# Patient Record
Sex: Male | Born: 1980 | Race: White | Hispanic: No | Marital: Single | Smoking: Never smoker
Health system: Southern US, Community
[De-identification: ages and names within clinical notes are randomized; demographics above are authoritative.]

## PROBLEM LIST (undated history)

## (undated) ENCOUNTER — Ambulatory Visit

## (undated) ENCOUNTER — Encounter: Payer: PRIVATE HEALTH INSURANCE | Attending: Psychosomatic Medicine | Primary: Psychosomatic Medicine

## (undated) ENCOUNTER — Encounter

## (undated) ENCOUNTER — Encounter: Attending: Psychosomatic Medicine | Primary: Psychosomatic Medicine

## (undated) ENCOUNTER — Non-Acute Institutional Stay: Payer: PRIVATE HEALTH INSURANCE

## (undated) ENCOUNTER — Telehealth

## (undated) ENCOUNTER — Encounter
Payer: PRIVATE HEALTH INSURANCE | Attending: Orthopaedic Surgery of the Spine | Primary: Orthopaedic Surgery of the Spine

## (undated) ENCOUNTER — Encounter: Attending: Internal Medicine | Primary: Internal Medicine

## (undated) ENCOUNTER — Ambulatory Visit: Attending: Pharmacist | Primary: Pharmacist

## (undated) ENCOUNTER — Encounter
Attending: Student in an Organized Health Care Education/Training Program | Primary: Student in an Organized Health Care Education/Training Program

## (undated) ENCOUNTER — Ambulatory Visit: Payer: PRIVATE HEALTH INSURANCE

## (undated) ENCOUNTER — Encounter: Attending: Psychiatric/Mental Health | Primary: Psychiatric/Mental Health

## (undated) ENCOUNTER — Encounter: Payer: PRIVATE HEALTH INSURANCE | Attending: Psychiatric/Mental Health | Primary: Psychiatric/Mental Health

## (undated) ENCOUNTER — Encounter: Payer: PRIVATE HEALTH INSURANCE | Attending: Pharmacist | Primary: Pharmacist

## (undated) ENCOUNTER — Telehealth
Attending: Student in an Organized Health Care Education/Training Program | Primary: Student in an Organized Health Care Education/Training Program

## (undated) DIAGNOSIS — F32A Depression, unspecified: Secondary | ICD-10-CM

## (undated) DIAGNOSIS — F419 Anxiety disorder, unspecified: Secondary | ICD-10-CM

## (undated) DIAGNOSIS — F329 Major depressive disorder, single episode, unspecified: Secondary | ICD-10-CM

## (undated) DIAGNOSIS — K859 Acute pancreatitis without necrosis or infection, unspecified: Secondary | ICD-10-CM

## (undated) DIAGNOSIS — F1010 Alcohol abuse, uncomplicated: Secondary | ICD-10-CM

## (undated) HISTORY — PX: CHOLECYSTECTOMY: SHX55

## (undated) HISTORY — PX: NASAL SINUS SURGERY: SHX719

---
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
## 2013-10-01 ENCOUNTER — Encounter (HOSPITAL_COMMUNITY): Payer: Self-pay | Admitting: Emergency Medicine

## 2013-10-01 ENCOUNTER — Inpatient Hospital Stay (HOSPITAL_COMMUNITY)
Admission: EM | Admit: 2013-10-01 | Discharge: 2013-10-05 | DRG: 439 | Disposition: A | Discharge status: Home / Self Care | Payer: Self-pay | Attending: Family Medicine | Admitting: Family Medicine

## 2013-10-01 ENCOUNTER — Emergency Department (HOSPITAL_COMMUNITY): Payer: BC Managed Care – PPO

## 2013-10-01 DIAGNOSIS — F319 Bipolar disorder, unspecified: Secondary | ICD-10-CM

## 2013-10-01 DIAGNOSIS — F1010 Alcohol abuse, uncomplicated: Secondary | ICD-10-CM

## 2013-10-01 DIAGNOSIS — K859 Acute pancreatitis without necrosis or infection, unspecified: Principal | ICD-10-CM

## 2013-10-01 DIAGNOSIS — Z5900 Homelessness unspecified: Secondary | ICD-10-CM

## 2013-10-01 DIAGNOSIS — K861 Other chronic pancreatitis: Secondary | ICD-10-CM

## 2013-10-01 DIAGNOSIS — R109 Unspecified abdominal pain: Secondary | ICD-10-CM

## 2013-10-01 DIAGNOSIS — F1021 Alcohol dependence, in remission: Secondary | ICD-10-CM

## 2013-10-01 DIAGNOSIS — Z23 Encounter for immunization: Secondary | ICD-10-CM

## 2013-10-01 DIAGNOSIS — K5289 Other specified noninfective gastroenteritis and colitis: Secondary | ICD-10-CM | POA: Diagnosis present

## 2013-10-01 DIAGNOSIS — F1110 Opioid abuse, uncomplicated: Secondary | ICD-10-CM

## 2013-10-01 DIAGNOSIS — F332 Major depressive disorder, recurrent severe without psychotic features: Secondary | ICD-10-CM | POA: Diagnosis present

## 2013-10-01 HISTORY — DX: Cystic fibrosis, unspecified: E84.9

## 2013-10-01 HISTORY — DX: Acute pancreatitis without necrosis or infection, unspecified: K85.90

## 2013-10-01 HISTORY — DX: Alcohol abuse, uncomplicated: F10.10

## 2013-10-01 LAB — CBC WITH DIFFERENTIAL/PLATELET
Basophils Absolute: 0 10*3/uL (ref 0.0–0.1)
Basophils Relative: 0 % (ref 0–1)
EOS ABS: 0.1 10*3/uL (ref 0.0–0.7)
EOS PCT: 0 % (ref 0–5)
HEMATOCRIT: 41.3 % (ref 39.0–52.0)
Hemoglobin: 14.8 g/dL (ref 13.0–17.0)
LYMPHS ABS: 1.6 10*3/uL (ref 0.7–4.0)
LYMPHS PCT: 14 % (ref 12–46)
MCH: 31.8 pg (ref 26.0–34.0)
MCHC: 35.8 g/dL (ref 30.0–36.0)
MCV: 88.6 fL (ref 78.0–100.0)
MONO ABS: 0.5 10*3/uL (ref 0.1–1.0)
Monocytes Relative: 4 % (ref 3–12)
Neutro Abs: 9.6 10*3/uL — ABNORMAL HIGH (ref 1.7–7.7)
Neutrophils Relative %: 82 % — ABNORMAL HIGH (ref 43–77)
Platelets: 222 10*3/uL (ref 150–400)
RBC: 4.66 MIL/uL (ref 4.22–5.81)
RDW: 12.4 % (ref 11.5–15.5)
WBC: 11.6 10*3/uL — ABNORMAL HIGH (ref 4.0–10.5)

## 2013-10-01 LAB — URINALYSIS, ROUTINE W REFLEX MICROSCOPIC
Bilirubin Urine: NEGATIVE
Glucose, UA: 500 mg/dL — AB
HGB URINE DIPSTICK: NEGATIVE
Ketones, ur: NEGATIVE mg/dL
NITRITE: NEGATIVE
PROTEIN: NEGATIVE mg/dL
SPECIFIC GRAVITY, URINE: 1.026 (ref 1.005–1.030)
UROBILINOGEN UA: 1 mg/dL (ref 0.0–1.0)
pH: 6.5 (ref 5.0–8.0)

## 2013-10-01 LAB — COMPREHENSIVE METABOLIC PANEL
ALT: 25 U/L (ref 0–53)
AST: 22 U/L (ref 0–37)
Albumin: 4.3 g/dL (ref 3.5–5.2)
Alkaline Phosphatase: 133 U/L — ABNORMAL HIGH (ref 39–117)
BUN: 7 mg/dL (ref 6–23)
CO2: 24 meq/L (ref 19–32)
CREATININE: 0.71 mg/dL (ref 0.50–1.35)
Calcium: 9.4 mg/dL (ref 8.4–10.5)
Chloride: 100 mEq/L (ref 96–112)
GLUCOSE: 206 mg/dL — AB (ref 70–99)
Potassium: 4 mEq/L (ref 3.7–5.3)
SODIUM: 137 meq/L (ref 137–147)
Total Bilirubin: 0.7 mg/dL (ref 0.3–1.2)
Total Protein: 7.4 g/dL (ref 6.0–8.3)

## 2013-10-01 LAB — LIPASE, BLOOD: LIPASE: 7 U/L — AB (ref 11–59)

## 2013-10-01 LAB — URINE MICROSCOPIC-ADD ON

## 2013-10-01 MED ORDER — ACETAMINOPHEN 325 MG PO TABS: 650.0000 mg | ORAL_TABLET | Freq: Four times a day (QID) | ORAL | Status: DC | PRN

## 2013-10-01 MED ORDER — HYDROMORPHONE HCL PF 1 MG/ML IJ SOLN
1.0000 mg | INTRAMUSCULAR | Status: DC | PRN
  Administered 2013-10-02 – 2013-10-05 (×28): 1 mg via INTRAVENOUS
  Filled 2013-10-01 (×28): qty 1

## 2013-10-01 MED ORDER — HYDROMORPHONE HCL PF 1 MG/ML IJ SOLN
1.0000 mg | Freq: Once | INTRAMUSCULAR | Status: AC
  Administered 2013-10-01: 1 mg via INTRAVENOUS
  Filled 2013-10-01: qty 1

## 2013-10-01 MED ORDER — SODIUM CHLORIDE 0.9 % IV BOLUS (SEPSIS)
1000.0000 mL | Freq: Once | INTRAVENOUS | Status: AC
  Administered 2013-10-01: 1000 mL via INTRAVENOUS

## 2013-10-01 MED ORDER — PROMETHAZINE HCL 25 MG/ML IJ SOLN
12.5000 mg | Freq: Once | INTRAMUSCULAR | Status: AC
  Administered 2013-10-01: 12.5 mg via INTRAVENOUS
  Filled 2013-10-01: qty 1

## 2013-10-01 MED ORDER — HYDROMORPHONE HCL PF 1 MG/ML IJ SOLN: 1.0000 mg | Freq: Once | INTRAMUSCULAR | Status: DC

## 2013-10-01 MED ORDER — ONDANSETRON HCL 4 MG PO TABS: 4.0000 mg | ORAL_TABLET | Freq: Four times a day (QID) | ORAL | Status: DC | PRN

## 2013-10-01 MED ORDER — ONDANSETRON HCL 4 MG/2ML IJ SOLN
4.0000 mg | Freq: Once | INTRAMUSCULAR | Status: AC
  Administered 2013-10-01: 4 mg via INTRAVENOUS
  Filled 2013-10-01: qty 2

## 2013-10-01 MED ORDER — ONDANSETRON HCL 4 MG/2ML IJ SOLN
4.0000 mg | Freq: Four times a day (QID) | INTRAMUSCULAR | Status: DC | PRN
  Filled 2013-10-01: qty 2

## 2013-10-01 MED ORDER — ONDANSETRON HCL 4 MG/2ML IJ SOLN: 4.0000 mg | Freq: Once | INTRAMUSCULAR | Status: DC

## 2013-10-01 MED ORDER — SODIUM CHLORIDE 0.9 % IV BOLUS (SEPSIS): 1000.0000 mL | Freq: Once | INTRAVENOUS | Status: DC

## 2013-10-01 MED ORDER — SODIUM CHLORIDE 0.9 % IV SOLN
INTRAVENOUS | Status: AC
  Administered 2013-10-02: via INTRAVENOUS

## 2013-10-01 MED ORDER — ACETAMINOPHEN 650 MG RE SUPP: 650.0000 mg | Freq: Four times a day (QID) | RECTAL | Status: DC | PRN

## 2013-10-01 NOTE — ED Provider Notes (Signed)
  This was a shared visit with a mid-level provided (NP or PA).  Throughout the patient's course I was available for consultation/collaboration.  I saw the ECG (if appropriate), relevant labs and studies - I agree with the interpretation.  On my exam the patient was very uncomfortable appearing.  His parents and I had a lengthy conversation about his Hx, and we obtained his recent CT results from another hospital. Given the persistent pain, inability to tolerate PO, he required admission with anticipated assistance with substance dependency when more medically stable.      Gerhard Munchobert Kaycie Pegues, MD 10/01/13 [phone]

## 2013-10-01 NOTE — ED Provider Notes (Signed)
CSN: [id number]     Arrival date & time 10/01/13  1633 History   First MD Initiated Contact with Patient 10/01/13 1821     Chief Complaint  Patient presents with  . Abdominal Pain     (Consider location/radiation/quality/duration/timing/severity/associated sxs/prior Treatment) The history is provided by the patient, a caregiver and medical records.   This is a 33 y.o. M with PMH significant for cystic fibrosis, chronic pancreatitis, presenting to the ED for abdominal pain.  Pt states he was just released from [facility] on Thursday after an admission for bout of pancreatitis.  States he has done much better at time of discharge, was able to eat without nausea or vomiting. States he started feeling bad again yesterday, was again seen a [facility] but was discharged after receiving pain meds. Patient states he ate red lobster earlier today, soon after he developed severe epigastric abdominal pain associated with nausea and nonbloody, nonbilious emesis.  Patient's mother is at bedside who acknowledges that patient has a history of alcohol abuse and prescription drug abuse over the past 17 months.  She states it started when he lost his job and his fiance left him and has since spiraled out of control. Per mother has had multiple ED visits for the same, has visited almost every hospital in [place].  She states she has had multiple shipments of Dilaudid and Phenergan shipped to him from other countries.  She also admits that he attended suicide by overdosing last month, he was involuntarily committed at that time. Since discharge from facility he is refused all psychiatric care offered to him including counseling.  Pt states he has had no EtOH intake since March 31st.  Prior cholecystectomy.    Past Medical History  Diagnosis Date  . Pancreatitis   . Cystic fibrosis    Past Surgical History  Procedure Laterality Date  . Cholecystectomy     No family history on  file. History  Substance Use Topics  . Smoking status: Never Smoker   . Smokeless tobacco: Not on file  . Alcohol Use: No    Review of Systems    Allergies  Review of patient's allergies indicates no known allergies.  Home Medications   Prior to Admission medications   Not on File   BP 149/108  Pulse 74  Temp(Src) 98.3 F (36.8 C) (Oral)  Resp 18  Ht 6' (1.829 m)  Wt 160 lb (72.576 kg)  BMI 21.70 kg/m2  SpO2 99%  Physical Exam  Nursing note and vitals reviewed. Constitutional: He is oriented to person, place, and time. He appears well-developed and well-nourished.  Appears uncomfortable  HENT:  Head: Normocephalic and atraumatic.  Mouth/Throat: Oropharynx is clear and moist.  Eyes: Conjunctivae and EOM are normal. Pupils are equal, round, and reactive to light.  Neck: Normal range of motion.  Cardiovascular: Normal rate, regular rhythm and normal heart sounds.   Pulmonary/Chest: Effort normal and breath sounds normal.  Abdominal: Soft. Bowel sounds are normal. There is tenderness in the epigastric area. There is no CVA tenderness, no tenderness at McBurney's point and negative Murphy's sign.  Musculoskeletal: Normal range of motion.  Neurological: He is alert and oriented to person, place, and time.  Skin: Skin is warm and dry.  Psychiatric: He has a normal mood and affect. He is not actively hallucinating. He expresses no homicidal and no suicidal ideation. He expresses no suicidal plans and no homicidal plans.  Denies SI/HI/AVH    ED  Course  Procedures (including critical care time) Labs Review Labs Reviewed  CBC WITH DIFFERENTIAL - Abnormal; Notable for the following:    WBC 11.6 (*)    Neutrophils Relative % 82 (*)    Neutro Abs 9.6 (*)    All other components within normal limits  COMPREHENSIVE METABOLIC PANEL - Abnormal; Notable for the following:    Glucose, Bld 206 (*)    Alkaline Phosphatase 133 (*)    All other components within normal limits   LIPASE, BLOOD - Abnormal; Notable for the following:    Lipase 7 (*)    All other components within normal limits  URINALYSIS, ROUTINE W REFLEX MICROSCOPIC    Imaging Review No results found.   EKG Interpretation None      MDM   Final diagnoses:  Chronic pancreatitis  Cystic fibrosis  Alcohol abuse  Narcotic abuse   [facility], on admission pt had leukocytosis of 20,000, lipase was 7.  CT performed last week on 09/25/13 was negative for acute findings aside from pancreatic inflammation.  He was admitted for 4 days-- pain was controlled and able to tolerate PO solids prior to discharge.  Labs today appear improved from previous. Long discussion with pt and family-- pt is willing to undergo psychiatric evaluation.  Will plan admission for pain control and possible rehab placement.  Pt denies SI/HI/AVH at this time.  Discussed case with Dr. Toniann FailKakrakandy who agrees to admit to med-surg.  Temp admission orders placed.  VS remain stable.  Garlon HatchetLisa M Keawe Marcello, PA-C 10/01/13 2324  Garlon HatchetLisa M Cashis Rill, PA-C 10/01/13 2325

## 2013-10-01 NOTE — H&P (Signed)
[facility] History and Physical  [name] ZOX:[id number] DOB: 12/28/1980 DOA: 10/01/2013  Referring physician: ER physician. PCP: PROVIDER NOT IN SYSTEM   Chief Complaint: Abdominal pain.  HPI: [name] is a [age] y.o. male history of cystic fibrosis and chronic pancreatitis presented to the ER because of abdominal pain. Patient was admitted last week at [facility] with complaints of abdominal pain. 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 Patient was discharged. Patient is presently homeless. Since patient was unable to keep anything due to the recurrence of pain patient [facility]. In the ER labs show elevated blood sugar and alkaline phosphatase. Patient states that his pain has worsened from yesterday. Has had nausea vomiting and has chronic diarrhea from pancreatitis. Pain is mostly located in the epigastric area constant. Denies any chest pain or shortness of breath. Patient is scheduled to [facility] month. Patient has been [facility] for depression and suicidal ideation. Presently denies any suicidal ideation or homicidal ideations. Patient has had previous history of drug abuse. Patient also has history of alcoholism and has not had any alcohol since March 31 3 weeks ago.  Review of Systems: As presented in the history of presenting illness, rest negative.  Past Medical History  Diagnosis Date  . Pancreatitis   . Cystic fibrosis   . Alcohol abuse    Past Surgical History  Procedure Laterality Date  . Cholecystectomy    . Nasal sinus surgery     Social History:  reports that he has never smoked. He does not have any smokeless tobacco history on file. He reports that he does not drink alcohol or use illicit drugs. Where does patient live homeless. Can patient participate in ADLs?  Yes.  No Known Allergies  Family History:  Family History  Problem Relation Age of Onset  . Diabetes Mellitus II Other   . Colon cancer Other       Prior to Admission medications   Not on File    Physical Exam: Filed Vitals:   10/01/13 1823 10/01/13 2104 10/01/13 2115 10/01/13 2301  BP: 149/108 128/85 132/96 128/83  Pulse: 74 70 61 66  Temp: 98.3 F (36.8 C) 98.5 F (36.9 C)    TempSrc: Oral Oral    Resp: 18 20  22   Height:      Weight:      SpO2: 99% 97% 95% 95%     General:  Well-developed and poorly nourished.  Eyes: Anicteric no pallor.  ENT: No discharge from the ears eyes nose mouth.  Neck: No mass felt.  Cardiovascular: S1-S2 heard.  Respiratory: No rhonchi or crepitations.  Abdomen: Soft mild tenderness in the epigastric area. No guarding rigidity.  Skin: No rash.  Musculoskeletal: No edema.  Psychiatric: Appears normal.  Neurologic: Alert awake oriented to time place and person. Moves all extremities.  Labs on Admission:  Basic Metabolic Panel:  Recent Labs Lab 10/01/13 1703  NA 137  K 4.0  CL 100  CO2 24  GLUCOSE 206*  BUN 7  CREATININE 0.71  CALCIUM 9.4   Liver Function Tests:  Recent Labs Lab 10/01/13 1703  AST 22  ALT 25  ALKPHOS 133*  BILITOT 0.7  PROT 7.4  ALBUMIN 4.3    Recent Labs Lab 10/01/13 1703  LIPASE 7*  No results found for this basename: AMMONIA,  in the last 168 hours CBC:  Recent Labs Lab 10/01/13 1703  WBC 11.6*  NEUTROABS 9.6*  HGB 14.8  HCT 41.3  MCV 88.6  PLT 222   Cardiac Enzymes: No results found for this basename: CKTOTAL, CKMB, CKMBINDEX, TROPONINI,  in the last 168 hours  BNP (last 3 results) No results found for this basename: PROBNP,  in the last 8760 hours CBG: No results found for this basename: GLUCAP,  in the last 168 hours  Radiological Exams on Admission: No results found.   Assessment/Plan Active Problems:   Pancreatitis   Abdominal pain   Cystic  fibrosis   1. Abdominal pain with history of chronic pancreatitis in a patient with known history of cystic fibrosis - at this time I have ordered KUB. Patient will be kept on clear with IV fluids and pain relief medications. Consult GI in a.m. Repeat labs in a.m. including lipase. 2. History of depression and polysubstance abuse - check urine drug screen. Patient used to have history of alcoholism and has not had any alcohol for last 3 weeks and will place patient on IV thiamine. Consult psychiatry in a.m. 3. History of cystic fibrosis.    Code Status: Full code.  Family Communication: Patient's parents.  Disposition Plan: Admit to inpatient.    Eduard ClosArshad N Amado Andal [facility] Pager [phone].  If 7PM-7AM, please contact night-coverage [email or web address] Password [email or web address] 10/01/2013, 11:03 PM

## 2013-10-01 NOTE — ED Notes (Signed)
Pt presents to department for evaluation of diffuse abdominal pain. History of pancreatitis. States he was recently discharged from [facility] on Thursday. 10/10 pain upon arrival to ED. Also states nausea/vomiting. Pt is alert and oriented x4.

## 2013-10-01 NOTE — ED Notes (Signed)
[facility] at bedside.

## 2013-10-01 NOTE — ED Notes (Addendum)
Mother states patient is a heavy drinker, also states prescription drug abuse. Pt denies at the time.

## 2013-10-02 DIAGNOSIS — F3160 Bipolar disorder, current episode mixed, unspecified: Secondary | ICD-10-CM

## 2013-10-02 DIAGNOSIS — F332 Major depressive disorder, recurrent severe without psychotic features: Secondary | ICD-10-CM

## 2013-10-02 DIAGNOSIS — K861 Other chronic pancreatitis: Secondary | ICD-10-CM

## 2013-10-02 LAB — GLUCOSE, CAPILLARY
GLUCOSE-CAPILLARY: 104 mg/dL — AB (ref 70–99)
Glucose-Capillary: 130 mg/dL — ABNORMAL HIGH (ref 70–99)
Glucose-Capillary: 107 mg/dL — ABNORMAL HIGH (ref 70–99)
Glucose-Capillary: 94 mg/dL (ref 70–99)
Glucose-Capillary: 89 mg/dL (ref 70–99)

## 2013-10-02 LAB — RAPID URINE DRUG SCREEN, HOSP PERFORMED
AMPHETAMINES: NOT DETECTED
BENZODIAZEPINES: NOT DETECTED
Barbiturates: NOT DETECTED
COCAINE: NOT DETECTED
Opiates: NOT DETECTED
Tetrahydrocannabinol: NOT DETECTED

## 2013-10-02 LAB — COMPREHENSIVE METABOLIC PANEL
ALT: 18 U/L (ref 0–53)
AST: 15 U/L (ref 0–37)
Albumin: 3.3 g/dL — ABNORMAL LOW (ref 3.5–5.2)
Alkaline Phosphatase: 114 U/L (ref 39–117)
BUN: 6 mg/dL (ref 6–23)
CO2: 24 meq/L (ref 19–32)
Calcium: 8.2 mg/dL — ABNORMAL LOW (ref 8.4–10.5)
Chloride: 106 mEq/L (ref 96–112)
Creatinine, Ser: 0.73 mg/dL (ref 0.50–1.35)
GFR calc Af Amer: >90 mL/min (ref >90)
Glucose, Bld: 137 mg/dL — ABNORMAL HIGH (ref 70–99)
Potassium: 3.7 mEq/L (ref 3.7–5.3)
SODIUM: 142 meq/L (ref 137–147)
Total Bilirubin: 0.5 mg/dL (ref 0.3–1.2)
Total Protein: 5.9 g/dL — ABNORMAL LOW (ref 6.0–8.3)

## 2013-10-02 LAB — CBC WITH DIFFERENTIAL/PLATELET
BASOS ABS: 0 10*3/uL (ref 0.0–0.1)
Basophils Relative: 0 % (ref 0–1)
Eosinophils Absolute: 0.1 10*3/uL (ref 0.0–0.7)
Eosinophils Relative: 2 % (ref 0–5)
HCT: 34.9 % — ABNORMAL LOW (ref 39.0–52.0)
HEMOGLOBIN: 12.6 g/dL — AB (ref 13.0–17.0)
LYMPHS ABS: 2 10*3/uL (ref 0.7–4.0)
LYMPHS PCT: 35 % (ref 12–46)
MCH: 32.1 pg (ref 26.0–34.0)
MCHC: 36.1 g/dL — AB (ref 30.0–36.0)
MCV: 88.8 fL (ref 78.0–100.0)
MONO ABS: 0.4 10*3/uL (ref 0.1–1.0)
Monocytes Relative: 6 % (ref 3–12)
NEUTROS ABS: 3.3 10*3/uL (ref 1.7–7.7)
Neutrophils Relative %: 56 % (ref 43–77)
Platelets: 148 10*3/uL — ABNORMAL LOW (ref 150–400)
RBC: 3.93 MIL/uL — ABNORMAL LOW (ref 4.22–5.81)
RDW: 12.5 % (ref 11.5–15.5)
WBC: 5.8 10*3/uL (ref 4.0–10.5)

## 2013-10-02 LAB — HEMOGLOBIN A1C
Hgb A1c MFr Bld: 6.4 % — ABNORMAL HIGH (ref <5.7)
Mean Plasma Glucose: 137 mg/dL — ABNORMAL HIGH (ref <117)

## 2013-10-02 LAB — LIPASE, BLOOD: Lipase: 6 U/L — ABNORMAL LOW (ref 11–59)

## 2013-10-02 MED ORDER — TETANUS-DIPHTH-ACELL PERTUSSIS 5-2.5-18.5 LF-MCG/0.5 IM SUSP
0.5000 mL | Freq: Once | INTRAMUSCULAR | Status: AC
  Administered 2013-10-03: 0.5 mL via INTRAMUSCULAR
  Filled 2013-10-02 (×2): qty 0.5

## 2013-10-02 MED ORDER — PNEUMOCOCCAL VAC POLYVALENT 25 MCG/0.5ML IJ INJ
0.5000 mL | INJECTION | INTRAMUSCULAR | Status: AC
  Administered 2013-10-03: 0.5 mL via INTRAMUSCULAR
  Filled 2013-10-02: qty 0.5

## 2013-10-02 MED ORDER — PROMETHAZINE HCL 25 MG/ML IJ SOLN
12.5000 mg | Freq: Four times a day (QID) | INTRAMUSCULAR | Status: DC | PRN
  Administered 2013-10-02 – 2013-10-05 (×14): 12.5 mg via INTRAVENOUS
  Filled 2013-10-02 (×14): qty 1

## 2013-10-02 MED ORDER — THIAMINE HCL 100 MG/ML IJ SOLN
100.0000 mg | Freq: Every day | INTRAMUSCULAR | Status: DC
  Administered 2013-10-02 – 2013-10-05 (×4): 100 mg via INTRAVENOUS
  Filled 2013-10-02 (×5): qty 1

## 2013-10-02 NOTE — Progress Notes (Addendum)
[facility]                                                                              Patient Demographics  [name], is a 33 y.o. male, DOB - Aug 27, 1980, [id number]  Admit date - 10/01/2013   Admitting Physician Rise Patience, MD  Outpatient Primary MD for the patient is PROVIDER NOT [place]  LOS - 1   Chief Complaint  Patient presents with  . Abdominal Pain      HPI: [name] is a 33 y.o. male history of cystic fibrosis and chronic pancreatitis presented to the ER because of abdominal pain. Patient was admitted last week at [facility] with complaints of abdominal pain. 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 Patient was discharged. Patient is presently homeless. Since patient was unable to keep anything due to the recurrence of pain patient came to the ER at [facility]. In the ER labs show elevated blood sugar and alkaline phosphatase. Patient states that his pain has worsened from yesterday. Has had nausea vomiting and has chronic diarrhea from pancreatitis. Pain is mostly located in the epigastric area constant. Denies any chest pain or shortness of breath. Patient is scheduled to followup with [facility] next month. Patient has been admitted last month at [facility] for depression and suicidal ideation. Presently denies any suicidal ideation or homicidal ideations. Patient has had previous history of drug abuse.   Assessment & Plan   Abdominal pain with history of chronic pancreatitis -Patient does have a history of cystic fibrosis -KUB: Nonobstructive bowel gas pattern -Continue IV fluids and pain control -Gastroenterology consulted -Alk phos was elevated at 133 (admission), trending downward to 114  History of depression and polysubstance abuse -Patient states he is not had any alcohol since this 09/12/2013 -Was  recently discharged from [place] unit in March 2015 for suicide attempt -At this time not suicidal -Will consult psychiatry -Toxicology screen negative -Continue thiamine  History of cystic fibrosis -Patient will need outpatient monitoring and follow.  Code Status: Full  Family Communication: None at bedside  Disposition Plan: Admitted  Time Spent in minutes   30 minutes  Procedures none  Consults   Gastroenterology Psychiatry  DVT Prophylaxis  SCDs  Lab Results  Component Value Date   PLT 148* 10/02/2013    Medications  Scheduled Meds: . [START ON 10/03/2013] pneumococcal 23 valent vaccine  0.5 mL Intramuscular Tomorrow-1000  . thiamine IV  100 mg Intravenous Daily   Continuous Infusions: . sodium chloride 75 mL/hr at 10/02/13 0017   PRN Meds:.acetaminophen, acetaminophen, HYDROmorphone (DILAUDID) injection, ondansetron (ZOFRAN) IV, ondansetron, promethazine  Antibiotics    Anti-infectives   None      Subjective:   Wilkin Myrick seen and examined today. Patient continues to have abdominal pain, however improved since staying admitted. Continues to have some nausea. Patient admits to being depressed.  Objective:   Filed Vitals:   10/01/13 2115 10/01/13 2301 10/02/13 0012 10/02/13 0613  BP: 132/96 128/83 137/84 125/89  Pulse: 61 66 56 54  Temp:   97.8 F (36.6 C) 97.4 F (36.3 C)  TempSrc:      Resp:  _0 Height:      Weight:      SpO2: 95% 95% 97% 96%    Wt Readings from Last 3 Encounters:  10/01/13 72.576 kg (160 lb)    No intake or output data in the 24 hours ending 10/02/13 0848  Exam  General: Well developed, thin, NAD, appears stated age  HEENT: NCAT, PERRLA, EOMI, Anicteic Sclera, mucous membranes moist.  Neck: Supple, no JVD, no masses  Cardiovascular: S1 S2 auscultated, no rubs, murmurs or gallops. Regular rate and rhythm.  Respiratory: Clear to auscultation bilaterally with equal chest rise  Abdomen: Soft,  epigastric tenderness, nondistended, + bowel sounds  Extremities: warm dry without cyanosis clubbing or edema  Neuro: AAOx3, cranial nerves grossly intact. Strength 5/5 in patient's upper and lower extremities bilaterally  Skin: Without rashes exudates or nodules  Psych: Normal affect and demeanor with intact judgement and insight   Data Review   Micro Results No results found for this or any previous visit (from the past 240 hour(s)).  Radiology Reports Dg Chest 2 View  10/01/2013   CLINICAL DATA:  Cystic fibrosis  EXAM: CHEST - 2 VIEW  COMPARISON:  None.  FINDINGS: Lungs are hyper aerated. Linear opacities are seen throughout both central lung zones. Patchy densities are seen in the upper lung zones and central lower lung zones. An inflammatory process is not excluded. Normal heart size. No pneumothorax.  IMPRESSION: Hyperaeration and linear opacities consistent with cystic fibrosis.  Patchy bilateral opacities are noted and an inflammatory process cannot be excluded without a comparison with prior studies.   Electronically Signed   By: Maryclare Bean M.D.   On: 10/01/2013 23:20   Dg Abd 1 View  10/01/2013   CLINICAL DATA:  Abdominal pain  EXAM: ABDOMEN - 1 VIEW  COMPARISON:  None.  FINDINGS: No disproportionate dilatation of bowel. No obvious free intraperitoneal gas. Moderate stool burden in the colon.  IMPRESSION: Nonobstructive bowel gas pattern.   Electronically Signed   By: Maryclare Bean M.D.   On: 10/01/2013 23:18    CBC  Recent Labs Lab 10/01/13 1703 10/02/13 0403  WBC 11.6* 5.8  HGB 14.8 12.6*  HCT 41.3 34.9*  PLT 222 148*  MCV 88.6 88.8  MCH 31.8 32.1  MCHC 35.8 36.1*  RDW 12.4 12.5  LYMPHSABS 1.6 2.0  MONOABS 0.5 0.4  EOSABS 0.1 0.1  BASOSABS 0.0 0.0    Chemistries   Recent Labs Lab 10/01/13 1703 10/02/13 0403  NA 137 142  K 4.0 3.7  CL 100 106  CO2 24 24  GLUCOSE 206* 137*  BUN 7 6  CREATININE 0.71 0.73  CALCIUM 9.4 8.2*  AST 22 15  ALT 25 18  ALKPHOS  133* 114  BILITOT 0.7 0.5   ------------------------------------------------------------------------------------------------------------------ estimated creatinine clearance is 138.6 ml/min (by C-G formula based on Cr of 0.73). ------------------------------------------------------------------------------------------------------------------ No results found for this basename: HGBA1C,  in the last 72 hours ------------------------------------------------------------------------------------------------------------------ No results found for this basename: CHOL, HDL, LDLCALC, TRIG, CHOLHDL, LDLDIRECT,  in the last 72 hours ------------------------------------------------------------------------------------------------------------------ No results found for this basename: TSH, T4TOTAL, FREET3, T3FREE, THYROIDAB,  in the last 72 hours ------------------------------------------------------------------------------------------------------------------ No results found for this basename: VITAMINB12, FOLATE, FERRITIN, TIBC, IRON, RETICCTPCT,  in the last 72 hours  Coagulation profile No results found for this basename: INR, PROTIME,  in the last 168 hours  No results found for this basename: DDIMER,  in the last 72 hours  Cardiac Enzymes No results found for this basename: CK, CKMB, TROPONINI, MYOGLOBIN,  in the last 168 hours ------------------------------------------------------------------------------------------------------------------ No components found with this basename: POCBNP,     Braylea Brancato D.O. on 10/02/2013 at 8:48 AM  Between 7am to 7pm - Pager - [phone]  After 7pm go to [email or web address] - password [email or web address]  And look for the night coverage person covering for me after hours  [facility] Office  [phone]

## 2013-10-02 NOTE — Consult Note (Signed)
[name] Face-to-Face Psychiatry Consult   Reason for Consult:  Depression Referring Physician:  Dr Starlyn Skeans [name] is an 33 y.o. male. Total Time spent with patient: 20 minutes  Assessment: AXIS I:  Bipolar, mixed and Major Depression, Recurrent severe AXIS II:  Deferred AXIS III:   Past Medical History  Diagnosis Date  . Pancreatitis   . Cystic fibrosis   . Alcohol abuse    AXIS IV:  economic problems, other psychosocial or environmental problems, problems related to social environment, problems with access to health care services and problems with primary support group AXIS V:  51-60 moderate symptoms  Plan:  No evidence of imminent risk to self or others at present.   Patient does not meet criteria for psychiatric inpatient admission. Supportive therapy provided about ongoing stressors. Discussed crisis plan, support from social network, calling 911, coming to the Emergency Department, and calling Suicide Hotline.  Subjective:   [name] is a 33 y.o. male patient admitted with abdominal pain patient seen chart reviewed.  Patient is 33 year-old Caucasian unemployed single man who was admitted on the medical floor because of abdominal pain.  Consult was called because patient has history of depression and suicidal attempt in recent months the patient told that he was admitted to [facility] for 22 days after taking overdose on Xanax.  Patient told he was given Zoloft but did not help and he was later transferred to [facility] for 5 days.  Patient told he took Xanax overdose because he was feeling really sad depressed and very upset on his girlfriend who has in November last year.  Patient told she ended the relationship because of his alcohol problem.  Patient admitted history of heavy drinking in the past which has caused problems at work, and relationship and recently financially.  The patient is also going through multiple health issues and he has difficulty coping with his  cystic fibrosis.  Patient endorses history of mood swing for long time and recently has been noticed irritable, lack of sleep, racing thoughts, getting easily angry, anhedonia and anger issues the patient told his mother has type I disorder.  He has never tried bipolar medication.  Denies any paranoia or any hallucinations but admitted highs and lows in his mood.  In the past he had tried Celexa and Zoloft with limited response.  He was given Seroquel when he was at [facility] but he developed nightmares and bad dreams.  Patient denies any suicidal thoughts or homicidal thoughts at this time however he wants to try a mood stabilizer to help his mood lability anger insomnia and racing thoughts.  Patient claims to be sober since March 31.  He is currently living with his parents.  Patient admitted not able to followup with his release from [facility].  Patient denies any drug use.  The patient used to see a psychiatrist in [place] who was given Xanax 0.5 mg 1 tablet up to 4 times a day however he has not seen his psychiatrist more than a year.  He was getting refills without followup appointments.    Past Psychiatric History: Past Medical History  Diagnosis Date  . Pancreatitis   . Cystic fibrosis   . Alcohol abuse     reports that he has never smoked. He does not have any smokeless tobacco history on file. He reports that he does not drink alcohol or use illicit drugs. Family History  Problem Relation Age of Onset  . Diabetes Mellitus  II Other   . Colon cancer Other      Living Arrangements: Parent   Abuse/Neglect [facility]) Physical Abuse: Denies Verbal Abuse: Denies Sexual Abuse: Denies Allergies:  No Known Allergies  ACT Assessment Complete:  No:   Past Psychiatric History: Diagnosis:  Depression, possible bipolar disorder   Hospitalizations:  [facility], [facility]  Outpatient Care:  None   Substance Abuse Care:  History of alcohol   Self-Mutilation:  Denies   Suicidal  Attempts:  Yes   Homicidal Behaviors:  Denies    Violent Behaviors:  History of mood swings and anger    Place of Residence:  Lives with his parents Marital Status:  Single Employed/Unemployed:  Currently unemployed Education:  Secretary/administrator education Family Supports:  Yes  Objective: Blood pressure 127/90, pulse 58, temperature 97.7 F (36.5 C), temperature source Oral, resp. rate 20, height 6' (1.829 m), weight 160 lb (72.576 kg), SpO2 97.00%.Body mass index is 21.7 kg/(m^2). Results for orders placed during the hospital encounter of 10/01/13 (from the past 72 hour(s))  CBC WITH DIFFERENTIAL     Status: Abnormal   Collection Time    10/01/13  5:03 PM      Result Value Ref Range   WBC 11.6 (*) 4.0 - 10.5 K/uL   RBC 4.66  4.22 - 5.81 MIL/uL   Hemoglobin 14.8  13.0 - 17.0 g/dL   HCT 41.3  39.0 - 52.0 %   MCV 88.6  78.0 - 100.0 fL   MCH 31.8  26.0 - 34.0 pg   MCHC 35.8  30.0 - 36.0 g/dL   RDW 12.4  11.5 - 15.5 %   Platelets 222  150 - 400 K/uL   Neutrophils Relative % 82 (*) 43 - 77 %   Neutro Abs 9.6 (*) 1.7 - 7.7 K/uL   Lymphocytes Relative 14  12 - 46 %   Lymphs Abs 1.6  0.7 - 4.0 K/uL   Monocytes Relative 4  3 - 12 %   Monocytes Absolute 0.5  0.1 - 1.0 K/uL   Eosinophils Relative 0  0 - 5 %   Eosinophils Absolute 0.1  0.0 - 0.7 K/uL   Basophils Relative 0  0 - 1 %   Basophils Absolute 0.0  0.0 - 0.1 K/uL  COMPREHENSIVE METABOLIC PANEL     Status: Abnormal   Collection Time    10/01/13  5:03 PM      Result Value Ref Range   Sodium 137  137 - 147 mEq/L   Potassium 4.0  3.7 - 5.3 mEq/L   Chloride 100  96 - 112 mEq/L   CO2 24  19 - 32 mEq/L   Glucose, Bld 206 (*) 70 - 99 mg/dL   BUN 7  6 - 23 mg/dL   Creatinine, Ser 0.71  0.50 - 1.35 mg/dL   Calcium 9.4  8.4 - 10.5 mg/dL   Total Protein 7.4  6.0 - 8.3 g/dL   Albumin 4.3  3.5 - 5.2 g/dL   AST 22  0 - 37 U/L   ALT 25  0 - 53 U/L   Alkaline Phosphatase 133 (*) 39 - 117 U/L   Total Bilirubin 0.7  0.3 - 1.2 mg/dL   GFR calc  non Af Amer >90  >90 mL/min   GFR calc Af Amer >90  >90 mL/min   Comment: (NOTE)     The eGFR has been calculated using the CKD EPI equation.     This calculation has not been  validated in all clinical situations.     eGFR's persistently <90 mL/min signify possible Chronic Kidney     Disease.  LIPASE, BLOOD     Status: Abnormal   Collection Time    10/01/13  5:03 PM      Result Value Ref Range   Lipase 7 (*) 11 - 59 U/L  URINALYSIS, ROUTINE W REFLEX MICROSCOPIC     Status: Abnormal   Collection Time    10/01/13  9:52 PM      Result Value Ref Range   Color, Urine AMBER (*) YELLOW   Comment: BIOCHEMICALS MAY BE AFFECTED BY COLOR   APPearance CLEAR  CLEAR   Specific Gravity, Urine 1.026  1.005 - 1.030   pH 6.5  5.0 - 8.0   Glucose, UA 500 (*) NEGATIVE mg/dL   Hgb urine dipstick NEGATIVE  NEGATIVE   Bilirubin Urine NEGATIVE  NEGATIVE   Ketones, ur NEGATIVE  NEGATIVE mg/dL   Protein, ur NEGATIVE  NEGATIVE mg/dL   Urobilinogen, UA 1.0  0.0 - 1.0 mg/dL   Nitrite NEGATIVE  NEGATIVE   Leukocytes, UA TRACE (*) NEGATIVE  URINE MICROSCOPIC-ADD ON     Status: None   Collection Time    10/01/13  9:52 PM      Result Value Ref Range   Squamous Epithelial / LPF RARE  RARE   WBC, UA 0-2  <3 WBC/hpf   RBC / HPF 0-2  <3 RBC/hpf  URINE RAPID DRUG SCREEN (HOSP PERFORMED)     Status: None   Collection Time    10/01/13  9:52 PM      Result Value Ref Range   Opiates NONE DETECTED  NONE DETECTED   Cocaine NONE DETECTED  NONE DETECTED   Benzodiazepines NONE DETECTED  NONE DETECTED   Amphetamines NONE DETECTED  NONE DETECTED   Tetrahydrocannabinol NONE DETECTED  NONE DETECTED   Barbiturates NONE DETECTED  NONE DETECTED   Comment:            DRUG SCREEN FOR MEDICAL PURPOSES     ONLY.  IF CONFIRMATION IS NEEDED     FOR ANY PURPOSE, NOTIFY LAB     WITHIN 5 DAYS.                LOWEST DETECTABLE LIMITS     FOR URINE DRUG SCREEN     Drug Class       Cutoff (ng/mL)     Amphetamine      1000      Barbiturate      200     Benzodiazepine   009     Tricyclics       233     Opiates          300     Cocaine          300     THC              50  COMPREHENSIVE METABOLIC PANEL     Status: Abnormal   Collection Time    10/02/13  4:03 AM      Result Value Ref Range   Sodium 142  137 - 147 mEq/L   Potassium 3.7  3.7 - 5.3 mEq/L   Chloride 106  96 - 112 mEq/L   CO2 24  19 - 32 mEq/L   Glucose, Bld 137 (*) 70 - 99 mg/dL   BUN 6  6 - 23 mg/dL   Creatinine, Ser 0.73  0.50 -  1.35 mg/dL   Calcium 8.2 (*) 8.4 - 10.5 mg/dL   Total Protein 5.9 (*) 6.0 - 8.3 g/dL   Albumin 3.3 (*) 3.5 - 5.2 g/dL   AST 15  0 - 37 U/L   ALT 18  0 - 53 U/L   Alkaline Phosphatase 114  39 - 117 U/L   Total Bilirubin 0.5  0.3 - 1.2 mg/dL   GFR calc non Af Amer >90  >90 mL/min   GFR calc Af Amer >90  >90 mL/min   Comment: (NOTE)     The eGFR has been calculated using the CKD EPI equation.     This calculation has not been validated in all clinical situations.     eGFR's persistently <90 mL/min signify possible Chronic Kidney     Disease.  CBC WITH DIFFERENTIAL     Status: Abnormal   Collection Time    10/02/13  4:03 AM      Result Value Ref Range   WBC 5.8  4.0 - 10.5 K/uL   RBC 3.93 (*) 4.22 - 5.81 MIL/uL   Hemoglobin 12.6 (*) 13.0 - 17.0 g/dL   HCT 34.9 (*) 39.0 - 52.0 %   MCV 88.8  78.0 - 100.0 fL   MCH 32.1  26.0 - 34.0 pg   MCHC 36.1 (*) 30.0 - 36.0 g/dL   RDW 12.5  11.5 - 15.5 %   Platelets 148 (*) 150 - 400 K/uL   Comment: REPEATED TO VERIFY     SPECIMEN CHECKED FOR CLOTS   Neutrophils Relative % 56  43 - 77 %   Neutro Abs 3.3  1.7 - 7.7 K/uL   Lymphocytes Relative 35  12 - 46 %   Lymphs Abs 2.0  0.7 - 4.0 K/uL   Monocytes Relative 6  3 - 12 %   Monocytes Absolute 0.4  0.1 - 1.0 K/uL   Eosinophils Relative 2  0 - 5 %   Eosinophils Absolute 0.1  0.0 - 0.7 K/uL   Basophils Relative 0  0 - 1 %   Basophils Absolute 0.0  0.0 - 0.1 K/uL  LIPASE, BLOOD     Status: Abnormal   Collection Time     10/02/13  4:03 AM      Result Value Ref Range   Lipase 6 (*) 11 - 59 U/L  HEMOGLOBIN A1C     Status: Abnormal   Collection Time    10/02/13  4:05 AM      Result Value Ref Range   Hemoglobin A1C 6.4 (*) <5.7 %   Comment: (NOTE)                                                                               According to the ADA Clinical Practice Recommendations for 2011, when     HbA1c is used as a screening test:      >=6.5%   Diagnostic of Diabetes Mellitus               (if abnormal result is confirmed)     5.7-6.4%   Increased risk of developing Diabetes Mellitus     References:Diagnosis and Classification of Diabetes Mellitus,Diabetes  OZDG,6440,34(VQQVZ 1):S62-S69 and Standards of Medical Care in             Diabetes - 2011,Diabetes DGLO,7564,33 (Suppl 1):S11-S61.   Mean Plasma Glucose 137 (*) <117 mg/dL   Comment: Performed at [place], CAPILLARY     Status: Abnormal   Collection Time    10/02/13  4:07 AM      Result Value Ref Range   Glucose-Capillary 130 (*) 70 - 99 mg/dL  GLUCOSE, CAPILLARY     Status: Abnormal   Collection Time    10/02/13  8:05 AM      Result Value Ref Range   Glucose-Capillary 107 (*) 70 - 99 mg/dL  GLUCOSE, CAPILLARY     Status: Abnormal   Collection Time    10/02/13 11:35 AM      Result Value Ref Range   Glucose-Capillary 104 (*) 70 - 99 mg/dL  GLUCOSE, CAPILLARY     Status: None   Collection Time    10/02/13  4:37 PM      Result Value Ref Range   Glucose-Capillary 94  70 - 99 mg/dL   Labs are reviewed.  Current Facility-Administered Medications  Medication Dose Route Frequency Provider Last Rate Last Dose  . 0.9 %  sodium chloride infusion   Intravenous Continuous Rise Patience, MD 75 mL/hr at 10/02/13 0017    . acetaminophen (TYLENOL) tablet 650 mg  650 mg Oral Q6H PRN Rise Patience, MD       Or  . acetaminophen (TYLENOL) suppository 650 mg  650 mg Rectal Q6H PRN Rise Patience, MD      .  HYDROmorphone (DILAUDID) injection 1 mg  1 mg Intravenous Q3H PRN Rise Patience, MD   1 mg at 10/02/13 1631  . ondansetron (ZOFRAN) tablet 4 mg  4 mg Oral Q6H PRN Rise Patience, MD       Or  . ondansetron [facility]) injection 4 mg  4 mg Intravenous Q6H PRN Rise Patience, MD      . Derrill Memo ON 10/03/2013] pneumococcal 23 valent vaccine (PNU-IMMUNE) injection 0.5 mL  0.5 mL Intramuscular Tomorrow-1000 Rise Patience, MD      . promethazine (PHENERGAN) injection 12.5 mg  12.5 mg Intravenous Q6H PRN Dianne Dun, NP   12.5 mg at 10/02/13 1747  . Tdap (BOOSTRIX) injection 0.5 mL  0.5 mL Intramuscular Once [employer], DO      . thiamine (B-1) injection 100 mg  100 mg Intravenous Daily Rise Patience, MD   100 mg at 10/02/13 0345    Psychiatric Specialty Exam:     Blood pressure 127/90, pulse 58, temperature 97.7 F (36.5 C), temperature source Oral, resp. rate 20, height 6' (1.829 m), weight 160 lb (72.576 kg), SpO2 97.00%.Body mass index is 21.7 kg/(m^2).  General Appearance: Casual  Eye Contact::  Fair  Speech:  Slow  Volume:  Normal  Mood:  Anxious  Affect:  Congruent  Thought Process:  Logical  Orientation:  Full (Time, Place, and Person)  Thought Content:  Rumination  Suicidal Thoughts:  No  Homicidal Thoughts:  No  Memory:  Immediate;   Good Recent;   Good Remote;   Fair  Judgement:  Intact  Insight:  Lacking  Psychomotor Activity:  Decreased  Concentration:  Fair  Recall:  [place]  Language: Fair  Akathisia:  No  Handed:  Right  AIMS (if indicated):     Assets:  Communication  Skills Desire for Improvement Housing  Sleep:      Musculoskeletal: Strength & Muscle Tone: within normal limits Gait & Station: normal Patient leans: N/A  Treatment Plan Summary: Medication management, patient is willing to try a mood stabilizer to help his mood swings anger and insomnia.  Given the fact that mother has bipolar  disorder, patient has history of anger, alcohol and not responding to antidepressant, we will try Depakote 250 mg at bedtime to help for mood lability if not medically contraindicated.  This can be further increased to 500 mg if patient can tolerate very well.  Patient does not need inpatient psychiatric services at this time.  Patient can be of followup outpatient upon discharge.  Please call [phone] if you have any further questions.  Arlyce Harman  10/02/2013 6:11 PM

## 2013-10-02 NOTE — Consult Note (Signed)
Unassigned patient Reason for Consult: Acute pancreatitis. Referring Physician: THP.  [name] is an 33 y.o. male.  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 He claims he has been sober since 09/12/13. He has had several "dry spells" but admits to relapsing again and again. He has had severe nausea without vomiting and rates his pain at 8-9/10 in intensity at this time. He was followed by a gastroenterologist at [facility] till he lost his insurance. He has been on pancreatic supplements Zen-pep till he could not afford to buy them over the last 6 months as he did not have any insurance. He claims his abdominal pain and his GI symptoms are well controlled when he is on the Zen-pep. He was recently admitted to [place] last month for depression and suicidal ideation. He tells me that he will be moving in with his parents in [place], [place] and will have insurance starting 10/13/13.        0 Past Medical History  Diagnosis Date  . Pancreatitis   . Cystic fibrosis   . Alcohol abuse    Past Surgical History  Procedure Laterality Date  . Cholecystectomy    . Nasal sinus surgery     Family History  Problem Relation Age of Onset  . Diabetes Mellitus II Other   . Colon cancer Other    Social History:  reports that he has never smoked. He does not have any smokeless tobacco history on file. He reports that he does not drink alcohol or use illicit drugs. He used to work for [employer] but does not have a job at the present time. As per the chart, he is homeless as well.      Allergies: No Known Allergies  Medications: I have reviewed the patient's current medications.  Results for orders placed during the hospital encounter of 10/01/13 (from the past 48  hour(s))  CBC WITH DIFFERENTIAL     Status: Abnormal   Collection Time    10/01/13  5:03 PM      Result Value Ref Range   WBC 11.6 (*) 4.0 - 10.5 K/uL   RBC 4.66  4.22 - 5.81 MIL/uL   Hemoglobin 14.8  13.0 - 17.0 g/dL   HCT 41.3  39.0 - 52.0 %   MCV 88.6  78.0 - 100.0 fL   MCH 31.8  26.0 - 34.0 pg   MCHC 35.8  30.0 - 36.0 g/dL   RDW 12.4  11.5 - 15.5 %   Platelets 222  150 - 400 K/uL   Neutrophils Relative % 82 (*) 43 - 77 %   Neutro Abs 9.6 (*) 1.7 - 7.7 K/uL   Lymphocytes Relative 14  12 - 46 %   Lymphs Abs 1.6  0.7 - 4.0 K/uL   Monocytes Relative 4  3 - 12 %   Monocytes Absolute 0.5  0.1 - 1.0 K/uL   Eosinophils Relative 0  0 - 5 %   Eosinophils Absolute 0.1  0.0 - 0.7 K/uL   Basophils Relative 0  0 - 1 %   Basophils Absolute 0.0  0.0 - 0.1 K/uL  COMPREHENSIVE METABOLIC PANEL     Status: Abnormal   Collection Time    10/01/13  5:03 PM  Result Value Ref Range   Sodium 137  137 - 147 mEq/L   Potassium 4.0  3.7 - 5.3 mEq/L   Chloride 100  96 - 112 mEq/L   CO2 24  19 - 32 mEq/L   Glucose, Bld 206 (*) 70 - 99 mg/dL   BUN 7  6 - 23 mg/dL   Creatinine, Ser 0.71  0.50 - 1.35 mg/dL   Calcium 9.4  8.4 - 10.5 mg/dL   Total Protein 7.4  6.0 - 8.3 g/dL   Albumin 4.3  3.5 - 5.2 g/dL   AST 22  0 - 37 U/L   ALT 25  0 - 53 U/L   Alkaline Phosphatase 133 (*) 39 - 117 U/L   Total Bilirubin 0.7  0.3 - 1.2 mg/dL   GFR calc non Af Amer >90  >90 mL/min   GFR calc Af Amer >90  >90 mL/min   Comment: (NOTE)     The eGFR has been calculated using the CKD EPI equation.     This calculation has not been validated in all clinical situations.     eGFR's persistently <90 mL/min signify possible Chronic Kidney     Disease.  LIPASE, BLOOD     Status: Abnormal   Collection Time    10/01/13  5:03 PM      Result Value Ref Range   Lipase 7 (*) 11 - 59 U/L  URINALYSIS, ROUTINE W REFLEX MICROSCOPIC     Status: Abnormal   Collection Time    10/01/13  9:52 PM      Result Value Ref Range    Color, Urine AMBER (*) YELLOW   Comment: BIOCHEMICALS MAY BE AFFECTED BY COLOR   APPearance CLEAR  CLEAR   Specific Gravity, Urine 1.026  1.005 - 1.030   pH 6.5  5.0 - 8.0   Glucose, UA 500 (*) NEGATIVE mg/dL   Hgb urine dipstick NEGATIVE  NEGATIVE   Bilirubin Urine NEGATIVE  NEGATIVE   Ketones, ur NEGATIVE  NEGATIVE mg/dL   Protein, ur NEGATIVE  NEGATIVE mg/dL   Urobilinogen, UA 1.0  0.0 - 1.0 mg/dL   Nitrite NEGATIVE  NEGATIVE   Leukocytes, UA TRACE (*) NEGATIVE  URINE MICROSCOPIC-ADD ON     Status: None   Collection Time    10/01/13  9:52 PM      Result Value Ref Range   Squamous Epithelial / LPF RARE  RARE   WBC, UA 0-2  <3 WBC/hpf   RBC / HPF 0-2  <3 RBC/hpf  URINE RAPID DRUG SCREEN (HOSP PERFORMED)     Status: None   Collection Time    10/01/13  9:52 PM      Result Value Ref Range   Opiates NONE DETECTED  NONE DETECTED   Cocaine NONE DETECTED  NONE DETECTED   Benzodiazepines NONE DETECTED  NONE DETECTED   Amphetamines NONE DETECTED  NONE DETECTED   Tetrahydrocannabinol NONE DETECTED  NONE DETECTED   Barbiturates NONE DETECTED  NONE DETECTED   Comment:            DRUG SCREEN FOR MEDICAL PURPOSES     ONLY.  IF CONFIRMATION IS NEEDED     FOR ANY PURPOSE, NOTIFY LAB     WITHIN 5 DAYS.                LOWEST DETECTABLE LIMITS     FOR URINE DRUG SCREEN     Drug Class       Cutoff (ng/mL)  Amphetamine      1000     Barbiturate      200     Benzodiazepine   665     Tricyclics       993     Opiates          300     Cocaine          300     THC              50  COMPREHENSIVE METABOLIC PANEL     Status: Abnormal   Collection Time    10/02/13  4:03 AM      Result Value Ref Range   Sodium 142  137 - 147 mEq/L   Potassium 3.7  3.7 - 5.3 mEq/L   Chloride 106  96 - 112 mEq/L   CO2 24  19 - 32 mEq/L   Glucose, Bld 137 (*) 70 - 99 mg/dL   BUN 6  6 - 23 mg/dL   Creatinine, Ser 0.73  0.50 - 1.35 mg/dL   Calcium 8.2 (*) 8.4 - 10.5 mg/dL   Total Protein 5.9 (*) 6.0 - 8.3  g/dL   Albumin 3.3 (*) 3.5 - 5.2 g/dL   AST 15  0 - 37 U/L   ALT 18  0 - 53 U/L   Alkaline Phosphatase 114  39 - 117 U/L   Total Bilirubin 0.5  0.3 - 1.2 mg/dL   GFR calc non Af Amer >90  >90 mL/min   GFR calc Af Amer >90  >90 mL/min   Comment: (NOTE)     The eGFR has been calculated using the CKD EPI equation.     This calculation has not been validated in all clinical situations.     eGFR's persistently <90 mL/min signify possible Chronic Kidney     Disease.  CBC WITH DIFFERENTIAL     Status: Abnormal   Collection Time    10/02/13  4:03 AM      Result Value Ref Range   WBC 5.8  4.0 - 10.5 K/uL   RBC 3.93 (*) 4.22 - 5.81 MIL/uL   Hemoglobin 12.6 (*) 13.0 - 17.0 g/dL   HCT 34.9 (*) 39.0 - 52.0 %   MCV 88.8  78.0 - 100.0 fL   MCH 32.1  26.0 - 34.0 pg   MCHC 36.1 (*) 30.0 - 36.0 g/dL   RDW 12.5  11.5 - 15.5 %   Platelets 148 (*) 150 - 400 K/uL   Comment: REPEATED TO VERIFY     SPECIMEN CHECKED FOR CLOTS   Neutrophils Relative % 56  43 - 77 %   Neutro Abs 3.3  1.7 - 7.7 K/uL   Lymphocytes Relative 35  12 - 46 %   Lymphs Abs 2.0  0.7 - 4.0 K/uL   Monocytes Relative 6  3 - 12 %   Monocytes Absolute 0.4  0.1 - 1.0 K/uL   Eosinophils Relative 2  0 - 5 %   Eosinophils Absolute 0.1  0.0 - 0.7 K/uL   Basophils Relative 0  0 - 1 %   Basophils Absolute 0.0  0.0 - 0.1 K/uL  LIPASE, BLOOD     Status: Abnormal   Collection Time    10/02/13  4:03 AM      Result Value Ref Range   Lipase 6 (*) 11 - 59 U/L  HEMOGLOBIN A1C     Status: Abnormal   Collection Time    10/02/13  4:05 AM      Result Value Ref Range   Hemoglobin A1C 6.4 (*) <5.7 %   Comment: (NOTE)                                                                               According to the ADA Clinical Practice Recommendations for 2011, when     HbA1c is used as a screening test:      >=6.5%   Diagnostic of Diabetes Mellitus               (if abnormal result is confirmed)     5.7-6.4%   Increased risk of developing  Diabetes Mellitus     References:Diagnosis and Classification of Diabetes Mellitus,Diabetes     WVPX,1062,69(SWNIO 1):S62-S69 and Standards of Medical Care in             Diabetes - 2011,Diabetes Care,2011,34 (Suppl 1):S11-S61.   Mean Plasma Glucose 137 (*) <117 mg/dL   Comment: Performed at [place], [facility]     Status: Abnormal   Collection Time    10/02/13  4:07 AM      Result Value Ref Range   Glucose-Capillary 130 (*) 70 - 99 mg/dL  GLUCOSE, CAPILLARY     Status: Abnormal   Collection Time    10/02/13  8:05 AM      Result Value Ref Range   Glucose-Capillary 107 (*) 70 - 99 mg/dL  GLUCOSE, CAPILLARY     Status: Abnormal   Collection Time    10/02/13 11:35 AM      Result Value Ref Range   Glucose-Capillary 104 (*) 70 - 99 mg/dL  GLUCOSE, CAPILLARY     Status: None   Collection Time    10/02/13  4:37 PM      Result Value Ref Range   Glucose-Capillary 94  70 - 99 mg/dL   Dg Chest 2 View  10/01/2013   CLINICAL DATA:  Cystic fibrosis  EXAM: CHEST - 2 VIEW  COMPARISON:  None.  FINDINGS: Lungs are hyper aerated. Linear opacities are seen throughout both central lung zones. Patchy densities are seen in the upper lung zones and central lower lung zones. An inflammatory process is not excluded. Normal heart size. No pneumothorax.  IMPRESSION: Hyperaeration and linear opacities consistent with cystic fibrosis.  Patchy bilateral opacities are noted and an inflammatory process cannot be excluded without a comparison with prior studies.   Electronically Signed   By: Maryclare Bean M.D.   On: 10/01/2013 23:20   Dg Abd 1 View  10/01/2013   CLINICAL DATA:  Abdominal pain  EXAM: ABDOMEN - 1 VIEW  COMPARISON:  None.  FINDINGS: No disproportionate dilatation of bowel. No obvious free intraperitoneal gas. Moderate stool burden in the colon.  IMPRESSION: Nonobstructive bowel gas pattern.   Electronically Signed   By: Maryclare Bean M.D.   On: 10/01/2013 23:18   Review of Systems   Constitutional: Negative.   HENT: Negative.   Eyes: Negative.   Respiratory: Negative.   Cardiovascular: Negative.   Gastrointestinal: Positive for nausea and abdominal pain. Negative for heartburn, vomiting, diarrhea and constipation.  Genitourinary: Negative.   Musculoskeletal: Negative.  Skin: Negative.   Neurological: Negative.   Endo/Heme/Allergies: Negative.   Psychiatric/Behavioral: Positive for depression and substance abuse. Negative for suicidal ideas, hallucinations and memory loss. The patient is nervous/anxious. The patient does not have insomnia.    Blood pressure 127/90, pulse 58, temperature 97.7 F (36.5 C), temperature source Oral, resp. rate 20, height 6' (1.829 m), weight 72.576 kg (160 lb), SpO2 97.00%. Physical Exam  Constitutional: He is oriented to person, place, and time. He appears well-developed and well-nourished.  HENT:  Head: Normocephalic and atraumatic.  Eyes: Conjunctivae and EOM are normal. Pupils are equal, round, and reactive to light.  Neck: Normal range of motion. Neck supple.  Cardiovascular: Normal rate and regular rhythm.   Respiratory: Effort normal and breath sounds normal.  GI: Soft. He exhibits no distension and no mass. There is tenderness. There is guarding. There is no rebound.  Musculoskeletal: Normal range of motion.  Neurological: He is alert and oriented to person, place, and time.  Skin: Skin is warm and dry.  Psychiatric: He has a normal mood and affect. His behavior is normal. Judgment and thought content normal.   Assessment/Plan: 1) ?Acute pancreatitis ?CF with ETOH abuse: we need to get his records from [facility] and make sure there is no secondary gain in his case as his labs are essentially normal. I suspect he may have drug seeking behavior as well. His Lipase levels have been normal and therefore if his need for narcotics persists, a CT scan of the abdomen and pelvis will be helpful. Once he is able to tolerate PO's,  pancreatic enzyme supplements should be started to help with his symptoms.  2) Depression/Bipolar disorder.    Juanita Craver 10/02/2013, 6:43 PM

## 2013-10-03 ENCOUNTER — Encounter (HOSPITAL_COMMUNITY): Payer: Self-pay | Admitting: Radiology

## 2013-10-03 ENCOUNTER — Inpatient Hospital Stay (HOSPITAL_COMMUNITY): Payer: BC Managed Care – PPO

## 2013-10-03 LAB — GLUCOSE, CAPILLARY
GLUCOSE-CAPILLARY: 58 mg/dL — AB (ref 70–99)
GLUCOSE-CAPILLARY: 83 mg/dL (ref 70–99)
GLUCOSE-CAPILLARY: 71 mg/dL (ref 70–99)
GLUCOSE-CAPILLARY: 101 mg/dL — AB (ref 70–99)
Glucose-Capillary: 104 mg/dL — ABNORMAL HIGH (ref 70–99)
Glucose-Capillary: 122 mg/dL — ABNORMAL HIGH (ref 70–99)
Glucose-Capillary: 88 mg/dL (ref 70–99)

## 2013-10-03 LAB — BASIC METABOLIC PANEL
BUN: 5 mg/dL — AB (ref 6–23)
CALCIUM: 8.5 mg/dL (ref 8.4–10.5)
CO2: 24 meq/L (ref 19–32)
Chloride: 103 mEq/L (ref 96–112)
Creatinine, Ser: 0.77 mg/dL (ref 0.50–1.35)
GFR calc Af Amer: >90 mL/min (ref >90)
GLUCOSE: 95 mg/dL (ref 70–99)
Potassium: 3.8 mEq/L (ref 3.7–5.3)
Sodium: 140 mEq/L (ref 137–147)

## 2013-10-03 LAB — CBC
HCT: 39.1 % (ref 39.0–52.0)
HEMOGLOBIN: 13.7 g/dL (ref 13.0–17.0)
MCH: 31.6 pg (ref 26.0–34.0)
MCHC: 35 g/dL (ref 30.0–36.0)
MCV: 90.3 fL (ref 78.0–100.0)
PLATELETS: 155 10*3/uL (ref 150–400)
RBC: 4.33 MIL/uL (ref 4.22–5.81)
RDW: 12.5 % (ref 11.5–15.5)
WBC: 6.1 10*3/uL (ref 4.0–10.5)

## 2013-10-03 MED ORDER — DEXTROSE 50 % IV SOLN
25.0000 mL | Freq: Once | INTRAVENOUS | Status: AC | PRN
  Administered 2013-10-03: 25 mL via INTRAVENOUS

## 2013-10-03 MED ORDER — DEXTROSE 50 % IV SOLN
INTRAVENOUS | Status: AC
  Filled 2013-10-03: qty 50

## 2013-10-03 MED ORDER — DIVALPROEX SODIUM ER 250 MG PO TB24
250.0000 mg | ORAL_TABLET | Freq: Every day | ORAL | Status: DC
  Administered 2013-10-03 – 2013-10-04 (×2): 250 mg via ORAL
  Filled 2013-10-03 (×3): qty 1

## 2013-10-03 MED ORDER — KCL IN DEXTROSE-NACL 20-5-0.45 MEQ/L-%-% IV SOLN
INTRAVENOUS | Status: DC
  Administered 2013-10-03: 125 mL/h via INTRAVENOUS
  Administered 2013-10-03 – 2013-10-04 (×3): via INTRAVENOUS
  Filled 2013-10-03 (×10): qty 1000

## 2013-10-03 MED ORDER — IOHEXOL 300 MG/ML  SOLN
100.0000 mL | Freq: Once | INTRAMUSCULAR | Status: AC | PRN
  Administered 2013-10-03: 100 mL via INTRAVENOUS

## 2013-10-03 NOTE — Progress Notes (Signed)
Hypoglycemic Event  CBG: 58  Treatment: D50 IV 25 mL  Symptoms: Sweaty  Follow-up CBG: Time 0450 CBG Result:122  Possible Reasons for Event: Inadequate meal intake  Comments/MD notified:no    Jaiveon Suppes Essie HartPing Peng  Remember to initiate Hypoglycemia Order Set & complete

## 2013-10-03 NOTE — Progress Notes (Signed)
[facility] PROGRESS NOTE  [name] MVH:[id number] DOB: [birth year] DOA: 10/01/2013 PCP: PROVIDER NOT IN SYSTEM Brief Narrative: Jeffrey LickSpencer Santaella is a 33 y.o. male history of cystic fibrosis and chronic pancreatitis presented to the ER because of abdominal pain. Patient was admitted last week at [facility] with complaints of abdominal pain. 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 Patient was discharged. Patient is presently homeless. Since patient was unable to keep anything due to the recurrence of pain patient [facility]. In the ER labs show elevated blood sugar and alkaline phosphatase. Patient states that his pain has worsened from yesterday. Has had nausea vomiting and has chronic diarrhea from pancreatitis. Pain is mostly located in the epigastric area constant. Denies any chest pain or shortness of breath. [facility]. Patient has been admitted last month at [facility] for depression and suicidal ideation. Presently denies any suicidal ideation or homicidal ideations. Patient has had previous history of drug abuse.   Assessment/Plan: Active Problems:   Pancreatitis/Abdominal pain - Continue with bowel rest. Place on MIVF's - Pain control - antiemetics - advance diet with improvement in condition. - As per GI recommendations will obtain CT scan of abdomen and pelvis - May be related to 2ary gain given his history and nearly normal lipase levels    Cystic fibrosis - stable  Code Status: full  Family Communication: none at bedside.  Disposition Plan: Pending resolution or improvement in abdominal discomfort   Consultants:  None  Procedures:  none  Antibiotics:  none  HPI/Subjective: No new complaints. Still complaining of abdominal discomfort  Objective: Filed Vitals:   10/03/13 1300  BP: 127/80  Pulse: 67  Temp: 98 F (36.7 C)  Resp: 20    Intake/Output Summary (Last 24 hours) at 10/03/13 1752 Last data filed at 10/02/13 2300  Gross per 24 hour  Intake 1703.75 ml  Output      0 ml  Net 1703.75 ml   Filed Weights   10/01/13 1654  Weight: 72.576 kg (160 lb)    Exam:   General:  Pt in NAD, alert and awake  Cardiovascular: RRR, no MRG  Respiratory: CTA BL, no wheezes  Abdomen: soft, + epigastric discomfort with palpation, ND  Musculoskeletal: no cyanosis or clubbing   Data Reviewed: Basic Metabolic Panel:  Recent Labs Lab 10/01/13 1703 10/02/13 0403 10/03/13 0547  NA 137 142 140  K 4.0 3.7 3.8  CL 100 106 103  CO2 24 24 24   GLUCOSE 206* 137* 95  BUN 7 6 5*  CREATININE 0.71 0.73 0.77  CALCIUM 9.4 8.2* 8.5   Liver Function Tests:  Recent Labs Lab 10/01/13 1703 10/02/13 0403  AST 22 15  ALT 25 18  ALKPHOS 133* 114  BILITOT 0.7 0.5  PROT 7.4 5.9*  ALBUMIN 4.3 3.3*    Recent Labs Lab 10/01/13 1703 10/02/13 0403  LIPASE 7* 6*   No results found for this basename: AMMONIA,  in the last 168 hours CBC:  Recent Labs Lab 10/01/13 1703 10/02/13 0403 10/03/13 0547  WBC 11.6* 5.8 6.1  NEUTROABS 9.6* 3.3  --   HGB 14.8 12.6* 13.7  HCT 41.3 34.9* 39.1  MCV 88.6 88.8 90.3  PLT 222 148* 155   Cardiac Enzymes: No results found for this basename: CKTOTAL, CKMB,  CKMBINDEX, TROPONINI,  in the last 168 hours BNP (last 3 results) No results found for this basename: PROBNP,  in the last 8760 hours CBG:  Recent Labs Lab 10/03/13 0408 10/03/13 0450 10/03/13 0815 10/03/13 1118 10/03/13 1535  GLUCAP 58* 122* 83 71 104*    No results found for this or any previous visit (from the past 240 hour(s)).   Studies: Dg Chest 2 View  10/01/2013   CLINICAL DATA:  Cystic fibrosis  EXAM: CHEST - 2 VIEW  COMPARISON:  None.  FINDINGS: Lungs are hyper aerated. Linear opacities are seen throughout both central lung zones.  Patchy densities are seen in the upper lung zones and central lower lung zones. An inflammatory process is not excluded. Normal heart size. No pneumothorax.  IMPRESSION: Hyperaeration and linear opacities consistent with cystic fibrosis.  Patchy bilateral opacities are noted and an inflammatory process cannot be excluded without a comparison with prior studies.   Electronically Signed   By: Maryclare BeanArt  Hoss M.D.   On: 10/01/2013 23:20   Dg Abd 1 View  10/01/2013   CLINICAL DATA:  Abdominal pain  EXAM: ABDOMEN - 1 VIEW  COMPARISON:  None.  FINDINGS: No disproportionate dilatation of bowel. No obvious free intraperitoneal gas. Moderate stool burden in the colon.  IMPRESSION: Nonobstructive bowel gas pattern.   Electronically Signed   By: Maryclare BeanArt  Hoss M.D.   On: 10/01/2013 23:18    Scheduled Meds: . thiamine IV  100 mg Intravenous Daily   Continuous Infusions: . dextrose 5 % and 0.45 % NaCl with KCl 20 mEq/L 125 mL/hr (10/03/13 1152)     Time spent: > 35 minutes    [name]  [facility] Pager [phone]. If 7PM-7AM, please contact night-coverage at [email or web address], password [email or web address] 10/03/2013, 5:52 PM  LOS: 2 days

## 2013-10-04 LAB — GLUCOSE, CAPILLARY
Glucose-Capillary: 116 mg/dL — ABNORMAL HIGH (ref 70–99)
Glucose-Capillary: 122 mg/dL — ABNORMAL HIGH (ref 70–99)
Glucose-Capillary: 128 mg/dL — ABNORMAL HIGH (ref 70–99)
Glucose-Capillary: 135 mg/dL — ABNORMAL HIGH (ref 70–99)
Glucose-Capillary: 246 mg/dL — ABNORMAL HIGH (ref 70–99)

## 2013-10-04 MED ORDER — CIPROFLOXACIN IN D5W 400 MG/200ML IV SOLN
400.0000 mg | Freq: Two times a day (BID) | INTRAVENOUS | Status: DC
  Administered 2013-10-05 (×2): 400 mg via INTRAVENOUS
  Filled 2013-10-04 (×3): qty 200

## 2013-10-04 MED ORDER — METRONIDAZOLE IN NACL 5-0.79 MG/ML-% IV SOLN
500.0000 mg | Freq: Three times a day (TID) | INTRAVENOUS | Status: DC
  Administered 2013-10-04 – 2013-10-05 (×3): 500 mg via INTRAVENOUS
  Filled 2013-10-04 (×6): qty 100

## 2013-10-04 MED ORDER — CIPROFLOXACIN IN D5W 400 MG/200ML IV SOLN
400.0000 mg | INTRAVENOUS | Status: AC
  Administered 2013-10-04: 400 mg via INTRAVENOUS
  Filled 2013-10-04: qty 200

## 2013-10-04 MED ORDER — PANCRELIPASE (LIP-PROT-AMYL) 12000-38000 UNITS PO CPEP
1.0000 | ORAL_CAPSULE | Freq: Three times a day (TID) | ORAL | Status: DC
  Administered 2013-10-04 – 2013-10-05 (×2): 1 via ORAL
  Filled 2013-10-04 (×5): qty 1

## 2013-10-04 NOTE — Progress Notes (Signed)
ANTIBIOTIC CONSULT NOTE - INITIAL  Pharmacy Consult for Ciprofloxacin Indication: intra-abd infection  No Known Allergies  Patient Measurements: Height: 6' (182.9 cm) Weight: 160 lb (72.576 kg) IBW/kg (Calculated) : 77.6  Vital Signs: Temp: 97.6 F (36.4 C) (04/22 1329) Temp src: Oral (04/22 1329) BP: 130/83 mmHg (04/22 1329) Pulse Rate: 61 (04/22 1329) Intake/Output from previous day: 04/21 0701 - 04/22 0700 In: 1141.7 [I.V.:1141.7] Out: -  Intake/Output from this shift:    Labs:  Recent Labs  10/01/13 1703 10/02/13 0403 10/03/13 0547  WBC 11.6* 5.8 6.1  HGB 14.8 12.6* 13.7  PLT 222 148* 155  CREATININE 0.71 0.73 0.77   Estimated Creatinine Clearance: 138.6 ml/min (by C-G formula based on Cr of 0.77). No results found for this basename: VANCOTROUGH, VANCOPEAK, VANCORANDOM, GENTTROUGH, GENTPEAK, GENTRANDOM, TOBRATROUGH, TOBRAPEAK, TOBRARND, AMIKACINPEAK, AMIKACINTROU, AMIKACIN,  in the last 72 hours   Microbiology: No results found for this or any previous visit (from the past 720 hour(s)).  Medical History: Past Medical History  Diagnosis Date  . Pancreatitis   . Cystic fibrosis   . Alcohol abuse     Medications:  No prescriptions prior to admission   Assessment: 33 y.o. male presents with abd pain. Noted pt with h/o cystic fibrosis and chronic pancreatitis. Plan for cipro and flagyl empiric for colitis.  Goal of Therapy:  Resolution of infection  Plan:  1. Cipro 400mg  IV now then 400mg  IV q12h 2. Will f/u micro data, clinical condition, renal function  Christoper Fabianaron Christabel Camire, PharmD, BCPS Clinical pharmacist, pager [phone] 10/04/2013,2:55 PM

## 2013-10-04 NOTE — Progress Notes (Signed)
[facility] PROGRESS NOTE  Jeffrey LickSpencer Thoman ZOX:[id number] DOB: [birth year] DOA: 10/01/2013 PCP: PROVIDER NOT IN SYSTEM Brief Narrative: [name] is a 33 y.o. male history of cystic fibrosis and chronic pancreatitis presented to the ER because of abdominal pain. Patient was admitted last week at [facility] with complaints of abdominal pain. 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 Patient was discharged. Patient is presently homeless. Since patient was unable to keep anything due to the recurrence of pain patient [facility]. In the ER labs show elevated blood sugar and alkaline phosphatase. Patient states that his pain has worsened from yesterday. Has had nausea vomiting and has chronic diarrhea from pancreatitis. Pain is mostly located in the epigastric area constant. Denies any chest pain or shortness of breath. Patient is scheduled to [facility]. Patient has been admitted last month at [facility] for depression and suicidal ideation. Presently denies any suicidal ideation or homicidal ideations. Patient has had previous history of drug abuse.   Assessment/Plan:    Pancreatitis/Abdominal pain - Lipase is normal, CT abdomen/pelvis does not show pancreatic inflammation - Pain control - antiemetics - advance diet with improvement in condition. - Will start pancreatic enzymes with meals - May be related to 2ary gain given his history and nearly normal lipase levels  ? Colitis - CT abdomen shows thickening of the ascending colon,  - Will start Cipro and Flagyl    Cystic fibrosis - stable  Code Status: full  Family Communication: none at bedside.  Disposition Plan: Pending resolution or improvement in abdominal  discomfort   Consultants:  None  Procedures:  none  Antibiotics:  none  HPI/Subjective: Patient seen and examined,  Still complaining of abdominal discomfort  Objective: Filed Vitals:   10/04/13 1329  BP: 130/83  Pulse: 61  Temp: 97.6 F (36.4 C)  Resp: 18    Intake/Output Summary (Last 24 hours) at 10/04/13 1437 Last data filed at 10/04/13 0901  Gross per 24 hour  Intake 1141.67 ml  Output      0 ml  Net 1141.67 ml   Filed Weights   10/01/13 1654  Weight: 72.576 kg (160 lb)    Exam:  Physical Exam: Head: Normocephalic, atraumatic.  Eyes: No signs of jaundice, EOMI Nose: Mucous membranes dry.  Throat: Oropharynx nonerythematous, no exudate appreciated.  Neck: supple,No deformities, masses, or tenderness noted. Lungs: Normal respiratory effort. B/L Clear to auscultation, no crackles or wheezes.  Heart: Regular RR. S1 and S2 normal  Abdomen: BS normoactive. Soft, mild epigastric tenderness Extremities: No pretibial edema, no erythema   Data Reviewed: Basic Metabolic Panel:  Recent Labs Lab 10/01/13 1703 10/02/13 0403 10/03/13 0547  NA 137 142 140  K 4.0 3.7 3.8  CL 100 106 103  CO2 24 24 24   GLUCOSE 206* 137* 95  BUN 7 6 5*  CREATININE 0.71 0.73 0.77  CALCIUM 9.4 8.2* 8.5   Liver Function Tests:  Recent Labs Lab 10/01/13 1703 10/02/13 0403  AST 22 15  ALT 25 18  ALKPHOS 133* 114  BILITOT 0.7 0.5  PROT 7.4 5.9*  ALBUMIN 4.3 3.3*    Recent Labs Lab 10/01/13 1703 10/02/13 0403  LIPASE 7* 6*   No results found for this basename: AMMONIA,  in the last 168 hours CBC:  Recent Labs Lab 10/01/13 1703  10/02/13 0403 10/03/13 0547  WBC 11.6* 5.8 6.1  NEUTROABS 9.6* 3.3  --   HGB 14.8 12.6* 13.7  HCT 41.3 34.9* 39.1  MCV 88.6 88.8 90.3  PLT 222 148* 155   Cardiac Enzymes: No results found for this basename: CKTOTAL, CKMB, CKMBINDEX, TROPONINI,  in the last 168 hours BNP (last 3 results) No results found for this basename:  PROBNP,  in the last 8760 hours CBG:  Recent Labs Lab 10/03/13 2053 10/04/13 0009 10/04/13 0405 10/04/13 0916 10/04/13 1423  GLUCAP 101* 116* 128* 135* 122*    No results found for this or any previous visit (from the past 240 hour(s)).   Studies: Ct Abdomen Pelvis W Contrast  10/03/2013   CLINICAL DATA:  Re-evaluation of the pancreas. Extreme upper abdominal pain. History of pancreatitis  EXAM: CT ABDOMEN AND PELVIS WITH CONTRAST  TECHNIQUE: Multidetector CT imaging of the abdomen and pelvis was performed using the standard protocol following bolus administration of intravenous contrast.  CONTRAST:  100mL OMNIPAQUE IOHEXOL 300 MG/ML  SOLN  COMPARISON:  DG ABD 1 VIEW dated 10/01/2013; CT ABD-PELV W/ CM dated 09/25/2013  FINDINGS: Vague nodular infiltration in the lung bases is similar to previous study. Surgical absence of the gallbladder. Fatty atrophy of the pancreas. No developing pancreatic ductal dilatation, bile duct dilatation, peripancreatic infiltration, or peripancreatic fluid collection. Mild fatty infiltration of the liver. Prominent spleen size.  The adrenal glands, kidneys, abdominal aorta, inferior vena cava, and retroperitoneal lymph nodes are unremarkable. The stomach, small bowel, and colon are not abnormally distended. There is suggestion of wall thickening in the ascending colon. This could be due to 100 distention but focal colitis is not excluded. No free air or free fluid in the abdomen.  Pelvis: Appendix is normal. No diverticulitis. Bladder wall is not thickened. No mass or lymphadenopathy in the pelvis. No destructive bone lesions.  IMPRESSION: No developing inflammatory changes around the pancreas. Diffuse fatty replacement of the pancreas. Possible wall thickening in the ascending colon may be due to under distention but focal colitis is not excluded. Examination is otherwise unchanged since previous study.   Electronically Signed   By: Burman NievesWilliam  Stevens M.D.   On:  10/03/2013 23:46    Scheduled Meds: . divalproex  250 mg Oral Daily  . thiamine IV  100 mg Intravenous Daily   Continuous Infusions: . dextrose 5 % and 0.45 % NaCl with KCl 20 mEq/L 125 mL/hr at 10/04/13 0556     Time spent: > 35 minutes    Meredeth IdeGagan S Phineas Mcenroe  [facility] Pager [phone]. If 7PM-7AM, please contact night-coverage at [email or web address], password [email or web address] 10/04/2013, 2:37 PM  LOS: 3 days

## 2013-10-05 DIAGNOSIS — F3130 Bipolar disorder, current episode depressed, mild or moderate severity, unspecified: Secondary | ICD-10-CM

## 2013-10-05 LAB — GLUCOSE, CAPILLARY
GLUCOSE-CAPILLARY: 158 mg/dL — AB (ref 70–99)
Glucose-Capillary: 116 mg/dL — ABNORMAL HIGH (ref 70–99)
Glucose-Capillary: 137 mg/dL — ABNORMAL HIGH (ref 70–99)
Glucose-Capillary: 217 mg/dL — ABNORMAL HIGH (ref 70–99)

## 2013-10-05 MED ORDER — CIPROFLOXACIN HCL 500 MG PO TABS: 500.0000 mg | ORAL_TABLET | Freq: Two times a day (BID) | ORAL | Status: DC

## 2013-10-05 MED ORDER — METRONIDAZOLE 500 MG PO TABS: 500.0000 mg | ORAL_TABLET | Freq: Three times a day (TID) | ORAL | Status: DC

## 2013-10-05 MED ORDER — OXYCODONE HCL 5 MG PO TABS: 5.0000 mg | ORAL_TABLET | ORAL | Status: DC | PRN

## 2013-10-05 MED ORDER — DIVALPROEX SODIUM ER 250 MG PO TB24: 250.0000 mg | ORAL_TABLET | Freq: Every day | ORAL | Status: DC

## 2013-10-05 MED ORDER — PANCRELIPASE (LIP-PROT-AMYL) 12000-38000 UNITS PO CPEP: 1.0000 | ORAL_CAPSULE | Freq: Three times a day (TID) | ORAL | Status: DC

## 2013-10-05 NOTE — Clinical Social Work Psych Note (Signed)
Psych CSW was consulted for depression and hx dx of Bipolar.  Psych CSW evaluated pt and provided psychosocial education as well as resource navigation education re: resources.  Full assessment to follow.  Vickii PennaGina Kassem Kibbe, LCSWA [phone]  Clinical Social Work

## 2013-10-05 NOTE — Progress Notes (Addendum)
Discharge instructions, medications, community resources and upcoming appointments reviewed with Mr. [name].  Pt verbalizes understanding.  Discharged to home.  Dan MakerJames Maxamus Colao, RN, BSN, CCRN

## 2013-10-05 NOTE — Discharge Summary (Addendum)
Physician Discharge Summary  [name] ZOX:[id number] DOB: 15-Sep-1980 DOA: 10/01/2013  PCP: PROVIDER NOT IN SYSTEM  Admit date: 10/01/2013 Discharge date: 10/05/2013  Time spent: *50 minutes  Recommendations for Outpatient Follow-up:  1. Follow up PCP in 2 weeks   Discharge Diagnoses:  Active Problems:   Pancreatitis   Abdominal pain   Cystic fibrosis   Discharge Condition: Stable  Diet recommendation: Low salt diet  Filed Weights   10/01/13 1654 10/05/13 0500  Weight: 72.576 kg (160 lb) 73.8 kg (162 lb 11.2 oz)    History of present illness:  33 y.o. male history of cystic fibrosis and chronic pancreatitis presented to the ER because of abdominal pain. Patient was admitted [facility] at [facility] with complaints of abdominal pain. 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 Patient was discharged. Patient is presently homeless. Since patient was unable to keep anything due to the recurrence of pain patient [facility]. In the ER labs show elevated blood sugar and alkaline phosphatase. Patient states that his pain has worsened from yesterday. Has had nausea vomiting and has chronic diarrhea from pancreatitis. Pain is mostly located in the epigastric area constant. Denies any chest pain or shortness of breath. Patient is [facility] to [facility]. Patient has been [facility] last month at [facility] for depression and suicidal ideation. Presently denies any suicidal ideation or homicidal ideations. Patient has had previous history of drug abuse. Patient also has history of alcoholism and has not had any alcohol since March 31 3 weeks ago   Hospital Course:   Pancreatitis/Abdominal pain  Improved - Lipase is normal, CT abdomen/pelvis does not show pancreatic inflammation  - Pain control with opiates - advance diet with  improvement in condition.  - Will start pancreatic enzymes with meals  - May be related to 2ary gain given his history and nearly normal lipase levels   ? Colitis  - CT abdomen shows thickening of the ascending colon,  - Will start Cipro and Flagyl for seven days.  Cystic fibrosis  - stable  Bipolar disorder Patient was seen by Psych and started on Depakote 250 mg po daily  Procedures:  None  Consultations:  Gastroenterology  Discharge Exam: Filed Vitals:   10/04/13 2009  BP: 137/93  Pulse: 71  Temp: 97.9 F (36.6 C)  Resp: 18    General: Appear in no acute distress Cardiovascular: S1S2 RRR Respiratory: Clear bilaterally  Discharge Instructions You were cared for by a hospitalist during your hospital stay. If you have any questions about your discharge medications or the care you received while you were in the hospital after you are discharged, you can call the unit and asked to speak with the hospitalist on call if the hospitalist that took care of you is not available. Once you are discharged, your primary care physician will handle any further medical issues. Please note that NO REFILLS for any discharge medications will be authorized once you are discharged, as it is imperative that you return to your primary care physician (or establish a relationship with a primary care physician if you do not have one) for your aftercare needs so that they can reassess your need for medications and monitor your lab values.  Discharge Orders   Future Appointments Provider Department Dept Phone   10/18/2013 10:30 AM  [facility] Covering Provider [facility] [phone]   Future Orders Complete By Expires   Diet - low sodium heart healthy  As directed    Increase activity slowly  As directed        Medication List         ciprofloxacin 500 MG tablet  Commonly known as:  CIPRO  Take 1 tablet (500 mg total) by mouth 2 (two) times daily.     divalproex  250 MG 24 hr tablet  Commonly known as:  DEPAKOTE ER  Take 1 tablet (250 mg total) by mouth daily.     lipase/protease/amylase 09811 UNITS Cpep capsule  Commonly known as:  CREON-12/PANCREASE  Take 1 capsule by mouth 3 (three) times daily with meals.     metroNIDAZOLE 500 MG tablet  Commonly known as:  FLAGYL  Take 1 tablet (500 mg total) by mouth 3 (three) times daily.       No Known Allergies     Follow-up Information   Follow up with [facility]    . (Appointment scheduled for Wednesday May 6,2015 at 10:30am with Dr. Izola Price)    Contact information:   [address] [place] [address] [phone]      Follow up with Charna Elizabeth, MD In 2 days.   Specialty:  Gastroenterology   Contact information:   [address], [place] [address] [phone]        The results of significant diagnostics from this hospitalization (including imaging, microbiology, ancillary and laboratory) are listed below for reference.    Significant Diagnostic Studies: Dg Chest 2 View  10/01/2013   CLINICAL DATA:  Cystic fibrosis  EXAM: CHEST - 2 VIEW  COMPARISON:  None.  FINDINGS: Lungs are hyper aerated. Linear opacities are seen throughout both central lung zones. Patchy densities are seen in the upper lung zones and central lower lung zones. An inflammatory process is not excluded. Normal heart size. No pneumothorax.  IMPRESSION: Hyperaeration and linear opacities consistent with cystic fibrosis.  Patchy bilateral opacities are noted and an inflammatory process cannot be excluded without a comparison with prior studies.   Electronically Signed   By: Maryclare Bean M.D.   On: 10/01/2013 23:20   Dg Abd 1 View  10/01/2013   CLINICAL DATA:  Abdominal pain  EXAM: ABDOMEN - 1 VIEW  COMPARISON:  None.  FINDINGS: No disproportionate dilatation of bowel. No obvious free intraperitoneal gas. Moderate stool burden in the colon.  IMPRESSION: Nonobstructive  bowel gas pattern.   Electronically Signed   By: Maryclare Bean M.D.   On: 10/01/2013 23:18   Ct Abdomen Pelvis W Contrast  10/03/2013   CLINICAL DATA:  Re-evaluation of the pancreas. Extreme upper abdominal pain. History of pancreatitis  EXAM: CT ABDOMEN AND PELVIS WITH CONTRAST  TECHNIQUE: Multidetector CT imaging of the abdomen and pelvis was performed using the standard protocol following bolus administration of intravenous contrast.  CONTRAST:  OMNIPAQUE IOHEXOL 300 MG/ML  SOLN  COMPARISON:  DG ABD 1 VIEW dated 10/01/2013; CT ABD-PELV W/ CM dated 09/25/2013  FINDINGS: Vague nodular infiltration in the lung bases is similar to previous study. Surgical absence of the gallbladder. Fatty atrophy of the pancreas. No developing pancreatic ductal dilatation, bile duct dilatation, peripancreatic infiltration, or peripancreatic fluid collection. Mild fatty infiltration of the liver. Prominent spleen size.  The adrenal glands, kidneys, abdominal aorta, inferior vena cava, and retroperitoneal lymph nodes are unremarkable. The stomach,  small bowel, and colon are not abnormally distended. There is suggestion of wall thickening in the ascending colon. This could be due to 100 distention but focal colitis is not excluded. No free air or free fluid in the abdomen.  Pelvis: Appendix is normal. No diverticulitis. Bladder wall is not thickened. No mass or lymphadenopathy in the pelvis. No destructive bone lesions.  IMPRESSION: No developing inflammatory changes around the pancreas. Diffuse fatty replacement of the pancreas. Possible wall thickening in the ascending colon may be due to under distention but focal colitis is not excluded. Examination is otherwise unchanged since previous study.   Electronically Signed   By: Burman NievesWilliam  Stevens M.D.   On: 10/03/2013 23:46    Microbiology: No results found for this or any previous visit (from the past 240 hour(s)).   Labs: Basic Metabolic Panel:  Recent Labs Lab  10/01/13 1703 10/02/13 0403 10/03/13 0547  NA 137 142 140  K 4.0 3.7 3.8  CL 100 106 103  CO2 24 24 24   GLUCOSE 206* 137* 95  BUN 7 6 5*  CREATININE 0.71 0.73 0.77  CALCIUM 9.4 8.2* 8.5   Liver Function Tests:  Recent Labs Lab 10/01/13 1703 10/02/13 0403  AST 22 15  ALT 25 18  ALKPHOS 133* 114  BILITOT 0.7 0.5  PROT 7.4 5.9*  ALBUMIN 4.3 3.3*    Recent Labs Lab 10/01/13 1703 10/02/13 0403  LIPASE 7* 6*   No results found for this basename: AMMONIA,  in the last 168 hours CBC:  Recent Labs Lab 10/01/13 1703 10/02/13 0403 10/03/13 0547  WBC 11.6* 5.8 6.1  NEUTROABS 9.6* 3.3  --   HGB 14.8 12.6* 13.7  HCT 41.3 34.9* 39.1  MCV 88.6 88.8 90.3  PLT 222 148* 155   Cardiac Enzymes: No results found for this basename: CKTOTAL, CKMB, CKMBINDEX, TROPONINI,  in the last 168 hours BNP: BNP (last 3 results) No results found for this basename: PROBNP,  in the last 8760 hours CBG:  Recent Labs Lab 10/04/13 2004 10/05/13 0025 10/05/13 0401 10/05/13 0919 10/05/13 1152  GLUCAP 246* 137* 116* 217* 158*     Signed:  Meredeth IdeGagan S Naim Murtha  [facility] 10/05/2013, 12:55 PM

## 2013-10-05 NOTE — Care Management Note (Signed)
CARE MANAGEMENT NOTE 10/05/2013  Patient:  [name],[name]   Account Number:  [id number]  Date Initiated:  10/05/2013  Documentation initiated by:  Vance PeperBRADY,Tiwatope Emmitt  Subjective/Objective Assessment:   33 yr old male admitted with Colitis. HX of cystic fibrosis.     Action/Plan:   Case manager spoke with patient concerning need for PCP. Appointment scheduled with [facility] . Wed.May 6,2015@15 :15am with Dr. Izola PriceMyers.   Anticipated DC Date:  10/05/2013   Anticipated DC Plan:  HOME/SELF CARE      DC Planning Services  CM consult  [facility]  Follow-up appt scheduled      [facility]  NA   Choice offered to / List presented to:             Status of service:  Completed, signed off Medicare Important Message given?   (If response is "NO", the following Medicare IM given date fields will be blank) Date Medicare IM given:   Date Additional Medicare IM given:    Discharge Disposition:  HOME/SELF CARE

## 2013-10-05 NOTE — Discharge Instructions (Signed)
Abdominal Pain, Adult °Many things can cause abdominal pain. Usually, abdominal pain is not caused by a disease and will improve without treatment. It can often be observed and treated at home. Your health care provider will do a physical exam and possibly order blood tests and X-rays to help determine the seriousness of your pain. However, in many cases, more time must pass before a clear cause of the pain can be found. Before that point, your health care provider may not know if you need more testing or further treatment. °HOME CARE INSTRUCTIONS  °Monitor your abdominal pain for any changes. The following actions may help to alleviate any discomfort you are experiencing: °· Only take over-the-counter or prescription medicines as directed by your health care provider. °· Do not take laxatives unless directed to do so by your health care provider. °· Try a clear liquid diet (broth, tea, or water) as directed by your health care provider. Slowly move to a bland diet as tolerated. °SEEK MEDICAL CARE IF: °· You have unexplained abdominal pain. °· You have abdominal pain associated with nausea or diarrhea. °· You have pain when you urinate or have a bowel movement. °· You experience abdominal pain that wakes you in the night. °· You have abdominal pain that is worsened or improved by eating food. °· You have abdominal pain that is worsened with eating fatty foods. °SEEK IMMEDIATE MEDICAL CARE IF:  °· Your pain does not go away within 2 hours. °· You have a fever. °· You keep throwing up (vomiting). °· Your pain is felt only in portions of the abdomen, such as the right side or the left lower portion of the abdomen. °· You pass bloody or black tarry stools. °MAKE SURE YOU: °· Understand these instructions.   °· Will watch your condition.   °· Will get help right away if you are not doing well or get worse.   °Document Released: 03/11/2005 Document Revised: 03/22/2013 Document Reviewed: 02/08/2013 °[employer]® Patient  Information ©2014 [employer]. ° °

## 2013-10-10 ENCOUNTER — Emergency Department (HOSPITAL_COMMUNITY): Payer: BC Managed Care – PPO

## 2013-10-10 ENCOUNTER — Encounter (HOSPITAL_COMMUNITY): Payer: Self-pay | Admitting: Emergency Medicine

## 2013-10-10 ENCOUNTER — Inpatient Hospital Stay (HOSPITAL_COMMUNITY)
Admission: EM | Admit: 2013-10-10 | Discharge: 2013-10-13 | DRG: 439 | Disposition: A | Discharge status: Other Acute Inpatient Hospital | Payer: Federal, State, Local not specified - Other | Attending: Internal Medicine | Admitting: Internal Medicine

## 2013-10-10 DIAGNOSIS — F329 Major depressive disorder, single episode, unspecified: Secondary | ICD-10-CM

## 2013-10-10 DIAGNOSIS — K861 Other chronic pancreatitis: Secondary | ICD-10-CM | POA: Diagnosis present

## 2013-10-10 DIAGNOSIS — K5289 Other specified noninfective gastroenteritis and colitis: Secondary | ICD-10-CM | POA: Diagnosis present

## 2013-10-10 DIAGNOSIS — F32A Depression, unspecified: Secondary | ICD-10-CM

## 2013-10-10 DIAGNOSIS — R109 Unspecified abdominal pain: Secondary | ICD-10-CM | POA: Diagnosis present

## 2013-10-10 DIAGNOSIS — F319 Bipolar disorder, unspecified: Secondary | ICD-10-CM | POA: Diagnosis present

## 2013-10-10 DIAGNOSIS — Z833 Family history of diabetes mellitus: Secondary | ICD-10-CM

## 2013-10-10 DIAGNOSIS — R1110 Vomiting, unspecified: Secondary | ICD-10-CM | POA: Diagnosis present

## 2013-10-10 DIAGNOSIS — K859 Acute pancreatitis without necrosis or infection, unspecified: Principal | ICD-10-CM | POA: Diagnosis present

## 2013-10-10 DIAGNOSIS — R45851 Suicidal ideations: Secondary | ICD-10-CM

## 2013-10-10 DIAGNOSIS — R1115 Cyclical vomiting syndrome unrelated to migraine: Secondary | ICD-10-CM | POA: Diagnosis present

## 2013-10-10 DIAGNOSIS — T1491XA Suicide attempt, initial encounter: Secondary | ICD-10-CM

## 2013-10-10 DIAGNOSIS — Z800 Family history of malignant neoplasm of digestive organs: Secondary | ICD-10-CM

## 2013-10-10 DIAGNOSIS — Z79899 Other long term (current) drug therapy: Secondary | ICD-10-CM

## 2013-10-10 LAB — URINALYSIS, ROUTINE W REFLEX MICROSCOPIC
Glucose, UA: NEGATIVE mg/dL
HGB URINE DIPSTICK: NEGATIVE
Ketones, ur: NEGATIVE mg/dL
Nitrite: NEGATIVE
PROTEIN: NEGATIVE mg/dL
Specific Gravity, Urine: 1.031 — ABNORMAL HIGH (ref 1.005–1.030)
UROBILINOGEN UA: 0.2 mg/dL (ref 0.0–1.0)
pH: 6 (ref 5.0–8.0)

## 2013-10-10 LAB — URINE MICROSCOPIC-ADD ON

## 2013-10-10 LAB — ACETAMINOPHEN LEVEL

## 2013-10-10 LAB — CBC WITH DIFFERENTIAL/PLATELET
BASOS ABS: 0.1 10*3/uL (ref 0.0–0.1)
Basophils Relative: 1 % (ref 0–1)
EOS ABS: 0.1 10*3/uL (ref 0.0–0.7)
Eosinophils Relative: 2 % (ref 0–5)
HCT: 42.7 % (ref 39.0–52.0)
Hemoglobin: 15.1 g/dL (ref 13.0–17.0)
Lymphocytes Relative: 22 % (ref 12–46)
Lymphs Abs: 1.6 10*3/uL (ref 0.7–4.0)
MCH: 31.3 pg (ref 26.0–34.0)
MCHC: 35.4 g/dL (ref 30.0–36.0)
MCV: 88.6 fL (ref 78.0–100.0)
MONO ABS: 0.4 10*3/uL (ref 0.1–1.0)
Monocytes Relative: 6 % (ref 3–12)
Neutro Abs: 5.2 10*3/uL (ref 1.7–7.7)
Neutrophils Relative %: 70 % (ref 43–77)
PLATELETS: 171 10*3/uL (ref 150–400)
RBC: 4.82 MIL/uL (ref 4.22–5.81)
RDW: 12.4 % (ref 11.5–15.5)
WBC: 7.5 10*3/uL (ref 4.0–10.5)

## 2013-10-10 LAB — COMPREHENSIVE METABOLIC PANEL
ALBUMIN: 4.1 g/dL (ref 3.5–5.2)
ALT: 21 U/L (ref 0–53)
AST: 28 U/L (ref 0–37)
Alkaline Phosphatase: 108 U/L (ref 39–117)
BILIRUBIN TOTAL: 0.8 mg/dL (ref 0.3–1.2)
BUN: 9 mg/dL (ref 6–23)
CALCIUM: 9.2 mg/dL (ref 8.4–10.5)
CO2: 25 mEq/L (ref 19–32)
CREATININE: 0.97 mg/dL (ref 0.50–1.35)
Chloride: 103 mEq/L (ref 96–112)
GFR calc Af Amer: >90 mL/min (ref >90)
GFR calc non Af Amer: >90 mL/min (ref >90)
Glucose, Bld: 147 mg/dL — ABNORMAL HIGH (ref 70–99)
Potassium: 4.1 mEq/L (ref 3.7–5.3)
Sodium: 140 mEq/L (ref 137–147)
TOTAL PROTEIN: 7 g/dL (ref 6.0–8.3)

## 2013-10-10 LAB — AMYLASE: AMYLASE: 38 U/L (ref 0–105)

## 2013-10-10 LAB — RAPID URINE DRUG SCREEN, HOSP PERFORMED
Amphetamines: NOT DETECTED
Barbiturates: NOT DETECTED
Benzodiazepines: NOT DETECTED
COCAINE: NOT DETECTED
Opiates: NOT DETECTED
Tetrahydrocannabinol: NOT DETECTED

## 2013-10-10 LAB — ETHANOL

## 2013-10-10 LAB — TROPONIN I

## 2013-10-10 LAB — SALICYLATE LEVEL: Salicylate Lvl: <2 mg/dL — ABNORMAL LOW (ref 2.8–20.0)

## 2013-10-10 LAB — LIPASE, BLOOD: LIPASE: 7 U/L — AB (ref 11–59)

## 2013-10-10 LAB — VALPROIC ACID LEVEL: Valproic Acid Lvl: <10 ug/mL — ABNORMAL LOW (ref 50.0–100.0)

## 2013-10-10 MED ORDER — ONDANSETRON HCL 4 MG PO TABS: 4.0000 mg | ORAL_TABLET | Freq: Four times a day (QID) | ORAL | Status: DC | PRN

## 2013-10-10 MED ORDER — HYDROMORPHONE HCL PF 1 MG/ML IJ SOLN
1.0000 mg | INTRAMUSCULAR | Status: DC | PRN
  Administered 2013-10-10 – 2013-10-12 (×16): 1 mg via INTRAVENOUS
  Filled 2013-10-10 (×16): qty 1

## 2013-10-10 MED ORDER — MORPHINE SULFATE 4 MG/ML IJ SOLN
4.0000 mg | Freq: Once | INTRAMUSCULAR | Status: AC
  Administered 2013-10-10: 4 mg via INTRAVENOUS
  Filled 2013-10-10: qty 1

## 2013-10-10 MED ORDER — ENOXAPARIN SODIUM 40 MG/0.4ML ~~LOC~~ SOLN
40.0000 mg | SUBCUTANEOUS | Status: DC
  Administered 2013-10-10 – 2013-10-12 (×3): 40 mg via SUBCUTANEOUS
  Filled 2013-10-10 (×4): qty 0.4

## 2013-10-10 MED ORDER — ONDANSETRON HCL 4 MG/2ML IJ SOLN
4.0000 mg | Freq: Four times a day (QID) | INTRAMUSCULAR | Status: DC | PRN
  Administered 2013-10-10 – 2013-10-12 (×2): 4 mg via INTRAVENOUS
  Filled 2013-10-10 (×2): qty 2

## 2013-10-10 MED ORDER — CIPROFLOXACIN IN D5W 400 MG/200ML IV SOLN
400.0000 mg | Freq: Two times a day (BID) | INTRAVENOUS | Status: DC
  Administered 2013-10-10 – 2013-10-13 (×6): 400 mg via INTRAVENOUS
  Filled 2013-10-10 (×8): qty 200

## 2013-10-10 MED ORDER — DIVALPROEX SODIUM ER 250 MG PO TB24
250.0000 mg | ORAL_TABLET | Freq: Every day | ORAL | Status: DC
  Administered 2013-10-10 – 2013-10-13 (×4): 250 mg via ORAL
  Filled 2013-10-10 (×4): qty 1

## 2013-10-10 MED ORDER — SODIUM CHLORIDE 0.9 % IV BOLUS (SEPSIS)
1000.0000 mL | Freq: Once | INTRAVENOUS | Status: AC
  Administered 2013-10-10: 1000 mL via INTRAVENOUS

## 2013-10-10 MED ORDER — METRONIDAZOLE IN NACL 5-0.79 MG/ML-% IV SOLN
500.0000 mg | Freq: Three times a day (TID) | INTRAVENOUS | Status: DC
  Administered 2013-10-10 – 2013-10-13 (×9): 500 mg via INTRAVENOUS
  Filled 2013-10-10 (×11): qty 100

## 2013-10-10 MED ORDER — ONDANSETRON 4 MG PO TBDP
4.0000 mg | ORAL_TABLET | Freq: Once | ORAL | Status: AC
  Administered 2013-10-10: 4 mg via ORAL
  Filled 2013-10-10: qty 1

## 2013-10-10 MED ORDER — HYDROMORPHONE HCL PF 1 MG/ML IJ SOLN
1.0000 mg | INTRAMUSCULAR | Status: DC | PRN
  Administered 2013-10-10: 1 mg via INTRAVENOUS
  Filled 2013-10-10: qty 1

## 2013-10-10 MED ORDER — PROMETHAZINE HCL 25 MG/ML IJ SOLN
12.5000 mg | Freq: Four times a day (QID) | INTRAMUSCULAR | Status: DC | PRN
  Administered 2013-10-10 – 2013-10-13 (×12): 12.5 mg via INTRAVENOUS
  Filled 2013-10-10 (×12): qty 1

## 2013-10-10 MED ORDER — PANCRELIPASE (LIP-PROT-AMYL) 12000-38000 UNITS PO CPEP
6.0000 | ORAL_CAPSULE | Freq: Two times a day (BID) | ORAL | Status: DC
  Administered 2013-10-13: 6 via ORAL
  Filled 2013-10-10 (×7): qty 6

## 2013-10-10 MED ORDER — SODIUM CHLORIDE 0.9 % IV SOLN
INTRAVENOUS | Status: DC
  Administered 2013-10-10 – 2013-10-12 (×4): via INTRAVENOUS

## 2013-10-10 MED ORDER — ONDANSETRON HCL 4 MG/2ML IJ SOLN
4.0000 mg | Freq: Once | INTRAMUSCULAR | Status: AC
  Administered 2013-10-10: 4 mg via INTRAVENOUS
  Filled 2013-10-10: qty 2

## 2013-10-10 NOTE — ED Notes (Signed)
Patient vomited after given po Zofran.

## 2013-10-10 NOTE — Progress Notes (Signed)
[facility] CL provided pt with a [employer] Orange Card application, highlighting [facility].  °

## 2013-10-10 NOTE — ED Notes (Signed)
Report called to floor.  Nurse unavailable, will call back.

## 2013-10-10 NOTE — H&P (Signed)
History and Physical     Jeffrey LickSpencer Hanif ZOX:[id number] DOB: 09-30-1980 DOA: 10/10/2013    Referring physician: Dr. Rubin PayorPickering PCP: Debbora PrestoMAGICK-MYERS, ISKRA, MD  Specialists: psychiatry   Chief Complaint: Abdominal pain, nausea and vomiting    HPI: [name] is a 33 y.o. male has a past medical history significant for cystic fibrosis, chronic pancreatitis in the setting of alcohol abuse presents to the emergency room with a chief complaint of severe epigastric abdominal pain the radiating into his back, associated with nausea and vomiting. Patient was recently admitted on 4/19 and discharged on 4/23 with similar symptoms. He states that he is feeling a bit better at the time of discharge, however over the last few days he experienced increased abdominal pain. He states that his home oxycodone is not helping his pain, and on Saturday tried "to end it all" and took 15 tablets with the intent of suicide. He still feels suicidal right now. Less than he was in the hospital, psychiatry was consulted. Patient currently denies any chest pain or breathing difficulties. He has no lightheadedness or dizziness. He endorses few diarrheal episodes per day, which have been chronic for him with his history of CF. Last time he was hospitalized, he was diagnosed with colitis based on the CT scan, and was started on Ciprofloxacin and metronidazole which he is still taking. In the emergency room, his blood work is unremarkable, however patient has intractable nausea vomiting and abdominal pain and hospitalist was asked for admission.    Review of Systems: As per history of present illness, otherwise negative    Past Medical History  Diagnosis Date  . Pancreatitis   . Cystic fibrosis   . Alcohol abuse    Past Surgical History  Procedure Laterality Date  . Cholecystectomy    . Nasal sinus surgery     Social History:  reports that he has never smoked. He does not have any smokeless tobacco history on  file. He reports that he does not drink alcohol or use illicit drugs.  No Known Allergies  Family History  Problem Relation Age of Onset  . Diabetes Mellitus II Other   . Colon cancer Other     Prior to Admission medications   Medication Sig Start Date End Date Taking? Authorizing Provider  ciprofloxacin (CIPRO) 500 MG tablet Take 1 tablet (500 mg total) by mouth 2 (two) times daily. 10/05/13  Yes Meredeth IdeGagan S Lama, MD  divalproex (DEPAKOTE ER) 250 MG 24 hr tablet Take 1 tablet (250 mg total) by mouth daily. 10/05/13  Yes Meredeth IdeGagan S Lama, MD  lipase/protease/amylase (CREON-12/PANCREASE) 12000 UNITS CPEP capsule Take 6 capsules by mouth 2 (two) times daily.   Yes Historical Provider, MD  metroNIDAZOLE (FLAGYL) 500 MG tablet Take 1 tablet (500 mg total) by mouth 3 (three) times daily. 10/05/13  Yes Meredeth IdeGagan S Lama, MD   Physical Exam: Filed Vitals:   10/10/13 1146  BP: 131/86  Pulse: 86  Temp: 98.3 F (36.8 C)  TempSrc: Oral  Resp: 18  SpO2: 99%    General:  In mild distress, sitting upright in bed   Eyes: no scleral icterus   ENT: moist oropharynx   Neck: supple, no JVD   Cardiovascular: regular rate without MRG; 2+ peripheral pulses   Respiratory: CTA biL, good air movement without wheezing, rhonchi or crackled   Abdomen: soft, diffusely tender to palpation mainly in the epigastric area  Skin: no rashes   Musculoskeletal: no peripheral edema   Psychiatric: normal  mood and affect   Neurologic: non focal  Labs on Admission:  Basic Metabolic Panel:  Recent Labs Lab 10/10/13 1200  NA 140  K 4.1  CL 103  CO2 25  GLUCOSE 147*  BUN 9  CREATININE 0.97  CALCIUM 9.2   Liver Function Tests:  Recent Labs Lab 10/10/13 1200  AST 28  ALT 21  ALKPHOS 108  BILITOT 0.8  PROT 7.0  ALBUMIN 4.1    Recent Labs Lab 10/10/13 1200 10/10/13 1401  LIPASE 7*  --   AMYLASE  --  38   No results found for this basename: AMMONIA,  in the last 168 hours CBC:  Recent  Labs Lab 10/10/13 1200  WBC 7.5  NEUTROABS 5.2  HGB 15.1  HCT 42.7  MCV 88.6  PLT 171   Cardiac Enzymes:  Recent Labs Lab 10/10/13 1200  TROPONINI <0.30    BNP (last 3 results) No results found for this basename: PROBNP,  in the last 8760 hours CBG:  Recent Labs Lab 10/04/13 2004 10/05/13 0025 10/05/13 0401 10/05/13 0919 10/05/13 1152  GLUCAP 246* 137* 116* 217* 158*    Radiological Exams on Admission: [place] Abdomen Complete  10/10/2013   CLINICAL DATA:  Abdominal pain. History of pancreatitis. History of cholecystectomy.  EXAM: ULTRASOUND ABDOMEN COMPLETE  COMPARISON:  CT 10/03/2013  FINDINGS: Gallbladder:  Surgically absent  Common bile duct:  Diameter: Normal at 4 mm  Liver:  Liver is mildly increased in parenchymal echogenicity. Mild lobular contour. No duct dilatation  IVC:  No abnormality visualized.  Pancreas:  Visualized portion unremarkable.  Spleen:  Size and appearance within normal limits.  Right Kidney:  Length: 10.1. Echogenicity within normal limits. No mass or hydronephrosis visualized.  Left Kidney:  Length: 11.9. Echogenicity within normal limits. No mass or hydronephrosis visualized.  Abdominal aorta:  No aneurysm visualized.  Other findings:  No free fluid  IMPRESSION: 1. No acute findings in the abdomen by ultrasound. 2. Mildly echogenic liver consistent with mild hepatic steatosis versus hepatocellular disease.   Electronically Signed   By: Genevive BiStewart  Edmunds M.D.   On: 10/10/2013 14:11   Dg Abd Acute W/chest  10/10/2013   CLINICAL DATA:  Abdominal pain, nausea/vomiting/diarrhea, history of pancreatitis an cystic fibrosis.  EXAM: ACUTE ABDOMEN SERIES (ABDOMEN 2 VIEW & CHEST 1 VIEW)  COMPARISON:  CT abdomen pelvis dated 10/03/2013. Chest radiographs dated 10/01/2013.  FINDINGS: Chronic interstitial markings with right upper lobe predominant fibrosis, compatible with known cystic fibrosis. No pleural effusion or pneumothorax.  The heart is normal in size.   Nonobstructive bowel gas pattern.  No evidence of free air under the diaphragm on the upright view.  Cholecystectomy clips.  Visualized osseous structures are within normal limits.  IMPRESSION: Stable chronic interstitial markings/fibrosis related to known cystic fibrosis.  No evidence of small bowel obstruction or free air.   Electronically Signed   By: Charline BillsSriyesh  Krishnan M.D.   On: 10/10/2013 13:40    Assessment/Plan Principal Problem:   Chronic pancreatitis Active Problems:   Pancreatitis   Abdominal pain   Cystic fibrosis   Intractable vomiting   Suicidal ideation    Acute on chronic pancreatitis - patient had a CT scan last week, and other than mild colitis he was unremarkable. Symptoms are consistent with chronic pancreatitis. We'll provide pain control, IV fluids, n.p.o.   Suicidal ideation - psychiatry has been re-consulted, he is here voluntary for now. Suicide precautions.    Colitis - diagnosed on the CT last week.  He was discharged on metronidazole and ciprofloxacin. Was supposed to get antibiotics for 7 additional days from 4/23, with stop date 4/13. Continue these antibiotics here for 2 additional days. - check C diff   Cystic fibrosis - this appears to be stable   Bipolar disorder - patient was seen by psychiatry last time he was started on Depakote 250 mg daily     Diet: NPO Fluids: NS DVT Prophylaxis: Lovenox   Code Status: Full  Family Communication: none  Disposition Plan: inpatient  Time spent: 42  This note has been created with Education officer, environmental. Any transcriptional errors are unintentional.   Dametrius Sanjuan M. Elvera Lennox, MD [facility] Pager [phone]  If 7PM-7AM, please contact night-coverage [email or web address] Password [facility] 10/10/2013, 3:29 PM

## 2013-10-10 NOTE — ED Notes (Signed)
Pt father requesting to see patient in triage; pt states "absolutely not" when asked if his dad could come see him; states to tell his dad to go home; father informed of pt wishes

## 2013-10-10 NOTE — ED Notes (Signed)
Report called to Kristin RN

## 2013-10-10 NOTE — ED Notes (Signed)
Bed: WA01 Expected date:  Expected time:  Means of arrival:  Comments: Hip dislocation/fx

## 2013-10-10 NOTE — ED Provider Notes (Signed)
CSN: [id number]     Arrival date & time 10/10/13  1123 History   First MD Initiated Contact with Patient 10/10/13 1154     Chief Complaint  Patient presents with  . Abdominal Pain  . Suicidal     (Consider location/radiation/quality/duration/timing/severity/associated sxs/prior Treatment) The history is provided by the patient. No language interpreter was used.  [name] is a 33 year old male with past medical history of cystic fibrosis, alcohol abuse, percutaneous presenting to the ED with abdominal pain and suicidal ideation. As per patient, reported that the abdominal pain has been ongoing from a couple of years, reported that over the past couple weeks the abdominal pain is gone progressively worse. Reported that the discomfort is localized to the upper abdomen, just above the belly button described as an intense aching sensation with radiation towards his back that is constant. Patient reported that he's been having diarrhea. Stated that he's been having increased nausea with at least 1-2 episodes of emesis yesterday-NB/NB. Reported that due to the pain being constant and not getting any better patient reported that he tried to attempt suicide on Saturday where he took approximately 15 oxycodone- 5-325 mg tablets Percocets. Reported that this is not his first attempt, reported that his first attempt was back in February 2015 where he took a bunch of Xanax, was admitted to the psychiatric department and discharged to move back to [place] from [place] to live with his parents. Stated that he never got over the breaking of him and his fiance in November of 2014. Stated that he has been taking his medications-Creon as prescribed for the pancreatitis. Reported that he's still stressed out at the loss of his fiance, increased abdominal pain, stated that he does has not taken anymore. Denied fever, chills, sweating, chest pain, shortness of breath, difficulty breathing, numbness, tingling,  blurred vision, sudden loss of vision, melena, hematochezia, neck pain, neck stiffness, hematuria, auditory visual hallucinations, homicidal ideation, lesions. PCP none GI none  Past Medical History  Diagnosis Date  . Pancreatitis   . Cystic fibrosis   . Alcohol abuse    Past Surgical History  Procedure Laterality Date  . Cholecystectomy    . Nasal sinus surgery     Family History  Problem Relation Age of Onset  . Diabetes Mellitus II Other   . Colon cancer Other    History  Substance Use Topics  . Smoking status: Never Smoker   . Smokeless tobacco: Not on file  . Alcohol Use: No     Comment: Has not had alcohol since March 31st 2015. Used to be heavy alcohol drinker.    Review of Systems  Constitutional: Negative for fever, chills and diaphoresis.  Respiratory: Negative for chest tightness and shortness of breath.   Cardiovascular: Negative for chest pain.  Gastrointestinal: Positive for nausea, vomiting, abdominal pain and diarrhea. Negative for constipation, blood in stool and anal bleeding.  Genitourinary: Negative for dysuria, hematuria and decreased urine volume.  Musculoskeletal: Positive for back pain. Negative for neck pain and neck stiffness.  Neurological: Negative for dizziness, weakness, light-headedness, numbness and headaches.  Psychiatric/Behavioral: Positive for suicidal ideas, sleep disturbance, self-injury and dysphoric mood. Negative for hallucinations and behavioral problems. The patient is not nervous/anxious.   All other systems reviewed and are negative.     Allergies  Review of patient's allergies indicates no known allergies.  Home Medications   Prior to Admission medications   Medication Sig Start Date End Date Taking? Authorizing Provider  ciprofloxacin (CIPRO) 500  MG tablet Take 1 tablet (500 mg total) by mouth 2 (two) times daily. 10/05/13  Yes Meredeth Ide, MD  divalproex (DEPAKOTE ER) 250 MG 24 hr tablet Take 1 tablet (250 mg total) by  mouth daily. 10/05/13  Yes Meredeth Ide, MD  lipase/protease/amylase (CREON-12/PANCREASE) 12000 UNITS CPEP capsule Take 6 capsules by mouth 2 (two) times daily.   Yes Historical Provider, MD  metroNIDAZOLE (FLAGYL) 500 MG tablet Take 1 tablet (500 mg total) by mouth 3 (three) times daily. 10/05/13  Yes Meredeth Ide, MD   BP 131/86  Pulse 86  Temp(Src) 98.3 F (36.8 C) (Oral)  Resp 18  SpO2 99% Physical Exam  Nursing note and vitals reviewed. Constitutional: He is oriented to person, place, and time. He appears well-developed.  Patient appears very uncomfortable, in a fetal position sitting upright in a chair  HENT:  Head: Normocephalic.  Mouth/Throat: Oropharynx is clear and moist. No oropharyngeal exudate.  Eyes: Conjunctivae and EOM are normal. Pupils are equal, round, and reactive to light. Right eye exhibits no discharge. Left eye exhibits no discharge.  Neck: Normal range of motion. Neck supple. No tracheal deviation present.  Cardiovascular: Normal rate, regular rhythm and normal heart sounds.  Exam reveals no friction rub.   No murmur heard. Pulses:      Radial pulses are 2+ on the right side, and 2+ on the left side.       Dorsalis pedis pulses are 2+ on the right side, and 2+ on the left side.  Pulmonary/Chest: Effort normal and breath sounds normal. No respiratory distress. He has no wheezes. He has no rales.  Abdominal: Soft. Bowel sounds are normal. He exhibits no distension. There is tenderness in the right upper quadrant and epigastric area. There is guarding. There is no rebound.  Negative abdominal distention Negative peritoneal signs Diffuse discomfort upon palpation to the abdomen in all quadrants - most discomfort upon palpation to the epigastric region  Musculoskeletal: Normal range of motion.  Full ROM to upper and lower extremities without difficulty noted, negative ataxia noted.  Lymphadenopathy:    He has no cervical adenopathy.  Neurological: He is alert and  oriented to person, place, and time. No cranial nerve deficit. He exhibits normal muscle tone. Coordination normal.  Cranial nerves III-XII grossly intact Strength 5+/5+ to upper and lower extremities bilaterally with resistance applied, equal distribution noted Equal grip strength Gait proper, proper balance - negative sway, negative drift, negative step-offs  Skin: Skin is warm and dry. No rash noted. He is not diaphoretic. No erythema.  Psychiatric:  Flat affect Poor eye contact Saddened tone    ED Course  Procedures (including critical care time)  1:13 PM This provider spoke [place], [employer] - discussed case, labs, vitals in great detail with Poison control. Oxycodone would be long gone by now. Since this occurred 2 days, everything is okay and patient does not need to be monitored.   3:14 PM This provider spoke with Dr. Lafe Garin - discussed case, history, presentation, labs, imaging in great detail. Patient be admitted to MedSurg for intractable vomiting and chronic increase status for fluids as well as pain control.  CLINICAL DATA: Re-evaluation of the pancreas. Extreme upper  abdominal pain. History of pancreatitis  EXAM:  CT ABDOMEN AND PELVIS WITH CONTRAST  TECHNIQUE:  Multidetector CT imaging of the abdomen and pelvis was performed  using the standard protocol following bolus administration of  intravenous contrast.  CONTRAST: OMNIPAQUE IOHEXOL 300 MG/ML SOLN  COMPARISON: DG ABD 1 VIEW dated 10/01/2013; CT ABD-PELV W/ CM dated  09/25/2013  FINDINGS:  Vague nodular infiltration in the lung bases is similar to previous  study. Surgical absence of the gallbladder. Fatty atrophy of the  pancreas. No developing pancreatic ductal dilatation, bile duct  dilatation, peripancreatic infiltration, or peripancreatic fluid  collection. Mild fatty infiltration of the liver. Prominent spleen  size.  The adrenal glands, kidneys, abdominal aorta, inferior vena cava,  and  retroperitoneal lymph nodes are unremarkable. The stomach, small  bowel, and colon are not abnormally distended. There is suggestion  of wall thickening in the ascending colon. This could be due to 100  distention but focal colitis is not excluded. No free air or free  fluid in the abdomen.  Pelvis: Appendix is normal. No diverticulitis. Bladder wall is not  thickened. No mass or lymphadenopathy in the pelvis. No destructive  bone lesions.  IMPRESSION:  No developing inflammatory changes around the pancreas. Diffuse  fatty replacement of the pancreas. Possible wall thickening in the  ascending colon may be due to under distention but focal colitis is  not excluded. Examination is otherwise unchanged since previous  study.  Electronically Signed  By: Burman Nieves M.D.  On: 10/03/2013 23:46  Labs Review Labs Reviewed  COMPREHENSIVE METABOLIC PANEL - Abnormal; Notable for the following:    Glucose, Bld 147 (*)    All other components within normal limits  SALICYLATE LEVEL - Abnormal; Notable for the following:    Salicylate Lvl <2.0 (*)    All other components within normal limits  LIPASE, BLOOD - Abnormal; Notable for the following:    Lipase 7 (*)    All other components within normal limits  VALPROIC ACID LEVEL - Abnormal; Notable for the following:    Valproic Acid Lvl <10.0 (*)    All other components within normal limits  URINALYSIS, ROUTINE W REFLEX MICROSCOPIC - Abnormal; Notable for the following:    Color, Urine AMBER (*)    APPearance CLOUDY (*)    Specific Gravity, Urine 1.031 (*)    Bilirubin Urine SMALL (*)    Leukocytes, UA SMALL (*)    All other components within normal limits  URINE MICROSCOPIC-ADD ON - Abnormal; Notable for the following:    Bacteria, UA FEW (*)    Crystals CA OXALATE CRYSTALS (*)    All other components within normal limits  ACETAMINOPHEN LEVEL  ETHANOL  URINE RAPID DRUG SCREEN (HOSP PERFORMED)  CBC WITH DIFFERENTIAL  TROPONIN I   AMYLASE    Imaging Review US Abdomen Complete  10/10/2013   CLINICAL DATA:  Abdominal pain. History of pancreatitis. History of cholecystectomy.  EXAM: ULTRASOUND ABDOMEN COMPLETE  COMPARISON:  CT 10/03/2013  FINDINGS: Gallbladder:  Surgically absent  Common bile duct:  Diameter: Normal at 4 mm  Liver:  Liver is mildly increased in parenchymal echogenicity. Mild lobular contour. No duct dilatation  IVC:  No abnormality visualized.  Pancreas:  Visualized portion unremarkable.  Spleen:  Size and appearance within normal limits.  Right Kidney:  Length: 10.1. Echogenicity within normal limits. No mass or hydronephrosis visualized.  Left Kidney:  Length: 11.9. Echogenicity within normal limits. No mass or hydronephrosis visualized.  Abdominal aorta:  No aneurysm visualized.  Other findings:  No free fluid  IMPRESSION: 1. No acute findings in the abdomen by ultrasound. 2. Mildly echogenic liver consistent with mild hepatic steatosis versus hepatocellular disease.   Electronically Signed   By: Genevive Bi M.D.   On:  10/10/2013 14:11   Dg Abd Acute W/chest  10/10/2013   CLINICAL DATA:  Abdominal pain, nausea/vomiting/diarrhea, history of pancreatitis an cystic fibrosis.  EXAM: ACUTE ABDOMEN SERIES (ABDOMEN 2 VIEW & CHEST 1 VIEW)  COMPARISON:  CT abdomen pelvis dated 10/03/2013. Chest radiographs dated 10/01/2013.  FINDINGS: Chronic interstitial markings with right upper lobe predominant fibrosis, compatible with known cystic fibrosis. No pleural effusion or pneumothorax.  The heart is normal in size.  Nonobstructive bowel gas pattern.  No evidence of free air under the diaphragm on the upright view.  Cholecystectomy clips.  Visualized osseous structures are within normal limits.  IMPRESSION: Stable chronic interstitial markings/fibrosis related to known cystic fibrosis.  No evidence of small bowel obstruction or free air.   Electronically Signed   By: Charline BillsSriyesh  Krishnan M.D.   On: 10/10/2013 13:40     EKG  Interpretation   Date/Time:  Tuesday October 10 2013 12:21:07 EDT Ventricular Rate:  84 PR Interval:  158 QRS Duration: 89 QT Interval:  361 QTC Calculation: 427 R Axis:   92 Text Interpretation:  Sinus rhythm Borderline right axis deviation ST  elevation suggests acute pericarditis Confirmed by Rubin PayorPICKERING  MD, NATHAN  [phone]([id number]) on 10/10/2013 12:27:21 PM      MDM   Final diagnoses:  Intractable vomiting  Chronic pancreatitis  Abdominal pain  Depression  Suicidal ideation  Attempted suicide  Cystic fibrosis   Medications  sodium chloride 0.9 % bolus 1,000 mL (not administered)  sodium chloride 0.9 % bolus 1,000 mL (1,000 mLs Intravenous New Bag/Given 10/10/13 1338)  ondansetron (ZOFRAN-ODT) disintegrating tablet 4 mg (4 mg Oral Given 10/10/13 1338)  ondansetron (ZOFRAN) injection 4 mg (4 mg Intravenous Given 10/10/13 1411)  morphine 4 MG/ML injection 4 mg (4 mg Intravenous Given 10/10/13 1426)    Filed Vitals:   10/10/13 1146  BP: 131/86  Pulse: 86  Temp: 98.3 F (36.8 C)  TempSrc: Oral  Resp: 18  SpO2: 99%   This provider reviewed patient's chart. Patient has a long history of abdominal pain. Patient was recently seen in the ED setting regarding abdominal pain we are in he was admitted to the hospital, 10/01/2013. CT abdomen and pelvis was performed on 10/03/2013 were no developing inflammatory changes around the pancreas are identified-diffuse fatty replacement of the pancreas was noted with possible wall thickening in the ascending colon is suggestive for focal colitis. Unremarkable CT abdomen pelvis. Patient was monitored with lauded and Phenergan. Patient was discharged home with Percocets 5-325 mg tablets. Patient was discharged on 10/05/2013. While being admitted to the hospital patient was seen and assessed by psychiatry were resources were administered to patient has not used sources. Negative abdominal distention noted. Diffuse tenderness upon palpation to the abdomen with  most discomfort to the epigastric region. EKG noted normal sinus rhythm with a heart rate of 84 beats per minute with a right axis deviation-elevation suggests acute pericarditis. Troponin negative elevation. CBC negative elevated white blood cell count identified - negative left shift or leukocytosis noted. CMP noted normal functioning kidneys and liver-negative elevated alkaline phosphatase. Lipase negative elevation. Amylase negative elevation. Ethanol negative elevation. Salicylate is negative elevation. Acetaminophen negative elevation. Valproic acid less than 10-patient has not been taking his medications. Urinalysis noted elevated specific gravity of 1.031 with small trace of leukocytes with positive oxalate crystals noted. Urine drug screen negative findings. Acute abdomen with chest stable chronic interstitial markings/fibrosis associated with cystic fibrosis. No evidence of small bowel structure and are free air. Ultrasound of the  abdomen negative for acute abnormalities noted-mildly echogenic liver consistent with mild hepatic stenosis versus hepatocellular disease. Cholecystectomy noted. Fully alert and oriented. This provider spoke with poison control-reported that since the event occurred approximately 2 days ago with negative involvement of the liver functioning and acetaminophen level negative elevation with proper pulse ox and vitals patient is cleared and okay.  Labs and imaging unremarkable. Patient unable to keep food or fluids down while in ED setting-emesis while in ED setting. Patient is medically cleared. Patient to be admitted to hospital for intractable vomiting and chronic pancreatitis-for pain control as well. Discussed plan for admission. Patient agreed. Patient to be admitted to MedSurg, discussed case with Dr. Lafe GarinGherge.  Raymon MuttonMarissa Kassady Laboy, PA-C 10/10/13 2127

## 2013-10-10 NOTE — ED Notes (Signed)
Pt c/o ongoing abd pain that radiates towards his back and suicidal ideations to lead him to taking bunch of oxycodone the other night and "nothing came of it".  Pt staes that him and his long time fiance broke it off in December, so that and the constant abd pain makes him not want to live anymore.

## 2013-10-11 DIAGNOSIS — R109 Unspecified abdominal pain: Secondary | ICD-10-CM

## 2013-10-11 DIAGNOSIS — T50901A Poisoning by unspecified drugs, medicaments and biological substances, accidental (unintentional), initial encounter: Secondary | ICD-10-CM

## 2013-10-11 DIAGNOSIS — X838XXA Intentional self-harm by other specified means, initial encounter: Secondary | ICD-10-CM

## 2013-10-11 DIAGNOSIS — F319 Bipolar disorder, unspecified: Secondary | ICD-10-CM

## 2013-10-11 DIAGNOSIS — K861 Other chronic pancreatitis: Secondary | ICD-10-CM

## 2013-10-11 DIAGNOSIS — T50902A Poisoning by unspecified drugs, medicaments and biological substances, intentional self-harm, initial encounter: Secondary | ICD-10-CM

## 2013-10-11 LAB — COMPREHENSIVE METABOLIC PANEL
ALT: 17 U/L (ref 0–53)
AST: 24 U/L (ref 0–37)
Albumin: 3 g/dL — ABNORMAL LOW (ref 3.5–5.2)
Alkaline Phosphatase: 78 U/L (ref 39–117)
BUN: 6 mg/dL (ref 6–23)
CALCIUM: 8 mg/dL — AB (ref 8.4–10.5)
CO2: 23 meq/L (ref 19–32)
CREATININE: 0.83 mg/dL (ref 0.50–1.35)
Chloride: 109 mEq/L (ref 96–112)
GLUCOSE: 102 mg/dL — AB (ref 70–99)
Potassium: 3.7 mEq/L (ref 3.7–5.3)
Sodium: 141 mEq/L (ref 137–147)
Total Bilirubin: 0.5 mg/dL (ref 0.3–1.2)
Total Protein: 5.2 g/dL — ABNORMAL LOW (ref 6.0–8.3)

## 2013-10-11 LAB — CBC
HCT: 34.7 % — ABNORMAL LOW (ref 39.0–52.0)
HEMOGLOBIN: 12.2 g/dL — AB (ref 13.0–17.0)
MCH: 31.3 pg (ref 26.0–34.0)
MCHC: 34.9 g/dL (ref 30.0–36.0)
MCV: 89.9 fL (ref 78.0–100.0)
Platelets: 120 10*3/uL — ABNORMAL LOW (ref 150–400)
RBC: 3.86 MIL/uL — AB (ref 4.22–5.81)
RDW: 12.3 % (ref 11.5–15.5)
WBC: 5 10*3/uL (ref 4.0–10.5)

## 2013-10-11 LAB — CLOSTRIDIUM DIFFICILE BY PCR: Toxigenic C. Difficile by PCR: NEGATIVE
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
## 2013-10-11 NOTE — Progress Notes (Signed)
PROGRESS NOTE  [name] NWG:[id number] DOB: 23-Nov-1980 DOA: 10/10/2013 PCP: Debbora Presto, MD  Assessment/Plan: Pancreatitis/Abdominal pain  -Unclear if patient truly has acute exacerbation of his chronic pancreatitis -Lipase = 7 -  CT abdomen/pelvis does not show pancreatic inflammation  - Pain control with opiates  - advance diet with improvement in condition.  - continue pancreatic enzymes with meals  - Pain level out of proportion with physical exam findings  -4/28/15Acute abdominal series negative for any acute intra-abdominal process ? Colitis/abnormal colon thickening  - CT abdomen shows thickening of the ascending colon--?underdistension  - Continue Cipro and Flagyl  -doubt pt picked up Rx from pharmacy--pt very nebulous regarding which pharmacy he uses -c.diff negative -Abdominal ultrasound shows fatty liver without any acute findings Suicidal ideation -Appreciate psychiatry followup -Again doubt that the patient picked up medications from his pharmacy -Continue Depakote 250 mg daily -pt lives with parents -[facility] at [facility] before transfer to [facility] in March 2015 Cystic fibrosis  - stable  -No respiratory distress Bipolar disorder  -Patient was seen by Psych and started on Depakote 250 mg po daily   Family Communication:   Pt at beside Disposition Plan:   TRANSFER TO [facility] WHEN BED AVAILABLE       Procedures/Studies: Dg Chest 2 View  10/01/2013   CLINICAL DATA:  Cystic fibrosis  EXAM: CHEST - 2 VIEW  COMPARISON:  None.  FINDINGS: Lungs are hyper aerated. Linear opacities are seen throughout both central lung zones. Patchy densities are seen in the upper lung zones and central lower lung zones. An inflammatory process is not excluded. Normal heart size. No pneumothorax.  IMPRESSION: Hyperaeration and linear opacities consistent with cystic fibrosis.  Patchy bilateral opacities are noted and an inflammatory process cannot  be excluded without a comparison with prior studies.   Electronically Signed   By: Maryclare Bean M.D.   On: 10/01/2013 23:20   Dg Abd 1 View  10/01/2013   CLINICAL DATA:  Abdominal pain  EXAM: ABDOMEN - 1 VIEW  COMPARISON:  None.  FINDINGS: No disproportionate dilatation of bowel. No obvious free intraperitoneal gas. Moderate stool burden in the colon.  IMPRESSION: Nonobstructive bowel gas pattern.   Electronically Signed   By: Maryclare Bean M.D.   On: 10/01/2013 23:18   US Abdomen Complete  10/10/2013   CLINICAL DATA:  Abdominal pain. History of pancreatitis. History of cholecystectomy.  EXAM: ULTRASOUND ABDOMEN COMPLETE  COMPARISON:  CT 10/03/2013  FINDINGS: Gallbladder:  Surgically absent  Common bile duct:  Diameter: Normal at 4 mm  Liver:  Liver is mildly increased in parenchymal echogenicity. Mild lobular contour. No duct dilatation  IVC:  No abnormality visualized.  Pancreas:  Visualized portion unremarkable.  Spleen:  Size and appearance within normal limits.  Right Kidney:  Length: 10.1. Echogenicity within normal limits. No mass or hydronephrosis visualized.  Left Kidney:  Length: 11.9. Echogenicity within normal limits. No mass or hydronephrosis visualized.  Abdominal aorta:  No aneurysm visualized.  Other findings:  No free fluid  IMPRESSION: 1. No acute findings in the abdomen by ultrasound. 2. Mildly echogenic liver consistent with mild hepatic steatosis versus hepatocellular disease.   Electronically Signed   By: Genevive Bi M.D.   On: 10/10/2013 14:11   Ct Abdomen Pelvis W Contrast  10/03/2013   CLINICAL DATA:  Re-evaluation of the pancreas. Extreme upper abdominal pain. History of pancreatitis  EXAM: CT ABDOMEN AND PELVIS WITH  CONTRAST  TECHNIQUE: Multidetector CT imaging of the abdomen and pelvis was performed using the standard protocol following bolus administration of intravenous contrast.  CONTRAST:  100mL OMNIPAQUE IOHEXOL 300 MG/ML  SOLN  COMPARISON:  DG ABD 1 VIEW dated 10/01/2013; CT  ABD-PELV W/ CM dated 09/25/2013  FINDINGS: Vague nodular infiltration in the lung bases is similar to previous study. Surgical absence of the gallbladder. Fatty atrophy of the pancreas. No developing pancreatic ductal dilatation, bile duct dilatation, peripancreatic infiltration, or peripancreatic fluid collection. Mild fatty infiltration of the liver. Prominent spleen size.  The adrenal glands, kidneys, abdominal aorta, inferior vena cava, and retroperitoneal lymph nodes are unremarkable. The stomach, small bowel, and colon are not abnormally distended. There is suggestion of wall thickening in the ascending colon. This could be due to 100 distention but focal colitis is not excluded. No free air or free fluid in the abdomen.  Pelvis: Appendix is normal. No diverticulitis. Bladder wall is not thickened. No mass or lymphadenopathy in the pelvis. No destructive bone lesions.  IMPRESSION: No developing inflammatory changes around the pancreas. Diffuse fatty replacement of the pancreas. Possible wall thickening in the ascending colon may be due to under distention but focal colitis is not excluded. Examination is otherwise unchanged since previous study.   Electronically Signed   By: Burman NievesWilliam  Stevens M.D.   On: 10/03/2013 23:46   Dg Abd Acute W/chest  10/10/2013   CLINICAL DATA:  Abdominal pain, nausea/vomiting/diarrhea, history of pancreatitis an cystic fibrosis.  EXAM: ACUTE ABDOMEN SERIES (ABDOMEN 2 VIEW & CHEST 1 VIEW)  COMPARISON:  CT abdomen pelvis dated 10/03/2013. Chest radiographs dated 10/01/2013.  FINDINGS: Chronic interstitial markings with right upper lobe predominant fibrosis, compatible with known cystic fibrosis. No pleural effusion or pneumothorax.  The heart is normal in size.  Nonobstructive bowel gas pattern.  No evidence of free air under the diaphragm on the upright view.  Cholecystectomy clips.  Visualized osseous structures are within normal limits.  IMPRESSION: Stable chronic interstitial  markings/fibrosis related to known cystic fibrosis.  No evidence of small bowel obstruction or free air.   Electronically Signed   By: Charline BillsSriyesh  Krishnan M.D.   On: 10/10/2013 13:40         Subjective: Patient continued to complain of abdominal pain but states that it is somewhat better than yesterday. No further vomiting but complained of one loose stool yesterday. Denies any hematochezia, melena, hematemesis. Denies fevers, chills, chest pain or shortness breath, rashes, headache.  Objective: Filed Vitals:   10/10/13 1712 10/10/13 2218 10/11/13 0623 10/11/13 1409  BP: 126/85 131/74 117/71 114/65  Pulse: 74 65 69 58  Temp: 97.9 F (36.6 C) 98.1 F (36.7 C) 97.9 F (36.6 C) 98.2 F (36.8 C)  TempSrc: Oral Oral Oral Oral  Resp: 18 18 18 18   SpO2: 99% 94% 96% 98%    Intake/Output Summary (Last 24 hours) at 10/11/13 1809 Last data filed at 10/11/13 1800  Gross per 24 hour  Intake 3201.67 ml  Output      0 ml  Net 3201.67 ml   Weight change:  Exam:   General:  Pt is alert, follows commands appropriately, not in acute distress  HEENT: No icterus, No thrush,  [place]/AT  Cardiovascular: RRR, S1/S2, no rubs, no gallops  Respiratory: CTA bilaterally, no wheezing, no crackles, no rhonchi  Abdomen: Soft/+BS, diffuse tenderness to palpation with feather touch, non distended, no peritoneal sign  Extremities: No edema, No lymphangitis, No petechiae, No rashes, no synovitis  Data Reviewed: Basic Metabolic Panel:  Recent Labs Lab 10/10/13 1200 10/11/13 0420  NA 140 141  K 4.1 3.7  CL 103 109  CO2 25 23  GLUCOSE 147* 102*  BUN 9 6  CREATININE 0.97 0.83  CALCIUM 9.2 8.0*   Liver Function Tests:  Recent Labs Lab 10/10/13 1200 10/11/13 0420  AST 28 24  ALT 21 17  ALKPHOS 108 78  BILITOT 0.8 0.5  PROT 7.0 5.2*  ALBUMIN 4.1 3.0*    Recent Labs Lab 10/10/13 1200 10/10/13 1401  LIPASE 7*  --   AMYLASE  --  38   No results found for this basename: AMMONIA,  in  the last 168 hours CBC:  Recent Labs Lab 10/10/13 1200 10/11/13 0420  WBC 7.5 5.0  NEUTROABS 5.2  --   HGB 15.1 12.2*  HCT 42.7 34.7*  MCV 88.6 89.9  PLT 171 120*   Cardiac Enzymes:  Recent Labs Lab 10/10/13 1200  TROPONINI <0.30   BNP: No components found with this basename: POCBNP,  CBG:  Recent Labs Lab 10/04/13 2004 10/05/13 0025 10/05/13 0401 10/05/13 0919 10/05/13 1152  GLUCAP 246* 137* 116* 217* 158*    Recent Results (from the past 240 hour(s))  CLOSTRIDIUM DIFFICILE BY PCR     Status: None   Collection Time    10/10/13  8:31 PM      Result Value Ref Range Status   C difficile by pcr NEGATIVE  NEGATIVE Final   Comment: Performed at [facility]     Scheduled Meds: . ciprofloxacin  400 mg Intravenous Q12H  . divalproex  250 mg Oral Daily  . enoxaparin (LOVENOX) injection  40 mg Subcutaneous Q24H  . lipase/protease/amylase  6 capsule Oral BID  . metronidazole  500 mg Intravenous Q8H   Continuous Infusions: . sodium chloride 100 mL/hr at 10/11/13 1559     Catarina Hartshornavid Jermisha Hoffart, DO  [facility] Pager [phone]  If 7PM-7AM, please contact night-coverage [email or web address] Password [email or web address] 10/11/2013, 6:09 PM   LOS: 1 day

## 2013-10-12 ENCOUNTER — Inpatient Hospital Stay (HOSPITAL_COMMUNITY): Payer: BC Managed Care – PPO

## 2013-10-12 LAB — COMPREHENSIVE METABOLIC PANEL
ALK PHOS: 84 U/L (ref 39–117)
ALT: 22 U/L (ref 0–53)
AST: 31 U/L (ref 0–37)
Albumin: 3.2 g/dL — ABNORMAL LOW (ref 3.5–5.2)
BILIRUBIN TOTAL: 0.7 mg/dL (ref 0.3–1.2)
BUN: 4 mg/dL — ABNORMAL LOW (ref 6–23)
CALCIUM: 8.3 mg/dL — AB (ref 8.4–10.5)
CO2: 22 meq/L (ref 19–32)
Chloride: 105 mEq/L (ref 96–112)
Creatinine, Ser: 0.86 mg/dL (ref 0.50–1.35)
GLUCOSE: 91 mg/dL (ref 70–99)
Potassium: 3.4 mEq/L — ABNORMAL LOW (ref 3.7–5.3)
Sodium: 141 mEq/L (ref 137–147)
Total Protein: 5.6 g/dL — ABNORMAL LOW (ref 6.0–8.3)

## 2013-10-12 LAB — CBC WITH DIFFERENTIAL/PLATELET
Basophils Absolute: 0 10*3/uL (ref 0.0–0.1)
Basophils Relative: 1 % (ref 0–1)
EOS PCT: 2 % (ref 0–5)
Eosinophils Absolute: 0.1 10*3/uL (ref 0.0–0.7)
HEMATOCRIT: 35.6 % — AB (ref 39.0–52.0)
Hemoglobin: 12.7 g/dL — ABNORMAL LOW (ref 13.0–17.0)
LYMPHS PCT: 24 % (ref 12–46)
Lymphs Abs: 1.8 10*3/uL (ref 0.7–4.0)
MCH: 31.4 pg (ref 26.0–34.0)
MCHC: 35.7 g/dL (ref 30.0–36.0)
MCV: 88.1 fL (ref 78.0–100.0)
MONO ABS: 0.4 10*3/uL (ref 0.1–1.0)
Monocytes Relative: 5 % (ref 3–12)
Neutro Abs: 5.3 10*3/uL (ref 1.7–7.7)
Neutrophils Relative %: 69 % (ref 43–77)
PLATELETS: 127 10*3/uL — AB (ref 150–400)
RBC: 4.04 MIL/uL — ABNORMAL LOW (ref 4.22–5.81)
RDW: 11.9 % (ref 11.5–15.5)
WBC: 7.6 10*3/uL (ref 4.0–10.5)

## 2013-10-12 MED ORDER — IOHEXOL 300 MG/ML  SOLN
100.0000 mL | Freq: Once | INTRAMUSCULAR | Status: AC | PRN
  Administered 2013-10-12: 100 mL via INTRAVENOUS

## 2013-10-12 MED ORDER — IOHEXOL 300 MG/ML  SOLN
25.0000 mL | INTRAMUSCULAR | Status: AC
  Administered 2013-10-12 (×2): 25 mL via ORAL

## 2013-10-12 MED ORDER — SODIUM CHLORIDE 0.9 % IV SOLN
INTRAVENOUS | Status: DC
  Administered 2013-10-12 – 2013-10-13 (×2): via INTRAVENOUS
  Filled 2013-10-12 (×3): qty 1000

## 2013-10-12 MED ORDER — HYDROMORPHONE HCL PF 1 MG/ML IJ SOLN
1.0000 mg | INTRAMUSCULAR | Status: DC | PRN
  Administered 2013-10-12 – 2013-10-13 (×7): 1 mg via INTRAVENOUS
  Filled 2013-10-12 (×7): qty 1

## 2013-10-12 NOTE — Discharge Summary (Signed)
Physician Discharge Summary  [name] WUJ:[id number] DOB: 03-Apr-1981 DOA: 10/10/2013  PCP: Debbora Presto, MD  Admit date: 10/10/2013 Discharge date: 10/12/2013  Recommendations for Outpatient Follow-up:  1. Pt will need to follow up with PCP in 2 weeks post discharge    Discharge Diagnoses:  Pancreatitis/Abdominal pain  -Unclear if patient truly has acute exacerbation of his chronic pancreatitis  -Lipase = 7, LFTs normal  - CT abdomen/pelvis does not show pancreatic inflammation  - Pain control with opiates  - advanced diet with improvement in condition.  - continue pancreatic enzymes with meals  - Pain level out of proportion with physical exam findings  -4/28/15Acute abdominal series negative for any acute intra-abdominal process  ? Colitis/abnormal colon thickening  - CT abdomen shows thickening of the ascending colon--?underdistension  - Continue Cipro 500 mg twice a day and Flagyl 500 mg 3 times a day x5 additional days, which will complete 7 days of therapy -doubt pt picked up Rx from pharmacy--pt very nebulous regarding which pharmacy he uses  -c.diff negative  -Abdominal ultrasound shows fatty liver without any acute findings, otherwise negative Suicidal ideation  -Appreciate psychiatry followup  -Again doubt that the patient picked up medications from his pharmacy  -Continue Depakote 250 mg daily  -pt lives with parents  -Previously at [facility] inpatient psychiatry before transfer to [facility] in March 2015 -Psychiatry recommended transfer to inpatient psychiatric facility -Social work assisted with transfer to [facility]  Cystic fibrosis  - stable  -No respiratory distress  Bipolar disorder  -Patient was seen by Psych and started on Depakote 250 mg po daily   Discharge Condition: stable  Disposition:  Follow-up Information   Follow up with [facility] .  On 10/18/2013. (Wed.May 6,2015@15 :15am with Dr. Izola Price.  )    Contact information:   [address] [place] [address]      Diet: Low residue  Wt Readings from Last 3 Encounters:  10/05/13 73.8 kg (162 lb 11.2 oz)    History of present illness:  33 y.o. male has a past medical history significant for cystic fibrosis, chronic pancreatitis in the setting of alcohol abuse presents to the emergency room with a chief complaint of severe epigastric abdominal pain the radiating into his back, associated with nausea and vomiting. Patient was recently admitted on 4/19 and discharged on 4/23 with similar symptoms. He states that he is feeling a bit better at the time of discharge, however over the last few days he experienced increased abdominal pain. He states that his home oxycodone was not helping his pain, and on Saturday 10/07/13, he tried "to end it all" and took 15 tablets with the intent of suicide. He still feels suicidal at time of admission.. Last time he was hospitalized, he was diagnosed with colitis based on the CT scan, and was started on Ciprofloxacin and metronidazole which he is still taking. In the emergency room, his blood work is unremarkable, however patient had intractable nausea vomiting and abdominal pain and hospitalist was asked for admission. After the patient was admitted, the patient was started on intravenous opioids and IV fluids. He was initially n.p.o. The patient's pain gradually improved. His vomiting resolved. Although the patient had claimed numerous episodes of vomiting during the admission, none of this was witnessed by his one-on-one sitter or nursing staff. In addition, the patient's pain remained out of proportion with physical exam findings. The patient's diet was advanced without further vomiting. Psychiatry was consulted. They recommended transfer  to behavioral health once the patient was clinically stable. Depakote was continued. The patient was able to tolerate his pills without difficulty. Social work assisted with  transfer to behavioral health.     Discharge Exam: Filed Vitals:   10/12/13 1411  BP: 126/82  Pulse: 72  Temp: 98.2 F (36.8 C)  Resp: 18   Filed Vitals:   10/11/13 1409 10/11/13 2139 10/12/13 0411 10/12/13 1411  BP: 114/65 124/86 115/64 126/82  Pulse: 58 80 71 72  Temp: 98.2 F (36.8 C) 98.3 F (36.8 C) 98.3 F (36.8 C) 98.2 F (36.8 C)  TempSrc: Oral Oral Oral Oral  Resp: 18 18 18 18   SpO2: 98% 97% 93% 93%   General: A&O x 3, NAD, pleasant, cooperative Cardiovascular: RRR, no rub, no gallop, no S3 Respiratory: CTAB, no wheeze, no rhonchi Abdomen:soft, diffusely tender with feather touch, nondistended, positive bowel sounds; no peritoneal sign Extremities: No edema, No lymphangitis, no petechiae  Discharge Instructions      Discharge Orders   Future Orders Complete By Expires   Diet - low sodium heart healthy  As directed    Increase activity slowly  As directed        Medication List         ciprofloxacin 500 MG tablet  Commonly known as:  CIPRO  Take 1 tablet (500 mg total) by mouth 2 (two) times daily.     divalproex 250 MG 24 hr tablet  Commonly known as:  DEPAKOTE ER  Take 1 tablet (250 mg total) by mouth daily.     lipase/protease/amylase [id number] UNITS Cpep capsule  Commonly known as:  CREON-12/PANCREASE  Take 6 capsules by mouth 2 (two) times daily.     metroNIDAZOLE 500 MG tablet  Commonly known as:  FLAGYL  Take 1 tablet (500 mg total) by mouth 3 (three) times daily.         The results of significant diagnostics from this hospitalization (including imaging, microbiology, ancillary and laboratory) are listed below for reference.    Significant Diagnostic Studies: Dg Chest 2 View  10/01/2013   CLINICAL DATA:  Cystic fibrosis  EXAM: CHEST - 2 VIEW  COMPARISON:  None.  FINDINGS: Lungs are hyper aerated. Linear opacities are seen throughout both central lung zones. Patchy densities are seen in the upper lung zones and central lower lung  zones. An inflammatory process is not excluded. Normal heart size. No pneumothorax.  IMPRESSION: Hyperaeration and linear opacities consistent with cystic fibrosis.  Patchy bilateral opacities are noted and an inflammatory process cannot be excluded without a comparison with prior studies.   Electronically Signed   By: Maryclare BeanArt  Hoss M.D.   On: 10/01/2013 23:20   Dg Abd 1 View  10/01/2013   CLINICAL DATA:  Abdominal pain  EXAM: ABDOMEN - 1 VIEW  COMPARISON:  None.  FINDINGS: No disproportionate dilatation of bowel. No obvious free intraperitoneal gas. Moderate stool burden in the colon.  IMPRESSION: Nonobstructive bowel gas pattern.   Electronically Signed   By: Maryclare BeanArt  Hoss M.D.   On: 10/01/2013 23:18   [place] Abdomen Complete  10/10/2013   CLINICAL DATA:  Abdominal pain. History of pancreatitis. History of cholecystectomy.  EXAM: ULTRASOUND ABDOMEN COMPLETE  COMPARISON:  CT 10/03/2013  FINDINGS: Gallbladder:  Surgically absent  Common bile duct:  Diameter: Normal at 4 mm  Liver:  Liver is mildly increased in parenchymal echogenicity. Mild lobular contour. No duct dilatation  IVC:  No abnormality visualized.  Pancreas:  Visualized portion unremarkable.  Spleen:  Size and appearance within normal limits.  Right Kidney:  Length: 10.1. Echogenicity within normal limits. No mass or hydronephrosis visualized.  Left Kidney:  Length: 11.9. Echogenicity within normal limits. No mass or hydronephrosis visualized.  Abdominal aorta:  No aneurysm visualized.  Other findings:  No free fluid  IMPRESSION: 1. No acute findings in the abdomen by ultrasound. 2. Mildly echogenic liver consistent with mild hepatic steatosis versus hepatocellular disease.   Electronically Signed   By: Genevive BiStewart  Edmunds M.D.   On: 10/10/2013 14:11   Ct Abdomen Pelvis W Contrast  10/03/2013   CLINICAL DATA:  Re-evaluation of the pancreas. Extreme upper abdominal pain. History of pancreatitis  EXAM: CT ABDOMEN AND PELVIS WITH CONTRAST  TECHNIQUE:  Multidetector CT imaging of the abdomen and pelvis was performed using the standard protocol following bolus administration of intravenous contrast.  CONTRAST:  100mL OMNIPAQUE IOHEXOL 300 MG/ML  SOLN  COMPARISON:  DG ABD 1 VIEW dated 10/01/2013; CT ABD-PELV W/ CM dated 09/25/2013  FINDINGS: Vague nodular infiltration in the lung bases is similar to previous study. Surgical absence of the gallbladder. Fatty atrophy of the pancreas. No developing pancreatic ductal dilatation, bile duct dilatation, peripancreatic infiltration, or peripancreatic fluid collection. Mild fatty infiltration of the liver. Prominent spleen size.  The adrenal glands, kidneys, abdominal aorta, inferior vena cava, and retroperitoneal lymph nodes are unremarkable. The stomach, small bowel, and colon are not abnormally distended. There is suggestion of wall thickening in the ascending colon. This could be due to 100 distention but focal colitis is not excluded. No free air or free fluid in the abdomen.  Pelvis: Appendix is normal. No diverticulitis. Bladder wall is not thickened. No mass or lymphadenopathy in the pelvis. No destructive bone lesions.  IMPRESSION: No developing inflammatory changes around the pancreas. Diffuse fatty replacement of the pancreas. Possible wall thickening in the ascending colon may be due to under distention but focal colitis is not excluded. Examination is otherwise unchanged since previous study.   Electronically Signed   By: Burman NievesWilliam  Stevens M.D.   On: 10/03/2013 23:46   Dg Abd Acute W/chest  10/10/2013   CLINICAL DATA:  Abdominal pain, nausea/vomiting/diarrhea, history of pancreatitis an cystic fibrosis.  EXAM: ACUTE ABDOMEN SERIES (ABDOMEN 2 VIEW & CHEST 1 VIEW)  COMPARISON:  CT abdomen pelvis dated 10/03/2013. Chest radiographs dated 10/01/2013.  FINDINGS: Chronic interstitial markings with right upper lobe predominant fibrosis, compatible with known cystic fibrosis. No pleural effusion or pneumothorax.  The  heart is normal in size.  Nonobstructive bowel gas pattern.  No evidence of free air under the diaphragm on the upright view.  Cholecystectomy clips.  Visualized osseous structures are within normal limits.  IMPRESSION: Stable chronic interstitial markings/fibrosis related to known cystic fibrosis.  No evidence of small bowel obstruction or free air.   Electronically Signed   By: Charline BillsSriyesh  Krishnan M.D.   On: 10/10/2013 13:40     Microbiology: Recent Results (from the past 240 hour(s))  CLOSTRIDIUM DIFFICILE BY PCR     Status: None   Collection Time    10/10/13  8:31 PM      Result Value Ref Range Status   C difficile by pcr NEGATIVE  NEGATIVE Final   Comment: Performed at [facility]     Labs: Basic Metabolic Panel:  Recent Labs Lab 10/10/13 1200 10/11/13 0420 10/12/13 0336  NA 140 141 141  K 4.1 3.7 3.4*  CL 103 109 105  CO2 25 23 22   GLUCOSE  147* 102* 91  BUN 9 6 4*  CREATININE 0.97 0.83 0.86  CALCIUM 9.2 8.0* 8.3*   Liver Function Tests:  Recent Labs Lab 10/10/13 1200 10/11/13 0420 10/12/13 0336  AST 28 24 31   ALT 21 17 22   ALKPHOS 108 78 84  BILITOT 0.8 0.5 0.7  PROT 7.0 5.2* 5.6*  ALBUMIN 4.1 3.0* 3.2*    Recent Labs Lab 10/10/13 1200 10/10/13 1401  LIPASE 7*  --   AMYLASE  --  38   No results found for this basename: AMMONIA,  in the last 168 hours CBC:  Recent Labs Lab 10/10/13 1200 10/11/13 0420 10/12/13 0336  WBC 7.5 5.0 7.6  NEUTROABS 5.2  --  5.3  HGB 15.1 12.2* 12.7*  HCT 42.7 34.7* 35.6*  MCV 88.6 89.9 88.1  PLT 171 120* 127*   Cardiac Enzymes:  Recent Labs Lab 10/10/13 1200  TROPONINI <0.30   BNP: No components found with this basename: POCBNP,  CBG: No results found for this basename: GLUCAP,  in the last 168 hours  Time coordinating discharge:  Greater than 30 minutes  Signed:  Catarina Hartshorn, DO [facility] Pager: [phone] 10/12/2013, 4:16 PM

## 2013-10-12 NOTE — Progress Notes (Signed)
I was notified by nursing staff that after the patient's diet was advanced to liquids, the patient had dry heaving. Transfer to behavioral health was canceled for today. Repeat CT abdomen and pelvis

## 2013-10-12 NOTE — Progress Notes (Signed)
Clinical Social Work  [facility] RN to call report to [phone]. Patient signed voluntary form which was [facility] and original copy placed on chart. Patient does not want family updated on DC plans but is [facility]. CSW coordinated transportation via [facility] [phone]) for 6 pm pick up.   CSW is signing off but available if further needs arise.  MedinaHolly Anushri Casalino, [place] [phone]

## 2013-10-12 NOTE — Progress Notes (Signed)
Clinical Social Work  [facility] and [facility]. [facility] and informed assessment team that patient would not admit today. CSW cancelled [facility] as well. CSW will continue to follow and will assist as needed.  M[place]olly Mertis Bush, [place] [phone]

## 2013-10-12 NOTE — Progress Notes (Signed)
Clinical Social Work  CSW met with patient at bedside. Patient laying in bed and reports today has not been a good day. Patient states that father came to the hospital uninvited and tried to get patient to sign HIPPA form that he would keep parents updated on plans. Patient refused to sign and father got upset. Patient is now XXX and prefers that family not be informed of plans. Patient reports he has not been sleeping well and still is in pain. Patient aware of inpatient recommendations and continues to agree with plans.  CSW informed patient that CSW would assist with placement and keep patient updated.  [place], [place] [phone]

## 2013-10-12 NOTE — Progress Notes (Signed)
Clinical Social Work  CSW staffed case with Interior and spatial designerdirector [facility](Hope Rife) who inquired if [facility] to [facility]. CSW spoke with [facility] Fermo(Tina) at [facility] who reports she will review referral to determine if they can accept. CSW will continue to follow.  [place]olly Tahir Blank, [place] [phone]

## 2013-10-12 NOTE — Progress Notes (Signed)
Patient observed by sitter dry heaving, with also some emesis.  Emesis seen by this nurse.  Behavioral health made aware, as well as [facility] that patient would not be discharged.  Philomena Dohenyavid Elizabet Schweppe RN

## 2013-10-13 ENCOUNTER — Inpatient Hospital Stay (HOSPITAL_COMMUNITY)
Admission: AD | Admit: 2013-10-13 | Discharge: 2013-10-20 | DRG: 885 | Disposition: A | Discharge status: Home / Self Care | Payer: Federal, State, Local not specified - Other | Source: Intra-hospital | Attending: Psychiatry | Admitting: Psychiatry

## 2013-10-13 ENCOUNTER — Encounter (HOSPITAL_COMMUNITY): Payer: Self-pay | Admitting: *Deleted

## 2013-10-13 DIAGNOSIS — F1110 Opioid abuse, uncomplicated: Secondary | ICD-10-CM | POA: Diagnosis present

## 2013-10-13 DIAGNOSIS — R45851 Suicidal ideations: Secondary | ICD-10-CM

## 2013-10-13 DIAGNOSIS — R109 Unspecified abdominal pain: Secondary | ICD-10-CM

## 2013-10-13 DIAGNOSIS — K219 Gastro-esophageal reflux disease without esophagitis: Secondary | ICD-10-CM | POA: Diagnosis present

## 2013-10-13 DIAGNOSIS — Z833 Family history of diabetes mellitus: Secondary | ICD-10-CM

## 2013-10-13 DIAGNOSIS — T50902A Poisoning by unspecified drugs, medicaments and biological substances, intentional self-harm, initial encounter: Secondary | ICD-10-CM

## 2013-10-13 DIAGNOSIS — F329 Major depressive disorder, single episode, unspecified: Secondary | ICD-10-CM | POA: Diagnosis present

## 2013-10-13 DIAGNOSIS — Z598 Other problems related to housing and economic circumstances: Secondary | ICD-10-CM

## 2013-10-13 DIAGNOSIS — Z5987 Material hardship due to limited financial resources, not elsewhere classified: Secondary | ICD-10-CM

## 2013-10-13 DIAGNOSIS — F1020 Alcohol dependence, uncomplicated: Secondary | ICD-10-CM | POA: Diagnosis present

## 2013-10-13 DIAGNOSIS — R1110 Vomiting, unspecified: Secondary | ICD-10-CM

## 2013-10-13 DIAGNOSIS — F332 Major depressive disorder, recurrent severe without psychotic features: Principal | ICD-10-CM | POA: Diagnosis present

## 2013-10-13 DIAGNOSIS — Z800 Family history of malignant neoplasm of digestive organs: Secondary | ICD-10-CM

## 2013-10-13 DIAGNOSIS — F411 Generalized anxiety disorder: Secondary | ICD-10-CM | POA: Diagnosis present

## 2013-10-13 DIAGNOSIS — K859 Acute pancreatitis without necrosis or infection, unspecified: Secondary | ICD-10-CM

## 2013-10-13 DIAGNOSIS — K861 Other chronic pancreatitis: Secondary | ICD-10-CM

## 2013-10-13 HISTORY — DX: Major depressive disorder, single episode, unspecified: F32.9

## 2013-10-13 HISTORY — DX: Depression, unspecified: F32.A

## 2013-10-13 HISTORY — DX: Anxiety disorder, unspecified: F41.9

## 2013-10-13 LAB — CBC
HCT: 33 % — ABNORMAL LOW (ref 39.0–52.0)
Hemoglobin: 12.1 g/dL — ABNORMAL LOW (ref 13.0–17.0)
MCH: 31.3 pg (ref 26.0–34.0)
MCHC: 36.7 g/dL — ABNORMAL HIGH (ref 30.0–36.0)
MCV: 85.3 fL (ref 78.0–100.0)
Platelets: 132 10*3/uL — ABNORMAL LOW (ref 150–400)
RBC: 3.87 MIL/uL — ABNORMAL LOW (ref 4.22–5.81)
RDW: 11.8 % (ref 11.5–15.5)
WBC: 4.6 10*3/uL (ref 4.0–10.5)

## 2013-10-13 LAB — BASIC METABOLIC PANEL
BUN: 3 mg/dL — ABNORMAL LOW (ref 6–23)
CO2: 23 mEq/L (ref 19–32)
Calcium: 8.1 mg/dL — ABNORMAL LOW (ref 8.4–10.5)
Chloride: 104 mEq/L (ref 96–112)
Creatinine, Ser: 0.82 mg/dL (ref 0.50–1.35)
GFR calc Af Amer: >90 mL/min (ref >90)
GFR calc non Af Amer: >90 mL/min (ref >90)
Glucose, Bld: 88 mg/dL (ref 70–99)
Potassium: 3.5 mEq/L — ABNORMAL LOW (ref 3.7–5.3)
Sodium: 139 mEq/L (ref 137–147)

## 2013-10-13 LAB — LIPASE, BLOOD: Lipase: 6 U/L — ABNORMAL LOW (ref 11–59)

## 2013-10-13 MED ORDER — PANCRELIPASE (LIP-PROT-AMYL) 12000-38000 UNITS PO CPEP
6.0000 | ORAL_CAPSULE | Freq: Two times a day (BID) | ORAL | Status: DC
  Administered 2013-10-14: 6 via ORAL
  Filled 2013-10-13 (×5): qty 6

## 2013-10-13 MED ORDER — DIVALPROEX SODIUM ER 250 MG PO TB24
250.0000 mg | ORAL_TABLET | Freq: Every day | ORAL | Status: DC
  Administered 2013-10-13: 250 mg via ORAL
  Filled 2013-10-13 (×4): qty 1

## 2013-10-13 MED ORDER — METRONIDAZOLE 500 MG PO TABS
500.0000 mg | ORAL_TABLET | Freq: Three times a day (TID) | ORAL | Status: DC
  Administered 2013-10-13 – 2013-10-14 (×3): 500 mg via ORAL
  Filled 2013-10-13 (×2): qty 1
  Filled 2013-10-13: qty 2
  Filled 2013-10-13 (×7): qty 1
  Filled 2013-10-13: qty 2
  Filled 2013-10-13 (×10): qty 1
  Filled 2013-10-13: qty 2
  Filled 2013-10-13 (×2): qty 1
  Filled 2013-10-13: qty 2
  Filled 2013-10-13 (×3): qty 1

## 2013-10-13 MED ORDER — ONDANSETRON HCL 4 MG PO TABS
4.0000 mg | ORAL_TABLET | Freq: Three times a day (TID) | ORAL | Status: DC | PRN
  Administered 2013-10-13 – 2013-10-14 (×3): 4 mg via ORAL
  Filled 2013-10-13 (×3): qty 1

## 2013-10-13 MED ORDER — CIPROFLOXACIN HCL 500 MG PO TABS
500.0000 mg | ORAL_TABLET | Freq: Two times a day (BID) | ORAL | Status: DC
  Administered 2013-10-13: 500 mg via ORAL
  Filled 2013-10-13 (×2): qty 1
  Filled 2013-10-13: qty 2
  Filled 2013-10-13 (×4): qty 1

## 2013-10-13 MED ORDER — ALUM & MAG HYDROXIDE-SIMETH 200-200-20 MG/5ML PO SUSP
30.0000 mL | ORAL | Status: DC | PRN
  Administered 2013-10-14 (×2): 30 mL via ORAL

## 2013-10-13 MED ORDER — MAGNESIUM HYDROXIDE 400 MG/5ML PO SUSP: 30.0000 mL | Freq: Every day | ORAL | Status: DC | PRN

## 2013-10-13 NOTE — Progress Notes (Signed)
Clinical Social Work  Patient was discussed during progression meeting and MD reports if patient is stable this afternoon then [facility]. [facility] who reports they held patient's bed and agreeable to accept this afternoon if stable. CSW will continue to follow.  [place] Clair[place]olly Sruti [place], [place] [phone]

## 2013-10-13 NOTE — Progress Notes (Signed)
D.  Pt. Reports nausea, no vomiting. A.  Given Zofran 4mg  PO tablet.  Pt. Went to dinner and reported that he ate a little rice.  R.  No vomiting verbalized or noted.

## 2013-10-13 NOTE — ED Provider Notes (Signed)
Medical screening examination/treatment/procedure(s) were performed by non-physician practitioner and as supervising physician I was immediately available for consultation/collaboration.   EKG Interpretation   Date/Time:  Tuesday October 10 2013 12:21:07 EDT Ventricular Rate:  84 PR Interval:  158 QRS Duration: 89 QT Interval:  361 QTC Calculation: 427 R Axis:   92 Text Interpretation:  Sinus rhythm Borderline right axis deviation ST  elevation suggests acute pericarditis Confirmed by Rubin PayorPICKERING  MD, Ramez Arrona  [phone]) on 10/10/2013 12:27:21 PM       Juliet RudeNathan R. Rubin PayorPickering, MD 10/13/13 [phone]

## 2013-10-13 NOTE — Progress Notes (Signed)
Clinical Social Work  Per MD, patient medically stable [facility]. [facility]Tina[facility] who confirms patient can be accepted today to 10503-2. Patient signed voluntary form which [facility] and original copy placed on chart. RN to call report to [phone]. CSW coordinated transportation via [employer]. CSW is signing off but available if further needs arise.  [place]olly Adger [place], [place] [phone]

## 2013-10-13 NOTE — Progress Notes (Signed)
[facility] Group Notes:  (Nursing/MHT/Case Management/Adjunct)  Date:  10/13/2013  Time:  10:56 PM  Type of Therapy:  Group Therapy  Participation Level:  Did Not Attend  Participation Quality:  Did Not Attend   Affect:  Did Not Attend  Cognitive:  Did Not Attend  Insight:  None  Engagement in Group:  Did Not Attend  Modes of Intervention:  Socialization and Support  Summary of Progress/Problems: Pt. Was sleeping in bed.  Sondra ComeRyan J Gerod Caligiuri 10/13/2013, 10:56 PM

## 2013-10-13 NOTE — Discharge Summary (Signed)
Physician Discharge Summary  [name] ZOX:[id number] DOB: 1980-07-04 DOA: 10/10/2013  PCP: Debbora Presto, MD  Admit date: 10/10/2013 Discharge date: 10/13/2013  Recommendations for Outpatient Follow-up:  1. Pt will need to follow up with PCP in 1 weeks post discharge 2. Please obtain BMP to evaluate electrolytes and kidney function 3. Please also check CBC to evaluate Hg and Hct levels  Patient is getting transferred to [facility] Discharge Diagnoses:  Principal Problem:   Chronic pancreatitis Active Problems:   Pancreatitis   Abdominal pain   Cystic fibrosis   Intractable vomiting   Suicidal ideation Chronic Pancreatitis/Abdominal pain  -Unclear if patient truly has acute exacerbation of his chronic pancreatitis  -Lipase = 6, LFTs normal  - 10/13/13 CT abdomen/pelvis does not show pancreatic inflammation  - Pain control with opiates  - advanced diet with improvement in condition. Pt able to pills - continue pancreatic enzymes with meals  - Pain level out of proportion with physical exam findings  -4/28/15Acute abdominal series negative for any acute intra-abdominal process  -On the day of discharge, WBC was 4.6, hemoglobin 12.1. Lipase was 6. LFTs were normal. -Once I told the patient that his intravenous Dilaudid and Phenergan were being discontinued, he stated that he was ready to be discharged ? Colitis/abnormal colon thickening  - CT abdomen shows thickening of the ascending colon--?underdistension  - Continue Cipro 500 mg twice a day and Flagyl 500 mg 3 times a day x4 additional days, which will complete 7 days of therapy  -doubt pt picked up Rx from pharmacy--pt very nebulous regarding which pharmacy he uses  -c.diff negative  -Abdominal ultrasound shows fatty liver without any acute findings, otherwise negative  Suicidal ideation  -Appreciate psychiatry followup  -Again doubt that the patient picked up medications from his pharmacy  -Continue  Depakote 250 mg daily  -pt lives with parents  -[facility] before [facility] to [facility] in March 2015  -Psychiatry recommended transfer to inpatient psychiatric facility  -Social work assisted with transfer to [employer] [facility]  Cystic fibrosis  - stable  -No respiratory distress  Bipolar disorder  -Patient was seen by Psych and started on Depakote 250 mg po daily   Discharge Condition: Stable  Disposition:  Follow-up Information   [facility] .  On 10/18/2013. (Wed.May 6,2015@15 :15am with Dr. Izola Price.  )    Contact information:   [address] [place] [address]      Diet:regular Wt Readings from Last 3 Encounters:  10/05/13 73.8 kg (162 lb 11.2 oz)      Consultants: psychiatry  Discharge Exam: Filed Vitals:   10/13/13 0427  BP: 132/90  Pulse: 79  Temp: 98.1 F (36.7 C)  Resp: 16   Filed Vitals:   10/12/13 0411 10/12/13 1411 10/12/13 2104 10/13/13 0427  BP: 115/64 126/82 147/99 132/90  Pulse: 71 72 83 79  Temp: 98.3 F (36.8 C) 98.2 F (36.8 C) 98.2 F (36.8 C) 98.1 F (36.7 C)  TempSrc: Oral Oral Oral Oral  Resp: 18 18 18 16   SpO2: 93% 93% 94% 94%   General: A&O x 3, NAD, pleasant, cooperative Cardiovascular: RRR, no rub, no gallop, no S3 Respiratory: CTAB, no wheeze, no rhonchi Abdomen:soft, epigastric tenderness and periumbilical tenderness to feather light touch, nondistended, positive bowel sounds; no peritoneal signs Extremities: No edema, No lymphangitis, no petechiae  Discharge Instructions      Discharge Orders   Future Orders Complete By Expires  Diet - low sodium heart healthy  As directed    Increase activity slowly  As directed    Increase activity slowly  As directed        Medication List         ciprofloxacin 500 MG tablet  Commonly known as:  CIPRO  Take 1 tablet (500 mg total) by mouth 2 (two) times daily.     divalproex 250 MG 24 hr tablet    Commonly known as:  DEPAKOTE ER  Take 1 tablet (250 mg total) by mouth daily.     lipase/protease/amylase [id number] UNITS Cpep capsule  Commonly known as:  CREON-12/PANCREASE  Take 6 capsules by mouth 2 (two) times daily.     metroNIDAZOLE 500 MG tablet  Commonly known as:  FLAGYL  Take 1 tablet (500 mg total) by mouth 3 (three) times daily.         The results of significant diagnostics from this hospitalization (including imaging, microbiology, ancillary and laboratory) are listed below for reference.    Significant Diagnostic Studies: Dg Chest 2 View  10/01/2013   CLINICAL DATA:  Cystic fibrosis  EXAM: CHEST - 2 VIEW  COMPARISON:  None.  FINDINGS: Lungs are hyper aerated. Linear opacities are seen throughout both central lung zones. Patchy densities are seen in the upper lung zones and central lower lung zones. An inflammatory process is not excluded. Normal heart size. No pneumothorax.  IMPRESSION: Hyperaeration and linear opacities consistent with cystic fibrosis.  Patchy bilateral opacities are noted and an inflammatory process cannot be excluded without a comparison with prior studies.   Electronically Signed   By: Maryclare BeanArt  Hoss M.D.   On: 10/01/2013 23:20   Dg Abd 1 View  10/01/2013   CLINICAL DATA:  Abdominal pain  EXAM: ABDOMEN - 1 VIEW  COMPARISON:  None.  FINDINGS: No disproportionate dilatation of bowel. No obvious free intraperitoneal gas. Moderate stool burden in the colon.  IMPRESSION: Nonobstructive bowel gas pattern.   Electronically Signed   By: Maryclare BeanArt  Hoss M.D.   On: 10/01/2013 23:18   [place] Abdomen Complete  10/10/2013   CLINICAL DATA:  Abdominal pain. History of pancreatitis. History of cholecystectomy.  EXAM: ULTRASOUND ABDOMEN COMPLETE  COMPARISON:  CT 10/03/2013  FINDINGS: Gallbladder:  Surgically absent  Common bile duct:  Diameter: Normal at 4 mm  Liver:  Liver is mildly increased in parenchymal echogenicity. Mild lobular contour. No duct dilatation  IVC:  No abnormality  visualized.  Pancreas:  Visualized portion unremarkable.  Spleen:  Size and appearance within normal limits.  Right Kidney:  Length: 10.1. Echogenicity within normal limits. No mass or hydronephrosis visualized.  Left Kidney:  Length: 11.9. Echogenicity within normal limits. No mass or hydronephrosis visualized.  Abdominal aorta:  No aneurysm visualized.  Other findings:  No free fluid  IMPRESSION: 1. No acute findings in the abdomen by ultrasound. 2. Mildly echogenic liver consistent with mild hepatic steatosis versus hepatocellular disease.   Electronically Signed   By: Genevive BiStewart  Edmunds M.D.   On: 10/10/2013 14:11   Ct Abdomen Pelvis W Contrast  10/12/2013   CLINICAL DATA:  Chronic pancreatitis with persistent abdominal pain and vomiting.  EXAM: CT ABDOMEN AND PELVIS WITH CONTRAST  TECHNIQUE: Multidetector CT imaging of the abdomen and pelvis was performed using the standard protocol following bolus administration of intravenous contrast.  CONTRAST:  100mL OMNIPAQUE IOHEXOL 300 MG/ML  SOLN  COMPARISON:  10/03/2013  FINDINGS: There is hepatosplenomegaly with prominent varices in the splenic hilum with  portal vein distention consistent with portal hypertension. There is hepatic steatosis. Enlargement of the caudate lobe is consistent with cirrhosis.  Gallbladder has been removed.  No dilated bile ducts.  There is diffuse pancreatic atrophy with no evidence of acute pancreatitis.  The adrenal glands and kidneys are normal. The bowel appears normal. No free air or free fluid in the abdomen. Bladder is normal. No osseous abnormality.  No change since the prior study.  IMPRESSION: 1. No acute abnormality. 2. Hepatosplenomegaly with cirrhosis and hepatic steatosis. 3. Portal hypertension. 4. No acute pancreatic abnormality.  Stomach appears normal.   Electronically Signed   By: Geanie CooleyJim  Maxwell M.D.   On: 10/12/2013 20:29   Ct Abdomen Pelvis W Contrast  10/03/2013   CLINICAL DATA:  Re-evaluation of the pancreas.  Extreme upper abdominal pain. History of pancreatitis  EXAM: CT ABDOMEN AND PELVIS WITH CONTRAST  TECHNIQUE: Multidetector CT imaging of the abdomen and pelvis was performed using the standard protocol following bolus administration of intravenous contrast.  CONTRAST:  100mL OMNIPAQUE IOHEXOL 300 MG/ML  SOLN  COMPARISON:  DG ABD 1 VIEW dated 10/01/2013; CT ABD-PELV W/ CM dated 09/25/2013  FINDINGS: Vague nodular infiltration in the lung bases is similar to previous study. Surgical absence of the gallbladder. Fatty atrophy of the pancreas. No developing pancreatic ductal dilatation, bile duct dilatation, peripancreatic infiltration, or peripancreatic fluid collection. Mild fatty infiltration of the liver. Prominent spleen size.  The adrenal glands, kidneys, abdominal aorta, inferior vena cava, and retroperitoneal lymph nodes are unremarkable. The stomach, small bowel, and colon are not abnormally distended. There is suggestion of wall thickening in the ascending colon. This could be due to 100 distention but focal colitis is not excluded. No free air or free fluid in the abdomen.  Pelvis: Appendix is normal. No diverticulitis. Bladder wall is not thickened. No mass or lymphadenopathy in the pelvis. No destructive bone lesions.  IMPRESSION: No developing inflammatory changes around the pancreas. Diffuse fatty replacement of the pancreas. Possible wall thickening in the ascending colon may be due to under distention but focal colitis is not excluded. Examination is otherwise unchanged since previous study.   Electronically Signed   By: Burman NievesWilliam  Stevens M.D.   On: 10/03/2013 23:46   Dg Abd Acute W/chest  10/10/2013   CLINICAL DATA:  Abdominal pain, nausea/vomiting/diarrhea, history of pancreatitis an cystic fibrosis.  EXAM: ACUTE ABDOMEN SERIES (ABDOMEN 2 VIEW & CHEST 1 VIEW)  COMPARISON:  CT abdomen pelvis dated 10/03/2013. Chest radiographs dated 10/01/2013.  FINDINGS: Chronic interstitial markings with right upper  lobe predominant fibrosis, compatible with known cystic fibrosis. No pleural effusion or pneumothorax.  The heart is normal in size.  Nonobstructive bowel gas pattern.  No evidence of free air under the diaphragm on the upright view.  Cholecystectomy clips.  Visualized osseous structures are within normal limits.  IMPRESSION: Stable chronic interstitial markings/fibrosis related to known cystic fibrosis.  No evidence of small bowel obstruction or free air.   Electronically Signed   By: Charline BillsSriyesh  Krishnan M.D.   On: 10/10/2013 13:40     Microbiology: Recent Results (from the past 240 hour(s))  CLOSTRIDIUM DIFFICILE BY PCR     Status: None   Collection Time    10/10/13  8:31 PM      Result Value Ref Range Status   C difficile by pcr NEGATIVE  NEGATIVE Final   Comment: [facility]     Labs: Basic Metabolic Panel:  Recent Labs Lab 10/10/13 1200  10/11/13 0420 10/12/13 0336 10/13/13 0418  NA 140 141 141 139  K 4.1 3.7 3.4* 3.5*  CL 103 109 105 104  CO2 25 23 22 23   GLUCOSE 147* 102* 91 88  BUN 9 6 4* 3*  CREATININE 0.97 0.83 0.86 0.82  CALCIUM 9.2 8.0* 8.3* 8.1*   Liver Function Tests:  Recent Labs Lab 10/10/13 1200 10/11/13 0420 10/12/13 0336  AST 28 24 31   ALT 21 17 22   ALKPHOS 108 78 84  BILITOT 0.8 0.5 0.7  PROT 7.0 5.2* 5.6*  ALBUMIN 4.1 3.0* 3.2*    Recent Labs Lab 10/10/13 1200 10/10/13 1401 10/13/13 0418  LIPASE 7*  --  6*  AMYLASE  --  38  --    No results found for this basename: AMMONIA,  in the last 168 hours CBC:  Recent Labs Lab 10/10/13 1200 10/11/13 0420 10/12/13 0336 10/13/13 0418  WBC 7.5 5.0 7.6 4.6  NEUTROABS 5.2  --  5.3  --   HGB 15.1 12.2* 12.7* 12.1*  HCT 42.7 34.7* 35.6* 33.0*  MCV 88.6 89.9 88.1 85.3  PLT 171 120* 127* 132*   Cardiac Enzymes:  Recent Labs Lab 10/10/13 1200  TROPONINI <0.30   BNP: No components found with this basename: POCBNP,  CBG: No results found for this basename: GLUCAP,  in  the last 168 hours  Time coordinating discharge:  Greater than 30 minutes  Signed:  Catarina Hartshorn, DO [facility] Pager: [phone] 10/13/2013, 1:35 PM

## 2013-10-13 NOTE — Progress Notes (Signed)
33 year old male pt admitted on voluntary basis and transferred from medical floor. Pt came to [facility] after ingesting "15 roxicodone" in a suicide attempt. Pt endorsed that it was a suicide attempt, does have passive SI on admission, able to contract for safety on the unit. Pt reports that he currently lives with parents but does not want to go there after discharge and is unsure of where he will go. Pt reports abdominal pain on admission and reports that it became worse after lunch, however pt also stated that he only had clear liquids for lunch. Pt is requesting pain medication and medication for nausea. Pt was oriented to unit and safety maintained.

## 2013-10-13 NOTE — Progress Notes (Signed)
Pt. Given a snack of vanilla  pudding and peanut butter and crackers.

## 2013-10-13 NOTE — Progress Notes (Signed)
[facility] [place] [facility].Discharge instructions accompanied pt, [facility].

## 2013-10-14 ENCOUNTER — Encounter (HOSPITAL_COMMUNITY): Payer: Self-pay | Admitting: Psychiatry

## 2013-10-14 DIAGNOSIS — T50901A Poisoning by unspecified drugs, medicaments and biological substances, accidental (unintentional), initial encounter: Secondary | ICD-10-CM

## 2013-10-14 DIAGNOSIS — F332 Major depressive disorder, recurrent severe without psychotic features: Principal | ICD-10-CM | POA: Diagnosis present

## 2013-10-14 DIAGNOSIS — T50902A Poisoning by unspecified drugs, medicaments and biological substances, intentional self-harm, initial encounter: Secondary | ICD-10-CM

## 2013-10-14 LAB — CBC WITH DIFFERENTIAL/PLATELET
BASOS PCT: 0 % (ref 0–1)
Basophils Absolute: 0 10*3/uL (ref 0.0–0.1)
EOS ABS: 0 10*3/uL (ref 0.0–0.7)
Eosinophils Relative: 1 % (ref 0–5)
HCT: 37 % — ABNORMAL LOW (ref 39.0–52.0)
Hemoglobin: 13.5 g/dL (ref 13.0–17.0)
LYMPHS ABS: 1.1 10*3/uL (ref 0.7–4.0)
Lymphocytes Relative: 16 % (ref 12–46)
MCH: 31.4 pg (ref 26.0–34.0)
MCHC: 36.5 g/dL — AB (ref 30.0–36.0)
MCV: 86 fL (ref 78.0–100.0)
Monocytes Absolute: 0.4 10*3/uL (ref 0.1–1.0)
Monocytes Relative: 5 % (ref 3–12)
NEUTROS PCT: 78 % — AB (ref 43–77)
Neutro Abs: 5.1 10*3/uL (ref 1.7–7.7)
Platelets: 165 10*3/uL (ref 150–400)
RBC: 4.3 MIL/uL (ref 4.22–5.81)
RDW: 11.8 % (ref 11.5–15.5)
WBC: 6.6 10*3/uL (ref 4.0–10.5)

## 2013-10-14 LAB — COMPREHENSIVE METABOLIC PANEL
ALT: 20 U/L (ref 0–53)
AST: 23 U/L (ref 0–37)
Albumin: 3.6 g/dL (ref 3.5–5.2)
Alkaline Phosphatase: 80 U/L (ref 39–117)
BILIRUBIN TOTAL: 0.3 mg/dL (ref 0.3–1.2)
BUN: 4 mg/dL — AB (ref 6–23)
CHLORIDE: 103 meq/L (ref 96–112)
CO2: 28 meq/L (ref 19–32)
CREATININE: 0.94 mg/dL (ref 0.50–1.35)
Calcium: 8.7 mg/dL (ref 8.4–10.5)
GFR calc Af Amer: >90 mL/min (ref >90)
GFR calc non Af Amer: >90 mL/min (ref >90)
Glucose, Bld: 196 mg/dL — ABNORMAL HIGH (ref 70–99)
Potassium: 3.7 mEq/L (ref 3.7–5.3)
Sodium: 142 mEq/L (ref 137–147)
Total Protein: 6.1 g/dL (ref 6.0–8.3)

## 2013-10-14 MED ORDER — NAPROXEN 500 MG PO TABS
500.0000 mg | ORAL_TABLET | Freq: Once | ORAL | Status: AC
  Administered 2013-10-14: 500 mg via ORAL
  Filled 2013-10-14: qty 1

## 2013-10-14 MED ORDER — NAPROXEN 500 MG PO TABS
500.0000 mg | ORAL_TABLET | Freq: Three times a day (TID) | ORAL | Status: DC
  Administered 2013-10-14 – 2013-10-18 (×14): 500 mg via ORAL
  Filled 2013-10-14 (×20): qty 1

## 2013-10-14 MED ORDER — GABAPENTIN 300 MG PO CAPS
300.0000 mg | ORAL_CAPSULE | Freq: Once | ORAL | Status: AC
  Administered 2013-10-14: 300 mg via ORAL
  Filled 2013-10-14: qty 1

## 2013-10-14 MED ORDER — DIVALPROEX SODIUM ER 500 MG PO TB24: 500.0000 mg | ORAL_TABLET | Freq: Every day | ORAL | Status: DC

## 2013-10-14 MED ORDER — GABAPENTIN 300 MG PO CAPS
300.0000 mg | ORAL_CAPSULE | Freq: Three times a day (TID) | ORAL | Status: DC
  Administered 2013-10-14 – 2013-10-16 (×7): 300 mg via ORAL
  Filled 2013-10-14 (×10): qty 1

## 2013-10-14 MED ORDER — MIRTAZAPINE 15 MG PO TABS
15.0000 mg | ORAL_TABLET | Freq: Every day | ORAL | Status: DC
  Administered 2013-10-14 – 2013-10-19 (×6): 15 mg via ORAL
  Filled 2013-10-14 (×2): qty 1
  Filled 2013-10-14 (×2): qty 14
  Filled 2013-10-14 (×5): qty 1

## 2013-10-14 MED ORDER — PANCRELIPASE (LIP-PROT-AMYL) 12000-38000 UNITS PO CPEP
6.0000 | ORAL_CAPSULE | Freq: Three times a day (TID) | ORAL | Status: DC
  Administered 2013-10-14 – 2013-10-20 (×17): 6 via ORAL
  Filled 2013-10-14 (×23): qty 6

## 2013-10-14 NOTE — Progress Notes (Signed)
[name] is seen out in the hall on [address]....by himself, he has slept off and on in his room today. He takes medicine, then returns to his bed, sleeps and then gets up to take more medicine.   A HE is easily irritable, agitated and he frequently argues with this nurse about why he does not get to dictate what medicines he receives.He says " it's awful..idiopathic thrombocytopenic purpura's just awful...". All workup  Done previously in [facility] was nonconclusive  And all labwork available is mostly WNL. Pt adamant that his abdominal pain is " killing him" .   R  Pt's orders reviewed with pt . Safety in place. He refuses to answer assessment questions..[name]
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
## 2013-10-14 NOTE — [facility] Suicide Risk Assessment (Signed)
   Nursing information obtained from:    Demographic factors:    Current Mental Status:    Loss Factors:    Historical Factors:    Risk Reduction Factors:    Total Time spent with patient: 30 minutes  CLINICAL FACTORS:   Severe Anxiety and/or Agitation Depression:   Aggression Anhedonia Comorbid alcohol abuse/dependence Hopelessness Impulsivity Insomnia Severe Alcohol/Substance Abuse/Dependencies Chronic Pain Unstable or Poor Therapeutic Relationship  Psychiatric Specialty Exam: Physical Exam  Psychiatric: His speech is normal. His mood appears anxious. He is agitated and aggressive. Cognition and memory are normal. He expresses impulsivity. He exhibits a depressed mood. He expresses suicidal ideation. He expresses suicidal plans.    Review of Systems  Constitutional: Negative.   HENT: Negative.   Eyes: Negative.   Respiratory: Negative.   Cardiovascular: Negative.   Gastrointestinal: Negative.   Genitourinary: Negative.   Musculoskeletal: Positive for myalgias.  Skin: Negative.   Neurological: Negative.   Endo/Heme/Allergies: Negative.   Psychiatric/Behavioral: Positive for depression, suicidal ideas and substance abuse. The patient is nervous/anxious and has insomnia.     Blood pressure 123/88, pulse 79, temperature 97.8 F (36.6 C), temperature source Oral, resp. rate 16, height 6' (1.829 m), weight 71.668 kg (158 lb).Body mass index is 21.42 kg/(m^2).  General Appearance: Disheveled  Eye Contact::  Minimal  Speech:  Normal Rate  Volume:  Increased  Mood:  Anxious, Depressed and Irritable  Affect:  Labile and Full Range  Thought Process:  Goal Directed  Orientation:  Full (Time, Place, and Person)  Thought Content:  Negative  Suicidal Thoughts:  Yes.  without intent/plan  Homicidal Thoughts:  No  Memory:  Immediate;   Fair Recent;   Fair Remote;   Fair  Judgement:  Poor  Insight:  Lacking  Psychomotor Activity:  Increased  Concentration:  Poor  Recall:   [employer]ervFair  Fund of Knowledge:Fair  Language: Fair  Akathisia:  No  Handed:    AIMS (if indicated):     Assets:  Communication Skills  Sleep:  Number of Hours: 4   Musculoskeletal: Strength & Muscle Tone: within normal limits Gait & Station: normal Patient leans: N/A  COGNITIVE FEATURES THAT CONTRIBUTE TO RISK:  Closed-mindedness Polarized thinking    SUICIDE RISK:   Mild:  Suicidal ideation of limited frequency, intensity, duration, and specificity.  There are no identifiable plans, no associated intent, mild dysphoria and related symptoms, good self-control (both objective and subjective assessment), few other risk factors, and identifiable protective factors, including available and accessible social support.  PLAN OF CARE:1. Admit for crisis management and stabilization. 2. Medication management to reduce current symptoms to base line and improve the  patient's overall level of functioning 3. Treat health problems as indicated. 4. Develop treatment plan to decrease risk of relapse upon discharge and the need for readmission. 5. Psycho-social education regarding relapse prevention and self care. 6. Health care follow up as needed for medical problems. 7. Restart home medications where appropriate.   I certify that inpatient services furnished can reasonably be expected to improve the patient's condition.  Thedore MinsMojeed Trachelle Low, MD 10/14/2013, 11:41 AM

## 2013-10-14 NOTE — Progress Notes (Signed)
Pt did not attend group this afternoon.   

## 2013-10-14 NOTE — Progress Notes (Signed)
Adult Psychoeducational Group Note  Date:  10/14/2013 Time:  11:11 PM  Group Topic/Focus:  Wrap-Up Group:   The focus of this group is to help patients review their daily goal of treatment and discuss progress on daily workbooks.  Participation Level:  Active  Participation Quality:  Inattentive and Resistant  Affect:  Flat  Cognitive:  Oriented  Insight: Lacking  Engagement in Group:  None  Modes of Intervention:  Discussion  Additional Comments:  Pt was present for part of wrap up group. He was in the hall speaking with a nurse for the beginning of group. He was participating more with the other patients today than this writer witnessed yesterday. His affect is still flat and sad. He reported that he has neither learned anything nor has anything good happened during his stay. He is not willing to set goals. After group, he was able to open up a little more while the patients and I were talking about movies in the day room.   Jla Reynolds A Merlen Gurry 10/14/2013, 11:11 PM

## 2013-10-14 NOTE — [facility] Group Notes (Signed)
[facility] LCSW Group Therapy  10/14/2013 4:38 PM  Type of Therapy:  Group Therapy  Participation Level:  Minimal  Participation Quality:  Supportive  Affect:  Depressed and Flat  Cognitive:  Alert and Oriented  Insight:  Limited  Engagement in Therapy:  Supportive  Modes of Intervention:  Discussion, Education, Exploration, Rapport Building and Support  Summary of Progress/Problems: Pt came to group and was supportive of others sharing.  Pt did not express any insight into his own supports or coping skills during group.  Jeffrey SpatesRachel Anne Shaunna Bush 10/14/2013, 4:38 PM
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
## 2013-10-14 NOTE — Progress Notes (Signed)
Patient ID: [name], male   DOB: 05/18/1981, 33 y.o.   MRN: [id number] Pt resting in bed with eyes closed.  RR equal and unlabored.  Fifteen minute checks in progress for patient safety.  Pt safe on unit.

## 2013-10-14 NOTE — [facility] Group Notes (Signed)
[facility] Group Notes:  Goals Group  Date:  10/14/2013  Time:  10:07 AM  Type of Therapy:  goals group  Participation Level:  Did Not Attend  Participation Quality:  Inattentive  Affect:  Flat  Cognitive:  Lacking  Insight:  None  Engagement in Group:  None  Modes of Intervention:  Discussion  Summary of Progress/Problems:pt did not attend group,He stated he wanted to see the doctor about his stomach pain  Nicole CellaJanet Guyes Frenchie Dangerfield 10/14/2013, 10:07 AM

## 2013-10-15 DIAGNOSIS — T50902A Poisoning by unspecified drugs, medicaments and biological substances, intentional self-harm, initial encounter: Secondary | ICD-10-CM | POA: Diagnosis present

## 2013-10-15 MED ORDER — BUSPIRONE HCL 15 MG PO TABS
7.5000 mg | ORAL_TABLET | Freq: Three times a day (TID) | ORAL | Status: DC
  Administered 2013-10-15 – 2013-10-16 (×3): 7.5 mg via ORAL
  Filled 2013-10-15 (×7): qty 1

## 2013-10-15 MED ORDER — ZIPRASIDONE MESYLATE 20 MG IM SOLR
20.0000 mg | Freq: Once | INTRAMUSCULAR | Status: AC
  Administered 2013-10-15: 20 mg via INTRAMUSCULAR

## 2013-10-15 MED ORDER — DIPHENHYDRAMINE HCL 50 MG/ML IJ SOLN
INTRAMUSCULAR | Status: AC
  Administered 2013-10-15: 50 mg via INTRAMUSCULAR
  Filled 2013-10-15: qty 1

## 2013-10-15 MED ORDER — DICYCLOMINE HCL 20 MG PO TABS
20.0000 mg | ORAL_TABLET | Freq: Four times a day (QID) | ORAL | Status: DC | PRN
  Administered 2013-10-16 – 2013-10-17 (×2): 20 mg via ORAL
  Filled 2013-10-15 (×2): qty 1

## 2013-10-15 MED ORDER — PANTOPRAZOLE SODIUM 40 MG PO TBEC
40.0000 mg | DELAYED_RELEASE_TABLET | Freq: Every day | ORAL | Status: DC
  Administered 2013-10-15 – 2013-10-20 (×6): 40 mg via ORAL
  Filled 2013-10-15 (×10): qty 1

## 2013-10-15 MED ORDER — SUCRALFATE 1 G PO TABS
1.0000 g | ORAL_TABLET | Freq: Three times a day (TID) | ORAL | Status: DC
  Administered 2013-10-15 – 2013-10-20 (×20): 1 g via ORAL
  Filled 2013-10-15 (×30): qty 1

## 2013-10-15 MED ORDER — CLONIDINE HCL 0.1 MG PO TABS
0.1000 mg | ORAL_TABLET | Freq: Four times a day (QID) | ORAL | Status: AC
  Administered 2013-10-15 – 2013-10-17 (×9): 0.1 mg via ORAL
  Filled 2013-10-15 (×13): qty 1

## 2013-10-15 MED ORDER — ZIPRASIDONE MESYLATE 20 MG IM SOLR
20.0000 mg | Freq: Once | INTRAMUSCULAR | Status: AC
  Administered 2013-10-15: 20 mg via INTRAMUSCULAR
  Filled 2013-10-15: qty 20

## 2013-10-15 MED ORDER — DIPHENHYDRAMINE HCL 50 MG/ML IJ SOLN
50.0000 mg | Freq: Once | INTRAMUSCULAR | Status: AC
  Administered 2013-10-15: 50 mg via INTRAMUSCULAR

## 2013-10-15 MED ORDER — ZIPRASIDONE MESYLATE 20 MG IM SOLR
INTRAMUSCULAR | Status: AC
  Administered 2013-10-15: 20 mg via INTRAMUSCULAR
  Filled 2013-10-15: qty 20

## 2013-10-15 MED ORDER — ZIPRASIDONE MESYLATE 20 MG IM SOLR
INTRAMUSCULAR | Status: AC
  Filled 2013-10-15: qty 20

## 2013-10-15 MED ORDER — CLONIDINE HCL 0.1 MG PO TABS
0.1000 mg | ORAL_TABLET | Freq: Every day | ORAL | Status: DC
  Administered 2013-10-20: 0.1 mg via ORAL
  Filled 2013-10-15 (×2): qty 1

## 2013-10-15 MED ORDER — PROMETHAZINE HCL 25 MG PO TABS: 25.0000 mg | ORAL_TABLET | Freq: Once | ORAL | Status: DC

## 2013-10-15 MED ORDER — PROMETHAZINE HCL 25 MG PO TABS
ORAL_TABLET | ORAL | Status: AC
  Filled 2013-10-15: qty 1

## 2013-10-15 MED ORDER — CLONIDINE HCL 0.1 MG PO TABS
0.1000 mg | ORAL_TABLET | ORAL | Status: AC
  Administered 2013-10-18 – 2013-10-19 (×4): 0.1 mg via ORAL
  Filled 2013-10-15 (×4): qty 1

## 2013-10-15 MED ORDER — HYDROXYZINE HCL 25 MG PO TABS
25.0000 mg | ORAL_TABLET | Freq: Four times a day (QID) | ORAL | Status: DC | PRN
  Administered 2013-10-16 – 2013-10-19 (×5): 25 mg via ORAL
  Filled 2013-10-15 (×4): qty 1
  Filled 2013-10-15: qty 56
  Filled 2013-10-15: qty 1

## 2013-10-15 MED ORDER — CLONIDINE HCL 0.1 MG PO TABS
ORAL_TABLET | ORAL | Status: AC
  Administered 2013-10-15: 20:00:00
  Filled 2013-10-15: qty 1

## 2013-10-15 MED ORDER — ARIPIPRAZOLE 5 MG PO TABS
5.0000 mg | ORAL_TABLET | Freq: Once | ORAL | Status: AC
  Administered 2013-10-15: 5 mg via ORAL
  Filled 2013-10-15 (×2): qty 1

## 2013-10-15 MED ORDER — PROMETHAZINE HCL 25 MG PO TABS
25.0000 mg | ORAL_TABLET | Freq: Once | ORAL | Status: AC
  Administered 2013-10-15: 25 mg via ORAL
  Filled 2013-10-15: qty 1

## 2013-10-15 MED ORDER — DIPHENHYDRAMINE HCL 50 MG/ML IJ SOLN
50.0000 mg | Freq: Once | INTRAMUSCULAR | Status: DC
  Filled 2013-10-15: qty 1

## 2013-10-15 MED ORDER — CIPROFLOXACIN HCL 500 MG PO TABS
500.0000 mg | ORAL_TABLET | ORAL | Status: DC
  Filled 2013-10-15 (×12): qty 1

## 2013-10-15 MED ORDER — LOPERAMIDE HCL 2 MG PO CAPS: 2.0000 mg | ORAL_CAPSULE | ORAL | Status: DC | PRN

## 2013-10-15 MED ORDER — QUETIAPINE FUMARATE 100 MG PO TABS
100.0000 mg | ORAL_TABLET | Freq: Every day | ORAL | Status: DC
  Administered 2013-10-15: 100 mg via ORAL
  Filled 2013-10-15 (×3): qty 1

## 2013-10-15 MED ORDER — QUETIAPINE FUMARATE 50 MG PO TABS
50.0000 mg | ORAL_TABLET | Freq: Two times a day (BID) | ORAL | Status: DC
  Administered 2013-10-15 – 2013-10-16 (×2): 50 mg via ORAL
  Filled 2013-10-15 (×5): qty 1

## 2013-10-15 MED ORDER — METHOCARBAMOL 500 MG PO TABS
500.0000 mg | ORAL_TABLET | Freq: Three times a day (TID) | ORAL | Status: DC | PRN
  Administered 2013-10-16 – 2013-10-20 (×9): 500 mg via ORAL
  Filled 2013-10-15 (×9): qty 1

## 2013-10-15 MED ORDER — NAPROXEN 500 MG PO TABS
500.0000 mg | ORAL_TABLET | Freq: Two times a day (BID) | ORAL | Status: DC | PRN
  Administered 2013-10-15 – 2013-10-19 (×2): 500 mg via ORAL
  Filled 2013-10-15: qty 1

## 2013-10-15 MED ORDER — ONDANSETRON 4 MG PO TBDP
4.0000 mg | ORAL_TABLET | Freq: Four times a day (QID) | ORAL | Status: DC | PRN
  Administered 2013-10-16: 4 mg via ORAL
  Filled 2013-10-15: qty 1

## 2013-10-15 NOTE — Progress Notes (Signed)
Writer spoke with patient 1:1 before group and he reported not being too happy because he said he spoke with the NP today and was under the impression that his medication was going to be changed and prozac started. He refused his cipro and flaygl reporting that if his scans came back normal he didn't know why he was taking these.Write encouraged him to find out from his doctor why he is taking these.  He reports that one of them upsets his stomach. He reports passive si, denies hi/a/v hallucinations. Safety maintained on unit with 15 min checks.

## 2013-10-15 NOTE — [facility] Group Notes (Signed)
[facility] Group Notes: (Clinical Social Work)   10/15/2013      Type of Therapy:  Group Therapy   Participation Level:  Did Not Attend    Ambrose MantleMareida Grossman-Orr, LCSW 10/15/2013, 2:57 PM
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
## 2013-10-15 NOTE — Progress Notes (Signed)
NUTRITION ASSESSMENT  Pt identified as at risk on the Malnutrition Screen Tool  INTERVENTION: 1. Educated patient on the importance of nutrition and encouraged intake of food and beverages. 2. Discussed weight goals. 3. Supplements: None at this time  NUTRITION DIAGNOSIS: Unintentional weight loss related to sub-optimal intake as evidenced by pt report.   Goal: Pt to meet >/= 90% of their estimated nutrition needs.  Monitor:  PO intake  Assessment:  Patient with cystic fibrosis, pancreatitis, cirrhosis, and hepatic steatosis, admitted with major depressive disorder and suicide attempt. He complains of abdominal pain. Current intake is fair. However, patient not receptive to nutrition interventions. Per chart review, he has lost 4 pounds in the last week (2% of his UBW).   33 y.o. male  Height: Ht Readings from Last 1 Encounters:  10/13/13 6' (1.829 m)    Weight: Wt Readings from Last 1 Encounters:  10/13/13 158 lb (71.668 kg)    Weight Hx: Wt Readings from Last 10 Encounters:  10/13/13 158 lb (71.668 kg)  10/05/13 162 lb 11.2 oz (73.8 kg)    BMI:  Body mass index is 21.42 kg/(m^2). Pt meets criteria for normal weight based on current BMI.  Estimated Nutritional Needs: Kcal: 25-30 kcal/kg Protein: > 1 gram protein/kg Fluid: 1 ml/kcal  Diet Order: General Pt is also offered choice of unit snacks mid-morning and mid-afternoon.  Pt is eating as desired.   Lab results and medications reviewed.   Linnell FullingElyse Ajahnae Rathgeber, RD, LDN Pager #: [phone] After-Hours Pager #: [phone]

## 2013-10-15 NOTE — Progress Notes (Signed)
D: pt stated he was irritable and agitated earlier in the evening due to his pain and withdrawals. Pt stated he has passive si but contracts for safety. Rates pain 6/10 in abdomen. Pt is irritable, but cooperative. Seen engaging appropriately with others. denies hi/avh A: 1:1 time given . Naproxen given for pain. scheduled medications given. q 15 min safety checks R: pt remains safe on unit. No signs of distress noted

## 2013-10-15 NOTE — Progress Notes (Signed)
[name] has spent the majority of his day, laying in his bed, resting off and on, lying in a fetal position. He maintains a rigid, preoccupied affect. HE avoids eye contact.    A He reports phenergan helped with his nausea and  He  Takes the medicines PA prescribes for him today ( carafate, abilify, protonix, and buspar.).    R Safety is in place and poc cont.No further agitation and ./ or physical outbursts observed.
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
## 2013-10-15 NOTE — Progress Notes (Addendum)
Psychoeducational Group Note  Date: 10/15/2013 Time:  0930 Group Topic/Focus:  Gratefulness:  The focus of this group is to help patients identify what two things they are most grateful for in their lives. What helps ground them and to center them on their work to their recovery.  Participation Level:  Did not attend Payten Hobin A  

## 2013-10-15 NOTE — Progress Notes (Signed)
Psychoeducational Group Note  Date:  10/15/2013 Time:  1015  Group Topic/Focus:  Making Healthy Choices:   The focus of this group is to help patients identify negative/unhealthy choices they were using prior to admission and identify positive/healthier coping strategies to replace them upon discharge.  Participation Level:  Did not attend [name] 10/15/2013  

## 2013-10-15 NOTE — Progress Notes (Signed)
[facility] Group Notes:  (Nursing/MHT/Case Management/Adjunct)  Date:  10/15/2013  Time:  5:13 PM  Type of Therapy:  Therapeutic Activity  Participation Level:  Active  Participation Quality:  Appropriate, Attentive and Supportive  Affect:  Appropriate  Cognitive:  Appropriate  Insight:  Appropriate  Engagement in Group:  Engaged and Supportive  Modes of Intervention:  Activity  Summary of Progress/Problems:  [name] C Latana Colin 10/15/2013, 5:13 PM 

## 2013-10-15 NOTE — Progress Notes (Signed)
[facility] MD Progress Note  10/15/2013 2:17 PM [name]  MRN:  [id number] Subjective: Met with [name] to discuss his progress and response to treatment.  He became agitated and angry this morning punching the plastic case on the thermometer. He accepted a Geodon injection to help him calm down. He slept most of the morning and missed the morning groups. I spent a great deal of time 45 minutes in discussion with him regarding his depression and the events leading to his two suicide attempts.     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 He stopped following up with his own health care and "lung cleansing" even though he knew he had to do so.      His next job was good and he stayed with it for several years until he met his fiance'. She got a job offer in [place] and wanted to move there. He was unable to get a transfer from his current job to [place], and was asked to resign. He burnt his bridges there by telling his supervisor to "F&%K off!"        After he and his fiance' moved to [place] he would spend his days drinking beer, often sneaking out before she would wake to go buy beer to hide in the house. He did this for a year until she told him to move out.        He returned home and got a job quickly, but quit after two months.  He reports he gave up his first job because he "hated it, dreaded going to work each day." "I'm not going to live my life that way, I don't have to...."        He rationalized that he needed more time to look for another job, but states that he spent more time drinking.  He had not heard from his fiance' even after he became suicidal in March. 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 Bipolar  disorder Total Time spent with patient: 45 minutes  Axis I: Bipolar, Depressed  ADL's:  Intact  Sleep: Poor  Appetite:  Poor  Suicidal Ideation:  + SI but can and does contract for safety Homicidal Ideation:  declined AEB (as evidenced by):  Psychiatric Specialty Exam: Physical Exam  ROS  Blood pressure 138/88, pulse 84, temperature 97.7 F (36.5 C), temperature source Oral, resp. rate 16, height 6' (1.829 m), weight 71.668 kg (158 lb).Body mass index is 21.42 kg/(m^2).  General Appearance: Disheveled  Eye Sport and exercise psychologist::  Fair  Speech:  Clear and Coherent  Volume:  Normal  Mood:  Dysphoric and Irritable  Affect:  Labile  Thought Process:  Goal Directed  Orientation:  Full (Time, Place, and Person)  Thought Content:  entitled, grandiose,  Suicidal Thoughts:  Yes.  without intent/plan  Homicidal Thoughts:  No  Memory:  NA  Judgement:  Impaired  Insight:  Present  Psychomotor Activity:  Restlessness  Concentration:  Fair  Recall:  [place] of Knowledge:Good  Language: Good  Akathisia:  No  Handed:  Right  AIMS (if indicated):     Assets:  Communication Skills Desire for Improvement Housing  Sleep:  Number of Hours: 4   Musculoskeletal: Strength & Muscle Tone: within normal limits  Gait & Station: normal Patient leans: N/A  Current Medications: Current Facility-Administered Medications  Medication Dose Route Frequency Provider Last Rate Last Dose  . alum & mag hydroxide-simeth (MAALOX/MYLANTA) 200-200-20 MG/5ML suspension 30 mL  30 mL Oral Q4H PRN Waylan Boga, NP   30 mL at 10/14/13 1653  . ciprofloxacin (CIPRO) tablet 500 mg  500 mg Oral BID Waylan Boga, NP   500 mg at 10/13/13 2007  . gabapentin (NEURONTIN) capsule 300 mg  300 mg Oral TID Sharniece Gibbon   300 mg at 10/15/13 1322  . lipase/protease/amylase (CREON-12/PANCREASE) capsule 6 capsule  6 capsule Oral TID WC Nena Polio, PA-C   6 capsule at 10/15/13 1322  . magnesium hydroxide (MILK OF MAGNESIA)  suspension 30 mL  30 mL Oral Daily PRN Waylan Boga, NP      . metroNIDAZOLE (FLAGYL) tablet 500 mg  500 mg Oral 3 times per day Waylan Boga, NP   500 mg at 10/14/13 4158  . mirtazapine (REMERON) tablet 15 mg  15 mg Oral QHS Kiyan Burmester   15 mg at 10/14/13 2156  . naproxen (NAPROSYN) tablet 500 mg  500 mg Oral TID WC Param Capri   500 mg at 10/15/13 3094  . ondansetron (ZOFRAN) tablet 4 mg  4 mg Oral Q8H PRN Nena Polio, PA-C   4 mg at 10/14/13 1818  . promethazine (PHENERGAN) 25 MG tablet             Lab Results:  Results for orders placed during the hospital encounter of 10/13/13 (from the past 48 hour(s))  COMPREHENSIVE METABOLIC PANEL     Status: Abnormal   Collection Time    10/14/13  7:35 PM      Result Value Ref Range   Sodium 142  137 - 147 mEq/L   Potassium 3.7  3.7 - 5.3 mEq/L   Chloride 103  96 - 112 mEq/L   CO2 28  19 - 32 mEq/L   Glucose, Bld 196 (*) 70 - 99 mg/dL   BUN 4 (*) 6 - 23 mg/dL   Creatinine, Ser 0.94  0.50 - 1.35 mg/dL   Calcium 8.7  8.4 - 10.5 mg/dL   Total Protein 6.1  6.0 - 8.3 g/dL   Albumin 3.6  3.5 - 5.2 g/dL   AST 23  0 - 37 U/L   ALT 20  0 - 53 U/L   Alkaline Phosphatase 80  39 - 117 U/L   Total Bilirubin 0.3  0.3 - 1.2 mg/dL   GFR calc non Af Amer >90  >90 mL/min   GFR calc Af Amer >90  >90 mL/min   Comment: (NOTE)     The eGFR has been calculated using the CKD EPI equation.     This calculation has not been validated in all clinical situations.     eGFR's persistently <90 mL/min signify possible Chronic Kidney     Disease.     Performed at [facility]  CBC WITH DIFFERENTIAL     Status: Abnormal   Collection Time    10/14/13  7:35 PM      Result Value Ref Range   WBC 6.6  4.0 - 10.5 K/uL   RBC 4.30  4.22 - 5.81 MIL/uL   Hemoglobin 13.5  13.0 - 17.0 g/dL   HCT 37.0 (*) 39.0 - 52.0 %   MCV 86.0  78.0 - 100.0 fL   MCH 31.4  26.0 - 34.0 pg   MCHC 36.5 (*) 30.0 -  36.0 g/dL   RDW 11.8  11.5 - 15.5 %   Platelets  165  150 - 400 K/uL   Neutrophils Relative % 78 (*) 43 - 77 %   Neutro Abs 5.1  1.7 - 7.7 K/uL   Lymphocytes Relative 16  12 - 46 %   Lymphs Abs 1.1  0.7 - 4.0 K/uL   Monocytes Relative 5  3 - 12 %   Monocytes Absolute 0.4  0.1 - 1.0 K/uL   Eosinophils Relative 1  0 - 5 %   Eosinophils Absolute 0.0  0.0 - 0.7 K/uL   Basophils Relative 0  0 - 1 %   Basophils Absolute 0.0  0.0 - 0.1 K/uL   Comment: Performed at [facility]    Physical Findings: AIMS: Facial and Oral Movements Muscles of Facial Expression: None, normal Lips and Perioral Area: None, normal Jaw: None, normal Tongue: None, normal,Extremity Movements Upper (arms, wrists, hands, fingers): None, normal Lower (legs, knees, ankles, toes): None, normal, Trunk Movements Neck, shoulders, hips: None, normal, Overall Severity Severity of abnormal movements (highest score from questions above): None, normal Incapacitation due to abnormal movements: None, normal Patient's awareness of abnormal movements (rate only patient's report): No Awareness, Dental Status Current problems with teeth and/or dentures?: No Does patient usually wear dentures?: No  CIWA:    COWS:     Treatment Plan Summary: Daily contact with patient to assess and evaluate symptoms and progress in treatment Medication management  Plan: 1. Continue crisis management and stabilization. 2. Medication management to reduce current symptoms to base line and improve the patient's overall level of functioning. 3. Treat health problems as indicated. 4. Develop treatment plan to decrease risk of relapse upon discharge and to reduce the need for readmission. 5. Psycho-social education regarding relapse prevention and self care. 6. Health care follow up as needed for medical problems. 7. Will initiate Seroquel 33m po BID for anxiety and agitation, with 101mat hs for sleep, depression, mood stabilization. 8. Buspar 7.5 po BID for agitation and  anxiety. 9. This provider would like to thank Dr. AkDarleene Cleaveror his assistance and support with this most difficult patient. 10. Disposition in progress. 11. Consulting with Pharm D. For discussion of medication and side effects that may affect CF negatively. 12. Added Carafate for nausea. Medical Decision Making Problem Points:  Established problem, stable/improving (1) and New problem, with no additional work-up planned (3) Data Points:  Review or order clinical lab tests (1) Review of medication regiment & side effects (2)  I certify that inpatient services furnished can reasonably be expected to improve the patient's condition.  NeMarlane HatcherMashburn RPAC 8:44 PM 10/15/2013  Patient seen, evaluated and I agree with notes by Nurse Practitioner. MoCorena PilgrimMD

## 2013-10-15 NOTE — Progress Notes (Signed)
Report received from K. Marina GoodellPerry RN. Patient currently lying in bed asleep with eyes closed and respirations even, no distress noted. Patient remains safe with 15 min checks.

## 2013-10-16 MED ORDER — BUSPIRONE HCL 10 MG PO TABS
10.0000 mg | ORAL_TABLET | Freq: Three times a day (TID) | ORAL | Status: DC
  Administered 2013-10-16 – 2013-10-20 (×12): 10 mg via ORAL
  Filled 2013-10-16: qty 42
  Filled 2013-10-16 (×4): qty 1
  Filled 2013-10-16: qty 42
  Filled 2013-10-16: qty 1
  Filled 2013-10-16: qty 42
  Filled 2013-10-16: qty 1
  Filled 2013-10-16: qty 42
  Filled 2013-10-16: qty 1
  Filled 2013-10-16: qty 42
  Filled 2013-10-16 (×5): qty 1
  Filled 2013-10-16: qty 42
  Filled 2013-10-16 (×2): qty 1

## 2013-10-16 MED ORDER — GABAPENTIN 400 MG PO CAPS
400.0000 mg | ORAL_CAPSULE | Freq: Three times a day (TID) | ORAL | Status: DC
  Administered 2013-10-16 – 2013-10-20 (×12): 400 mg via ORAL
  Filled 2013-10-16: qty 42
  Filled 2013-10-16: qty 1
  Filled 2013-10-16: qty 42
  Filled 2013-10-16 (×2): qty 1
  Filled 2013-10-16 (×3): qty 42
  Filled 2013-10-16 (×6): qty 1
  Filled 2013-10-16: qty 42
  Filled 2013-10-16 (×5): qty 1

## 2013-10-16 MED ORDER — QUETIAPINE FUMARATE 100 MG PO TABS
100.0000 mg | ORAL_TABLET | Freq: Two times a day (BID) | ORAL | Status: DC
  Administered 2013-10-16 – 2013-10-20 (×8): 100 mg via ORAL
  Filled 2013-10-16 (×7): qty 1
  Filled 2013-10-16: qty 28
  Filled 2013-10-16: qty 1
  Filled 2013-10-16 (×2): qty 28
  Filled 2013-10-16: qty 1
  Filled 2013-10-16: qty 28
  Filled 2013-10-16: qty 1

## 2013-10-16 NOTE — [facility] Group Notes (Signed)
[facility]   10/16/2013 9:43 AM  Participation Quality:  Did not attend group.   Apurva Reily Hairston Amandine Covino

## 2013-10-16 NOTE — [facility] Group Notes (Signed)
[facility] LCSW Group Therapy          Overcoming Obstacles       1:15 -2:30        10/16/2013   2:55 PM     Type of Therapy:  Group Therapy  Participation Level:  Did not attend group.   Wynn BankerHodnett, [name] Hairston 10/16/2013   2:55 PM

## 2013-10-16 NOTE — Progress Notes (Signed)
D:  Patient has been up and present in the milieu.  Has not attended groups this evening.  Has been pacing some in the hallway.  Has multiple somatic complaints.  Declined his antibiotics stating he was only on them for colitis and the last test he had indicated that he was no longer in crisis.  He also states that the antibiotics cause him increase gastric distress.  He admits to craving and is grateful for the PRN medications he has available.  He states he often feels agitated, but is doing his best to control his anger and anxiety.   A:  Medications given as prescribed except for antibiotics.  Offered dicyclomine and hydroxyzine which patient accepted.  He was also given Robaxin at HS.  Offered support and encouragement.  Also encouraged him to attend more groups.   R:  Cooperative with staff.  Medications have been effective.  Patient is less irritable and is interacting some with peers.  Safety is maintained.

## 2013-10-16 NOTE — Tx Team (Signed)
Interdisciplinary Treatment Plan Update   Date Reviewed:  10/16/2013  Time Reviewed:  8:26 AM  Progress in Treatment:   Attending groups: Yes Participating in groups: Yes Taking medication as prescribed: Yes  Tolerating medication: Yes Family/Significant other contact made:  No, but will ask patient for consent for collateral contact Patient understands diagnosis: Yes  Discussing patient identified problems/goals with staff: Yes Medical problems stabilized or resolved: Yes Denies suicidal/homicidal ideation: Yes Patient has not harmed self or others: Yes  For review of initial/current patient goals, please see plan of care.  Estimated Length of Stay:  3-4 days  Reasons for Continued Hospitalization:  Anxiety Depression Medication stabilization Suicidal ideation  New Problems/Goals identified:    Discharge Plan or Barriers:     Additional Comments:  Patient is a 33 year old WM with a history of Cystic Fibrosis who has overdosed for the second time in 3 months. He has chronic pancreatis and constant abdominal pain.    Attendees:  Patient:  10/16/2013 8:26 AM   Signature: Mervyn GayJ. Jonnalagadda, MD 10/16/2013 8:26 AM  Signature:  Verne SpurrNeil Mashburn, PA 10/16/2013 8:26 AM  Signature:   Liborio NixonPatrice White, RN 10/16/2013 8:26 AM  Signature:Beverly Terrilee CroakKnight, RN 10/16/2013 8:26 AM  Signature:  Neill Loftarol Davis RN 10/16/2013 8:26 AM  Signature:  Juline PatchQuylle Tameshia Bonneville, LCSW 10/16/2013 8:26 AM  Signature:  Reyes Ivanhelsea Horton, LCSW 10/16/2013 8:26 AM  Signature:  Leisa LenzValerie Enoch, Care Coordinator [facility] 10/16/2013 8:26 AM  Signature:   10/16/2013 8:26 AM  Signature:  10/16/2013  8:26 AM  Signature:   Onnie BoerJennifer Clark, RN [facility] 10/16/2013  8:26 AM  Signature: 10/16/2013  8:26 AM    Scribe for Treatment Team:   Juline PatchQuylle Cherisse Carrell,  10/16/2013 8:26 AM

## 2013-10-16 NOTE — Progress Notes (Signed)
Adult Psychoeducational Group Note  Date:  10/16/2013 Time:  8:54 PM  Group Topic/Focus:  Wrap-Up Group:   The focus of this group is to help patients review their daily goal of treatment and discuss progress on daily workbooks.  Participation Level:  Did Not Attend   Additional Comments:Pt did not attend group. When prompted to attend pt responded "im good"   Tenia Goh 10/16/2013, 8:54 PM

## 2013-10-16 NOTE — Progress Notes (Signed)
Patient ID: [name], male   DOB: 1981-01-23, 33 y.o.   MRN: [id number] D-Patient reports his sleep was poor and his appetite is improving.  His energy level is low and his ability to pay attention is poor. He is rating his depression at 7/10 and his hopelessness at 6/10.  He is having thoughts of self harm off and on.  He does contract for safety.  Patient complaining "I don't feel good" this am.  York SpanielSaid his legs were aching and he felt agitated and "ancy,like my skin is crawling and I can't sit still.  Said "I feel odd, hot and nauseated too".  Patient does not have a temperature.  A- Supported patient.  Talked with him about his available prns and what to expect about the schedule today.  Gave patient robaxin and he said this helped.  R- Patient seemed calmer and went to lunch with group.

## 2013-10-16 NOTE — Progress Notes (Signed)
Patient ID: [name], male   DOB: 1980/10/08, 33 y.o.   MRN: [id number] [facility] MD Progress Note  10/16/2013 1:18 PM [name]  MRN:  [id number] Subjective: Patient complained feeling on edge, skin crawling, stomach pain and withdrawal symptoms of pain medication and alcohol. He has stated depression and anxiety which is rated high and has suicidal ideation but contract for safety. He was agitated and angry yesterday morning punching the plastic case on the thermometer and received Geodon injection to help him calm down. He has increased symptoms of depression and anxiety since stopped drug of abuse.   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 He stopped following up with his own health care and "lung cleansing" even though he knew he had to do so. He has seven years history of work prior to quit working. He has a fiance'. She got a job offer in [place] and wanted to move there. He was unable to get a transfer from his current job to [place]. He burnt his bridges there by telling his supervisor. He and his fiance' moved to [place] he would spend his days drinking beer, often sneaking out before she would wake to go buy beer to hide in the house. He did this for a year until she told him to move out. He returned home and got a job quickly, but quit after two months.  He reports he gave up his first job because he "hated it, dreaded going to work each day." "I'm not going to live my life that way and he spent more time drinking.    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 Bipolar disorder Total Time spent with patient: 45 minutes  Axis I: Bipolar, Depressed  ADL's:  Intact  Sleep:  Poor  Appetite:  Poor  Suicidal Ideation:  + SI but can and does contract for safety while in hospital Homicidal Ideation:  declined AEB (as evidenced by):  Psychiatric Specialty Exam: Physical Exam  ROS  Blood pressure 102/67, pulse 90, temperature 97.4 F (36.3 C), temperature source Oral, resp. rate 16, height 6' (1.829 m), weight 71.668 kg (158 lb).Body mass index is 21.42 kg/(m^2).  General Appearance: Disheveled  Eye Sport and exercise psychologist::  Fair  Speech:  Clear and Coherent  Volume:  Normal  Mood:  Dysphoric and Irritable  Affect:  Labile  Thought Process:  Goal Directed  Orientation:  Full (Time, Place, and Person)  Thought Content:  entitled, grandiose,  Suicidal Thoughts:  Yes.  without intent/plan  Homicidal Thoughts:  No  Memory:  NA  Judgement:  Impaired  Insight:  Present  Psychomotor Activity:  Restlessness  Concentration:  Fair  Recall:  [place] of Knowledge:Good  Language: Good  Akathisia:  No  Handed:  Right  AIMS (if indicated):     Assets:  Communication Skills Desire for Improvement Housing  Sleep:  Number of Hours: 6.75   Musculoskeletal: Strength & Muscle Tone: within normal limits Gait & Station: normal Patient leans: N/A  Current Medications: Current Facility-Administered Medications  Medication Dose Route Frequency Provider Last Rate Last Dose  . alum & mag hydroxide-simeth (MAALOX/MYLANTA) 200-200-20 MG/5ML suspension 30 mL  30 mL Oral Q4H PRN Waylan Boga, NP   30 mL at  10/14/13 1653  . busPIRone (BUSPAR) tablet 7.5 mg  7.5 mg Oral TID Nena Polio, PA-C   7.5 mg at 10/16/13 1148  . ciprofloxacin (CIPRO) tablet 500 mg  500 mg Oral 2 times per day Durward Parcel, MD      . cloNIDine (CATAPRES) tablet 0.1 mg  0.1 mg Oral QID Mojeed Akintayo   0.1 mg at 10/16/13 1148   Followed by  . [START ON 10/18/2013] cloNIDine (CATAPRES) tablet 0.1 mg  0.1 mg Oral BH-qamhs Mojeed Akintayo       Followed by  . [START ON 10/20/2013] cloNIDine  (CATAPRES) tablet 0.1 mg  0.1 mg Oral QAC breakfast Mojeed Akintayo      . dicyclomine (BENTYL) tablet 20 mg  20 mg Oral Q6H PRN Mojeed Akintayo      . diphenhydrAMINE (BENADRYL) injection 50 mg  50 mg Intramuscular Once Mojeed Akintayo      . gabapentin (NEURONTIN) capsule 300 mg  300 mg Oral TID Mojeed Akintayo   300 mg at 10/16/13 1148  . hydrOXYzine (ATARAX/VISTARIL) tablet 25 mg  25 mg Oral Q6H PRN Mojeed Akintayo   25 mg at 10/16/13 0709  . lipase/protease/amylase (CREON-12/PANCREASE) capsule 6 capsule  6 capsule Oral TID WC Nena Polio, PA-C   6 capsule at 10/16/13 1147  . loperamide (IMODIUM) capsule 2-4 mg  2-4 mg Oral PRN Mojeed Akintayo      . magnesium hydroxide (MILK OF MAGNESIA) suspension 30 mL  30 mL Oral Daily PRN Waylan Boga, NP      . methocarbamol (ROBAXIN) tablet 500 mg  500 mg Oral Q8H PRN Mojeed Akintayo   500 mg at 10/16/13 0935  . metroNIDAZOLE (FLAGYL) tablet 500 mg  500 mg Oral 3 times per day Waylan Boga, NP   500 mg at 10/14/13 4128  . mirtazapine (REMERON) tablet 15 mg  15 mg Oral QHS Mojeed Akintayo   15 mg at 10/15/13 2156  . naproxen (NAPROSYN) tablet 500 mg  500 mg Oral TID WC Mojeed Akintayo   500 mg at 10/16/13 1148  . naproxen (NAPROSYN) tablet 500 mg  500 mg Oral BID PRN Mojeed Akintayo   500 mg at 10/15/13 2156  . ondansetron (ZOFRAN) tablet 4 mg  4 mg Oral Q8H PRN Nena Polio, PA-C   4 mg at 10/14/13 1818  . ondansetron (ZOFRAN-ODT) disintegrating tablet 4 mg  4 mg Oral Q6H PRN Mojeed Akintayo   4 mg at 10/16/13 0709  . pantoprazole (PROTONIX) EC tablet 40 mg  40 mg Oral Daily Nena Polio, PA-C   40 mg at 10/16/13 7867  . QUEtiapine (SEROQUEL) tablet 100 mg  100 mg Oral QHS Nena Polio, PA-C   100 mg at 10/15/13 2156  . QUEtiapine (SEROQUEL) tablet 50 mg  50 mg Oral BID Nena Polio, PA-C   50 mg at 10/16/13 0753  . sucralfate (CARAFATE) tablet 1 g  1 g Oral TID WC & HS Nena Polio, PA-C   1 g at 10/16/13 1149    Lab Results:  Results for  orders placed during the hospital encounter of 10/13/13 (from the past 48 hour(s))  COMPREHENSIVE METABOLIC PANEL     Status: Abnormal   Collection Time    10/14/13  7:35 PM      Result Value Ref Range   Sodium 142  137 - 147 mEq/L   Potassium 3.7  3.7 - 5.3 mEq/L   Chloride 103  96 - 112 mEq/L   CO2 28  19 -  32 mEq/L   Glucose, Bld 196 (*) 70 - 99 mg/dL   BUN 4 (*) 6 - 23 mg/dL   Creatinine, Ser 2.52  0.50 - 1.35 mg/dL   Calcium 8.7  8.4 - 41.5 mg/dL   Total Protein 6.1  6.0 - 8.3 g/dL   Albumin 3.6  3.5 - 5.2 g/dL   AST 23  0 - 37 U/L   ALT 20  0 - 53 U/L   Alkaline Phosphatase 80  39 - 117 U/L   Total Bilirubin 0.3  0.3 - 1.2 mg/dL   GFR calc non Af Amer >90  >90 mL/min   GFR calc Af Amer >90  >90 mL/min   Comment: (NOTE)     The eGFR has been calculated using the CKD EPI equation.     This calculation has not been validated in all clinical situations.     eGFR's persistently <90 mL/min signify possible Chronic Kidney     Disease.     Performed at [facility]  CBC WITH DIFFERENTIAL     Status: Abnormal   Collection Time    10/14/13  7:35 PM      Result Value Ref Range   WBC 6.6  4.0 - 10.5 K/uL   RBC 4.30  4.22 - 5.81 MIL/uL   Hemoglobin 13.5  13.0 - 17.0 g/dL   HCT 90.1 (*) 72.4 - 19.5 %   MCV 86.0  78.0 - 100.0 fL   MCH 31.4  26.0 - 34.0 pg   MCHC 36.5 (*) 30.0 - 36.0 g/dL   RDW 42.4  81.4 - 43.9 %   Platelets 165  150 - 400 K/uL   Neutrophils Relative % 78 (*) 43 - 77 %   Neutro Abs 5.1  1.7 - 7.7 K/uL   Lymphocytes Relative 16  12 - 46 %   Lymphs Abs 1.1  0.7 - 4.0 K/uL   Monocytes Relative 5  3 - 12 %   Monocytes Absolute 0.4  0.1 - 1.0 K/uL   Eosinophils Relative 1  0 - 5 %   Eosinophils Absolute 0.0  0.0 - 0.7 K/uL   Basophils Relative 0  0 - 1 %   Basophils Absolute 0.0  0.0 - 0.1 K/uL   Comment: Performed at [facility]    Physical Findings: AIMS: Facial and Oral Movements Muscles of Facial Expression: None,  normal Lips and Perioral Area: None, normal Jaw: None, normal Tongue: None, normal,Extremity Movements Upper (arms, wrists, hands, fingers): None, normal Lower (legs, knees, ankles, toes): None, normal, Trunk Movements Neck, shoulders, hips: None, normal, Overall Severity Severity of abnormal movements (highest score from questions above): None, normal Incapacitation due to abnormal movements: None, normal Patient's awareness of abnormal movements (rate only patient's report): No Awareness, Dental Status Current problems with teeth and/or dentures?: No Does patient usually wear dentures?: No  CIWA:  CIWA-Ar Total: 8 COWS:     Treatment Plan Summary: Daily contact with patient to assess and evaluate symptoms and progress in treatment Medication management  Plan: 1. Continue crisis management and stabilization. 2. Medication management to reduce current symptoms to base line and improve the patient's overall level of functioning. 3. Treat health problems as indicated. 4. Develop treatment plan to decrease risk of relapse upon discharge and to reduce the need for readmission. 5. Psycho-social education regarding relapse prevention and self care. 6. Health care follow up as needed for medical problems. 7. Change Seroquel 100 mg po BID  for anxiety and agitation, sleep, depression, mood stabilization. 8. Increased Buspar 10 PO BID for mood and anxiety. 9. Increased Neurontin 400 mg PO TID. 10. Disposition in progress and case discussed with treatment team.  Medical Decision Making Problem Points:  Established problem, stable/improving (1) and New problem, with no additional work-up planned (3) Data Points:  Review or order clinical lab tests (1) Review of medication regiment & side effects (2)  I certify that inpatient services furnished can reasonably be expected to improve the patient's condition.    Parke Simmers Caili Escalera 10/16/2013 1:19 PM

## 2013-10-17 MED ORDER — DIVALPROEX SODIUM ER 250 MG PO TB24
250.0000 mg | ORAL_TABLET | Freq: Every day | ORAL | Status: DC
  Administered 2013-10-17 – 2013-10-19 (×3): 250 mg via ORAL
  Filled 2013-10-17: qty 1
  Filled 2013-10-17: qty 14
  Filled 2013-10-17 (×2): qty 1
  Filled 2013-10-17: qty 14
  Filled 2013-10-17 (×3): qty 1

## 2013-10-17 MED ORDER — TRAZODONE HCL 50 MG PO TABS
50.0000 mg | ORAL_TABLET | Freq: Every evening | ORAL | Status: DC | PRN
  Administered 2013-10-17 – 2013-10-19 (×7): 50 mg via ORAL
  Filled 2013-10-17: qty 14
  Filled 2013-10-17 (×5): qty 1
  Filled 2013-10-17 (×2): qty 14
  Filled 2013-10-17 (×2): qty 1
  Filled 2013-10-17: qty 14
  Filled 2013-10-17 (×3): qty 1

## 2013-10-17 NOTE — Progress Notes (Signed)
D Pt. Denies SI and HI at this time, does complain of general overall pain rating it a 6.  A Writer offered support and encouragement,  Discussed coping skills with pt.   [facility] to [facility] admits and will not state any coping skills that help with his depression or anxiety, rates his depression at a 5 down from a 10 on admission.

## 2013-10-17 NOTE — Progress Notes (Signed)
The focus of this group is to educate the patient on the purpose and policies of crisis stabilization and provide a format to answer questions about their admission.  The group details unit policies and expectations of patients while admitted. Patient did not attend. 

## 2013-10-17 NOTE — Progress Notes (Signed)
Patient ID: [name], male   DOB: 02/07/1981, 33 y.o.   MRN: [id number] D- Patient reported he requested med for sleep but sleep was poor.  Patient had a repeat sleep med available but did not request it.  He reports his appetite is improving and his energy level is low.  His ability to pay attention is poor.  He is rating his depression at 6/10 and his hopelessness at 7/10.  He reports thoughts of self harm off and he does contract for safety. Patient has been going to some groups and resting in bed at times.  He says that his stomach is always hurting and that he is having loose stools but it is no longer as watery as previously.  He did take bentyl for cramping.  He asys he has lost 60 pounds in 2 years and the high fat diet that is good for CF is not good for his pancreatitis so he has lost a lot of weight.  A- supported patient.  R- patient says that seroquel is helping his agittion and he feels his thoughts are clearer.

## 2013-10-17 NOTE — [facility] Suicide Risk Assessment (Signed)
[facility] INPATIENT:  Family/Significant Other Suicide Prevention Education  Suicide Prevention Education:  Patient Refusal for Family/Significant Other Suicide Prevention Education: The patient [name] has refused to provide written consent for family/significant other to be provided Family/Significant Other Suicide Prevention Education during admission and/or prior to discharge.  Physician notified.  He advised of not having any family support.  Jeffie Widdowson Hairston Chaley Castellanos 10/17/2013, 3:19 PM

## 2013-10-17 NOTE — Progress Notes (Signed)
Adult Psychoeducational Group Note  Date:  10/17/2013 Time:  9:34 PM  Group Topic/Focus:  Wrap-Up Group:   The focus of this group is to help patients review their daily goal of treatment and discuss progress on daily workbooks.  Participation Level:  Active  Participation Quality:  Appropriate  Affect:  Appropriate  Cognitive:  Alert and Oriented  Insight: Appropriate  Engagement in Group:  Developing/Improving  Modes of Intervention:  Exploration  Additional Comments:  Patient rated his day as a 1. Patient stated that he learned from his recovery group to stop whining and just roll with it. Patient stated that one positive is that he was able to eat. Patient reported that one thing that makes him happy is the [employer].  [name] 10/17/2013, 9:34 PM

## 2013-10-17 NOTE — Progress Notes (Signed)
D: Pt in bed resting with eyes closed. Respirations even and unlabored. Pt appears to be in no signs of distress at this time. A: Q15min checks remains for this pt. R: Pt remains safe at this time.   

## 2013-10-17 NOTE — Progress Notes (Signed)
Patient ID: [name], male   DOB: [birth year], 33 y.o.   MRN: [id number] [facility] MD Progress Note  10/17/2013 12:59 PM [name]  MRN:  [id number] Subjective: I met with [name] who was asleep in bed during the first after lunch group. He wakes easily and tells me he does feel better. He says that he does have some hope for the future which is a first for him. He also states that he has some renewed "mental energy." He has been put on a clonidine protocol yesterday for "drug withdrawal symptoms from opiates." He has not been on opiates since he was on med floor.  He was also given Depakote 250 mg at hs on med unit which has not been restarted here. He has been up and attending some groups over the past 24 hours, and states that his appetite is OK, rates his depression as a 4/10, and his anxiety is a 4-5 /10. There appears to be much improvement since Sunday. 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 Bipolar disorder Total Time spent with patient: 45 minutes  Axis I: Bipolar, Depressed  ADL's:  Intact  Sleep: Poor  Appetite:  Poor  Suicidal Ideation:  + SI but can and does contract for safety while in hospital Homicidal Ideation:  declined AEB (as evidenced by):  Psychiatric Specialty Exam: Physical Exam  ROS  Blood pressure 110/78, pulse 84, temperature 97.8 F (36.6 C), temperature source Oral, resp. rate 16, height 6' (1.829 m), weight 71.668 kg (158 lb).Body mass index is 21.42 kg/(m^2).  General Appearance: Disheveled  Eye Sport and exercise psychologist::  Fair  Speech:  Clear and Coherent  Volume:  Normal  Mood:  Less irritable,   Affect:  Labile  Thought Process:  Goal Directed  Orientation:  Full (Time, Place, and Person)  Thought Content:  entitled, grandiose,   Suicidal Thoughts:  Yes.  without intent/plan  Homicidal Thoughts:  No  Memory:  NA  Judgement:  Impaired  Insight:  Present  Psychomotor Activity:  Restlessness  Concentration:  Fair  Recall:  [place] of Knowledge:Good  Language: Good  Akathisia:  No  Handed:  Right  AIMS (if indicated):     Assets:  Communication Skills Desire for Improvement Housing  Sleep:  Number of Hours: 4.5   Musculoskeletal: Strength & Muscle Tone: within normal limits Gait & Station: normal Patient leans: N/A  Current Medications: Current Facility-Administered Medications  Medication Dose Route Frequency Provider Last Rate Last Dose  . alum & mag hydroxide-simeth (MAALOX/MYLANTA) 200-200-20 MG/5ML suspension 30 mL  30 mL Oral Q4H PRN Waylan Boga, NP   30 mL at 10/14/13 1653  . busPIRone (BUSPAR) tablet 10 mg  10 mg Oral TID Durward Parcel, MD   10 mg at 10/17/13 1129  . ciprofloxacin (CIPRO) tablet 500 mg  500 mg Oral 2 times per day Durward Parcel, MD      . cloNIDine (CATAPRES) tablet 0.1 mg  0.1 mg Oral QID Mojeed Akintayo   0.1 mg at 10/17/13 1129   Followed by  . [START ON 10/18/2013] cloNIDine (CATAPRES) tablet 0.1 mg  0.1 mg Oral BH-qamhs Mojeed Akintayo       Followed by  . [START ON 10/20/2013] cloNIDine (CATAPRES) tablet 0.1 mg  0.1 mg Oral QAC breakfast Mojeed Akintayo      .  dicyclomine (BENTYL) tablet 20 mg  20 mg Oral Q6H PRN Mojeed Akintayo   20 mg at 10/17/13 1142  . diphenhydrAMINE (BENADRYL) injection 50 mg  50 mg Intramuscular Once Mojeed Akintayo      . gabapentin (NEURONTIN) capsule 400 mg  400 mg Oral TID Durward Parcel, MD   400 mg at 10/17/13 1130  . hydrOXYzine (ATARAX/VISTARIL) tablet 25 mg  25 mg Oral Q6H PRN Mojeed Akintayo   25 mg at 10/16/13 1825  . lipase/protease/amylase (CREON-12/PANCREASE) capsule 6 capsule  6 capsule Oral TID WC Nena Polio, PA-C   6 capsule at 10/17/13 1129  . loperamide (IMODIUM) capsule 2-4 mg  2-4 mg Oral PRN  Mojeed Akintayo      . magnesium hydroxide (MILK OF MAGNESIA) suspension 30 mL  30 mL Oral Daily PRN Waylan Boga, NP      . methocarbamol (ROBAXIN) tablet 500 mg  500 mg Oral Q8H PRN Mojeed Akintayo   500 mg at 10/17/13 1054  . metroNIDAZOLE (FLAGYL) tablet 500 mg  500 mg Oral 3 times per day Waylan Boga, NP   500 mg at 10/14/13 8786  . mirtazapine (REMERON) tablet 15 mg  15 mg Oral QHS Mojeed Akintayo   15 mg at 10/16/13 2157  . naproxen (NAPROSYN) tablet 500 mg  500 mg Oral TID WC Mojeed Akintayo   500 mg at 10/17/13 1129  . naproxen (NAPROSYN) tablet 500 mg  500 mg Oral BID PRN Mojeed Akintayo   500 mg at 10/15/13 2156  . ondansetron (ZOFRAN) tablet 4 mg  4 mg Oral Q8H PRN Nena Polio, PA-C   4 mg at 10/14/13 1818  . ondansetron (ZOFRAN-ODT) disintegrating tablet 4 mg  4 mg Oral Q6H PRN Mojeed Akintayo   4 mg at 10/16/13 0709  . pantoprazole (PROTONIX) EC tablet 40 mg  40 mg Oral Daily Nena Polio, PA-C   40 mg at 10/17/13 0817  . QUEtiapine (SEROQUEL) tablet 100 mg  100 mg Oral BID Durward Parcel, MD   100 mg at 10/17/13 0817  . sucralfate (CARAFATE) tablet 1 g  1 g Oral TID WC & HS Nena Polio, PA-C   1 g at 10/17/13 1129  . traZODone (DESYREL) tablet 50 mg  50 mg Oral QHS,MR X 1 Doren E Simon, PA-C   50 mg at 10/17/13 0018    Lab Results:  No results found for this or any previous visit (from the past 48 hour(s)).  Physical Findings: AIMS: Facial and Oral Movements Muscles of Facial Expression: None, normal Lips and Perioral Area: None, normal Jaw: None, normal Tongue: None, normal,Extremity Movements Upper (arms, wrists, hands, fingers): None, normal Lower (legs, knees, ankles, toes): None, normal, Trunk Movements Neck, shoulders, hips: None, normal, Overall Severity Severity of abnormal movements (highest score from questions above): None, normal Incapacitation due to abnormal movements: None, normal Patient's awareness of abnormal movements (rate only  patient's report): No Awareness, Dental Status Current problems with teeth and/or dentures?: No Does patient usually wear dentures?: No  CIWA:  CIWA-Ar Total: 8 COWS:  COWS Total Score: 3  Treatment Plan Summary: Daily contact with patient to assess and evaluate symptoms and progress in treatment Medication management  NEW: 1. Will start Depakote 229m po at hs. As originally planned. Plan: 1. Continue crisis management and stabilization. 2. Medication management to reduce current symptoms to base line and improve the patient's overall level of functioning. 3. Treat health problems as indicated. 4. Develop treatment plan to decrease risk of  relapse upon discharge and to reduce the need for readmission. 5. Psycho-social education regarding relapse prevention and self care. 6. Health care follow up as needed for medical problems. 7. Continue Seroquel 100 mg po BID for anxiety and agitation, sleep, depression, mood stabilization. 8. Continue Buspar 10 PO BID for mood and anxiety. 9. Continue Neurontin 400 mg PO TID. 10. Disposition in progress and case discussed with treatment team.  Medical Decision Making Problem Points:  Established problem, stable/improving (1) and New problem, with no additional work-up planned (3) Data Points:  Review or order clinical lab tests (1) Review of medication regiment & side effects (2)  I certify that inpatient services furnished can reasonably be expected to improve the patient's condition.   Marlane Hatcher. Mashburn RPAC 3:41 PM 10/17/2013  Reviewed the information documented and agree with the treatment plan.  Parke Simmers Jaycelynn Knickerbocker 10/17/2013 5:42 PM

## 2013-10-17 NOTE — [facility] Group Notes (Signed)
[facility] LCSW Group Therapy  10/17/2013 3:03 PM  Type of Therapy:  Group Therapy  Participation Level:  Patient entered group as it was ending.   [name] [name] 10/17/2013  3:02 PM

## 2013-10-17 NOTE — Progress Notes (Signed)
Recreation Therapy Notes  Animal-Assisted Activity/Therapy (AAA/T) Program Checklist/Progress Notes Patient Eligibility Criteria Checklist & Daily Group note for Rec Tx Intervention  Date: 05.05.2015 Time: 2:45pm [address]    AAA/T Program Assumption of Risk Form signed by Patient/ or Parent Legal Guardian yes  Patient is free of allergies or sever asthma yes  Patient reports no fear of animals yes  Patient reports no history of cruelty to animals yes   Patient understands his/her participation is voluntary yes  Behavioral Response: DID NOT ATTEND.  Orville Widmann L Treasa Bradshaw, LRT/CTRS  Rami Waddle L Deidra Spease 10/17/2013 5:10 PM

## 2013-10-18 ENCOUNTER — Inpatient Hospital Stay: Payer: Self-pay

## 2013-10-18 MED ORDER — KETOROLAC TROMETHAMINE 15 MG/ML IJ SOLN
15.0000 mg | Freq: Once | INTRAMUSCULAR | Status: AC
  Administered 2013-10-18: 15 mg via INTRAMUSCULAR
  Filled 2013-10-18 (×2): qty 1

## 2013-10-18 NOTE — Progress Notes (Signed)
Patient ID: Claudina LickSpencer Spoonemore, male   DOB: October 01, 1980, 33 y.o.   MRN: [id number] Patient ID: Claudina LickSpencer Fleites, male   DOB: October 01, 1980, 33 y.o.   MRN: [id number] [facility]  10/18/2013 2:24 PM Claudina LickSpencer Schmall  MRN:  [id number]  Subjective: Patient has been fairly doing well except that he has been eating less than he should because of stomach discomfort and nausea associated with eating more. He has been struggling with maintaining his weight. He has been sleeping better and his mood has positively better since he has been placed on his medications. He has been on a clonidine protocol for "drug withdrawal symptoms from opiates which has been tolerated well. He has not been on opiates since he was on med floor and asking tramadol for pain which is chronic in nature and not has taken over two years. He has been attending groups and feels they are helpful. He rates depression as a 4/10, and his anxiety is a 4-5 /10.   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 Bipolar disorder Total Time spent with patient: 45 minutes  Axis I: Bipolar, Depressed  ADL's:  Intact  Sleep: Poor  Appetite:  Poor  Suicidal Ideation:  + SI but can and does contract for safety while in hospital Homicidal Ideation:  declined AEB (as evidenced by):  Psychiatric Specialty Exam: Physical Exam  ROS  Blood pressure 120/81, pulse 83, temperature 98.6 F (37 C), temperature source Oral, resp. rate 20, height 6' (1.829 m), weight 71.668 kg (158 lb).Body mass index is 21.42 kg/(m^2).  General Appearance: Disheveled  Eye SolicitorContact::  Fair  Speech:  Clear and Coherent  Volume:  Normal  Mood:  Less irritable,   Affect:  Labile  Thought Process:  Goal Directed  Orientation:  Full (Time, Place,  and Person)  Thought Content:  entitled, grandiose,  Suicidal Thoughts:  Yes.  without intent/plan  Homicidal Thoughts:  No  Memory:  NA  Judgement:  Impaired  Insight:  Present  Psychomotor Activity:  Restlessness  Concentration:  Fair  Recall:  Fair  Fund of Knowledge:Good  Language: Good  Akathisia:  No  Handed:  Right  AIMS (if indicated):     Assets:  Communication Skills Desire for Improvement Housing  Sleep:  Number of Hours: 6.75   Musculoskeletal: Strength & Muscle Tone: within normal limits Gait & Station: normal Patient leans: N/A  Current Medications: Current Facility-Administered Medications  Medication Dose Route Frequency Provider Last Rate Last Dose  . alum & mag hydroxide-simeth (MAALOX/MYLANTA) 200-200-20 MG/5ML suspension 30 mL  30 mL Oral Q4H PRN Nanine MeansJamison Lord, NP   30 mL at 10/14/13 1653  . busPIRone (BUSPAR) tablet 10 mg  10 mg Oral TID Nehemiah SettleJanardhaha R Karleigh Bunte, MD   10 mg at 10/17/13 1129  . ciprofloxacin (CIPRO) tablet 500 mg  500 mg Oral 2 times per day Nehemiah SettleJanardhaha R Clebert Wenger, MD      . cloNIDine (CATAPRES) tablet 0.1 mg  0.1 mg Oral QID Mojeed Akintayo   0.1 mg at 10/17/13 1129   Followed by  . [START ON 10/18/2013] cloNIDine (CATAPRES) tablet 0.1 mg  0.1 mg Oral BH-qamhs Mojeed Akintayo       Followed by  . [START ON 10/20/2013] cloNIDine (CATAPRES) tablet 0.1 mg  0.1 mg Oral QAC breakfast Mojeed Akintayo      .  dicyclomine (BENTYL) tablet 20 mg  20 mg Oral Q6H PRN Mojeed Akintayo   20 mg at 10/17/13 1142  . diphenhydrAMINE (BENADRYL) injection 50 mg  50 mg Intramuscular Once Mojeed Akintayo      . gabapentin (NEURONTIN) capsule 400 mg  400 mg Oral TID Nehemiah SettleJanardhaha R Dublin Cantero, MD   400 mg at 10/17/13 1130  . hydrOXYzine (ATARAX/VISTARIL) tablet 25 mg  25 mg Oral Q6H PRN Mojeed Akintayo   25 mg at 10/16/13 1825  . lipase/protease/amylase (CREON-12/PANCREASE) capsule 6 capsule  6 capsule Oral TID WC Verne SpurrNeil Mashburn, PA-C   6 capsule at 10/17/13 1129  .  loperamide (IMODIUM) capsule 2-4 mg  2-4 mg Oral PRN Mojeed Akintayo      . magnesium hydroxide (MILK OF MAGNESIA) suspension 30 mL  30 mL Oral Daily PRN Nanine MeansJamison Lord, NP      . methocarbamol (ROBAXIN) tablet 500 mg  500 mg Oral Q8H PRN Mojeed Akintayo   500 mg at 10/17/13 1054  . metroNIDAZOLE (FLAGYL) tablet 500 mg  500 mg Oral 3 times per day Nanine MeansJamison Lord, NP   500 mg at 10/14/13 [id number]  . mirtazapine (REMERON) tablet 15 mg  15 mg Oral QHS Mojeed Akintayo   15 mg at 10/16/13 2157  . naproxen (NAPROSYN) tablet 500 mg  500 mg Oral TID WC Mojeed Akintayo   500 mg at 10/17/13 1129  . naproxen (NAPROSYN) tablet 500 mg  500 mg Oral BID PRN Mojeed Akintayo   500 mg at 10/15/13 2156  . ondansetron (ZOFRAN) tablet 4 mg  4 mg Oral Q8H PRN Verne SpurrNeil Mashburn, PA-C   4 mg at 10/14/13 1818  . ondansetron (ZOFRAN-ODT) disintegrating tablet 4 mg  4 mg Oral Q6H PRN Mojeed Akintayo   4 mg at 10/16/13 0709  . pantoprazole (PROTONIX) EC tablet 40 mg  40 mg Oral Daily Verne SpurrNeil Mashburn, PA-C   40 mg at 10/17/13 0817  . QUEtiapine (SEROQUEL) tablet 100 mg  100 mg Oral BID Nehemiah SettleJanardhaha R Antwain Caliendo, MD   100 mg at 10/17/13 0817  . sucralfate (CARAFATE) tablet 1 g  1 g Oral TID WC & HS Verne SpurrNeil Mashburn, PA-C   1 g at 10/17/13 1129  . traZODone (DESYREL) tablet 50 mg  50 mg Oral QHS,MR X 1 Lavaris E Simon, PA-C   50 mg at 10/17/13 0018    Lab Results:  No results found for this or any previous visit (from the past 48 hour(s)).  Physical Findings: AIMS: Facial and Oral Movements Muscles of Facial Expression: None, normal Lips and Perioral Area: None, normal Jaw: None, normal Tongue: None, normal,Extremity Movements Upper (arms, wrists, hands, fingers): None, normal Lower (legs, knees, ankles, toes): None, normal, Trunk Movements Neck, shoulders, hips: None, normal, Overall Severity Severity of abnormal movements (highest score from questions above): None, normal Incapacitation due to abnormal movements: None,  normal Patient's awareness of abnormal movements (rate only patient's report): No Awareness, Dental Status Current problems with teeth and/or dentures?: No Does patient usually wear dentures?: No  CIWA:  CIWA-Ar Total: 2 COWS:  COWS Total Score: 0  Treatment Plan Summary: Daily contact with patient to assess and evaluate symptoms and progress in treatment Medication management  . Plan: 1. Continue crisis management and stabilization. 2. Medication management to reduce current symptoms to base line and improve the patient's overall level of functioning. 3. Treat health problems as indicated. 4. Develop treatment plan to decrease risk of relapse upon discharge and to reduce the need for readmission. Patient  is on clonidine protocol of opioid detox and at the same time seeking tramadol which may be not a good idea to start while in hospital and encouraged him to seek from primary care physician as a out patient when psychiatrically stable 5. Psycho-social education regarding relapse prevention and self care. 6. Health care follow up as needed for medical problems. 7. Continue Seroquel 100 mg po BID for anxiety and agitation, sleep, depression, mood stabilization. 8. Continue Buspar 10 PO BID for mood and anxiety. 9. Continue Neurontin 400 mg PO TID 10. Continue Depakote 250mg  PO Qhs for mood swings 10. Disposition in progress and case discussed with treatment team.  Medical Decision Making Problem Points:  Established problem, stable/improving (1) and New problem, with no additional work-up planned (3) Data Points:  Review or order clinical lab tests (1) Review of medication regiment & side effects (2)  I certify that inpatient services furnished can reasonably be expected to improve the patient's condition.    Randal Buba Tanique Matney 10/18/2013 2:24 PM

## 2013-10-18 NOTE — [facility] Group Notes (Signed)
[facility]   10/18/2013 10:56 AM    Participation Quality:  Appropraite  Mood/Affect:  Appropriate  Depression Rating:  7  Anxiety Rating:  4  Thoughts of Suicide:  No  Will you contract for safety?   NA  Current AVH:  No  Plan for Discharge/Comments:  Patient attended discharge planning group and actively participated in group.  [facility]  CSW provided all participants with daily workbook.   Transportation Means: Patient has transportation.   Supports:  Patient has a support system.   Kahiau Schewe, Joesph JulyQuylle Hairston
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
## 2013-10-18 NOTE — Progress Notes (Signed)
D: Pt reports having constant depression and anxiety r/t to not having the appropriate pain relief. However, he does report an improvement in psychosocial symptoms from his baseline on admission. He is interested in getting tramadol. Pt has reported to group after the conclusion of our conversation. Pt continues to refuse all antibiotics at this time.  A: An order for Toradol was obtained for this pt.Continued support and availability as needed was extended to this pt. Staff continue to monitor pt with q1115min checks.  R: No adverse drug reactions noted. Pt reports pain relief with Toradol. Pt receptive to treatment. Pt remains safe at this time.

## 2013-10-18 NOTE — [facility] Group Notes (Signed)
Adult Psychoeducational Group Note  Date:  10/18/2013 Time:  10:26 PM  Group Topic/Focus:  Wrap-Up Group:   The focus of this group is to help patients review their daily goal of treatment and discuss progress on daily workbooks.  Participation Level:  Active  Participation Quality:  Attentive  Affect:  Appropriate  Cognitive:  Oriented  Insight: Appropriate  Engagement in Group:  Engaged  Modes of Intervention:  Discussion  Additional Comments:  Pt stated that his goal was to go to every group. He achieved that goal. He stated that group doesn't really work that well for him because he feels like he knows the information already. He states that he feels that he hasn't been living up to his potential and that he is a very smart person, its just that he hasn't been doing what he needed to do. Pt states that he wants to become a Psych doctor because he feels that he would help patients better because he would be able to empathize with them.  Daja Shuping L Twylia Oka 10/18/2013, 10:26 PM

## 2013-10-18 NOTE — Tx Team (Signed)
Interdisciplinary Treatment Plan Update   Date Reviewed:  10/18/2013  Time Reviewed:  9:59 AM  Progress in Treatment:   Attending groups: Yes Participating in groups: Yes Taking medication as prescribed: Yes  Tolerating medication: Yes Family/Significant other contact made:  No, patient declined collateral contact Patient understands diagnosis: Yes  Discussing patient identified problems/goals with staff: Yes Medical problems stabilized or resolved: Yes Denies suicidal/homicidal ideation: Yes Patient has not harmed self or others: Yes  For review of initial/current patient goals, please see plan of care.  Estimated Length of Stay: 1 day  Reasons for Continued Hospitalization:  Anxiety Depression Medication stabilization   New Problems/Goals identified:    Discharge Plan or Barriers:     Additional Comments:  Attendees:  Patient:  10/18/2013 9:59 AM   Signature: Mervyn GayJ. Jonnalagadda, MD 10/18/2013 9:59 AM  Signature:  Verne SpurrNeil Mashburn, PA 10/18/2013 9:59 AM  Signature:  Harold Barbanonecia Byrd, RN 10/18/2013 9:59 AM  Signature: Katheran Jamesrista Dopson, RN 10/18/2013 9:59 AM  Signature:  Cordelia Penarys Roberts, PA Student 10/18/2013 9:59 AM  Signature:  Juline PatchQuylle Kyaira Trantham, LCSW 10/18/2013 9:59 AM  Signature:  Reyes Ivanhelsea Horton, LCSW 10/18/2013 9:59 AM  Signature:  Leisa LenzValerie Enoch, Care Coordinator [facility] 10/18/2013 9:59 AM  Signature:   10/18/2013 9:59 AM  Signature:  10/18/2013  9:59 AM  Signature:   Onnie BoerJennifer Clark, RN [facility] 10/18/2013  9:59 AM  Signature: 10/18/2013  9:59 AM    Scribe for Treatment Team:   Juline PatchQuylle Cardelia Sassano,  10/18/2013 9:59 AM

## 2013-10-18 NOTE — Progress Notes (Signed)
D: Patient appropriate and cooperative with staff. Patient has depressed affect and mood; slightly irritable at times. He reported on the self inventory sheet that he's sleeping fair, appetite is improving, energy level is low, and ability to pay attention is poor. Patient rates depression "4" and feelings of hopelessness "1". He's attending scheduled groups throughout the day. Patient compliant with most medications.  A: Support and encouragement provided to patient. Administered scheduled medications per ordering MD. Monitor Q15 minute checks for safety.  R: Patient receptive. Denies SI/HI and auditory/visual hallucinations. Patient remains safe on the unit.

## 2013-10-19 DIAGNOSIS — F3130 Bipolar disorder, current episode depressed, mild or moderate severity, unspecified: Secondary | ICD-10-CM

## 2013-10-19 DIAGNOSIS — R45851 Suicidal ideations: Secondary | ICD-10-CM

## 2013-10-19 MED ORDER — TRAZODONE HCL 50 MG PO TABS: ORAL_TABLET | ORAL | Status: DC

## 2013-10-19 MED ORDER — SUCRALFATE 1 G PO TABS: 1.0000 g | ORAL_TABLET | Freq: Three times a day (TID) | ORAL | Status: DC

## 2013-10-19 MED ORDER — MIRTAZAPINE 15 MG PO TABS: 15.0000 mg | ORAL_TABLET | Freq: Every day | ORAL | Status: DC

## 2013-10-19 MED ORDER — HYDROXYZINE HCL 25 MG PO TABS: 25.0000 mg | ORAL_TABLET | Freq: Four times a day (QID) | ORAL | Status: DC | PRN

## 2013-10-19 MED ORDER — PANTOPRAZOLE SODIUM 40 MG PO TBEC: 40.0000 mg | DELAYED_RELEASE_TABLET | Freq: Every day | ORAL | Status: AC

## 2013-10-19 MED ORDER — GABAPENTIN 400 MG PO CAPS: 400.0000 mg | ORAL_CAPSULE | Freq: Three times a day (TID) | ORAL | Status: DC

## 2013-10-19 MED ORDER — QUETIAPINE FUMARATE 100 MG PO TABS: 100.0000 mg | ORAL_TABLET | Freq: Two times a day (BID) | ORAL | Status: DC

## 2013-10-19 MED ORDER — BUSPIRONE HCL 10 MG PO TABS: 10.0000 mg | ORAL_TABLET | Freq: Three times a day (TID) | ORAL | Status: DC

## 2013-10-19 MED ORDER — PANCRELIPASE (LIP-PROT-AMYL) 12000-38000 UNITS PO CPEP: 6.0000 | ORAL_CAPSULE | Freq: Two times a day (BID) | ORAL | Status: DC

## 2013-10-19 MED ORDER — DIVALPROEX SODIUM ER 250 MG PO TB24: 250.0000 mg | ORAL_TABLET | Freq: Every day | ORAL | Status: DC

## 2013-10-19 NOTE — Progress Notes (Signed)
Patient ID: [name], male   DOB: [birth year], 33 y.o.   MRN: [id number] [facility] MD Progress Note  10/19/2013 2:45 PM [name]  MRN:  [id number]  Subjective: Met with [name] today in his room. He reports that he is doing much better. He rates his depression as a 1-2/10. States he has made some decisions and feels that part of his problem is due to the fact that he has not set any positive goals in several years. He is considering returning to school to pursue a degree in medicine. He's considering his options and asks several questions about being a PA vs. MD.  He denies SI/HI or AVH. States his stomach pain is still present but "manageable"  He hopes to work at [employer] when he leaves here, and will live with his parents.  He continues to participate in groups and also working on his own book to improve his circumstances.  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 Bipolar disorder Total Time spent with patient: 45 minutes  Axis I: Bipolar, Depressed  ADL's:  Intact  Sleep: improved  Appetite:  Poor  Suicidal Ideation:  + SI but can and does contract for safety while in hospital Homicidal Ideation:  declined AEB (as evidenced by):  Psychiatric Specialty Exam: Physical Exam  ROS  Blood pressure 121/87, pulse 114, temperature 98 F (36.7 C), temperature source Oral, resp. rate 18, height 6' (1.829 m), weight 71.668 kg (158 lb).Body mass index is 21.42 kg/(m^2).  General Appearance: Disheveled  Eye Sport and exercise psychologist::  Fair  Speech:  Clear and Coherent  Volume:  Normal  Mood:  Less irritable,   Affect:  Labile  Thought Process:  Goal Directed  Orientation:  Full (Time, Place, and Person)  Thought Content:  entitled, grandiose,  Suicidal Thoughts:   Yes.  without intent/plan  Homicidal Thoughts:  No  Memory:  NA  Judgement:  Impaired  Insight:  Present  Psychomotor Activity:  Restlessness  Concentration:  Fair  Recall:  [place] of Knowledge:Good  Language: Good  Akathisia:  No  Handed:  Right  AIMS (if indicated):     Assets:  Communication Skills Desire for Improvement Housing  Sleep:  Number of Hours: 6.75   Musculoskeletal: Strength & Muscle Tone: within normal limits Gait & Station: normal Patient leans: N/A  Current Medications: Current Facility-Administered Medications  Medication Dose Route Frequency Provider Last Rate Last Dose  . alum & mag hydroxide-simeth (MAALOX/MYLANTA) 200-200-20 MG/5ML suspension 30 mL  30 mL Oral Q4H PRN Waylan Boga, NP   30 mL at 10/14/13 1653  . busPIRone (BUSPAR) tablet 10 mg  10 mg Oral TID Durward Parcel, MD   10 mg at 10/17/13 1129  . ciprofloxacin (CIPRO) tablet 500 mg  500 mg Oral 2 times per day Durward Parcel, MD      . cloNIDine (CATAPRES) tablet 0.1 mg  0.1 mg Oral QID Mojeed Akintayo   0.1 mg at 10/17/13 1129   Followed by  . [START ON 10/18/2013] cloNIDine (CATAPRES) tablet 0.1 mg  0.1 mg Oral BH-qamhs Mojeed Akintayo       Followed by  . [START ON 10/20/2013] cloNIDine (CATAPRES) tablet 0.1 mg  0.1 mg Oral QAC breakfast Mojeed Akintayo      . dicyclomine (BENTYL) tablet 20 mg  20  mg Oral Q6H PRN Mojeed Akintayo   20 mg at 10/17/13 1142  . diphenhydrAMINE (BENADRYL) injection 50 mg  50 mg Intramuscular Once Mojeed Akintayo      . gabapentin (NEURONTIN) capsule 400 mg  400 mg Oral TID Durward Parcel, MD   400 mg at 10/17/13 1130  . hydrOXYzine (ATARAX/VISTARIL) tablet 25 mg  25 mg Oral Q6H PRN Mojeed Akintayo   25 mg at 10/16/13 1825  . lipase/protease/amylase (CREON-12/PANCREASE) capsule 6 capsule  6 capsule Oral TID WC Nena Polio, PA-C   6 capsule at 10/17/13 1129  . loperamide (IMODIUM) capsule 2-4 mg  2-4 mg Oral PRN Mojeed Akintayo       . magnesium hydroxide (MILK OF MAGNESIA) suspension 30 mL  30 mL Oral Daily PRN Waylan Boga, NP      . methocarbamol (ROBAXIN) tablet 500 mg  500 mg Oral Q8H PRN Mojeed Akintayo   500 mg at 10/17/13 1054  . metroNIDAZOLE (FLAGYL) tablet 500 mg  500 mg Oral 3 times per day Waylan Boga, NP   500 mg at 10/14/13 7972  . mirtazapine (REMERON) tablet 15 mg  15 mg Oral QHS Mojeed Akintayo   15 mg at 10/16/13 2157  . naproxen (NAPROSYN) tablet 500 mg  500 mg Oral TID WC Mojeed Akintayo   500 mg at 10/17/13 1129  . naproxen (NAPROSYN) tablet 500 mg  500 mg Oral BID PRN Mojeed Akintayo   500 mg at 10/15/13 2156  . ondansetron (ZOFRAN) tablet 4 mg  4 mg Oral Q8H PRN Nena Polio, PA-C   4 mg at 10/14/13 1818  . ondansetron (ZOFRAN-ODT) disintegrating tablet 4 mg  4 mg Oral Q6H PRN Mojeed Akintayo   4 mg at 10/16/13 0709  . pantoprazole (PROTONIX) EC tablet 40 mg  40 mg Oral Daily Nena Polio, PA-C   40 mg at 10/17/13 0817  . QUEtiapine (SEROQUEL) tablet 100 mg  100 mg Oral BID Durward Parcel, MD   100 mg at 10/17/13 0817  . sucralfate (CARAFATE) tablet 1 g  1 g Oral TID WC & HS Nena Polio, PA-C   1 g at 10/17/13 1129  . traZODone (DESYREL) tablet 50 mg  50 mg Oral QHS,MR X 1 Aarik E Simon, PA-C   50 mg at 10/17/13 0018    Lab Results:  No results found for this or any previous visit (from the past 48 hour(s)).  Physical Findings: AIMS: Facial and Oral Movements Muscles of Facial Expression: None, normal Lips and Perioral Area: None, normal Jaw: None, normal Tongue: None, normal,Extremity Movements Upper (arms, wrists, hands, fingers): None, normal Lower (legs, knees, ankles, toes): None, normal, Trunk Movements Neck, shoulders, hips: None, normal, Overall Severity Severity of abnormal movements (highest score from questions above): None, normal Incapacitation due to abnormal movements: None, normal Patient's awareness of abnormal movements (rate only patient's report): No  Awareness, Dental Status Current problems with teeth and/or dentures?: No Does patient usually wear dentures?: No  CIWA:  CIWA-Ar Total: 2 COWS:  COWS Total Score: 0  Treatment Plan Summary: Daily contact with patient to assess and evaluate symptoms and progress in treatment Medication management  . Plan: Time spent with the patient 25-30 minutes in discussion and counseling. 1. Continue crisis management and stabilization. 2. Continue medication management by assessing side effects, dose modification as needed and therapeutic blood levels as indicated. 3. Treat health problems as indicated or consult IM as needed. 4. Continue treatment plan to decrease risk of relapse upon  discharge and to reduce the     need for readmission to incorporate the use of local resources for support. 5. Psycho-social education regarding relapse prevention through good self care to incorporate diet, exercise, stress reduction, good sleep hygiene, and reduction in external risk factors such as alcohol, tobacco, and substance abuse. 6. Gain consent for contact with family, PCP, or outside health provider as needed. 7. Disposition is in process with expected D/C home on Friday.  Medical Decision Making Problem Points:  Established problem, stable/improving (1) and New problem, with no additional work-up planned (3) Data Points:  Review or order clinical lab tests (1) Review of medication regiment & side effects (2)  I certify that inpatient services furnished can reasonably be expected to improve the patient's condition.   Marlane Hatcher. Mashburn RPAC 2:51 PM 10/19/2013  Reviewed the information documented and agree with the treatment plan.  Parke Simmers Rachyl Wuebker 10/19/2013 3:45 PM

## 2013-10-19 NOTE — [facility] Group Notes (Signed)
[facility] LCSW Group Therapy  10/19/2013 1:15 PM   Type of Therapy:  Group Therapy  Participation Level:  Did Not Attend - pt sleeping in his room  Jeffrey IvanChelsea Horton, LCSW 10/19/2013 2:50 PM

## 2013-10-19 NOTE — Progress Notes (Signed)
Adult Psychoeducational Group Note  Date:  10/19/2013 Time:  9:00 AM  Group Topic/Focus:  Morning Wellness Group  Participation Level:  Active  Participation Quality:  Appropriate and Attentive  Affect:  Appropriate  Cognitive:  Alert and Oriented  Insight: Appropriate  Engagement in Group:  Engaged  Modes of Intervention:  Discussion  Additional Comments: Patient verbalized that his goal is to attend all group sessions.  Melanee SpryRonecia E Byrd 10/19/2013, 10:48 AM

## 2013-10-19 NOTE — Progress Notes (Addendum)
Pt isolates to his room and is reading. He stated he is having stomach pain and is requesting tordol. Nurse informed pt she will speak to the NP concerning obtaining an order for this.Pt appeared appreciative. He denies Si or HI and contracts for safety. Pt stated he feels ready for discharge tomorrow. Spoke with NP, Drenda FreezeFran at 8pm concerning pts abd pain. Drenda FreezeFran stated she would review pts chart and decide what intervention would be appropriate. Pt was made aware. 9:15pm-Pt stated his abd pain is a 7/10. NP made aware and pt was given 500mg  of robaxin and 500mg  of naprosyn.pt stated he has been having diarrhea on and off for three weeks and feels it is because he is on the generic lipase pills. Pt was given gingerale to clam his stomach. 9:40pm _pt stated his abd pain is a 0/10. He presently is eating goldfish for a snack and appears comfortable.

## 2013-10-19 NOTE — Progress Notes (Signed)
D: Patient presents with flat, depressed affect and mood. He reported on the self inventory sheet that he's sleeping fair, improving appetite, low energy level and poor ability to pay attention. Patient rates depression "3" and feelings of hopelessness "1". Continues to participate in the group sessions. Adheres to most medications of current regimen; refuses Flagyl and Cipro medications.  A: Support and encouragement provided to patient. Scheduled medications administered per MD orders. Maintain Q15 minute checks for safety.  R: Patient receptive. Denies SI/HI/AVH. Patient remains safe.

## 2013-10-19 NOTE — Discharge Summary (Signed)
Physician Discharge Summary Note  Patient:  [name] is an 33 y.o., male MRN:  [id number] DOB:  July 13, 1980 Patient phone:  [phone] (home)  Patient address:   [address] [place] [address],  Total Time spent with patient: 30 minutes  Date of Admission:  10/13/2013 Date of Discharge: 10/20/2013  Reason for Admission:  MDD severe with 2nd suicide attempt  Discharge Diagnoses: Principal Problem:   Suicide attempt by drug ingestion Active Problems:   Pancreatitis   Abdominal pain   Major depression   Major depressive disorder, recurrent episode, severe, without mention of psychotic behavior   Psychiatric Specialty Exam:   Please see D/C SRA Physical Exam  ROS  Blood pressure 121/87, pulse 114, temperature 98 F (36.7 C), temperature source Oral, resp. rate 18, height 6' (1.829 m), weight 71.668 kg (158 lb).Body mass index is 21.42 kg/(m^2).    Past Psychiatric History:  Diagnosis: Depression, possible bipolar disorder   Hospitalizations: [facility], [facility]   Outpatient Care: None   Substance Abuse Care: History of alcohol   Self-Mutilation: Denies   Suicidal Attempts: Yes   Homicidal Behaviors: Denies   Violent Behaviors: History of mood swings and anger    DSM5:  Schizophrenia Disorders:   Obsessive-Compulsive Disorders:   Trauma-Stressor Disorders:   Substance/Addictive Disorders:   Depressive Disorders:    Axis Diagnosis:   Discharge Diagnoses:  AXIS I: Major Depression, Recurrent severe  AXIS II: Deferred  AXIS III:  Past Medical History   Diagnosis  Date   .  Pancreatitis    .  Cystic fibrosis    .  Alcohol abuse    .  Anxiety    .  Depression     AXIS IV: other psychosocial or environmental problems, problems related to social environment and problems with primary support group  AXIS V: 61-70 mild symptoms Level of Care:  OP  Hospital Course: Patient is a 33 year old WM with a history of Cystic Fibrosis who has overdosed for the  second time in 3 months. He has chronic pancreatis and constant abdominal pain. Today he says that his pain is 10/10. He was admitted from the medical unit where he was admitted for abdominal pain after his most recent overdose.         Rahiem had two previous psychiatric consults during his in patient admission to the medical floor. Both recommended in patient psychiatric hospitalization upon medical clearance and discharge from med unit.  He also underwent a full GI work up and it was felt that he did not have acute or chronic pancreatitis. He was felt to be medication seeking and his narcotics were discontinued. He was admitted to [facility] upon medical discharge.           Fatih  was admitted to the adult unit. He was evaluated and his symptoms were identified.  Shortly after his admission he acted out and demanded pain medication. He was also complaining of vomiting but this was not witnessed. He was given Zofran 4mg  po.  He spent a great deal of time in his room, sleeping and obviously without discomfort. He was evaluated and his symptoms were discussed and his family history of Bipolar disorder were discussed. Kawon was very angry most of the time. After much discussion and consultation with the Pharm D., the patient agreed to a trial of Seroquel during the day for anxiety as well as at hs for sleep. Medication management was discussed and initiated.  He was encouraged to participate in unit programming. And after the Seroquel was started agreed to do so. He noted some very positive response and did begin to participate in groups.  Two days after the initiation of seroquel the patient reported to the MD that he was having narcotics withdrawal and requested detox protocol with clonidine. While it was unlikely at this point that he was in physiologic withdrawal, it was possible that he could have been psychologically dependent so the clonidine was started.       The patient was evaluated each day by  a clinical provider to ascertain the patient's response to treatment.  Improvement was noted by the patient's report of decreasing symptoms, improved sleep and appetite, affect, medication tolerance, behavior, and participation in unit programming.  He was asked each day to complete a self inventory noting mood, mental status, pain, new symptoms, anxiety and concerns.         He responded well to medication and being in a therapeutic and supportive environment. Positive and appropriate behavior was noted and the patient was motivated for recovery.  The patient worked closely with the treatment team and case manager to develop a discharge plan with appropriate goals. Coping skills, problem solving as well as relaxation therapies were also part of the unit programming.         By the day of discharge he was in much improved condition than upon admission.  Symptoms were reported as significantly decreased or resolved completely. The patient denied SI/HI and voiced no AVH. He was motivated to continue taking medication with a goal of continued improvement in mental health.          Claudina LickSpencer Schatz was discharged home with a plan to follow up as noted below.  Consults:  GI and psychiatry  Significant Diagnostic Studies:  None  Discharge Vitals:   Blood pressure 121/87, pulse 114, temperature 98 F (36.7 C), temperature source Oral, resp. rate 18, height 6' (1.829 m), weight 71.668 kg (158 lb). Body mass index is 21.42 kg/(m^2). Lab Results:   No results found for this or any previous visit (from the past 72 hour(s)).  Physical Findings: AIMS: Facial and Oral Movements Muscles of Facial Expression: None, normal Lips and Perioral Area: None, normal Jaw: None, normal Tongue: None, normal,Extremity Movements Upper (arms, wrists, hands, fingers): None, normal Lower (legs, knees, ankles, toes): None, normal, Trunk Movements Neck, shoulders, hips: None, normal, Overall Severity Severity of abnormal  movements (highest score from questions above): None, normal Incapacitation due to abnormal movements: None, normal Patient's awareness of abnormal movements (rate only patient's report): No Awareness, Dental Status Current problems with teeth and/or dentures?: No Does patient usually wear dentures?: No  CIWA:  CIWA-Ar Total: 2 COWS:  COWS Total Score: 0  Psychiatric Specialty Exam: See Psychiatric Specialty Exam and Suicide Risk Assessment completed by Attending Physician prior to discharge.  Discharge destination:  Home  Is patient on multiple antipsychotic therapies at discharge:  No   Has Patient had three or more failed trials of antipsychotic monotherapy by history:  No  Recommended Plan for Multiple Antipsychotic Therapies: NA     Medication List    STOP taking these medications       ciprofloxacin 500 MG tablet  Commonly known as:  CIPRO     metroNIDAZOLE 500 MG tablet  Commonly known as:  FLAGYL      TAKE these medications     Indication   busPIRone 10 MG tablet  Commonly  known as:  BUSPAR  Take 1 tablet (10 mg total) by mouth 3 (three) times daily.   Indication:  Anxiety Disorder     divalproex 250 MG 24 hr tablet  Commonly known as:  DEPAKOTE ER  Take 1 tablet (250 mg total) by mouth daily.   Indication:  Manic Phase of Manic-Depression     gabapentin 400 MG capsule  Commonly known as:  NEURONTIN  Take 1 capsule (400 mg total) by mouth 3 (three) times daily.   Indication:  Agitation, Alcohol Withdrawal Syndrome, Trouble Sleeping, Nerve Pain, pain     hydrOXYzine 25 MG tablet  Commonly known as:  ATARAX/VISTARIL  Take 1 tablet (25 mg total) by mouth every 6 (six) hours as needed for anxiety.   Indication:  Anxiety Neurosis     lipase/protease/amylase [id number] UNITS Cpep capsule  Commonly known as:  CREON-12/PANCREASE  Take 6 capsules by mouth 2 (two) times daily.   Indication:  Pancreatic Insufficiency     mirtazapine 15 MG tablet  Commonly known as:   REMERON  Take 1 tablet (15 mg total) by mouth at bedtime.   Indication:  Trouble Sleeping, Major Depressive Disorder     pantoprazole 40 MG tablet  Commonly known as:  PROTONIX  Take 1 tablet (40 mg total) by mouth daily.   Indication:  Gastroesophageal Reflux Disease     QUEtiapine 100 MG tablet  Commonly known as:  SEROQUEL  Take 1 tablet (100 mg total) by mouth 2 (two) times daily.   Indication:  Depressive Phase of Manic-Depression, Trouble Sleeping     sucralfate 1 G tablet  Commonly known as:  CARAFATE  Take 1 tablet (1 g total) by mouth 4 (four) times daily -  with meals and at bedtime.   Indication:  Treatment to Prevent Stress Ulcers     traZODone 50 MG tablet  Commonly known as:  DESYREL  Take one tablet at bed time as needed for insomnia.   Indication:  Anxiety Disorder, Trouble Sleeping           Follow-up Information   [facility]. (Thursday, Oct 26, 2013 at 8:30 AM.  Please bring completed registration packet)    Contact information:   [address] [place], [place]   [id number]  [phone]      Follow-up recommendations:   Activities: Resume activity as tolerated. Diet: Heart healthy low sodium diet Tests: Follow up testing will be determined by your out patient provider. Comments:    Total Discharge Time:  Greater than 30 minutes.  Signed: Rona RavensNeil T. Mashburn RPAC 11:55 AM 10/20/2013  Patient was seen for psychiatric evaluation, suicide risk assessment and case discussed with treatment team and physician extender. Made appropriate disposition plans and reviewed the information documented and agree with the treatment plan.  Randal BubaJanardhaha R Besnik Febus 10/24/2013 12:50 PM

## 2013-10-20 NOTE — Progress Notes (Deleted)
D: Pt in bed resting with eyes closed. Respirations even and unlabored. Pt appears to be in no signs of distress at this time. A: Q15min checks remains for this pt. R: Pt remains safe at this time.   

## 2013-10-20 NOTE — Progress Notes (Signed)
Adult Psychoeducational Group Note  Date:  10/19/2013 Time:  10:00am  Group Topic/Focus:  Making Healthy Choices:   The focus of this group is to help patients identify negative/unhealthy choices they were using prior to admission and identify positive/healthier coping strategies to replace them upon discharge.  Participation Level:  Active  Participation Quality:  Appropriate and Attentive  Affect:  Appropriate  Cognitive:  Alert and Appropriate  Insight: Appropriate  Engagement in Group:  Engaged  Modes of Intervention:  Discussion and Education  Additional Comments:   Pt attended and participated in group. Discussion was on Leisure Lifestyle and making healthy choices. The question was asked What is one thing you can do to make a positive change in your life? Pt stated to get more exercise.  Pryor Curiaanya D Garner 10/20/2013, 3:55 AM

## 2013-10-20 NOTE — Progress Notes (Signed)
[facility] :  Will you be returning to the same living situation after discharge: Yes,  Patient reports having a place to live. At discharge, do you have transportation home?:Yes,  Patient will arrange transportation home. Do you have the ability to pay for your medications:Yes,  Patient is able to obtain medications.  Release of information consent forms completed and in the chart;  Patient's signature needed at discharge.  Patient to Follow up at: Follow-up Information   [facility]. (Thursday, 10/26/13 at 8:30 PM please bring completed registration forms.)    Contact information:   [address] [place], [place]   [id number]  [phone]      Patient denies SI/HI:   Patient no longer endorsing SI/HI or other thoughts of self harm.   Safety Planning and Suicide Prevention discussed: .Reviewed with all patients during discharge planning group  [name] 10/20/2013, 10:45 AM

## 2013-10-20 NOTE — Progress Notes (Signed)
D: Pt in bed resting with eyes closed. Respirations even and unlabored. Pt appears to be in no signs of distress at this time. A: Q15min checks remains for this pt. R: Pt remains safe at this time.   

## 2013-10-20 NOTE — Progress Notes (Signed)
Discharge Note: Discharge instructions/prescriptions/medication samples given to patient. Patient verbalized understanding of discharge instructions and prescriptions. Returned belongings to patient. Denies SI/HI/AVH. Patient d/c without incident to the lobby.

## 2013-10-20 NOTE — [facility] Suicide Risk Assessment (Signed)
   Demographic Factors:  Male, Adolescent or young adult, Caucasian and Unemployed  Total Time spent with patient: 30 minutes  Psychiatric Specialty Exam: Physical Exam  ROS  Blood pressure 135/88, pulse 76, temperature 97.5 F (36.4 C), temperature source Oral, resp. rate 18, height 6' (1.829 m), weight 71.668 kg (158 lb).Body mass index is 21.42 kg/(m^2).  General Appearance: Casual and Fairly Groomed  Patent attorneyye Contact::  Good  Speech:  Clear and Coherent  Volume:  Normal  Mood:  Euthymic  Affect:  Appropriate and Congruent  Thought Process:  Coherent and Goal Directed  Orientation:  Full (Time, Place, and Person)  Thought Content:  WDL  Suicidal Thoughts:  No  Homicidal Thoughts:  No  Memory:  Immediate;   Fair Recent;   Fair  Judgement:  Good  Insight:  Good  Psychomotor Activity:  Normal  Concentration:  Good  Recall:  Good  Fund of Knowledge:Good  Language: Good  Akathisia:  No  Handed:  Right  AIMS (if indicated):     Assets:  Communication Skills Desire for Improvement Financial Resources/Insurance Housing Intimacy Leisure Time Physical Health Resilience Social Support Talents/Skills Transportation  Sleep:  Number of Hours: 5.5    Musculoskeletal: Strength & Muscle Tone: within normal limits Gait & Station: normal Patient leans: N/A   Mental Status Per Nursing Assessment::   On Admission:     Current Mental Status by Physician: NA  Loss Factors: NA  Historical Factors: Prior suicide attempts and Family history of mental illness or substance abuse  Risk Reduction Factors:   Sense of responsibility to family, Religious beliefs about death, Living with another person, especially a relative, Positive social support, Positive therapeutic relationship and Positive coping skills or problem solving skills  Continued Clinical Symptoms:  Bipolar Disorder:   Depressive phase Depression:   Recent sense of peace/wellbeing Chronic Pain Unstable or Poor  Therapeutic Relationship Previous Psychiatric Diagnoses and Treatments Medical Diagnoses and Treatments/Surgeries  Cognitive Features That Contribute To Risk:  Polarized thinking    Suicide Risk:  Minimal: No identifiable suicidal ideation.  Patients presenting with no risk factors but with morbid ruminations; may be classified as minimal risk based on the severity of the depressive symptoms  Discharge Diagnoses:   AXIS I:  Major Depression, Recurrent severe AXIS II:  Deferred AXIS III:   Past Medical History  Diagnosis Date  . Pancreatitis   . Cystic fibrosis   . Alcohol abuse   . Anxiety   . Depression    AXIS IV:  other psychosocial or environmental problems, problems related to social environment and problems with primary support group AXIS V:  61-70 mild symptoms  Plan Of Care/Follow-up recommendations:  Activity:  As tolerated Diet:  Regular  Is patient on multiple antipsychotic therapies at discharge:  No   Has Patient had three or more failed trials of antipsychotic monotherapy by history:  No  Recommended Plan for Multiple Antipsychotic Therapies: NA    Janardhaha R Rece Zechman 10/20/2013, 12:16 PM

## 2013-10-24 NOTE — Progress Notes (Signed)
Patient Discharge Instructions:  Next Level Care Provider Has Access to the EMR, 10/24/13 [facility] access.  Jerelene ReddenSheena E Ham[place], 10/24/2013, 3:37 PM

## 2013-10-26 ENCOUNTER — Encounter (HOSPITAL_COMMUNITY): Payer: Self-pay | Admitting: Psychiatry

## 2013-10-26 ENCOUNTER — Ambulatory Visit (INDEPENDENT_AMBULATORY_CARE_PROVIDER_SITE_OTHER): Payer: BC Managed Care – PPO | Admitting: Psychiatry

## 2013-10-26 VITALS — BP 118/90 | HR 105 | Ht 72.0 in | Wt 159.0 lb

## 2013-10-26 DIAGNOSIS — F1010 Alcohol abuse, uncomplicated: Secondary | ICD-10-CM

## 2013-10-26 DIAGNOSIS — F319 Bipolar disorder, unspecified: Secondary | ICD-10-CM

## 2013-10-26 MED ORDER — DIVALPROEX SODIUM ER 500 MG PO TB24: 500.0000 mg | ORAL_TABLET | Freq: Every day | ORAL | Status: DC

## 2013-10-26 MED ORDER — MIRTAZAPINE 15 MG PO TABS: 15.0000 mg | ORAL_TABLET | Freq: Every day | ORAL | Status: DC

## 2013-10-26 MED ORDER — BUSPIRONE HCL 10 MG PO TABS: 10.0000 mg | ORAL_TABLET | Freq: Three times a day (TID) | ORAL | Status: DC

## 2013-10-26 MED ORDER — QUETIAPINE FUMARATE 100 MG PO TABS: ORAL_TABLET | ORAL | Status: DC

## 2013-10-26 NOTE — Progress Notes (Signed)
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   Hemoglobin 12.2 (*) 13.0 - 17.0 g/dL   Comment: REPEATED TO VERIFY     DELTA CHECK NOTED   HCT 34.7 (*) 39.0 - 52.0 %   MCV 89.9  78.0 - 100.0 fL   MCH 31.3  26.0 - 34.0 pg   MCHC 34.9  30.0 - 36.0 g/dL   RDW 91.4  84.8 - 03.2 %   Platelets 120 (*) 150 - 400 K/uL   Comment: REPEATED TO VERIFY     DELTA CHECK NOTED  CBC WITH DIFFERENTIAL     Status: Abnormal   Collection Time    10/12/13  3:36 AM      Result Value Ref Range   WBC 7.6  4.0 - 10.5 K/uL   RBC 4.04 (*) 4.22 - 5.81 MIL/uL   Hemoglobin 12.7 (*) 13.0 - 17.0 g/dL   HCT 16.1 (*) 63.2 - 82.8 %   MCV 88.1  78.0 - 100.0 fL   MCH  31.4  26.0 - 34.0 pg   MCHC 35.7  30.0 - 36.0 g/dL   RDW  11.9  11.5 - 15.5 %   Platelets 127 (*) 150 - 400 K/uL   Neutrophils Relative % 69  43 - 77 %   Neutro Abs 5.3  1.7 - 7.7 K/uL   Lymphocytes Relative 24  12 - 46 %   Lymphs Abs 1.8  0.7 - 4.0 K/uL   Monocytes Relative 5  3 - 12 %   Monocytes Absolute 0.4  0.1 - 1.0 K/uL   Eosinophils Relative 2  0 - 5 %   Eosinophils Absolute 0.1  0.0 - 0.7 K/uL   Basophils Relative 1  0 - 1 %   Basophils Absolute 0.0  0.0 - 0.1 K/uL  COMPREHENSIVE METABOLIC PANEL     Status: Abnormal   Collection Time    10/12/13  3:36 AM      Result Value Ref Range   Sodium 141  137 - 147 mEq/L   Potassium 3.4 (*) 3.7 - 5.3 mEq/L   Chloride 105  96 - 112 mEq/L   CO2 22  19 - 32 mEq/L   Glucose, Bld 91  70 - 99 mg/dL   BUN 4 (*) 6 - 23 mg/dL   Creatinine, Ser 0.86  0.50 - 1.35 mg/dL   Calcium 8.3 (*) 8.4 - 10.5 mg/dL   Total Protein 5.6 (*) 6.0 - 8.3 g/dL   Albumin 3.2 (*) 3.5 - 5.2 g/dL   AST 31  0 - 37 U/L   ALT 22  0 - 53 U/L   Alkaline Phosphatase 84  39 - 117 U/L   Total Bilirubin 0.7  0.3 - 1.2 mg/dL   GFR calc non Af Amer >90  >90 mL/min   GFR calc Af Amer >90  >90 mL/min   Comment: (NOTE)     The eGFR has been calculated using the CKD EPI equation.     This calculation has not been validated in all clinical situations.     eGFR's persistently <90 mL/min signify possible Chronic Kidney     Disease.  BASIC METABOLIC PANEL     Status: Abnormal   Collection Time    10/13/13  4:18 AM      Result Value Ref Range   Sodium 139  137 - 147 mEq/L   Potassium 3.5 (*) 3.7 - 5.3 mEq/L   Chloride 104  96 - 112 mEq/L   CO2 23  19 - 32 mEq/L   Glucose, Bld 88  70 - 99 mg/dL   BUN 3 (*) 6 - 23 mg/dL   Creatinine, Ser 0.82  0.50 - 1.35 mg/dL   Calcium 8.1 (*) 8.4 - 10.5 mg/dL   GFR calc non Af Amer >90  >90 mL/min   GFR calc Af Amer >90  >90 mL/min   Comment: (NOTE)     The eGFR has been calculated using the CKD EPI equation.     This calculation  has not been validated in all clinical situations.     eGFR's persistently <90 mL/min signify possible Chronic Kidney     Disease.  LIPASE, BLOOD     Status: Abnormal   Collection Time    10/13/13  4:18 AM      Result Value Ref Range   Lipase 6 (*) 11 - 59 U/L  CBC     Status: Abnormal   Collection Time    10/13/13  4:18 AM      Result Value Ref Range   WBC 4.6  4.0 - 10.5 K/uL   RBC  3.87 (*) 4.22 - 5.81 MIL/uL   Hemoglobin 12.1 (*) 13.0 - 17.0 g/dL   HCT 33.0 (*) 39.0 - 52.0 %   MCV 85.3  78.0 - 100.0 fL   MCH 31.3  26.0 - 34.0 pg   MCHC 36.7 (*) 30.0 - 36.0 g/dL   RDW 11.8  11.5 - 15.5 %   Platelets 132 (*) 150 - 400 K/uL  COMPREHENSIVE METABOLIC PANEL     Status: Abnormal   Collection Time    10/14/13  7:35 PM      Result Value Ref Range   Sodium 142  137 - 147 mEq/L   Potassium 3.7  3.7 - 5.3 mEq/L   Chloride 103  96 - 112 mEq/L   CO2 28  19 - 32 mEq/L   Glucose, Bld 196 (*) 70 - 99 mg/dL   BUN 4 (*) 6 - 23 mg/dL   Creatinine, Ser 0.94  0.50 - 1.35 mg/dL   Calcium 8.7  8.4 - 10.5 mg/dL   Total Protein 6.1  6.0 - 8.3 g/dL   Albumin 3.6  3.5 - 5.2 g/dL   AST 23  0 - 37 U/L   ALT 20  0 - 53 U/L   Alkaline Phosphatase 80  39 - 117 U/L   Total Bilirubin 0.3  0.3 - 1.2 mg/dL   GFR calc non Af Amer >90  >90 mL/min   GFR calc Af Amer >90  >90 mL/min   Comment: (NOTE)     The eGFR has been calculated using the CKD EPI equation.     This calculation has not been validated in all clinical situations.     eGFR's persistently <90 mL/min signify possible Chronic Kidney     Disease.     Performed at [facility]  CBC WITH DIFFERENTIAL     Status: Abnormal   Collection Time    10/14/13  7:35 PM      Result Value Ref Range   WBC 6.6  4.0 - 10.5 K/uL   RBC 4.30  4.22 - 5.81 MIL/uL   Hemoglobin 13.5  13.0 - 17.0 g/dL   HCT 37.0 (*) 39.0 - 52.0 %   MCV 86.0  78.0 - 100.0 fL   MCH 31.4  26.0 - 34.0 pg   MCHC 36.5 (*) 30.0 - 36.0 g/dL   RDW 11.8  11.5 - 15.5  %   Platelets 165  150 - 400 K/uL   Neutrophils Relative % 78 (*) 43 - 77 %   Neutro Abs 5.1  1.7 - 7.7 K/uL   Lymphocytes Relative 16  12 - 46 %   Lymphs Abs 1.1  0.7 - 4.0 K/uL   Monocytes Relative 5  3 - 12 %   Monocytes Absolute 0.4  0.1 - 1.0 K/uL   Eosinophils Relative 1  0 - 5 %   Eosinophils Absolute 0.0  0.0 - 0.7 K/uL   Basophils Relative 0  0 - 1 %   Basophils Absolute 0.0  0.0 - 0.1 K/uL   Comment: Performed at [facility]      Physical Exam: Constitutional:  BP 118/90  Pulse 105  Ht 6' (1.829 m)  Wt 159 lb (72.122 kg)  BMI 21.56 kg/m2  Musculoskeletal: Strength & Muscle Tone: within normal limits Gait & Station: normal Patient leans: N/A  Mental Status Examination;  Patient is casually dressed and fairly groomed.  He appears anxious and maintained poor eye contact.  His  speech is fast, pressured but coherent.  His thought processes circumstantial.  His attention and concentration is distracted.  Patient denies any auditory or visual hallucination.  He denies any active or passive suicidal thoughts or homicidal thoughts.  There were no delusions, paranoia or any obsessive parts.  His psychomotor activity is slightly increased.  He appears restless but cooperative.  There were no tremors or shakes.  He is alert and oriented x3.  His fund of knowledge is good.  His insight judgment and impulse control is fair.   Established Problem, Stable/Improving (1), Review of Psycho-Social Stressors (1), Review or order clinical lab tests (1), Decision to obtain old records (1), Review and summation of old records (2), Established Problem, Worsening (2), Review of Medication Regimen & Side Effects (2) and Review of New Medication or Change in Dosage (2)  Assessment: Axis I: Bipolar disorder, alcohol abuse  Axis II: Deferred  Axis III:  Past Medical History  Diagnosis Date  . Pancreatitis   . Cystic fibrosis   . Alcohol abuse   . Anxiety   . Depression      Axis IV: Moderate   Plan:  I review his symptoms, collateral information, history, blood results and his current medication.  His Depakote level is low.  He is taking multiple medications however he does not feel it is working very well for him.  He is taking Seroquel 100 mg twice a day, Depakote 250 mg at bedtime, Vistaril 25 mg as needed, trazodone 50 mg at bedtime,  Neurontin 400 mg 3 times a day, BuSpar 10 mg 3 times a day and Remeron 15 mg at bedtime.  Patient is not interested in counseling despite my offer for therapy and intensive outpatient program.  Patient reported that he is actively looking for a job and he may move to [place] if he find any job there.  I recommended to discontinue trazodone since it is not helping his sleep.  I recommended increase Seroquel from 100 mg twice a day to 100 mg at 2:00 and 200 mg at bedtime.  I also suggested to increase Depakote 500 mg at bedtime to help mood lability agitation and anger.  Recommended to continue BuSpar and trazodone and takes Vistaril only as needed.  Patient is scheduled to see his primary care physician this afternoon and I recommended to get his pain medication Neurontin from them.  At this time patient does not have any side effects of medication.  He is not drinking.  Discuss polypharmacy and we will consider lowering the Vistaril on his next appointment since patient is already taking BuSpar for anxiety.  Discuss in detail the risks and benefits of medication and recommended to call us back if he has any question or any concern.  I will see him again in 2-3 weeks.Time spent 25 minutes.  More than 50% of the time spent in psychoeducation, counseling and coordination of care.  Discuss safety plan that anytime having active suicidal thoughts or homicidal thoughts then patient need to call 911 or go to the local emergency room.    Chemere Steffler T., MD 10/26/2013

## 2013-10-30 ENCOUNTER — Telehealth (HOSPITAL_COMMUNITY): Payer: Self-pay | Admitting: *Deleted

## 2013-10-30 NOTE — Telephone Encounter (Signed)
VM left 5/15 @ 1514--VM received 5/18 @  0915:From Resident caring for pt.In hospital [facility] for CF.Unable to discern name of resident on message, a Dr. Dorris CarnesN------? Call back number recv'd.Dr. understands pt recently in hospital here for depresssion.Wants to discuss patient's medication regime.  Phoned number given 5/18 @ 1019: Unable to contact Resident. Calling to advise resident Dr. Dorris CarnesN---- that to discuss patient, would need Release of Information from patient. Received message at number dialed to enter extension - none given in initial call.

## 2013-11-03 ENCOUNTER — Ambulatory Visit (HOSPITAL_COMMUNITY): Payer: Self-pay | Admitting: Psychology

## 2013-11-09 ENCOUNTER — Ambulatory Visit (HOSPITAL_COMMUNITY): Payer: Self-pay | Admitting: Psychiatry

## 2013-11-15 ENCOUNTER — Ambulatory Visit (INDEPENDENT_AMBULATORY_CARE_PROVIDER_SITE_OTHER): Payer: BC Managed Care – PPO | Admitting: Psychiatry

## 2013-11-15 ENCOUNTER — Encounter (HOSPITAL_COMMUNITY): Payer: Self-pay | Admitting: Psychiatry

## 2013-11-15 VITALS — Wt 184.0 lb

## 2013-11-15 DIAGNOSIS — F319 Bipolar disorder, unspecified: Secondary | ICD-10-CM

## 2013-11-15 DIAGNOSIS — F1010 Alcohol abuse, uncomplicated: Secondary | ICD-10-CM

## 2013-11-15 MED ORDER — DIVALPROEX SODIUM ER 500 MG PO TB24: 500.0000 mg | ORAL_TABLET | Freq: Every day | ORAL | Status: DC

## 2013-11-15 MED ORDER — QUETIAPINE FUMARATE 100 MG PO TABS: ORAL_TABLET | ORAL | Status: DC

## 2013-11-15 MED ORDER — BUSPIRONE HCL 10 MG PO TABS: 10.0000 mg | ORAL_TABLET | Freq: Three times a day (TID) | ORAL | Status: DC

## 2013-11-15 MED ORDER — MIRTAZAPINE 15 MG PO TABS: 15.0000 mg | ORAL_TABLET | Freq: Every day | ORAL | Status: DC

## 2013-11-15 NOTE — Progress Notes (Signed)
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 (NOTE)     The eGFR has been calculated using the CKD EPI equation.     This calculation has not been validated in all clinical situations.     eGFR's persistently <90 mL/min signify possible Chronic Kidney     Disease.  BASIC METABOLIC PANEL     Status: Abnormal   Collection Time    10/13/13  4:18 AM      Result Value Ref Range   Sodium 139  137 - 147 mEq/L   Potassium 3.5 (*) 3.7 - 5.3 mEq/L   Chloride 104  96 - 112 mEq/L   CO2 23  19 - 32 mEq/L   Glucose, Bld 88  70 - 99 mg/dL   BUN 3 (*) 6 - 23 mg/dL   Creatinine, Ser 4.24  0.50 - 1.35 mg/dL   Calcium 8.1 (*)  8.4 - 10.5 mg/dL   GFR calc non Af Amer >90  >90 mL/min   GFR calc Af Amer >90  >90 mL/min   Comment: (NOTE)     The eGFR has been calculated using the CKD EPI equation.     This calculation has not been validated in all clinical situations.     eGFR's persistently <90 mL/min signify possible Chronic Kidney     Disease.  LIPASE, BLOOD     Status: Abnormal   Collection Time    10/13/13  4:18 AM      Result Value Ref Range   Lipase 6 (*) 11 - 59 U/L  CBC     Status: Abnormal   Collection Time    10/13/13  4:18 AM      Result Value Ref Range   WBC 4.6  4.0 - 10.5 K/uL   RBC 3.87 (*) 4.22 - 5.81 MIL/uL   Hemoglobin 12.1 (*) 13.0 - 17.0 g/dL   HCT 44.5 (*) 36.7 - 61.1 %   MCV 85.3  78.0 - 100.0 fL   MCH 31.3  26.0 - 34.0 pg   MCHC 36.7 (*) 30.0 - 36.0 g/dL   RDW 41.1  25.9 - 32.7 %   Platelets 132 (*) 150 - 400 K/uL  COMPREHENSIVE METABOLIC PANEL     Status: Abnormal   Collection Time    10/14/13  7:35 PM      Result Value Ref Range   Sodium 142  137 - 147 mEq/L   Potassium 3.7  3.7 - 5.3 mEq/L   Chloride 103  96 - 112 mEq/L   CO2 28  19 - 32 mEq/L   Glucose, Bld 196 (*) 70 - 99 mg/dL   BUN 4 (*) 6 - 23 mg/dL   Creatinine, Ser 2.88  0.50 - 1.35 mg/dL   Calcium 8.7  8.4 - 89.9 mg/dL   Total Protein 6.1  6.0 - 8.3 g/dL   Albumin 3.6  3.5 - 5.2 g/dL   AST 23  0 - 37 U/L   ALT 20  0 - 53 U/L   Alkaline Phosphatase 80  39 - 117 U/L   Total Bilirubin 0.3  0.3 - 1.2 mg/dL   GFR calc non Af Amer >90  >90 mL/min   GFR calc Af Amer >90  >90 mL/min   Comment: (NOTE)     The eGFR has been calculated using the CKD EPI equation.     This calculation has not been validated in all clinical situations.     eGFR's persistently <90 mL/min signify  possible Chronic Kidney     Disease.     Performed at [facility]  CBC WITH DIFFERENTIAL     Status: Abnormal   Collection Time    10/14/13  7:35 PM      Result Value Ref Range   WBC 6.6  4.0 - 10.5 K/uL   RBC 4.30  4.22 -  5.81 MIL/uL   Hemoglobin 13.5  13.0 - 17.0 g/dL   HCT 37.0 (*) 39.0 - 52.0 %   MCV 86.0  78.0 - 100.0 fL   MCH 31.4  26.0 - 34.0 pg   MCHC 36.5 (*) 30.0 - 36.0 g/dL   RDW 11.8  11.5 - 15.5 %   Platelets 165  150 - 400 K/uL   Neutrophils Relative % 78 (*) 43 - 77 %   Neutro Abs 5.1  1.7 - 7.7 K/uL   Lymphocytes Relative 16  12 - 46 %   Lymphs Abs 1.1  0.7 - 4.0 K/uL   Monocytes Relative 5  3 - 12 %   Monocytes Absolute 0.4  0.1 - 1.0 K/uL   Eosinophils Relative 1  0 - 5 %   Eosinophils Absolute 0.0  0.0 - 0.7 K/uL   Basophils Relative 0  0 - 1 %   Basophils Absolute 0.0  0.0 - 0.1 K/uL   Comment: Performed at [facility]      Physical Exam: Constitutional:  Wt 184 lb (83.462 kg)  Musculoskeletal: Strength & Muscle Tone: within normal limits Gait & Station: normal Patient leans: N/A  Mental Status Examination;  Patient is casually dressed and fairly groomed.  He appears anxious, guarded and maintained poor eye contact.  His speech is fast but clear and coherent.  His thought process is circumstantial.  His attention and concentration is fair.  Patient denies any auditory or visual hallucination.  He denies any active or passive suicidal thoughts or homicidal thoughts.  There were no delusions, paranoia or any obsessive parts.  His psychomotor activity is slightly increased.  He appears restless but cooperative.  There were no tremors or shakes.  He is alert and oriented x3.  His fund of knowledge is good.  His insight judgment and impulse control is fair.   Established Problem, Stable/Improving (1), Review of Psycho-Social Stressors (1), Decision to obtain old records (1), Established Problem, Worsening (2), Review of Last Therapy Session (1), Review of Medication Regimen & Side Effects (2) and Review of New Medication or Change in Dosage (2)  Assessment: Axis I: Bipolar disorder, alcohol abuse  Axis II: Deferred  Axis III:  Past Medical History   Diagnosis Date  . Pancreatitis   . Cystic fibrosis   . Alcohol abuse   . Anxiety   . Depression     Axis IV: Moderate   Plan:  Patient is still taking multiple medication and he still has irritability and frustration.  But overall his sleep has been improved.  He is taking Neurontin as prescribed by his primary care physician .  I recommended to discontinue Vistaril since he is taking BuSpar for anxiety.  One more time I offer counseling but patient refused.  I discussed in detail the risks and benefits of medication.  Patient at this time does not have any tremors or shakes.  He has gained weight but he is happy about it because he wanted to gain weight.  I will continue Remeron 15 mg at bedtime, Depakote 500 mg at bedtime, Seroquel 100  mg in the morning and 200 mg at bedtime and BuSpar 10 mg 3 times a day.  Patient has relapsed into drinking .  He minimizes his drinking and endorsed that he had drank only a small amount last night .  He denies any binge drinking.  He promised that he will not drink again because he wants to remain sober .  I recommended that if you need any help then he should call us .  I will see him again in 4 weeks.  Time spent 25 minutes.  More than 50% of the time spent in psychoeducation, counseling and coordination of care.  Discuss safety plan that anytime having active suicidal thoughts or homicidal thoughts then patient need to call 911 or go to the local emergency room.   Kenadie Royce T., MD 11/15/2013

## 2013-11-29 ENCOUNTER — Other Ambulatory Visit (HOSPITAL_COMMUNITY): Payer: Self-pay | Admitting: Physician Assistant

## 2013-12-04 ENCOUNTER — Other Ambulatory Visit (HOSPITAL_COMMUNITY): Payer: Self-pay | Admitting: Physician Assistant

## 2013-12-13 ENCOUNTER — Ambulatory Visit (HOSPITAL_COMMUNITY): Payer: Self-pay | Admitting: Psychiatry

## 2013-12-19 ENCOUNTER — Ambulatory Visit (INDEPENDENT_AMBULATORY_CARE_PROVIDER_SITE_OTHER): Payer: BC Managed Care – PPO | Admitting: Psychiatry

## 2013-12-19 ENCOUNTER — Encounter (HOSPITAL_COMMUNITY): Payer: Self-pay | Admitting: Psychiatry

## 2013-12-19 DIAGNOSIS — F1010 Alcohol abuse, uncomplicated: Secondary | ICD-10-CM

## 2013-12-19 DIAGNOSIS — F319 Bipolar disorder, unspecified: Secondary | ICD-10-CM

## 2013-12-19 MED ORDER — MIRTAZAPINE 15 MG PO TABS: 15.0000 mg | ORAL_TABLET | Freq: Every day | ORAL | Status: DC

## 2013-12-19 MED ORDER — QUETIAPINE FUMARATE 100 MG PO TABS
ORAL_TABLET | ORAL | Status: DC
Start: 2013-12-19 — End: 2014-03-12

## 2013-12-19 MED ORDER — BUPROPION HCL ER (XL) 150 MG PO TB24: 150.0000 mg | ORAL_TABLET | ORAL | Status: DC

## 2013-12-19 MED ORDER — DIVALPROEX SODIUM ER 500 MG PO TB24: 500.0000 mg | ORAL_TABLET | Freq: Every day | ORAL | Status: DC

## 2013-12-19 NOTE — Progress Notes (Signed)
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 equation.     This calculation has not been validated in all clinical situations.     eGFR's persistently <90 mL/min signify possible Chronic Kidney     Disease.  LIPASE, BLOOD     Status: Abnormal   Collection Time    10/13/13  4:18 AM      Result Value Ref Range   Lipase 6 (*) 11 - 59 U/L  CBC     Status: Abnormal   Collection Time    10/13/13  4:18 AM      Result Value Ref Range   WBC 4.6  4.0 - 10.5 K/uL   RBC 3.87 (*) 4.22 - 5.81 MIL/uL   Hemoglobin 12.1 (*)  13.0 - 17.0 g/dL   HCT 33.0 (*) 39.0 - 52.0 %   MCV 85.3  78.0 - 100.0 fL   MCH 31.3  26.0 - 34.0 pg   MCHC 36.7 (*) 30.0 - 36.0 g/dL   RDW 11.8  11.5 - 15.5 %   Platelets 132 (*) 150 - 400 K/uL  COMPREHENSIVE METABOLIC PANEL     Status: Abnormal   Collection Time    10/14/13  7:35 PM      Result Value Ref Range   Sodium 142  137 - 147 mEq/L   Potassium 3.7  3.7 - 5.3 mEq/L   Chloride 103  96 - 112 mEq/L   CO2 28  19 - 32 mEq/L   Glucose, Bld 196 (*) 70 - 99 mg/dL   BUN 4 (*) 6 - 23 mg/dL   Creatinine, Ser 0.94  0.50 - 1.35 mg/dL   Calcium 8.7  8.4 - 10.5 mg/dL   Total Protein 6.1  6.0 - 8.3 g/dL   Albumin 3.6  3.5 - 5.2 g/dL   AST 23  0 - 37 U/L   ALT 20  0 - 53 U/L   Alkaline Phosphatase 80  39 - 117 U/L   Total Bilirubin 0.3  0.3 - 1.2 mg/dL   GFR calc non Af Amer >90  >90 mL/min   GFR calc Af Amer >90  >90 mL/min   Comment: (NOTE)     The eGFR has been calculated using the CKD EPI equation.     This calculation has not been validated in all clinical situations.     eGFR's persistently <90 mL/min signify possible Chronic Kidney     Disease.     Performed at [facility]  CBC WITH DIFFERENTIAL     Status: Abnormal   Collection Time    10/14/13  7:35 PM      Result Value Ref Range   WBC 6.6  4.0 - 10.5 K/uL   RBC 4.30  4.22 - 5.81 MIL/uL   Hemoglobin 13.5  13.0 - 17.0 g/dL   HCT 37.0 (*) 39.0 - 52.0 %   MCV 86.0  78.0 - 100.0 fL   MCH 31.4  26.0 - 34.0 pg   MCHC 36.5 (*) 30.0 - 36.0 g/dL   RDW 11.8  11.5 - 15.5 %   Platelets 165  150 - 400 K/uL   Neutrophils Relative % 78 (*) 43 - 77 %   Neutro Abs 5.1  1.7 - 7.7 K/uL   Lymphocytes Relative 16  12 - 46 %   Lymphs Abs 1.1  0.7 - 4.0 K/uL   Monocytes Relative 5  3 -  12 %   Monocytes Absolute 0.4  0.1 - 1.0 K/uL   Eosinophils Relative 1  0 - 5 %   Eosinophils Absolute 0.0  0.0 - 0.7 K/uL   Basophils Relative 0  0 - 1 %   Basophils Absolute 0.0  0.0 - 0.1 K/uL   Comment: Performed at [facility]      Physical Exam: Constitutional:  BP   Pulse , patient refused vitals.  Musculoskeletal: Strength & Muscle Tone: within normal limits Gait & Station: normal Patient leans: N/A  Mental Status Examination;  Patient is casually dressed and fairly groomed.  He appears anxious but cooperative.  He maintained good eye contact.  His speech is fast but clear and coherent.  His thought process is circumstantial.  His attention and concentration is fair.  Patient denies any auditory or visual hallucination.  He denies any active or passive suicidal thoughts or homicidal thoughts.  There were no delusions, paranoia or any obsessive parts.  His psychomotor activity is slightly increased.  He appears restless but cooperative.  There were no tremors or shakes.  He is alert and oriented x3.  His fund of knowledge is good.  His insight judgment and impulse control is fair.   Established Problem, Stable/Improving (1), New problem, with additional work up planned, Review of Psycho-Social Stressors (1), Review of Last Therapy Session (1), Review of Medication Regimen & Side Effects (2) and Review of New Medication or Change in Dosage (2)  Assessment: Axis I: Bipolar disorder, alcohol abuse  Axis II: Deferred  Axis III:  Past Medical History  Diagnosis Date  . Pancreatitis   . Cystic fibrosis   . Alcohol abuse   . Anxiety   . Depression     Axis IV: Moderate   Plan:  Patient is taking Depakote, Seroquel and Remeron.  He had to stop taking BuSpar.  He is taking Neurontin prescribed by his pulmonologist.  He is now taking insulin and checking his blood sugar regularly.  Patient like to try ADD medicine to help focus and attention and able to do multitasking.  I discussed that sometime he denies some cause worsening of bipolar disorder.  However patient and sister try because he wants to work at [employer] and keep his job.  I will add Wellbutrin XL 150 mg daily.  Discuss in  detail the risks and benefits of medication.  Recommended to call us back if he feels symptoms are getting worse.  I also discussed metabolic syndrome from Seroquel and in the future we will try to cut down his morning dose of Seroquel.  Patient is not interested in counseling at this time.  The patient feels proud that he is not drinking anymore.  I recommended to call us back if he has any question or any concern.  Followup in 4 weeks.  Time spent 25 minutes.  More than 50% of the time spent in psychoeducation, counseling and coordination of care.  Discuss safety plan that anytime having active suicidal thoughts or homicidal thoughts then patient need to call 911 or go to the local emergency room.   Rashan Rounsaville T., MD 12/19/2013

## 2013-12-31 ENCOUNTER — Encounter (HOSPITAL_BASED_OUTPATIENT_CLINIC_OR_DEPARTMENT_OTHER): Payer: Self-pay | Admitting: Emergency Medicine

## 2013-12-31 ENCOUNTER — Inpatient Hospital Stay (HOSPITAL_COMMUNITY): Payer: BC Managed Care – PPO

## 2013-12-31 ENCOUNTER — Inpatient Hospital Stay (HOSPITAL_BASED_OUTPATIENT_CLINIC_OR_DEPARTMENT_OTHER)
Admission: EM | Admit: 2013-12-31 | Discharge: 2014-01-02 | DRG: 439 | Disposition: A | Discharge status: Home / Self Care | Payer: BC Managed Care – PPO | Attending: Internal Medicine | Admitting: Internal Medicine

## 2013-12-31 DIAGNOSIS — K860 Alcohol-induced chronic pancreatitis: Secondary | ICD-10-CM

## 2013-12-31 DIAGNOSIS — R109 Unspecified abdominal pain: Secondary | ICD-10-CM

## 2013-12-31 DIAGNOSIS — R1110 Vomiting, unspecified: Secondary | ICD-10-CM | POA: Diagnosis present

## 2013-12-31 DIAGNOSIS — F319 Bipolar disorder, unspecified: Secondary | ICD-10-CM

## 2013-12-31 DIAGNOSIS — Z794 Long term (current) use of insulin: Secondary | ICD-10-CM

## 2013-12-31 DIAGNOSIS — K861 Other chronic pancreatitis: Principal | ICD-10-CM

## 2013-12-31 DIAGNOSIS — F411 Generalized anxiety disorder: Secondary | ICD-10-CM | POA: Diagnosis present

## 2013-12-31 DIAGNOSIS — Z79899 Other long term (current) drug therapy: Secondary | ICD-10-CM

## 2013-12-31 DIAGNOSIS — R45851 Suicidal ideations: Secondary | ICD-10-CM

## 2013-12-31 DIAGNOSIS — K858 Other acute pancreatitis without necrosis or infection: Secondary | ICD-10-CM

## 2013-12-31 DIAGNOSIS — E1165 Type 2 diabetes mellitus with hyperglycemia: Secondary | ICD-10-CM

## 2013-12-31 DIAGNOSIS — R1115 Cyclical vomiting syndrome unrelated to migraine: Secondary | ICD-10-CM

## 2013-12-31 DIAGNOSIS — IMO0001 Reserved for inherently not codable concepts without codable children: Secondary | ICD-10-CM | POA: Diagnosis present

## 2013-12-31 DIAGNOSIS — F332 Major depressive disorder, recurrent severe without psychotic features: Secondary | ICD-10-CM

## 2013-12-31 DIAGNOSIS — F1010 Alcohol abuse, uncomplicated: Secondary | ICD-10-CM | POA: Diagnosis present

## 2013-12-31 LAB — BASIC METABOLIC PANEL
Anion gap: 21 — ABNORMAL HIGH (ref 5–15)
Anion gap: 15 (ref 5–15)
BUN: 19 mg/dL (ref 6–23)
BUN: 19 mg/dL (ref 6–23)
CO2: 21 mEq/L (ref 19–32)
CO2: 24 meq/L (ref 19–32)
Calcium: 9.1 mg/dL (ref 8.4–10.5)
Calcium: 8.3 mg/dL — ABNORMAL LOW (ref 8.4–10.5)
Chloride: 99 mEq/L (ref 96–112)
Chloride: 105 mEq/L (ref 96–112)
Creatinine, Ser: 1.1 mg/dL (ref 0.50–1.35)
Creatinine, Ser: 1 mg/dL (ref 0.50–1.35)
GFR calc Af Amer: >90 mL/min (ref >90)
GFR calc non Af Amer: 89 mL/min — ABNORMAL LOW (ref >90)
GFR calc non Af Amer: >90 mL/min (ref >90)
GLUCOSE: 145 mg/dL — AB (ref 70–99)
Glucose, Bld: 258 mg/dL — ABNORMAL HIGH (ref 70–99)
POTASSIUM: 4.3 meq/L (ref 3.7–5.3)
Potassium: 4.3 mEq/L (ref 3.7–5.3)
SODIUM: 141 meq/L (ref 137–147)
SODIUM: 144 meq/L (ref 137–147)

## 2013-12-31 LAB — HEPATIC FUNCTION PANEL
ALBUMIN: 4.6 g/dL (ref 3.5–5.2)
ALT: 19 U/L (ref 0–53)
AST: 24 U/L (ref 0–37)
Alkaline Phosphatase: 137 U/L — ABNORMAL HIGH (ref 39–117)
Bilirubin, Direct: <0.2 mg/dL (ref 0.0–0.3)
TOTAL PROTEIN: 8.4 g/dL — AB (ref 6.0–8.3)
Total Bilirubin: 0.6 mg/dL (ref 0.3–1.2)

## 2013-12-31 LAB — URINALYSIS, ROUTINE W REFLEX MICROSCOPIC
BILIRUBIN URINE: NEGATIVE
Glucose, UA: 250 mg/dL — AB
HGB URINE DIPSTICK: NEGATIVE
Ketones, ur: NEGATIVE mg/dL
Leukocytes, UA: NEGATIVE
Nitrite: NEGATIVE
PROTEIN: 100 mg/dL — AB
Specific Gravity, Urine: 1.026 (ref 1.005–1.030)
Urobilinogen, UA: 0.2 mg/dL (ref 0.0–1.0)
pH: 5 (ref 5.0–8.0)

## 2013-12-31 LAB — CBC WITH DIFFERENTIAL/PLATELET
BASOS PCT: 0 % (ref 0–1)
Basophils Absolute: 0 10*3/uL (ref 0.0–0.1)
EOS ABS: 0.1 10*3/uL (ref 0.0–0.7)
Eosinophils Relative: 1 % (ref 0–5)
HCT: 46.3 % (ref 39.0–52.0)
Hemoglobin: 16.8 g/dL (ref 13.0–17.0)
LYMPHS ABS: 2.6 10*3/uL (ref 0.7–4.0)
Lymphocytes Relative: 24 % (ref 12–46)
MCH: 30.5 pg (ref 26.0–34.0)
MCHC: 36.3 g/dL — ABNORMAL HIGH (ref 30.0–36.0)
MCV: 84.2 fL (ref 78.0–100.0)
Monocytes Absolute: 0.6 10*3/uL (ref 0.1–1.0)
Monocytes Relative: 6 % (ref 3–12)
Neutro Abs: 7.5 10*3/uL (ref 1.7–7.7)
Neutrophils Relative %: 69 % (ref 43–77)
PLATELETS: 192 10*3/uL (ref 150–400)
RBC: 5.5 MIL/uL (ref 4.22–5.81)
RDW: 13.7 % (ref 11.5–15.5)
WBC: 10.8 10*3/uL — ABNORMAL HIGH (ref 4.0–10.5)

## 2013-12-31 LAB — URINE MICROSCOPIC-ADD ON

## 2013-12-31 LAB — GLUCOSE, CAPILLARY: Glucose-Capillary: 168 mg/dL — ABNORMAL HIGH (ref 70–99)

## 2013-12-31 LAB — LIPASE, BLOOD: Lipase: 8 U/L — ABNORMAL LOW (ref 11–59)

## 2013-12-31 MED ORDER — PROMETHAZINE HCL 25 MG/ML IJ SOLN
12.5000 mg | Freq: Once | INTRAMUSCULAR | Status: AC
  Administered 2013-12-31: 12.5 mg via INTRAVENOUS
  Filled 2013-12-31: qty 1

## 2013-12-31 MED ORDER — PANTOPRAZOLE SODIUM 40 MG IV SOLR
40.0000 mg | Freq: Two times a day (BID) | INTRAVENOUS | Status: DC
  Administered 2013-12-31 – 2014-01-01 (×3): 40 mg via INTRAVENOUS
  Filled 2013-12-31 (×5): qty 40

## 2013-12-31 MED ORDER — SODIUM CHLORIDE 0.9 % IV BOLUS (SEPSIS)
2000.0000 mL | Freq: Once | INTRAVENOUS | Status: AC
Start: 2013-12-31 — End: 2013-12-31
  Administered 2013-12-31 (×2): 1000 mL via INTRAVENOUS

## 2013-12-31 MED ORDER — ONDANSETRON 8 MG/NS 50 ML IVPB
8.0000 mg | Freq: Once | INTRAVENOUS | Status: DC
  Filled 2013-12-31: qty 8

## 2013-12-31 MED ORDER — IOHEXOL 300 MG/ML  SOLN
100.0000 mL | Freq: Once | INTRAMUSCULAR | Status: AC | PRN
  Administered 2013-12-31: 100 mL via INTRAVENOUS

## 2013-12-31 MED ORDER — HYDROMORPHONE HCL PF 2 MG/ML IJ SOLN
2.0000 mg | Freq: Once | INTRAMUSCULAR | Status: AC
  Administered 2013-12-31: 2 mg via INTRAVENOUS
  Filled 2013-12-31: qty 1

## 2013-12-31 MED ORDER — IOHEXOL 300 MG/ML  SOLN
25.0000 mL | INTRAMUSCULAR | Status: AC
  Administered 2013-12-31 (×2): 25 mL via ORAL

## 2013-12-31 MED ORDER — THIAMINE HCL 100 MG/ML IJ SOLN
100.0000 mg | Freq: Every day | INTRAMUSCULAR | Status: DC
  Filled 2013-12-31 (×2): qty 1

## 2013-12-31 MED ORDER — QUETIAPINE FUMARATE 100 MG PO TABS
100.0000 mg | ORAL_TABLET | Freq: Two times a day (BID) | ORAL | Status: DC
  Administered 2013-12-31 – 2014-01-01 (×3): 100 mg via ORAL
  Filled 2013-12-31 (×5): qty 1

## 2013-12-31 MED ORDER — HYDROMORPHONE HCL PF 1 MG/ML IJ SOLN
1.0000 mg | Freq: Once | INTRAMUSCULAR | Status: AC
  Administered 2013-12-31: 1 mg via INTRAVENOUS
  Filled 2013-12-31: qty 1

## 2013-12-31 MED ORDER — ONDANSETRON HCL 4 MG/2ML IJ SOLN
INTRAMUSCULAR | Status: AC
  Administered 2013-12-31: 8 mg via INTRAVENOUS
  Filled 2013-12-31: qty 4

## 2013-12-31 MED ORDER — URSODIOL 300 MG PO CAPS
900.0000 mg | ORAL_CAPSULE | Freq: Two times a day (BID) | ORAL | Status: DC
  Administered 2013-12-31 – 2014-01-01 (×3): 900 mg via ORAL
  Filled 2013-12-31 (×5): qty 3

## 2013-12-31 MED ORDER — HYDROMORPHONE HCL PF 2 MG/ML IJ SOLN
2.0000 mg | Freq: Once | INTRAMUSCULAR | Status: DC
  Filled 2013-12-31: qty 1

## 2013-12-31 MED ORDER — URSODIOL 300 MG PO CAPS: 300.0000 mg | ORAL_CAPSULE | Freq: Every day | ORAL | Status: DC

## 2013-12-31 MED ORDER — FOLIC ACID 1 MG PO TABS
1.0000 mg | ORAL_TABLET | Freq: Every day | ORAL | Status: DC
  Administered 2013-12-31 – 2014-01-01 (×2): 1 mg via ORAL
  Filled 2013-12-31 (×2): qty 1

## 2013-12-31 MED ORDER — ADULT MULTIVITAMIN W/MINERALS CH
1.0000 | ORAL_TABLET | Freq: Every day | ORAL | Status: DC
  Administered 2013-12-31 – 2014-01-01 (×2): 1 via ORAL
  Filled 2013-12-31 (×3): qty 1

## 2013-12-31 MED ORDER — SODIUM CHLORIDE 0.9 % IV BOLUS (SEPSIS)
1000.0000 mL | Freq: Once | INTRAVENOUS | Status: AC
  Administered 2013-12-31: 1000 mL via INTRAVENOUS

## 2013-12-31 MED ORDER — VITAMIN B-1 100 MG PO TABS
100.0000 mg | ORAL_TABLET | Freq: Every day | ORAL | Status: DC
  Administered 2013-12-31 – 2014-01-01 (×2): 100 mg via ORAL
  Filled 2013-12-31 (×3): qty 1

## 2013-12-31 MED ORDER — HYDROMORPHONE HCL PF 2 MG/ML IJ SOLN
2.0000 mg | Freq: Once | INTRAMUSCULAR | Status: AC
  Administered 2013-12-31: 2 mg via INTRAVENOUS

## 2013-12-31 MED ORDER — MIRTAZAPINE 15 MG PO TABS
15.0000 mg | ORAL_TABLET | Freq: Every day | ORAL | Status: DC
  Administered 2013-12-31 – 2014-01-01 (×2): 15 mg via ORAL
  Filled 2013-12-31 (×3): qty 1

## 2013-12-31 MED ORDER — BUPROPION HCL ER (XL) 150 MG PO TB24
150.0000 mg | ORAL_TABLET | ORAL | Status: DC
  Administered 2014-01-01: 150 mg via ORAL
  Filled 2013-12-31 (×3): qty 1

## 2013-12-31 MED ORDER — LORAZEPAM 1 MG PO TABS: 1.0000 mg | ORAL_TABLET | Freq: Four times a day (QID) | ORAL | Status: DC | PRN

## 2013-12-31 MED ORDER — INSULIN ASPART 100 UNIT/ML ~~LOC~~ SOLN: 0.0000 [IU] | Freq: Every day | SUBCUTANEOUS | Status: DC

## 2013-12-31 MED ORDER — ALBUTEROL SULFATE HFA 108 (90 BASE) MCG/ACT IN AERS: 2.0000 | INHALATION_SPRAY | RESPIRATORY_TRACT | Status: DC | PRN

## 2013-12-31 MED ORDER — INSULIN ASPART 100 UNIT/ML ~~LOC~~ SOLN: 0.0000 [IU] | Freq: Three times a day (TID) | SUBCUTANEOUS | Status: DC

## 2013-12-31 MED ORDER — DIVALPROEX SODIUM ER 500 MG PO TB24
500.0000 mg | ORAL_TABLET | Freq: Every day | ORAL | Status: DC
  Administered 2014-01-01: 500 mg via ORAL
  Filled 2013-12-31 (×2): qty 1

## 2013-12-31 MED ORDER — LORAZEPAM 2 MG/ML IJ SOLN: 1.0000 mg | Freq: Four times a day (QID) | INTRAMUSCULAR | Status: DC | PRN

## 2013-12-31 MED ORDER — SODIUM CHLORIDE 0.9 % IV BOLUS (SEPSIS)
1000.0000 mL | Freq: Once | INTRAVENOUS | Status: AC
Start: 2013-12-31 — End: 2013-12-31
  Administered 2013-12-31: 1000 mL via INTRAVENOUS

## 2013-12-31 MED ORDER — ONDANSETRON HCL 4 MG/2ML IJ SOLN
4.0000 mg | Freq: Once | INTRAMUSCULAR | Status: DC
  Administered 2013-12-31: 8 mg via INTRAVENOUS

## 2013-12-31 MED ORDER — PROMETHAZINE HCL 25 MG/ML IJ SOLN
12.5000 mg | INTRAMUSCULAR | Status: DC | PRN
  Administered 2013-12-31 – 2014-01-02 (×10): 12.5 mg via INTRAVENOUS
  Filled 2013-12-31 (×10): qty 1

## 2013-12-31 MED ORDER — HYDROMORPHONE HCL PF 1 MG/ML IJ SOLN
1.0000 mg | INTRAMUSCULAR | Status: DC | PRN
  Administered 2013-12-31 – 2014-01-01 (×4): 2 mg via INTRAVENOUS
  Filled 2013-12-31 (×5): qty 2

## 2013-12-31 MED ORDER — ALBUTEROL SULFATE (2.5 MG/3ML) 0.083% IN NEBU: 2.5000 mg | INHALATION_SOLUTION | RESPIRATORY_TRACT | Status: DC | PRN

## 2013-12-31 MED ORDER — SODIUM CHLORIDE 0.9 % IV SOLN
INTRAVENOUS | Status: DC
  Administered 2013-12-31 – 2014-01-01 (×2): via INTRAVENOUS

## 2013-12-31 MED ORDER — GABAPENTIN 600 MG PO TABS
600.0000 mg | ORAL_TABLET | Freq: Three times a day (TID) | ORAL | Status: DC
  Administered 2013-12-31 – 2014-01-02 (×5): 600 mg via ORAL
  Filled 2013-12-31 (×9): qty 1

## 2013-12-31 NOTE — ED Notes (Signed)
Patient was brought in by EMS from local hotel. C/o severe abd pain, N/V. Pt has hx of pancreatitis, heavy drinker. Last ETOH consumption was last night.

## 2013-12-31 NOTE — Plan of Care (Signed)
Called by [facility] for patient [name] at [facility]  33 y.o. male with past medical history significant for cystic fibrosis, alcohol abuse, bipolar type I who had no alcohol drink in the last 3 months and then started alcohol binging yesterday. Patient presented to the ER with severe abdominal pain and intractable nausea, vomiting.  EDP requesting for admission for observation for possible acute on chronic pancreatitis and dehydration, monitor for alcohol withdrawal.  BP 149/91  Pulse 100  Temp(Src) 98.4 F (36.9 C) (Oral)  Resp 20  SpO2 95%  Accepted to team 10, obs, med surg   RAI,RIPUDEEP M.D. [facility] 12/31/2013, 5:59 PM  Pager: [phone]

## 2013-12-31 NOTE — ED Provider Notes (Signed)
CSN: [id number]     Arrival date & time 12/31/13  1218 History   First MD Initiated Contact with Patient 12/31/13 1251     Chief Complaint  Patient presents with  . Abdominal Pain     (Consider location/radiation/quality/duration/timing/severity/associated sxs/prior Treatment) HPI  [name] is a 33 y.o. male with past medical history significant for cystic fibrosis, alcohol abuse, bipolar type I, alcohol abuse complaining of severe abdominal pain and multiple episodes of nonbloody, nonbilious, no coffee-ground emesis starting this a.m. Patient states that he drank heavily last night and thinks it is setting off an episode of pancreatitis. Patient states he has not drank in several months, has no history of seizures or DTs from withdrawals. Has not had a bowel movement today. Patient denies fever, chills, chest pain, shortness of breath.   Past Medical History  Diagnosis Date  . Pancreatitis   . Cystic fibrosis   . Alcohol abuse   . Anxiety   . Depression    Past Surgical History  Procedure Laterality Date  . Cholecystectomy    . Nasal sinus surgery     Family History  Problem Relation Age of Onset  . Diabetes Mellitus II Other   . Colon cancer Other    History  Substance Use Topics  . Smoking status: Never Smoker   . Smokeless tobacco: Never Used  . Alcohol Use: Yes     Comment: heavy daily    Review of Systems  10 systems reviewed and found to be negative, except as noted in the HPI.   Allergies  Review of patient's allergies indicates no known allergies.  Home Medications   Prior to Admission medications   Medication Sig Start Date End Date Taking? Authorizing Provider  albuterol (PROAIR HFA) 108 (90 BASE) MCG/ACT inhaler Inhale into the lungs. 11/08/13 11/08/14  Historical Provider, MD  buPROPion (WELLBUTRIN XL) 150 MG 24 hr tablet Take 1 tablet (150 mg total) by mouth every morning. 12/19/13 12/19/14  Cleotis Nipper, MD  divalproex (DEPAKOTE ER) 500 MG 24 hr  tablet Take 1 tablet (500 mg total) by mouth daily. 12/19/13   Cleotis Nipper, MD  gabapentin (NEURONTIN) 600 MG tablet Take 600 mg by mouth.    Historical Provider, MD  insulin aspart (NOVOLOG) 100 UNIT/ML injection Inject into the skin.    Historical Provider, MD  lipase/protease/amylase (CREON-12/PANCREASE) 12000 UNITS CPEP capsule Take 6 capsules by mouth 2 (two) times daily. 10/19/13   Verne Spurr, PA-C  mirtazapine (REMERON) 15 MG tablet Take 1 tablet (15 mg total) by mouth at bedtime. 12/19/13   Cleotis Nipper, MD  pantoprazole (PROTONIX) 40 MG tablet Take 1 tablet (40 mg total) by mouth daily. 10/19/13   Verne Spurr, PA-C  QUEtiapine (SEROQUEL) 100 MG tablet Take 1 at 2 pm and 2 at 8 pm 12/19/13   Cleotis Nipper, MD  ursodiol (ACTIGALL) 300 MG capsule  11/08/13   Historical Provider, MD   BP 149/91  Pulse 100  Temp(Src) 98.4 F (36.9 C) (Oral)  Resp 20  SpO2 95% Physical Exam  Nursing note and vitals reviewed. Constitutional: He is oriented to person, place, and time. He appears well-developed and well-nourished. No distress.  Appears acutely uncomfortable  HENT:  Head: Normocephalic and atraumatic.  Mouth/Throat: Oropharynx is clear and moist.  Rhinorrhea clear  Eyes: Conjunctivae and EOM are normal.  Cardiovascular: Normal rate, regular rhythm and intact distal pulses.   Pulmonary/Chest: Effort normal and breath sounds normal. No stridor. No respiratory  distress. He has no wheezes. He has no rales. He exhibits no tenderness.  Abdominal: Soft. He exhibits no distension and no mass. There is tenderness. There is no rebound and no guarding.  Musculoskeletal: Normal range of motion.  Neurological: He is alert and oriented to person, place, and time.  Psychiatric: He has a normal mood and affect.    ED Course  Procedures (including critical care time) Labs Review Labs Reviewed  CBC WITH DIFFERENTIAL - Abnormal; Notable for the following:    WBC 10.8 (*)    MCHC 36.3 (*)    All other  components within normal limits  BASIC METABOLIC PANEL - Abnormal; Notable for the following:    Glucose, Bld 258 (*)    GFR calc non Af Amer 89 (*)    Anion gap 21 (*)    All other components within normal limits  HEPATIC FUNCTION PANEL - Abnormal; Notable for the following:    Total Protein 8.4 (*)    Alkaline Phosphatase 137 (*)    All other components within normal limits  LIPASE, BLOOD - Abnormal; Notable for the following:    Lipase 8 (*)    All other components within normal limits  URINALYSIS, ROUTINE W REFLEX MICROSCOPIC - Abnormal; Notable for the following:    Color, Urine AMBER (*)    Glucose, UA 250 (*)    Protein, ur 100 (*)    All other components within normal limits  BASIC METABOLIC PANEL - Abnormal; Notable for the following:    Glucose, Bld 145 (*)    Calcium 8.3 (*)    All other components within normal limits  URINE MICROSCOPIC-ADD ON - Abnormal; Notable for the following:    Bacteria, UA MANY (*)    Casts HYALINE CASTS (*)    All other components within normal limits    Imaging Review No results found.   EKG Interpretation None      MDM   Final diagnoses:  Intractable cyclical vomiting with nausea  Alcohol-induced chronic pancreatitis    Filed Vitals:   12/31/13 1449 12/31/13 1645 12/31/13 1726 12/31/13 1730  BP: 171/103 166/105  149/91  Pulse: 108  104 100  Temp: 98.4 F (36.9 C)     TempSrc: Oral     Resp: 19  20   SpO2: 95%  97% 95%    Medications  sodium chloride 0.9 % bolus 1,000 mL (0 mLs Intravenous Stopped 12/31/13 1442)  HYDROmorphone (DILAUDID) injection 1 mg (1 mg Intravenous Given 12/31/13 1319)  promethazine (PHENERGAN) injection 12.5 mg (12.5 mg Intravenous Given 12/31/13 1316)  HYDROmorphone (DILAUDID) injection 1 mg (1 mg Intravenous Given 12/31/13 1353)  sodium chloride 0.9 % bolus 1,000 mL (0 mLs Intravenous Stopped 12/31/13 1643)  promethazine (PHENERGAN) injection 12.5 mg (12.5 mg Intravenous Given 12/31/13 1510)   HYDROmorphone (DILAUDID) injection 2 mg (2 mg Intravenous Given 12/31/13 1516)  sodium chloride 0.9 % bolus 2,000 mL (1,000 mLs Intravenous New Bag/Given 12/31/13 1730)  HYDROmorphone (DILAUDID) injection 2 mg (2 mg Intravenous Given 12/31/13 1643)  ondansetron (ZOFRAN) 4 MG/2ML injection (  Not Given 12/31/13 1746)    [name] is a 33 y.o. male presenting with severe epigastric pain and multiple episodes of vomiting. States this feels like an exacerbation of his alcoholic pancreatitis. Patient has a mild white blood cell count of 10.8. Significant anion gap of 21. Normal lipase level but this may be anticipated do to chronic pancreatitis. Patient is aggressively hydrated.   Recheck of the BMET  shows that anion gap has closed. However patient is not tolerating by mouth and will require admission. Discussed case with Dr. Isidoro Donningai who [facility].  This is a shared visit with the attending physician who personally evaluated the patient and agrees with the care plan.      Wynetta Emeryicole Rosena Bartle, PA-C 12/31/13 1801

## 2013-12-31 NOTE — Progress Notes (Signed)
MD Viough called, gave order for 12.5 phenegran q4 hours prn, no pain medication at this time until MD assesses pt. Will continue to monitor

## 2013-12-31 NOTE — Progress Notes (Signed)
[facility]aged [facility] for MD assignment.  Awaiting call

## 2013-12-31 NOTE — Progress Notes (Signed)
Pt arrived to unit via stretcher by ems. Pt states pain 7/10, is currently vomiting.  Sitting in bed rocking back and forth.  Emotional support given, vitals taken.  Will continue to monitor.  Will page admissions.

## 2013-12-31 NOTE — H&P (Signed)
[facility] History and Physical  Jeffrey LickSpencer Bobier UJW:[id number]:[id number] DOB: [birth year] DOA: 12/31/2013  Referring physician: EDP PCP: Debbora PrestoMAGICK-MYERS, ISKRA, MD   Chief Complaint: Abdominal pain persistent nausea vomiting  HPI: [name] is a 33 y.o. male past medical history significant for cystic fibrosis, chronic pancreatitis, recently diagnosed diabetes mellitus alcohol abuse who presents with above complaints. He states that he had been sober for a couple of months but started drinking intermittently again>> drank last p.m. and woke up with a severe abdominal pain and persistent vomiting-nonbloody. He denies fever, dysuria, diarrhea, melena and no hematochezia. He was seen at the [facility] and labs revealed lipase low-8, blood glucose 258 and other chemistries within normal limits. He is admitted for further evaluation and management.    Review of Systems The patient denies, fever, weight loss,, vision loss, decreased hearing, hoarseness, chest pain, syncope, dyspnea on exertion, peripheral edema, balance deficits, hemoptysis, abdominal pain, melena, hematochezia, hematuria, incontinence, genital sores, muscle weakness, suspicious skin lesions, transient blindness, difficulty walking, depression, unusual weight change, abnormal bleeding, enlarged lymph nodes, angioedema, and breast masses.   Past Medical History  Diagnosis Date  . Pancreatitis   . Cystic fibrosis   . Alcohol abuse   . Anxiety   . Depression    Past Surgical History  Procedure Laterality Date  . Cholecystectomy    . Nasal sinus surgery     Social History:  reports that he has never smoked. He has never used smokeless tobacco. He reports that he drinks alcohol. He reports that he does not use illicit drugs.  No Known Allergies  Family History  Problem Relation Age of Onset  . Diabetes Mellitus II Other   . Colon cancer Other      Prior to Admission medications   Medication Sig Start Date End  Date Taking? Authorizing Provider  albuterol (PROAIR HFA) 108 (90 BASE) MCG/ACT inhaler Inhale into the lungs. 11/08/13 11/08/14  Historical Provider, MD  buPROPion (WELLBUTRIN XL) 150 MG 24 hr tablet Take 1 tablet (150 mg total) by mouth every morning. 12/19/13 12/19/14  Cleotis NipperSyed T Arfeen, MD  divalproex (DEPAKOTE ER) 500 MG 24 hr tablet Take 1 tablet (500 mg total) by mouth daily. 12/19/13   Cleotis NipperSyed T Arfeen, MD  gabapentin (NEURONTIN) 600 MG tablet Take 600 mg by mouth.    Historical Provider, MD  insulin aspart (NOVOLOG) 100 UNIT/ML injection Inject into the skin.    Historical Provider, MD  lipase/protease/amylase (CREON-12/PANCREASE) 12000 UNITS CPEP capsule Take 6 capsules by mouth 2 (two) times daily. 10/19/13   Verne SpurrNeil Mashburn, PA-C  mirtazapine (REMERON) 15 MG tablet Take 1 tablet (15 mg total) by mouth at bedtime. 12/19/13   Cleotis NipperSyed T Arfeen, MD  pantoprazole (PROTONIX) 40 MG tablet Take 1 tablet (40 mg total) by mouth daily. 10/19/13   Verne SpurrNeil Mashburn, PA-C  QUEtiapine (SEROQUEL) 100 MG tablet Take 1 at 2 pm and 2 at 8 pm 12/19/13   Cleotis NipperSyed T Arfeen, MD  ursodiol (ACTIGALL) 300 MG capsule  11/08/13   Historical Provider, MD   Physical Exam: Filed Vitals:   12/31/13 1950  BP: 145/105  Pulse: 145  Temp: 98.1 F (36.7 C)  Resp: 18    BP 145/105  Pulse 145  Temp(Src) 98.1 F (36.7 C) (Oral)  Resp 18  Ht 6' (1.829 m)  Wt 89.449 kg (197 lb 3.2 oz)  BMI 26.74 kg/m2  SpO2 91% Constitutional: Vital signs reviewed.  Patient is a well-developed and well-nourished in no acute  distress and cooperative with exam. Alert and oriented x3.  Head: Normocephalic and atraumatic Mouth: no erythema or exudates, slightly dry MM Eyes: PERRL, EOMI, conjunctivae normal, No scleral icterus.  Neck: Supple, Trachea midline normal ROM, No JVD, mass, thyromegaly, or carotid bruit present.  Cardiovascular: RRR, S1 normal, S2 normal, no MRG, pulses symmetric and intact bilaterally Pulmonary/Chest: normal respiratory effort, CTAB, no  wheezes, rales, or rhonchi Abdominal: Soft. Mild diffuse tenderness greater to the right of the umbilical area, non-distended, bowel sounds are normal, no masses, organomegaly, or guarding present.  GU: no CVA tenderness  extremities: No cyanosis and no edema  Neurological: A&O x3, Strength is normal and symmetric bilaterally, cranial nerve II-XII are grossly intact, no focal motor deficit, sensory intact to light touch bilaterally.  Skin: Warm, dry and intact. No rash, cyanosis, or clubbing.  Psychiatric: Normal mood and affect. speech and behavior is normal.               Labs on Admission:  Basic Metabolic Panel:  Recent Labs Lab 12/31/13 1300 12/31/13 1530  NA 141 144  K 4.3 4.3  CL 99 105  CO2 21 24  GLUCOSE 258* 145*  BUN 19 19  CREATININE 1.10 1.00  CALCIUM 9.1 8.3*   Liver Function Tests:  Recent Labs Lab 12/31/13 1300  AST 24  ALT 19  ALKPHOS 137*  BILITOT 0.6  PROT 8.4*  ALBUMIN 4.6    Recent Labs Lab 12/31/13 1300  LIPASE 8*   No results found for this basename: AMMONIA,  in the last 168 hours CBC:  Recent Labs Lab 12/31/13 1300  WBC 10.8*  NEUTROABS 7.5  HGB 16.8  HCT 46.3  MCV 84.2  PLT 192   Cardiac Enzymes: No results found for this basename: CKTOTAL, CKMB, CKMBINDEX, TROPONINI,  in the last 168 hours  BNP (last 3 results) No results found for this basename: PROBNP,  in the last 8760 hours CBG: No results found for this basename: GLUCAP,  in the last 168 hours  Radiological Exams on Admission: No results found.    Assessment/Plan Active Problems:   Abdominal pain/ Alcohol-induced chronic pancreatitis -With persistent vomiting as discussed above in patient with cystic fibrosis and alcohol abuse -His lipase is low at 8 which could be>> 'Burnt out' pancreas in setting of chronic pancreatitis>> but will obtain CT scan of abdomen and pelvis to further eval -Keep n.p.o., IV fluids, pain management, follow and resume pancreatic  enzymes when able to tolerate by mouth   Cystic fibrosis -He is followed at [facility] -Continue outpatient meds   Diabetes mellitus, uncontrolled -Check A1c,monitor accuchecks and cover with SS1   Alcohol abuse -counseled to quit alcohol -place on CIWA with prn ativan coverage and, follow   Bipolar disorder, unspecified -continue outpt meds     Code Status: Full Family Communication: None at bedside Disposition Plan: Admitted to MedSurg  Time spent: Greater than 30 minutes  [facility] Pager [phone]

## 2013-12-31 NOTE — ED Notes (Signed)
Pt failed fluid trial. Vomiting continues.

## 2013-12-31 NOTE — ED Notes (Addendum)
Pt brought to dept via [facility] from [facility], c/o abdominal pain worse on R side. Denies vomiting. Had unknown amounts of ETOH last night. Pt ambulatory to waiting room. Vitals per [facility], 180/90, 110, [id number].

## 2014-01-01 DIAGNOSIS — R1115 Cyclical vomiting syndrome unrelated to migraine: Secondary | ICD-10-CM

## 2014-01-01 LAB — GLUCOSE, CAPILLARY
Glucose-Capillary: 116 mg/dL — ABNORMAL HIGH (ref 70–99)
Glucose-Capillary: 163 mg/dL — ABNORMAL HIGH (ref 70–99)
Glucose-Capillary: 111 mg/dL — ABNORMAL HIGH (ref 70–99)
Glucose-Capillary: 214 mg/dL — ABNORMAL HIGH (ref 70–99)

## 2014-01-01 LAB — CBC
HEMATOCRIT: 37.4 % — AB (ref 39.0–52.0)
HEMOGLOBIN: 13.5 g/dL (ref 13.0–17.0)
MCH: 31.8 pg (ref 26.0–34.0)
MCHC: 36.1 g/dL — AB (ref 30.0–36.0)
MCV: 88 fL (ref 78.0–100.0)
Platelets: 101 10*3/uL — ABNORMAL LOW (ref 150–400)
RBC: 4.25 MIL/uL (ref 4.22–5.81)
RDW: 13.4 % (ref 11.5–15.5)
WBC: 5.5 10*3/uL (ref 4.0–10.5)

## 2014-01-01 LAB — COMPREHENSIVE METABOLIC PANEL
ALBUMIN: 3.4 g/dL — AB (ref 3.5–5.2)
ALK PHOS: 103 U/L (ref 39–117)
ALT: 16 U/L (ref 0–53)
ANION GAP: 13 (ref 5–15)
AST: 33 U/L (ref 0–37)
BILIRUBIN TOTAL: 0.7 mg/dL (ref 0.3–1.2)
BUN: 14 mg/dL (ref 6–23)
CHLORIDE: 105 meq/L (ref 96–112)
CO2: 25 mEq/L (ref 19–32)
Calcium: 7.3 mg/dL — ABNORMAL LOW (ref 8.4–10.5)
Creatinine, Ser: 0.9 mg/dL (ref 0.50–1.35)
GFR calc Af Amer: >90 mL/min (ref >90)
GFR calc non Af Amer: >90 mL/min (ref >90)
GLUCOSE: 130 mg/dL — AB (ref 70–99)
POTASSIUM: 4.3 meq/L (ref 3.7–5.3)
SODIUM: 143 meq/L (ref 137–147)
TOTAL PROTEIN: 6.3 g/dL (ref 6.0–8.3)

## 2014-01-01 LAB — HEMOGLOBIN A1C
Hgb A1c MFr Bld: 7.6 % — ABNORMAL HIGH (ref <5.7)
Mean Plasma Glucose: 171 mg/dL — ABNORMAL HIGH (ref <117)

## 2014-01-01 MED ORDER — ENOXAPARIN SODIUM 40 MG/0.4ML ~~LOC~~ SOLN
40.0000 mg | SUBCUTANEOUS | Status: DC
  Administered 2014-01-01: 40 mg via SUBCUTANEOUS
  Filled 2014-01-01 (×2): qty 0.4

## 2014-01-01 MED ORDER — HYDROMORPHONE HCL PF 1 MG/ML IJ SOLN
1.0000 mg | INTRAMUSCULAR | Status: DC | PRN
  Administered 2014-01-01 – 2014-01-02 (×6): 1 mg via INTRAVENOUS
  Filled 2014-01-01 (×6): qty 1

## 2014-01-01 MED ORDER — INSULIN ASPART 100 UNIT/ML ~~LOC~~ SOLN
0.0000 [IU] | SUBCUTANEOUS | Status: DC
  Administered 2014-01-01: 3 [IU] via SUBCUTANEOUS
  Administered 2014-01-01: 2 [IU] via SUBCUTANEOUS
  Administered 2014-01-02: 1 [IU] via SUBCUTANEOUS

## 2014-01-01 NOTE — Progress Notes (Signed)
Patient Demographics  [name], is a 33 y.o. male, DOB - [birth year], XBJ:[id number]  Admit date - 12/31/2013   Admitting Physician Ripudeep Jenna Luo, MD  Outpatient Primary MD for the patient is Debbora Presto, MD  LOS - 1   Chief Complaint  Patient presents with  . Abdominal Pain           Subjective:   [name] today has, No headache, No chest pain, complains of some mild epigastric abdominal pain currently no Nausea, No new weakness tingling or numbness, No Cough - SOB.    Assessment & Plan    1. Epigastric abdominal pain along with nausea vomiting in a patient with history of chronic pancreatitis after an episode of alcoholic binging. CT scan abdomen pelvis and lipase are stable, no nausea or emesis, blood work nonacute and unremarkable, will place on clear liquid diet along with supportive care and monitor. Counseled to quit alcohol. Says he had one binge episode in 3 months. His GI team is at [facility].    2. History of cystic fibrosis. No acute issues, supportive care.    3. Bipolar disorder. Currently stable does not feel suicidal or homicidal. Home medications will be continued unchanged which are Wellbutrin, Remeron, Neurontin, Depakote, Seroquel and Ativan as needed.     4. Alcoholic binge. She is one episode in the last 3 months, continue folic acid thymine and CIWA protocol. Should not go in DTs if not drinking on a persistent basis. Counseled to quit alcohol.     Note patient has various visits to [place], [facility] [place], [place] [facility] and [facility], says her subspecialists everywhere. Will have him follow with them post discharge.    Code Status: Full  Family Communication: None present  Disposition Plan: Home   Procedures CT scan abdomen and  pelvis   Consults      Medications  Scheduled Meds: . buPROPion  150 mg Oral [id number]  . divalproex  500 mg Oral Daily  . folic acid  1 mg Oral Daily  . gabapentin  600 mg Oral TID  . insulin aspart  0-9 Units Subcutaneous Q4H  . mirtazapine  15 mg Oral QHS  . multivitamin with minerals  1 tablet Oral Daily  . pantoprazole (PROTONIX) IV  40 mg Intravenous Q12H  . QUEtiapine  100 mg Oral BID  . thiamine  100 mg Oral Daily   Or  . thiamine  100 mg Intravenous Daily  . ursodiol  900 mg Oral BID   Continuous Infusions: . sodium chloride 75 mL/hr at 01/01/14 0828   PRN Meds:.albuterol, HYDROmorphone (DILAUDID) injection, LORazepam, LORazepam, promethazine  DVT Prophylaxis  Lovenox    Lab Results  Component Value Date   PLT 101* 01/01/2014    Antibiotics     Anti-infectives   None          Objective:   Filed Vitals:   12/31/13 1730 12/31/13 1950 01/01/14 0217 01/01/14 0542  BP: 149/91 145/105 126/83 132/90  Pulse: 100 145 79 72  Temp:  98.1 F (36.7 C) 97.7 F (36.5 C) 97.7 F (36.5 C)  TempSrc:  Oral Oral Oral  Resp:  18 18 20   Height:  6' (1.829 m)    Weight:  89.449 kg (197 lb 3.2 oz)    SpO2: 95% 91% 94% 91%    Wt Readings from Last 3 Encounters:  12/31/13 89.449 kg (197 lb 3.2 oz)  11/15/13 83.462 kg (184 lb)  10/26/13 72.122 kg (159 lb)     Intake/Output Summary (Last 24 hours) at 01/01/14 0954 Last data filed at 01/01/14 0542  Gross per 24 hour  Intake   4775 ml  Output    660 ml  Net   4115 ml     Physical Exam  Awake Alert, Oriented X 3, No new F.N deficits, Normal affect [name].AT,PERRAL Supple Neck,No JVD, No cervical lymphadenopathy appriciated.  Symmetrical Chest wall movement, Good air movement bilaterally, CTAB RRR,No Gallops,Rubs or new Murmurs, No Parasternal Heave +ve B.Sounds, Abd Soft, No tenderness, No organomegaly appriciated, No rebound - guarding or rigidity. No Cyanosis, Clubbing or edema, No new Rash or bruise       Data Review   Micro Results No results found for this or any previous visit (from the past 240 hour(s)).  Radiology Reports Ct Abdomen Pelvis W Contrast  01/01/2014   CLINICAL DATA:  Severe abdominal pain with vomiting today. History of cystic fibrosis and chronic pancreatitis.  EXAM: CT ABDOMEN AND PELVIS WITH CONTRAST  TECHNIQUE: Multidetector CT imaging of the abdomen and pelvis was performed using the standard protocol following bolus administration of intravenous contrast.  CONTRAST:  OMNIPAQUE IOHEXOL 300 MG/ML  SOLN  COMPARISON:  Prior CTs 10/03/2013 and 10/12/2013.  FINDINGS: Lung bases: Stable relatively mild chronic bronchiectasis within the lingula and right middle lobe. No focal airspace disease, pleural or pericardial effusion.  Liver: Stable changes of cirrhosis with asymmetric enlargement of the lateral segment of the left and caudate lobes. There is increased ill-defined low-density surrounding the portal vein most consistent with focal fat. No enhancing liver lesions identified. The portal vein is distended. 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  Gallbladder/Biliary system: Surgically absent. No evidence of biliary dilatation.  Pancreas: Diffuse fatty replacement of the pancreas. No focal inflammatory process.  Spleen: Stable mild splenomegaly without focal abnormality.  Adrenal glands: Stable 1.4 cm nodule adjacent to the right adrenal gland on image 15. Based on the reformatted images, this is probably exophytic from the liver, and appears unchanged. The left adrenal gland appears normal.  Kidneys/Ureters/Bladder: Both kidneys appear normal without evidence of mass, calculus or hydronephrosis. The bladder appears normal.  Bowel: The stomach, small bowel, appendix and colon demonstrate no significant findings.  Peritoneum: No ascites or peritoneal nodularity.  Lymph Nodes: No enlarged abdominal or pelvic lymph nodes. Small mesenteric nodes appear unchanged.  Vascular Structures: No  significant vascular findings.  Reproductive Organs: Unremarkable.  Abdominal wall: No abdominal wall masses or hernias.  Musculoskeletal: No acute or significant osseus findings.  IMPRESSION: 1. No acute abdominal pelvic findings. 2. Cirrhosis with focal fat surrounding the portal vein and stable changes of portal hypertension. 3. Stable fatty replacement of the pancreas.   Electronically Signed   By: Roxy Horseman M.D.   On: 01/01/2014 00:21    CBC  Recent Labs Lab 12/31/13 1300 01/01/14 0404  WBC 10.8* 5.5  HGB 16.8 13.5  HCT 46.3 37.4*  PLT 192 101*  MCV 84.2 88.0  MCH 30.5 31.8  MCHC 36.3* 36.1*  RDW 13.7 13.4  LYMPHSABS 2.6  --   MONOABS 0.6  --   EOSABS 0.1  --   BASOSABS 0.0  --     Chemistries   Recent Labs Lab 12/31/13 1300  12/31/13 1530 01/01/14 0404  NA 141 144 143  K 4.3 4.3 4.3  CL 99 105 105  CO2 21 24 25   GLUCOSE 258* 145* 130*  BUN 19 19 14   CREATININE 1.10 1.00 0.90  CALCIUM 9.1 8.3* 7.3*  AST 24  --  33  ALT 19  --  16  ALKPHOS 137*  --  103  BILITOT 0.6  --  0.7   ------------------------------------------------------------------------------------------------------------------ estimated creatinine clearance is 131.7 ml/min (by C-G formula based on Cr of 0.9). ------------------------------------------------------------------------------------------------------------------ No results found for this basename: HGBA1C,  in the last 72 hours ------------------------------------------------------------------------------------------------------------------ No results found for this basename: CHOL, HDL, LDLCALC, TRIG, CHOLHDL, LDLDIRECT,  in the last 72 hours ------------------------------------------------------------------------------------------------------------------ No results found for this basename: TSH, T4TOTAL, FREET3, T3FREE, THYROIDAB,  in the last 72  hours ------------------------------------------------------------------------------------------------------------------ No results found for this basename: VITAMINB12, FOLATE, FERRITIN, TIBC, IRON, RETICCTPCT,  in the last 72 hours  Coagulation profile No results found for this basename: INR, PROTIME,  in the last 168 hours  No results found for this basename: DDIMER,  in the last 72 hours  Cardiac Enzymes No results found for this basename: CK, CKMB, TROPONINI, MYOGLOBIN,  in the last 168 hours ------------------------------------------------------------------------------------------------------------------ No components found with this basename: POCBNP,      Time Spent in minutes  35   SINGH,PRASHANT K M.D on 01/01/2014 at 9:54 AM  Between 7am to 7pm - Pager - [phone]  After 7pm go to [email or web address] - password [email or web address]  And look for the night coverage person covering for me after hours  [facility] Office  [phone]   **Disclaimer: This note may have been dictated with voice recognition software. Similar sounding words can inadvertently be transcribed and this note may contain transcription errors which may not have been corrected upon publication of note.**

## 2014-01-01 NOTE — Progress Notes (Signed)
Patient had a very minimal emesis from the soup broth taken during lunch, color brown similar to soup broth color.  Antiemetic given as requested by patient.

## 2014-01-02 LAB — GLUCOSE, CAPILLARY
GLUCOSE-CAPILLARY: 105 mg/dL — AB (ref 70–99)
Glucose-Capillary: 121 mg/dL — ABNORMAL HIGH (ref 70–99)
Glucose-Capillary: 167 mg/dL — ABNORMAL HIGH (ref 70–99)
Glucose-Capillary: 93 mg/dL (ref 70–99)

## 2014-01-02 LAB — URINE CULTURE
COLONY COUNT: NO GROWTH
Culture: NO GROWTH

## 2014-01-02 MED ORDER — HYDROCODONE-ACETAMINOPHEN 5-325 MG PO TABS: 1.0000 | ORAL_TABLET | Freq: Four times a day (QID) | ORAL | Status: DC | PRN

## 2014-01-02 MED ORDER — ONDANSETRON HCL 4 MG PO TABS: 4.0000 mg | ORAL_TABLET | Freq: Three times a day (TID) | ORAL | Status: DC | PRN

## 2014-01-02 NOTE — Discharge Summary (Signed)
[name], is a 33 y.o. male  DOB 03/28/1981  MRN [id number].  Admission date:  12/31/2013  Admitting Physician  No admitting provider for patient encounter.  Discharge Date:  01/02/2014   Primary MD  Debbora Presto, MD  Recommendations for primary care physician for things to follow:   Monitor clinically, repeat CBC BMP lipase next visit   Admission Diagnosis  Alcohol-induced chronic pancreatitis [577.1] Intractable cyclical vomiting with nausea [536.2]   Discharge Diagnosis  alcohol induced abdominal pain and Intractable cyclical vomiting with nausea      Active Problems:   Abdominal pain   Cystic fibrosis   Intractable vomiting   Emesis, persistent   Alcohol-induced chronic pancreatitis   Alcohol abuse   Bipolar disorder, unspecified      Past Medical History  Diagnosis Date  . Pancreatitis   . Cystic fibrosis   . Alcohol abuse   . Anxiety   . Depression     Past Surgical History  Procedure Laterality Date  . Cholecystectomy    . Nasal sinus surgery         History of present illness and  Hospital Course:     Kindly see H&P for history of present illness and admission details, please review complete Labs, Consult reports and Test reports for all details in brief  HPI  from the history and physical done on the day of admission  [name] is a 33 y.o. male past medical history significant for cystic fibrosis, chronic pancreatitis, recently diagnosed diabetes mellitus alcohol abuse who presents with above complaints. He states that he had been sober for a couple of months but started drinking intermittently again>> drank last p.m. and woke up with a severe abdominal pain and persistent vomiting-nonbloody. He denies fever, dysuria, diarrhea, melena and no hematochezia. He was seen at the [facility] and labs revealed a normal lipase, blood glucose 258 and other chemistries within normal limits.      Hospital Course     1. Epigastric abdominal pain along with nausea vomiting in a patient with history of chronic pancreatitis after an episode of alcoholic binging. CT scan abdomen pelvis and lipase are stable no evidence of acute pancreatitis, blood work and physical exam nonacute and unremarkable, now appears comfortable, tolerated liquid diet throughout night without any emesis, will gradually advance diet here and in the outpatient setting and discharged home on supportive care. Counseled to quit alcohol. Says he had one binge episode in 3 months. His GI team is at [facility].     2. History of cystic fibrosis. No acute issues, supportive care.     3. Bipolar disorder. Currently stable does not feel suicidal or homicidal. Home medications will be continued unchanged which are Wellbutrin, Remeron, Neurontin, Depakote, and Seroquel. Follow with primary psychiatrist as needed.    4. Alcoholic binge. Says one isolated episode in the last 3 months, no evidence of DTs, counseled to quit follow with PCP.     Note patient has various [facility]  [facility] and [facility], says her subspecialists everywhere. Will have him follow with them post discharge with primary care physician and GI physician at [facility] within 3-4 days.      Discharge Condition: stable   Follow UP  Follow-up Information   Follow up with Primary care physician and [facility] physician at [facility]. Schedule an appointment as soon as possible for a visit in 3 days.        Discharge Instructions  and  Discharge Medications          Discharge Instructions   Discharge instructions    Complete by:  As directed   Follow with Primary   in 3 days   Get CBC, CMP, lipase checked  by Primary MD next visit.    Activity: As tolerated with Full fall precautions use  walker/cane & assistance as needed   Disposition Home    Diet: Low-fat of modified heart healthy soft diet, advance consistency to regular over the next 3-4 days as tolerated.  For Heart failure patients - Check your Weight same time everyday, if you gain over 2 pounds, or you develop in leg swelling, experience more shortness of breath or chest pain, call your Primary MD immediately. Follow Cardiac Low Salt Diet and 1.8 lit/day fluid restriction.   On your next visit with her primary care physician please Get Medicines reviewed and adjusted.  Please request your Prim.MD to go over all Hospital Tests and Procedure/Radiological results at the follow up, please get all Hospital records sent to your Prim MD by signing hospital release before you go home.   If you experience worsening of your admission symptoms, develop shortness of breath, life threatening emergency, suicidal or homicidal thoughts you must seek medical attention immediately by calling 911 or calling your MD immediately  if symptoms less severe.  You Must read complete instructions/literature along with all the possible adverse reactions/side effects for all the Medicines you take and that have been prescribed to you. Take any new Medicines after you have completely understood and accpet all the possible adverse reactions/side effects.   Do not drive, operating heavy machinery, perform activities at heights, swimming or participation in water activities or provide baby sitting services if your were admitted for syncope or siezures until you have seen by Primary MD or a Neurologist and advised to do so again.  Do not drive when taking Pain medications.    Do not take more than prescribed Pain, Sleep and Anxiety Medications  Special Instructions: If you have smoked or chewed Tobacco  in the last 2 yrs please stop smoking, stop any regular Alcohol  and or any Recreational drug use.  Wear Seat belts while driving.   Please  note  You were cared for by a hospitalist during your hospital stay. If you have any questions about your discharge medications or the care you received while you were in the hospital after you are discharged, you can call the unit and asked to speak with the hospitalist on call if the hospitalist that took care of you is not available. Once you are discharged, your primary care physician will handle any further medical issues. Please note that NO REFILLS for any discharge medications will be authorized once you are discharged, as it is imperative that you return to your primary care physician (or establish a relationship with a primary care physician if you do not have one) for your aftercare needs so that they  can reassess your need for medications and monitor your lab values.  Follow with Primary   in 3 days   Get CBC, CMP, lipase checked  by Primary MD next visit.    Activity: As tolerated with Full fall precautions use walker/cane & assistance as needed   Disposition Home    Diet: Low-fat of modified heart healthy soft diet, advance consistency to regular over the next 3-4 days as tolerated.  For Heart failure patients - Check your Weight same time everyday, if you gain over 2 pounds, or you develop in leg swelling, experience more shortness of breath or chest pain, call your Primary MD immediately. Follow Cardiac Low Salt Diet and 1.8 lit/day fluid restriction.   On your next visit with her primary care physician please Get Medicines reviewed and adjusted.  Please request your Prim.MD to go over all Hospital Tests and Procedure/Radiological results at the follow up, please get all Hospital records sent to your Prim MD by signing hospital release before you go home.   If you experience worsening of your admission symptoms, develop shortness of breath, life threatening emergency, suicidal or homicidal thoughts you must seek medical attention immediately by calling 911 or calling your MD  immediately  if symptoms less severe.  You Must read complete instructions/literature along with all the possible adverse reactions/side effects for all the Medicines you take and that have been prescribed to you. Take any new Medicines after you have completely understood and accpet all the possible adverse reactions/side effects.   Do not drive, operating heavy machinery, perform activities at heights, swimming or participation in water activities or provide baby sitting services if your were admitted for syncope or siezures until you have seen by Primary MD or a Neurologist and advised to do so again.  Do not drive when taking Pain medications.    Do not take more than prescribed Pain, Sleep and Anxiety Medications  Special Instructions: If you have smoked or chewed Tobacco  in the last 2 yrs please stop smoking, stop any regular Alcohol  and or any Recreational drug use.  Wear Seat belts while driving.   Please note  You were cared for by a hospitalist during your hospital stay. If you have any questions about your discharge medications or the care you received while you were in the hospital after you are discharged, you can call the unit and asked to speak with the hospitalist on call if the hospitalist that took care of you is not available. Once you are discharged, your primary care physician will handle any further medical issues. Please note that NO REFILLS for any discharge medications will be authorized once you are discharged, as it is imperative that you return to your primary care physician (or establish a relationship with a primary care physician if you do not have one) for your aftercare needs so that they can reassess your need for medications and monitor your lab values.     Increase activity slowly    Complete by:  As directed             Medication List         buPROPion 150 MG 24 hr tablet  Commonly known as:  WELLBUTRIN XL  Take 1 tablet (150 mg total) by mouth  every morning.     divalproex 500 MG 24 hr tablet  Commonly known as:  DEPAKOTE ER  Take 1 tablet (500 mg total) by mouth daily.     gabapentin 600  MG tablet  Commonly known as:  NEURONTIN  Take 600 mg by mouth as needed (for pain).     HYDROcodone-acetaminophen 5-325 MG per tablet  Commonly known as:  NORCO/VICODIN  Take 1 tablet by mouth every 6 (six) hours as needed for moderate pain.     insulin aspart 100 UNIT/ML injection  Commonly known as:  novoLOG  Inject 12 Units into the skin 3 (three) times daily with meals.     mirtazapine 15 MG tablet  Commonly known as:  REMERON  Take 1 tablet (15 mg total) by mouth at bedtime.     ondansetron 4 MG tablet  Commonly known as:  ZOFRAN  Take 1 tablet (4 mg total) by mouth every 8 (eight) hours as needed for nausea or vomiting.     pantoprazole 40 MG tablet  Commonly known as:  PROTONIX  Take 1 tablet (40 mg total) by mouth daily.     QUEtiapine 100 MG tablet  Commonly known as:  SEROQUEL  Take 1 at 2 pm and 2 at 8 pm     ursodiol 300 MG capsule  Commonly known as:  ACTIGALL  Take 600-900 mg by mouth 3 (three) times daily. 900 mg AM, 900 mg afternoon, and 600 mg PM     ZENPEP 20000 UNITS Cpep  Generic drug:  Pancrelipase (Lip-Prot-Amyl)  Take 160,000 Units by mouth 3 (three) times daily with meals. Take 8 capsules with each meal          Diet and Activity recommendation: See Discharge Instructions above   Consults obtained - none   Major procedures and Radiology Reports - PLEASE review detailed and final reports for all details, in brief -       Ct Abdomen Pelvis W Contrast  01/01/2014   CLINICAL DATA:  Severe abdominal pain with vomiting today. History of cystic fibrosis and chronic pancreatitis.  EXAM: CT ABDOMEN AND PELVIS WITH CONTRAST  TECHNIQUE: Multidetector CT imaging of the abdomen and pelvis was performed using the standard protocol following bolus administration of intravenous contrast.  CONTRAST:   OMNIPAQUE IOHEXOL 300 MG/ML  SOLN  COMPARISON:  Prior CTs 10/03/2013 and 10/12/2013.  FINDINGS: Lung bases: Stable relatively mild chronic bronchiectasis within the lingula and right middle lobe. No focal airspace disease, pleural or pericardial effusion.  Liver: Stable changes of cirrhosis with asymmetric enlargement of the lateral segment of the left and caudate lobes. There is increased ill-defined low-density surrounding the portal vein most consistent with focal fat. No enhancing liver lesions identified. The portal vein is distended. 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  Gallbladder/Biliary system: Surgically absent. No evidence of biliary dilatation.  Pancreas: Diffuse fatty replacement of the pancreas. No focal inflammatory process.  Spleen: Stable mild splenomegaly without focal abnormality.  Adrenal glands: Stable 1.4 cm nodule adjacent to the right adrenal gland on image 15. Based on the reformatted images, this is probably exophytic from the liver, and appears unchanged. The left adrenal gland appears normal.  Kidneys/Ureters/Bladder: Both kidneys appear normal without evidence of mass, calculus or hydronephrosis. The bladder appears normal.  Bowel: The stomach, small bowel, appendix and colon demonstrate no significant findings.  Peritoneum: No ascites or peritoneal nodularity.  Lymph Nodes: No enlarged abdominal or pelvic lymph nodes. Small mesenteric nodes appear unchanged.  Vascular Structures: No significant vascular findings.  Reproductive Organs: Unremarkable.  Abdominal wall: No abdominal wall masses or hernias.  Musculoskeletal: No acute or significant osseus findings.  IMPRESSION: 1. No acute abdominal pelvic findings. 2. Cirrhosis  with focal fat surrounding the portal vein and stable changes of portal hypertension. 3. Stable fatty replacement of the pancreas.   Electronically Signed   By: Roxy Horseman M.D.   On: 01/01/2014 00:21    Micro Results      Recent Results (from the past 240  hour(s))  URINE CULTURE     Status: None   Collection Time    12/31/13  9:40 PM      Result Value Ref Range Status   Specimen Description URINE, CLEAN CATCH   Final   Special Requests NONE   Final   Culture  Setup Time     Final   Value: 12/31/2013 21:57     Performed at [facility] Count     Final   Value: NO GROWTH     Performed at [facility]   Culture     Final   Value: NO GROWTH     Performed at [facility]   Report Status 01/02/2014 FINAL   Final       Today   Subjective:   [name] today has no headache,no chest abdominal pain,no new weakness tingling or numbness, feels much better wants to go home today.    Objective:   Blood pressure 139/91, pulse 73, temperature 97.7 F (36.5 C), temperature source Oral, resp. rate 20, height 6' (1.829 m), weight 89.449 kg (197 lb 3.2 oz), SpO2 94.00%.   Intake/Output Summary (Last 24 hours) at 01/02/14 0942 Last data filed at 01/02/14 0700  Gross per 24 hour  Intake   2810 ml  Output   2440 ml  Net    370 ml    Exam Awake Alert, laying in bed comfortably him a Oriented x 3, No new F.N deficits, Normal affect [place].AT,PERRAL Supple Neck,No JVD, No cervical lymphadenopathy appriciated.  Symmetrical Chest wall movement, Good air movement bilaterally, CTAB RRR,No Gallops,Rubs or new Murmurs, No Parasternal Heave +ve B.Sounds, Abd Soft, Non tender, No organomegaly appriciated, No rebound -guarding or rigidity. No Cyanosis, Clubbing or edema, No new Rash or bruise  Data Review   CBC w Diff: Lab Results  Component Value Date   WBC 5.5 01/01/2014   HGB 13.5 01/01/2014   HCT 37.4* 01/01/2014   PLT 101* 01/01/2014   LYMPHOPCT 24 12/31/2013   MONOPCT 6 12/31/2013   EOSPCT 1 12/31/2013   BASOPCT 0 12/31/2013    CMP: Lab Results  Component Value Date   NA 143 01/01/2014   K 4.3 01/01/2014   CL 105 01/01/2014   CO2 25 01/01/2014   BUN 14 01/01/2014   CREATININE 0.90 01/01/2014   PROT 6.3  01/01/2014   ALBUMIN 3.4* 01/01/2014   BILITOT 0.7 01/01/2014   ALKPHOS 103 01/01/2014   AST 33 01/01/2014   ALT 16 01/01/2014  .   Total Time in preparing paper work, data evaluation and todays exam - 35 minutes  Leroy Sea M.D on 01/02/2014 at 9:42 AM  [facility] Office  [phone]   **Disclaimer: This note may have been dictated with voice recognition software. Similar sounding words can inadvertently be transcribed and this note may contain transcription errors which may not have been corrected upon publication of note.**

## 2014-01-02 NOTE — Discharge Instructions (Signed)
Follow with Primary   in 3 days   Get CBC, CMP, lipase checked  by Primary MD next visit.    Activity: As tolerated with Full fall precautions use walker/cane & assistance as needed   Disposition Home    Diet: Low-fat of modified heart healthy soft diet, advance consistency to regular over the next 3-4 days as tolerated.  For Heart failure patients - Check your Weight same time everyday, if you gain over 2 pounds, or you develop in leg swelling, experience more shortness of breath or chest pain, call your Primary MD immediately. Follow Cardiac Low Salt Diet and 1.8 lit/day fluid restriction.   On your next visit with her primary care physician please Get Medicines reviewed and adjusted.  Please request your Prim.MD to go over all Hospital Tests and Procedure/Radiological results at the follow up, please get all Hospital records sent to your Prim MD by signing hospital release before you go home.   If you experience worsening of your admission symptoms, develop shortness of breath, life threatening emergency, suicidal or homicidal thoughts you must seek medical attention immediately by calling 911 or calling your MD immediately  if symptoms less severe.  You Must read complete instructions/literature along with all the possible adverse reactions/side effects for all the Medicines you take and that have been prescribed to you. Take any new Medicines after you have completely understood and accpet all the possible adverse reactions/side effects.   Do not drive, operating heavy machinery, perform activities at heights, swimming or participation in water activities or provide baby sitting services if your were admitted for syncope or siezures until you have seen by Primary MD or a Neurologist and advised to do so again.  Do not drive when taking Pain medications.    Do not take more than prescribed Pain, Sleep and Anxiety Medications  Special Instructions: If you have smoked or chewed  Tobacco  in the last 2 yrs please stop smoking, stop any regular Alcohol  and or any Recreational drug use.  Wear Seat belts while driving.   Please note  You were cared for by a hospitalist during your hospital stay. If you have any questions about your discharge medications or the care you received while you were in the hospital after you are discharged, you can call the unit and asked to speak with the hospitalist on call if the hospitalist that took care of you is not available. Once you are discharged, your primary care physician will handle any further medical issues. Please note that NO REFILLS for any discharge medications will be authorized once you are discharged, as it is imperative that you return to your primary care physician (or establish a relationship with a primary care physician if you do not have one) for your aftercare needs so that they can reassess your need for medications and monitor your lab values.

## 2014-01-06 NOTE — ED Provider Notes (Signed)
Medical screening examination/treatment/procedure(s) were performed by non-physician practitioner and as supervising physician I was immediately available for consultation/collaboration.   EKG Interpretation None       Analiese Krupka M Henryk Ursin, MD 01/06/14 0052 

## 2014-01-18 ENCOUNTER — Inpatient Hospital Stay: Payer: Self-pay | Admitting: Internal Medicine

## 2014-01-18 LAB — CBC WITH DIFFERENTIAL/PLATELET
BASOS ABS: 0.1 10*3/uL (ref 0.0–0.1)
BASOS PCT: 0.5 %
Eosinophil #: 0.1 10*3/uL (ref 0.0–0.7)
Eosinophil %: 0.6 %
HCT: 46 % (ref 40.0–52.0)
HGB: 16.1 g/dL (ref 13.0–18.0)
LYMPHS PCT: 16.2 %
Lymphocyte #: 1.8 10*3/uL (ref 1.0–3.6)
MCH: 30.8 pg (ref 26.0–34.0)
MCHC: 34.9 g/dL (ref 32.0–36.0)
MCV: 88 fL (ref 80–100)
MONOS PCT: 6.9 %
Monocyte #: 0.8 x10 3/mm (ref 0.2–1.0)
NEUTROS PCT: 75.8 %
Neutrophil #: 8.4 10*3/uL — ABNORMAL HIGH (ref 1.4–6.5)
PLATELETS: 210 10*3/uL (ref 150–440)
RBC: 5.21 10*6/uL (ref 4.40–5.90)
RDW: 15.6 % — ABNORMAL HIGH (ref 11.5–14.5)
WBC: 11.1 10*3/uL — ABNORMAL HIGH (ref 3.8–10.6)

## 2014-01-18 LAB — COMPREHENSIVE METABOLIC PANEL
ALBUMIN: 4.3 g/dL (ref 3.4–5.0)
ALK PHOS: 164 U/L — AB
AST: 29 U/L (ref 15–37)
Anion Gap: 12 (ref 7–16)
BUN: 10 mg/dL (ref 7–18)
Bilirubin,Total: 1.5 mg/dL — ABNORMAL HIGH (ref 0.2–1.0)
CALCIUM: 9.2 mg/dL (ref 8.5–10.1)
CO2: 22 mmol/L (ref 21–32)
Chloride: 102 mmol/L (ref 98–107)
Creatinine: 1.12 mg/dL (ref 0.60–1.30)
EGFR (African American): >60
Glucose: 218 mg/dL — ABNORMAL HIGH (ref 65–99)
Osmolality: 278 (ref 275–301)
Potassium: 4.1 mmol/L (ref 3.5–5.1)
SGPT (ALT): 30 U/L
Sodium: 136 mmol/L (ref 136–145)
Total Protein: 8.5 g/dL — ABNORMAL HIGH (ref 6.4–8.2)

## 2014-01-18 LAB — LIPASE, BLOOD: Lipase: 35 U/L — ABNORMAL LOW (ref 73–393)

## 2014-01-19 ENCOUNTER — Ambulatory Visit (HOSPITAL_COMMUNITY): Payer: Self-pay | Admitting: Psychiatry

## 2014-01-19 LAB — URINALYSIS, COMPLETE
BLOOD: NEGATIVE
Bilirubin,UR: NEGATIVE
GLUCOSE, UR: NEGATIVE mg/dL (ref 0–75)
Ketone: NEGATIVE
LEUKOCYTE ESTERASE: NEGATIVE
Nitrite: NEGATIVE
Ph: 6 (ref 4.5–8.0)
Specific Gravity: 1.027 (ref 1.003–1.030)
Squamous Epithelial: <1
WBC UR: 1 /HPF (ref 0–5)

## 2014-01-20 LAB — BASIC METABOLIC PANEL
Anion Gap: 7 (ref 7–16)
BUN: 9 mg/dL (ref 7–18)
CALCIUM: 8.3 mg/dL — AB (ref 8.5–10.1)
CO2: 24 mmol/L (ref 21–32)
Chloride: 107 mmol/L (ref 98–107)
Creatinine: 0.96 mg/dL (ref 0.60–1.30)
EGFR (African American): >60
GLUCOSE: 134 mg/dL — AB (ref 65–99)
Osmolality: 276 (ref 275–301)
POTASSIUM: 3.6 mmol/L (ref 3.5–5.1)
SODIUM: 138 mmol/L (ref 136–145)

## 2014-01-20 LAB — LIPASE, BLOOD: LIPASE: 25 U/L — AB (ref 73–393)

## 2014-01-22 LAB — COMPREHENSIVE METABOLIC PANEL
ALK PHOS: 107 U/L
ALT: 21 U/L
ANION GAP: 10 (ref 7–16)
AST: 24 U/L (ref 15–37)
Albumin: 3 g/dL — ABNORMAL LOW (ref 3.4–5.0)
BILIRUBIN TOTAL: 0.9 mg/dL (ref 0.2–1.0)
BUN: 4 mg/dL — ABNORMAL LOW (ref 7–18)
CALCIUM: 7.8 mg/dL — AB (ref 8.5–10.1)
CHLORIDE: 106 mmol/L (ref 98–107)
CO2: 25 mmol/L (ref 21–32)
Creatinine: 0.95 mg/dL (ref 0.60–1.30)
EGFR (African American): >60
EGFR (Non-African Amer.): >60
Glucose: 109 mg/dL — ABNORMAL HIGH (ref 65–99)
Osmolality: 279 (ref 275–301)
Potassium: 3.4 mmol/L — ABNORMAL LOW (ref 3.5–5.1)
SODIUM: 141 mmol/L (ref 136–145)
TOTAL PROTEIN: 6.2 g/dL — AB (ref 6.4–8.2)

## 2014-01-24 LAB — COMPREHENSIVE METABOLIC PANEL
Albumin: 3.2 g/dL — ABNORMAL LOW (ref 3.4–5.0)
Alkaline Phosphatase: 100 U/L
Anion Gap: 13 (ref 7–16)
BUN: 5 mg/dL — ABNORMAL LOW (ref 7–18)
Bilirubin,Total: 0.9 mg/dL (ref 0.2–1.0)
Calcium, Total: 7.7 mg/dL — ABNORMAL LOW (ref 8.5–10.1)
Chloride: 107 mmol/L (ref 98–107)
Co2: 21 mmol/L (ref 21–32)
Creatinine: 0.86 mg/dL (ref 0.60–1.30)
EGFR (African American): >60
EGFR (Non-African Amer.): >60
Glucose: 68 mg/dL (ref 65–99)
Osmolality: 277 (ref 275–301)
Potassium: 3.6 mmol/L (ref 3.5–5.1)
SGOT(AST): 20 U/L (ref 15–37)
SGPT (ALT): 23 U/L
Sodium: 141 mmol/L (ref 136–145)
Total Protein: 6.6 g/dL (ref 6.4–8.2)

## 2014-01-24 LAB — CBC WITH DIFFERENTIAL/PLATELET
BASOS ABS: 0.1 10*3/uL (ref 0.0–0.1)
BASOS PCT: 0.8 %
EOS PCT: 3.4 %
Eosinophil #: 0.2 10*3/uL (ref 0.0–0.7)
HCT: 35.9 % — AB (ref 40.0–52.0)
HGB: 12.6 g/dL — ABNORMAL LOW (ref 13.0–18.0)
LYMPHS ABS: 2.1 10*3/uL (ref 1.0–3.6)
LYMPHS PCT: 33.8 %
MCH: 31 pg (ref 26.0–34.0)
MCHC: 35 g/dL (ref 32.0–36.0)
MCV: 89 fL (ref 80–100)
MONOS PCT: 5.4 %
Monocyte #: 0.3 x10 3/mm (ref 0.2–1.0)
NEUTROS PCT: 56.6 %
Neutrophil #: 3.5 10*3/uL (ref 1.4–6.5)
Platelet: 131 10*3/uL — ABNORMAL LOW (ref 150–440)
RBC: 4.05 10*6/uL — ABNORMAL LOW (ref 4.40–5.90)
RDW: 15.7 % — AB (ref 11.5–14.5)
WBC: 6.1 10*3/uL (ref 3.8–10.6)

## 2014-01-24 LAB — AMYLASE: Amylase: 31 U/L (ref 25–115)

## 2014-01-24 LAB — LIPASE, BLOOD: Lipase: 38 U/L — ABNORMAL LOW (ref 73–393)

## 2014-01-26 LAB — BASIC METABOLIC PANEL
Anion Gap: 16 (ref 7–16)
BUN: 7 mg/dL (ref 7–18)
Calcium, Total: 8.7 mg/dL (ref 8.5–10.1)
Chloride: 104 mmol/L (ref 98–107)
Co2: 17 mmol/L — ABNORMAL LOW (ref 21–32)
Creatinine: 1.06 mg/dL (ref 0.60–1.30)
EGFR (African American): >60
EGFR (Non-African Amer.): >60
Glucose: 281 mg/dL — ABNORMAL HIGH (ref 65–99)
Osmolality: 282 (ref 275–301)
Potassium: 4.5 mmol/L (ref 3.5–5.1)
Sodium: 137 mmol/L (ref 136–145)

## 2014-01-26 LAB — PHOSPHORUS: Phosphorus: 2.5 mg/dL (ref 2.5–4.9)

## 2014-01-26 LAB — MAGNESIUM: Magnesium: 1.8 mg/dL

## 2014-03-06 ENCOUNTER — Emergency Department (HOSPITAL_COMMUNITY)
Admission: EM | Admit: 2014-03-06 | Discharge: 2014-03-06 | Disposition: A | Discharge status: Home / Self Care | Payer: BC Managed Care – PPO | Attending: Emergency Medicine | Admitting: Emergency Medicine

## 2014-03-06 ENCOUNTER — Encounter (HOSPITAL_COMMUNITY): Payer: Self-pay | Admitting: Emergency Medicine

## 2014-03-06 DIAGNOSIS — Z8719 Personal history of other diseases of the digestive system: Secondary | ICD-10-CM | POA: Diagnosis not present

## 2014-03-06 DIAGNOSIS — G8929 Other chronic pain: Secondary | ICD-10-CM | POA: Diagnosis not present

## 2014-03-06 DIAGNOSIS — R109 Unspecified abdominal pain: Secondary | ICD-10-CM | POA: Diagnosis not present

## 2014-03-06 DIAGNOSIS — Z862 Personal history of diseases of the blood and blood-forming organs and certain disorders involving the immune mechanism: Secondary | ICD-10-CM | POA: Insufficient documentation

## 2014-03-06 DIAGNOSIS — Z794 Long term (current) use of insulin: Secondary | ICD-10-CM | POA: Insufficient documentation

## 2014-03-06 DIAGNOSIS — F329 Major depressive disorder, single episode, unspecified: Secondary | ICD-10-CM | POA: Insufficient documentation

## 2014-03-06 DIAGNOSIS — F19939 Other psychoactive substance use, unspecified with withdrawal, unspecified: Secondary | ICD-10-CM | POA: Insufficient documentation

## 2014-03-06 DIAGNOSIS — Z79899 Other long term (current) drug therapy: Secondary | ICD-10-CM | POA: Diagnosis not present

## 2014-03-06 DIAGNOSIS — F411 Generalized anxiety disorder: Secondary | ICD-10-CM | POA: Insufficient documentation

## 2014-03-06 DIAGNOSIS — Z8639 Personal history of other endocrine, nutritional and metabolic disease: Secondary | ICD-10-CM | POA: Insufficient documentation

## 2014-03-06 DIAGNOSIS — F3289 Other specified depressive episodes: Secondary | ICD-10-CM | POA: Insufficient documentation

## 2014-03-06 LAB — CBC WITH DIFFERENTIAL/PLATELET
Basophils Absolute: 0 10*3/uL (ref 0.0–0.1)
Basophils Relative: 0 % (ref 0–1)
Eosinophils Absolute: 0.1 10*3/uL (ref 0.0–0.7)
Eosinophils Relative: 1 % (ref 0–5)
HCT: 45.4 % (ref 39.0–52.0)
Hemoglobin: 16.5 g/dL (ref 13.0–17.0)
LYMPHS PCT: 14 % (ref 12–46)
Lymphs Abs: 1.5 10*3/uL (ref 0.7–4.0)
MCH: 31.5 pg (ref 26.0–34.0)
MCHC: 36.3 g/dL — ABNORMAL HIGH (ref 30.0–36.0)
MCV: 86.8 fL (ref 78.0–100.0)
Monocytes Absolute: 0.6 10*3/uL (ref 0.1–1.0)
Monocytes Relative: 5 % (ref 3–12)
NEUTROS ABS: 8.4 10*3/uL — AB (ref 1.7–7.7)
NEUTROS PCT: 80 % — AB (ref 43–77)
PLATELETS: 207 10*3/uL (ref 150–400)
RBC: 5.23 MIL/uL (ref 4.22–5.81)
RDW: 12.4 % (ref 11.5–15.5)
WBC: 10.5 10*3/uL (ref 4.0–10.5)

## 2014-03-06 LAB — COMPREHENSIVE METABOLIC PANEL
ALK PHOS: 205 U/L — AB (ref 39–117)
ALT: 28 U/L (ref 0–53)
AST: 19 U/L (ref 0–37)
Albumin: 5 g/dL (ref 3.5–5.2)
Anion gap: 17 — ABNORMAL HIGH (ref 5–15)
BILIRUBIN TOTAL: 0.8 mg/dL (ref 0.3–1.2)
BUN: 16 mg/dL (ref 6–23)
CALCIUM: 10.3 mg/dL (ref 8.4–10.5)
CHLORIDE: 95 meq/L — AB (ref 96–112)
CO2: 22 meq/L (ref 19–32)
Creatinine, Ser: 1.03 mg/dL (ref 0.50–1.35)
GFR calc Af Amer: >90 mL/min (ref >90)
Glucose, Bld: 179 mg/dL — ABNORMAL HIGH (ref 70–99)
Potassium: 5.2 mEq/L (ref 3.7–5.3)
SODIUM: 134 meq/L — AB (ref 137–147)
Total Protein: 8.7 g/dL — ABNORMAL HIGH (ref 6.0–8.3)

## 2014-03-06 LAB — LIPASE, BLOOD: LIPASE: 6 U/L — AB (ref 11–59)

## 2014-03-06 MED ORDER — SODIUM CHLORIDE 0.9 % IV SOLN: 1000.0000 mL | INTRAVENOUS | Status: DC

## 2014-03-06 MED ORDER — ONDANSETRON 8 MG PO TBDP: 8.0000 mg | ORAL_TABLET | Freq: Three times a day (TID) | ORAL | Status: DC | PRN

## 2014-03-06 MED ORDER — KETOROLAC TROMETHAMINE 30 MG/ML IJ SOLN
30.0000 mg | Freq: Once | INTRAMUSCULAR | Status: AC
  Administered 2014-03-06: 30 mg via INTRAVENOUS
  Filled 2014-03-06: qty 1

## 2014-03-06 MED ORDER — SODIUM CHLORIDE 0.9 % IV SOLN
1000.0000 mL | Freq: Once | INTRAVENOUS | Status: AC
  Administered 2014-03-06: 1000 mL via INTRAVENOUS

## 2014-03-06 MED ORDER — ONDANSETRON HCL 4 MG/2ML IJ SOLN
4.0000 mg | Freq: Once | INTRAMUSCULAR | Status: AC
  Administered 2014-03-06: 4 mg via INTRAVENOUS
  Filled 2014-03-06: qty 2

## 2014-03-06 MED ORDER — DICYCLOMINE HCL 20 MG PO TABS: 20.0000 mg | ORAL_TABLET | Freq: Two times a day (BID) | ORAL | Status: DC

## 2014-03-06 MED ORDER — CLONIDINE HCL 0.1 MG PO TABS: 0.1000 mg | ORAL_TABLET | Freq: Three times a day (TID) | ORAL | Status: DC | PRN

## 2014-03-06 MED ORDER — LORAZEPAM 2 MG/ML IJ SOLN
1.0000 mg | Freq: Once | INTRAMUSCULAR | Status: AC
Start: 2014-03-06 — End: 2014-03-06
  Administered 2014-03-06: 1 mg via INTRAVENOUS
  Filled 2014-03-06: qty 1

## 2014-03-06 NOTE — ED Provider Notes (Signed)
CSN: [id number]     Arrival date & time 03/06/14  1304 History   First MD Initiated Contact with Patient 03/06/14 1422     Chief Complaint  Patient presents with  . withdrawl from opiates      HPI Patient reports long-standing history of chronic abdominal pain.  He states his had pancreatitis before in the past as well.  He's been seen in pain clinics in [facility] without any relief in his symptoms.  It is noted in the care everywhere record that at [facility] pain clinic it was not thought this was best treated with narcotic medications.  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  He does admit to cocaine use last night.  He presents today complaining of upper abdominal cramping and pain as well as nausea vomiting concerning him for either his recurrent pancreatitis or opioid withdrawal.  He was just discharged from [facility] yesterday where he was receiving narcotic medications for suspected chronic pancreatitis in the hospital.  He's now been without narcotic medications for the past 24 hours.  He was not discharged home with a chronic pain medicine or opioid medicine because of their concerns for drug seeking behavior.  Today he states he is seeking pain medicine to help his upper abdominal pain but that he would like to be admitted to behavioral health hospital for opioid withdrawal because he like to get off all narcotics.  He has no suicidal or homicidal plans.  He denies hematemesis.  No diarrhea.  No fevers or chills.  Denies urinary complaints.  No chest pain or shortness of breath.   Past Medical History  Diagnosis Date  . Pancreatitis   . Cystic fibrosis   . Alcohol abuse   . Anxiety   . Depression    Past Surgical History  Procedure Laterality Date  . Cholecystectomy    . Nasal sinus surgery     Family History  Problem Relation Age of Onset  . Diabetes Mellitus II Other   . Colon cancer Other    History  Substance Use Topics  . Smoking  status: Never Smoker   . Smokeless tobacco: Never Used  . Alcohol Use: Yes     Comment: heavy daily    Review of Systems  All other systems reviewed and are negative.     Allergies  Review of patient's allergies indicates no known allergies.  Home Medications   Prior to Admission medications   Medication Sig Start Date End Date Taking? Authorizing Provider  divalproex (DEPAKOTE ER) 500 MG 24 hr tablet Take 1 tablet (500 mg total) by mouth daily. 12/19/13  Yes Cleotis Nipper, MD  mirtazapine (REMERON) 15 MG tablet Take 1 tablet (15 mg total) by mouth at bedtime. 12/19/13  Yes Cleotis Nipper, MD  Pancrelipase, Lip-Prot-Amyl, (ZENPEP) 20000 UNITS CPEP Take 160,000 Units by mouth 3 (three) times daily with meals. Take 8 capsules with each meal   Yes Historical Provider, MD  pantoprazole (PROTONIX) 40 MG tablet Take 1 tablet (40 mg total) by mouth daily. 10/19/13  Yes Verne Spurr, PA-C  QUEtiapine (SEROQUEL) 100 MG tablet Take 1 at 2 pm and 2 at 8 pm 12/19/13  Yes Cleotis Nipper, MD  ursodiol (ACTIGALL) 300 MG capsule Take 600-900 mg by mouth 3 (three) times daily. 900 mg AM, 900 mg afternoon, and 600 mg PM 11/08/13  Yes Historical Provider, MD  cloNIDine (CATAPRES) 0.1 MG tablet Take 1 tablet (0.1 mg  total) by mouth every 8 (eight) hours as needed. 03/06/14   Lyanne Co, MD  dicyclomine (BENTYL) 20 MG tablet Take 1 tablet (20 mg total) by mouth 2 (two) times daily. 03/06/14   Lyanne Co, MD  gabapentin (NEURONTIN) 600 MG tablet Take 600 mg by mouth as needed (for pain).     Historical Provider, MD  insulin aspart (NOVOLOG) 100 UNIT/ML injection Inject 12 Units into the skin 3 (three) times daily with meals.     Historical Provider, MD  ondansetron (ZOFRAN ODT) 8 MG disintegrating tablet Take 1 tablet (8 mg total) by mouth every 8 (eight) hours as needed for nausea or vomiting. 03/06/14   Lyanne Co, MD   BP 136/86  Pulse 78  Temp(Src) 98.6 F (37 C) (Oral)  Resp 16  SpO2 96% Physical  Exam  Nursing note and vitals reviewed. Constitutional: He is oriented to person, place, and time. He appears well-developed and well-nourished.  HENT:  Head: Normocephalic and atraumatic.  Eyes: EOM are normal.  Neck: Normal range of motion.  Cardiovascular: Normal rate, regular rhythm, normal heart sounds and intact distal pulses.   Pulmonary/Chest: Effort normal and breath sounds normal. No respiratory distress.  Abdominal: Soft. He exhibits no distension. There is no tenderness.  Musculoskeletal: Normal range of motion.  Neurological: He is alert and oriented to person, place, and time.  Skin: Skin is warm and dry.  Psychiatric: He has a normal mood and affect. Judgment normal.    ED Course  Procedures (including critical care time) Labs Review Labs Reviewed  LIPASE, BLOOD - Abnormal; Notable for the following:    Lipase 6 (*)    All other components within normal limits  CBC WITH DIFFERENTIAL - Abnormal; Notable for the following:    MCHC 36.3 (*)    Neutrophils Relative % 80 (*)    Neutro Abs 8.4 (*)    All other components within normal limits  COMPREHENSIVE METABOLIC PANEL - Abnormal; Notable for the following:    Sodium 134 (*)    Chloride 95 (*)    Glucose, Bld 179 (*)    Total Protein 8.7 (*)    Alkaline Phosphatase 205 (*)    Anion gap 17 (*)    All other components within normal limits    Imaging Review No results found.   EKG Interpretation None      MDM   Final diagnoses:  Chronic abdominal pain    No significant abdominal tenderness on examination.  I suspect the majority of this is opioid withdrawal.  He looks more comfortable after Zofran, fluids, Toradol, Ativan.  Patient be discharged home on Bentyl, Zofran, clonidine.  His labs are without significant abnormality.  His lipase is 6.  Outpatient followup.  He understands to return to the ER for new or worsening symptoms  Medications  0.9 %  sodium chloride infusion (1,000 mLs Intravenous New  Bag/Given 03/06/14 1534)    Followed by  0.9 %  sodium chloride infusion (not administered)  ondansetron (ZOFRAN) injection 4 mg (4 mg Intravenous Given 03/06/14 1539)  LORazepam (ATIVAN) injection 1 mg (1 mg Intravenous Given 03/06/14 1537)  ketorolac (TORADOL) 30 MG/ML injection 30 mg (30 mg Intravenous Given 03/06/14 1607)       Lyanne Co, MD 03/06/14 1616

## 2014-03-06 NOTE — ED Notes (Signed)
Bed: [facility] Expected date:  Expected time:  Means of arrival:  Comments: 

## 2014-03-06 NOTE — ED Notes (Signed)
Per patient, states he was hospitalized for pancreatitis-states he was not discharged on any pain meds-states he was using opiates prior to being hospitalized-states he is in pain from withdrawal and will hurt himself if he does not get any relief

## 2014-03-12 ENCOUNTER — Encounter (HOSPITAL_COMMUNITY): Payer: Self-pay | Admitting: Psychiatry

## 2014-03-12 ENCOUNTER — Ambulatory Visit (INDEPENDENT_AMBULATORY_CARE_PROVIDER_SITE_OTHER): Payer: BC Managed Care – PPO | Admitting: Psychiatry

## 2014-03-12 VITALS — BP 131/94 | HR 123 | Ht 72.0 in | Wt 165.0 lb

## 2014-03-12 DIAGNOSIS — F1010 Alcohol abuse, uncomplicated: Secondary | ICD-10-CM

## 2014-03-12 DIAGNOSIS — F319 Bipolar disorder, unspecified: Secondary | ICD-10-CM

## 2014-03-12 MED ORDER — DIVALPROEX SODIUM ER 500 MG PO TB24: 500.0000 mg | ORAL_TABLET | Freq: Every day | ORAL | Status: DC

## 2014-03-12 MED ORDER — QUETIAPINE FUMARATE 100 MG PO TABS: ORAL_TABLET | ORAL | Status: DC
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
## 2014-04-04 ENCOUNTER — Other Ambulatory Visit (HOSPITAL_COMMUNITY): Payer: Self-pay | Admitting: Psychiatry

## 2014-04-10 ENCOUNTER — Ambulatory Visit (INDEPENDENT_AMBULATORY_CARE_PROVIDER_SITE_OTHER): Payer: BC Managed Care – PPO | Admitting: Psychiatry

## 2014-04-10 ENCOUNTER — Encounter (HOSPITAL_COMMUNITY): Payer: Self-pay | Admitting: Psychiatry

## 2014-04-10 VITALS — BP 125/88 | HR 102 | Ht 72.0 in | Wt 179.0 lb

## 2014-04-10 DIAGNOSIS — F1010 Alcohol abuse, uncomplicated: Secondary | ICD-10-CM

## 2014-04-10 DIAGNOSIS — F319 Bipolar disorder, unspecified: Secondary | ICD-10-CM

## 2014-04-10 MED ORDER — DIVALPROEX SODIUM ER 500 MG PO TB24: 1000.0000 mg | ORAL_TABLET | Freq: Every day | ORAL | Status: DC

## 2014-04-10 MED ORDER — QUETIAPINE FUMARATE 100 MG PO TABS: ORAL_TABLET | ORAL | Status: DC
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
## 2014-04-18 ENCOUNTER — Other Ambulatory Visit (HOSPITAL_COMMUNITY): Payer: Self-pay | Admitting: Physician Assistant

## 2014-04-27 ENCOUNTER — Telehealth (HOSPITAL_COMMUNITY): Payer: Self-pay | Admitting: *Deleted

## 2014-04-27 NOTE — Telephone Encounter (Signed)
Refill request for Hydroxyzine sent from [facility]. Called to discuss medication. Last filled May 2015, patient is requesting a refill. Not current in medication list. Explained to pharmacist that patient has appointment 05/04/14 and it can be discussed during visit if medication is to be re-started. Also noted that patient is welcome to call if there are issues/concerns that need to be addressed.

## 2014-05-04 ENCOUNTER — Encounter (HOSPITAL_COMMUNITY): Payer: Self-pay | Admitting: Psychiatry

## 2014-05-04 ENCOUNTER — Ambulatory Visit (INDEPENDENT_AMBULATORY_CARE_PROVIDER_SITE_OTHER): Payer: BC Managed Care – PPO | Admitting: Psychiatry

## 2014-05-04 VITALS — BP 120/79 | HR 101 | Ht 72.0 in | Wt 174.8 lb

## 2014-05-04 DIAGNOSIS — F1019 Alcohol abuse with unspecified alcohol-induced disorder: Secondary | ICD-10-CM

## 2014-05-04 DIAGNOSIS — F319 Bipolar disorder, unspecified: Secondary | ICD-10-CM

## 2014-05-04 MED ORDER — QUETIAPINE FUMARATE 100 MG PO TABS: ORAL_TABLET | ORAL | Status: DC

## 2014-05-04 MED ORDER — DIVALPROEX SODIUM ER 500 MG PO TB24: 1000.0000 mg | ORAL_TABLET | Freq: Every day | ORAL | Status: DC

## 2014-05-04 MED ORDER — HYDROXYZINE PAMOATE 25 MG PO CAPS: 25.0000 mg | ORAL_CAPSULE | Freq: Two times a day (BID) | ORAL | Status: DC | PRN
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
## 2014-05-22 ENCOUNTER — Other Ambulatory Visit (HOSPITAL_COMMUNITY): Payer: Self-pay | Admitting: Psychiatry

## 2014-06-01 ENCOUNTER — Telehealth (HOSPITAL_COMMUNITY): Payer: Self-pay | Admitting: *Deleted

## 2014-06-01 NOTE — Telephone Encounter (Signed)
Father called asking if Seroquel can cause excessive sleeping. States his son has left home again but before he left his Seroquel had been increased and he was sleeping a lot. Pt was also using THC and Cocaine (per father) so he is not sure if the medication caused the sleeping or the drug use. States he will call back the first of year to talk with Dr. Lolly MustacheArfeen. No further action required.

## 2014-07-05 ENCOUNTER — Ambulatory Visit (HOSPITAL_COMMUNITY): Payer: Self-pay | Admitting: Psychiatry

## 2014-10-06 NOTE — Consult Note (Signed)
PATIENT NAMEKHALIN, ROYCE MR#:  [id number] DATE OF BIRTH:  [birth year]  DATE OF CONSULTATION:  01/23/2014  REFERRING PHYSICIAN:   CONSULTING PHYSICIAN:  Keturah Barre, NP  CONSULT ORDERED BY: Dr. Sherryll Burger for evaluation of pancreatitis and abdominal pain.   HISTORY OF PRESENT ILLNESS: I appreciate consult for this 34 year old Caucasian man with history of CF, followed by Dr. Lurena Nida at [facility], chronic pancreatitis and bipolar disorder followed by Dr. Lolly Mustache and history of liver cirrhosis he attributes to heavy alcohol use in the past, for evaluation of possible acute on chronic pancreatitis and abdominal pain. Originally, he was admitted January 18, 2014 for abdominal pain, nausea, vomiting triggered by food, got better, but restarted over the last day. He was admitted last week at [facility] for the same. He states he was treated with Dilaudid and Phenergan and got better. He states no antibiotics during treatment. In discussion with the patient, he admits to relapsing with some alcohol a few weeks ago prior to symptoms starting but has had no further EtOH since. Denies illicits. States he really does not have chronic abdominal pain at home, but has been hospitalized 3-4 times over the last 6 months for same complaints as now. He has no pain meds at home, takes Zenpep 8 capsules with meals. Does not follow with anyone besides his cystic fibrosis provider for his pancreatitis, does not follow with anyone for his cirrhosis. Says he had EGD at [facility] recently with gastritis, duodenitis, but not taking PPI at home. Reports that his abdominal pain is generalized, but primarily at the umbilicus, food hurts, pain medications relieve. No vomiting today. Some nausea. Denies hematemesis and NSAIDs. Denies melena, hematochezia. Reports one loose yellow stool per day. No complaints of current weight loss. States he had a flexible sigmoidoscopy by Dr. Julieta Gutting at a [facility] satellite clinic in [place]  with negative findings a few years ago. No other GI complaints. 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 No intrahepatic or extrahepatic ductal dilation, no evidence of bowel obstruction. Normal appendix. Lipase has been low on this admission. Liver enzymes normal today. Do note, albumin of 3, hemoglobin normal, white blood count minimally elevated at 11.1.   PAST MEDICAL HISTORY: CF, chronic pancreatitis, bipolar disorder.  PAST SURGICAL HISTORY: No surgeries.   ALLERGIES: NKDA.  HOME MEDICATIONS: Pancreatic enzymes 8 capsules t.i.d.; Ursodiol, reports 250 mg 3 capsules in the morning, 3 capsules in the afternoon, 2 capsules in the evening, this is an unusual dose; Protonix 40 mg daily, although states he is really not taking this; Remeron 20 mg at bedtime; Seroquel 100 mg in the morning, 200 mg in the evening; Depakote 500 mg at bedtime.   SOCIAL HISTORY: No history of EtOH. History of heavy alcohol use. Denies illicits. Lives with a friend in [place].   FAMILY HISTORY: Significant for diabetes.   REVIEW OF SYSTEMS: Ten systems reviewed. Significant for generalized weakness, otherwise negative other than what is written above.   LABORATORY DATA: Most recent labs: Glucose 109, BUN 4, creatinine 0.95, sodium 141, potassium 3.4, CO2 of 25, GFR greater than 60, calcium 7.8, lipase 25, total protein 6.2, serum albumin 3.0, total bilirubin 0.9, ALP 109, AST 24, ALT 21. WBC 11.1, hemoglobin 16.1, hematocrit 46, platelets 210, red cells normocytic with increased RDW. CT with findings as above. Ultrasound with a heterogenous echotexture of  liver, lobular liver is consistent with cirrhosis, no focal mass.   PHYSICAL EXAMINATION:  VITAL SIGNS: Most recent: Temperature 98.7, pulse 70, respiratory rate 18, blood pressure 147/92, SaO2 is  96%.  GENERAL: Well-appearing young man lying in bed in no acute distress.  HEENT: Normocephalic, atraumatic. Sclerae clear, conjunctivae pink.  NECK: Supple. No lymphadenopathy or JVD.  CHEST: Respirations eupneic. Lungs clear.  CARDIAC: S1, S2, RRR. No MRG.  ABDOMEN: Flat, soft. Bowel sounds x 4. Generalized diffuse tenderness. No guarding, rigidity, peritoneal signs, hepatosplenomegaly or other abnormalities. There is no Faythe CasaGrey Turner or Cullen sign.  SKIN: Warm, dry, pink. No erythema, lesion or rash.  EXTREMITIES: MAEW x 4. Strength 5/5.  NEUROLOGICAL: Alert, oriented x 3. Cranial nerves II-XII intact. Speech clear. No facial droop.  PSYCHIATRIC: Pleasant, calm, cooperative, frustrated with health.   IMPRESSION AND PLAN: Abdominal pain, chronic pancreatitis, liver cirrhosis, likely alcohol related, dyspepsia. Chronic pancreatitis is likely related to his cystic fibrosis history and possibly exacerbated by his recent alcohol ingestion. Decreased pancreatic function may confuse laboratory interpretation of amylase and/or lipase. There are signs of chronic pancreatitis on CT, i.e., fatty replacement of the pancreas. I have discussed with Dr. Marva PandaSkulskie and would recommend schedule Zofran for nausea, pantoprazole 40 mg b.i.d. for acid inhibition given his history of gastritis, duodenitis, and consideration of TPN to meet nutritional requirements. Additionally, would suggest consult with interventional radiology versus pain clinic for celiac block for the abdominal pain. Consider med center transfer secondary to comorbidities for his cirrhosis. For now, recommend alcohol abstention. He may be agreeable to some counseling for this in the future and agrees that he should not partake again. Further recommendations to follow. Thank you very much for this consult.   These services were provided by Vevelyn Pathristiane Davonne Baby, MSN, [facility], in collaboration with Barnetta ChapelMartin Skulskie, MD with whom I have discussed this patient in  full.    ____________________________ Keturah Barrehristiane H. Devanshi Califf, NP chl:TT D: 01/23/2014 19:33:47 ET T: 01/23/2014 20:32:56 ET JOB#: [id number]  cc: Keturah Barrehristiane H. Amaris Garrette, NP, <Dictator> Eustaquio MaizeHRISTIANE H Jamey Demchak FNP ELECTRONICALLY SIGNED 01/24/2014 12:55

## 2014-10-06 NOTE — Consult Note (Signed)
PATIENT NAME:  [name], [name] MR#:  [id number] DATE OF BIRTH:  [birth year]  DATE OF CONSULTATION:  01/24/2014  CONSULTING PHYSICIAN:  [name], MD  IDENTIFYING INFORMATION AND REASON FOR CONSULTATION: A 34 year old man with a history of cystic fibrosis currently in the hospital for pancreatitis. Consult was for possible medication seeking or doctor shopping as it was phrased.   HISTORY OF PRESENT ILLNESS: Information obtained from the chart and the patient. The chart, the patient states that he is having a significant amount of pain as his chief complaint. He has not been feeling comfortable with eating and feels afraid that his pain will get worse if he starts eating again. He tells me that his mood is obviously affected by the pain, but he denies feeling severely depressed. He has some difficulty sleeping at night, especially related to the pain. Appetite is clearly poor. He denies any suicidal ideation whatsoever. Denies any homicidal ideation. Denies any psychotic symptoms. The patient describes how his pancreatitis has become more frequent and recurrent, and he has had multiple episodes this year. He has a pulmonologist who is his primary doctor for his cystic fibrosis. He does not routinely see a gastroenterologist and does not have a primary care doctor. He has briefly discussed chronic pain with his pulmonologist, but they have not formulated a chronic pain plan and his doctor does not routinely prescribe pain medicine for him. The patient denies that he has been in any way trying to receive excess pain medication. Reports that all the episodes he has had where he has gone to physicians this year had been when he was in severe pain. He denies that he is abusing narcotics. Denies abuse of any other drugs. He does report that he has had a drinking problem and that he had it under control for quite a while, but recently had a relapse. He understands that this is a major problem with his  pancreatitis and is working on getting it under control. The patient states that his stress level is high from his illness and also from his interaction with his mother. He uses rather raw language to describe his mother and says that she has accused him in the past of abusing drugs and that she always, by his account, thinks the worst of him. He is not living with her right now. The patient, himself, has no other really acute psychiatric complaints.   PAST PSYCHIATRIC HISTORY: He does acknowledge that he has had problems with mood for years. More recently, he was diagnosed with bipolar [facility]. He had a hospitalization for a suicide attempt earlier this year. Since then, he has been treated with a combination of Seroquel, Depakote and a low dose of mirtazapine. He feels like those medications have been helpful in controlling his mood swings and keeping him from being as depressed as he was before. He is currently being followed by a [facility].   FAMILY HISTORY: Positive for bipolar disorder.   PAST MEDICAL HISTORY: The patient has cystic fibrosis. Has chronic pancreatitis as a result of his cystic fibrosis. Episodes seem to have been more frequent recently.   SOCIAL HISTORY: He is currently living with a friend,  but is hoping to live independently again soon. He is employed as a Acupuncturisttechnical writer and has been able to continue to do his job when he is not acutely hospitalized. He feels like he has a positive social life with close friends,  although he is not involved with a romantic partner currently.   REVIEW OF SYSTEMS: Dominated by abdominal pain. Mood is mildly depressed. Sleep can be poor. Can be irritable and grumpy, especially when he is in a lot of pain. Denies hallucinations. Denies suicidal or homicidal ideation.   MENTAL STATUS EXAM: Acutely and chronically sick -appearing gentleman, interviewed in his hospital bed.  He was cooperative with the interview despite appearing to be in significant pain. Eye contact was intermittent. Psychomotor activity very limited. Speech was decreased in total amount, but he responded fully to questions and appeared to be making a sincere effort. Affect was dysphoric, again, as he appeared to be in pain. Mood was described as being okay. Thoughts were lucid with no sign of loosening associations or delusions. Denies auditory or visual hallucinations. Denies suicidal or homicidal ideation. He could recall 3/3 objects at 3 minutes, was alert and oriented x 4. Appeared to be of normal intelligence and had good insight and judgment.   Some outside data I got was by reviewing his controlled substance prescription in the [facility] controlled substance database. He has had multiple fills for pain medicine over the last several months but as far as I can tell none of them have overlapped. There does not seem to be any sign of him getting multiple prescriptions filled simultaneously. Although he has had prescriptions from several different physicians, this appears to be because he has been in different hospitals and presented at different emergency rooms for treatment. His use of pain medicine does not appear to be clearly escalating or out of control from what I can see from his legitimate purchases.   ASSESSMENT: This is a 34 year old man who is currently not reporting any major mood or psychotic symptoms. He was cooperative in the interview, but denies that he has been abusing drugs. He feels that his use of (Dictation Anomaly) <<narcoticsMISSING TEXT>> has been appropriate and that he has not be  using it except when he is in acute pain. I cannot find any evidence to the contrary. He does have a history of bipolar disorder and had been taking Seroquel 100 mg in the morning and 200 mg at night, as well as Depakote 500 mg at night and Remeron 15 mg at night prior to hospitalization. He is asking  to have those medicines restarted.   TREATMENT PLAN: I have gone ahead and written orders to restart his Seroquel, Remeron and Depakote, although Depakote is not an ideal medicine with some liver insufficiency and can sometimes cause GI distress, he feels it has been instrumental in helping him and had not been causing any side effects and his dose is fairly low. The patient was educated about his medicine and is agreeable to restarting them. Other than this, I see no sign of need for other psychiatric intervention at this time. I will follow up as needed.   DIAGNOSIS, PRINCIPAL AND PRIMARY:  AXIS I:  Bipolar disorder type II, mixed episode.   SECONDARY DIAGNOSES:  AXIS I:  No further.  AXIS II:  No diagnosis.  AXIS III:  Chronic pain, acute pain,  pancreatitis, cystic fibrosis.  AXIS V:  Functioning at time of evaluation 47.    ____________________________ Audery Amel, MD jtc:JT D: 01/24/2014 22:44:36 ET T: 01/25/2014 00:22:55 ET JOB#: [id number]  cc: Audery Amel, MD, <Dictator> Audery Amel MD ELECTRONICALLY SIGNED 02/23/2014 16:56

## 2014-10-06 NOTE — Consult Note (Signed)
General Aspect Consulted by Dr. Sherryll BurgerShah regarding possible celiac plexus block for severe abdominal pain.   Present Illness 34 yo male with history of chronic pancreatitis with multiple episodes of pancreatitis since age of [age].  Admitted with severe abdominal pain and pain control.  CT shows atrophic, fatty replaced pancreas without significant surrounding inflammation, pancreatic pseudocyst or pancreatic calcifications.  Patient describes baseline 4-5/10 pain chronically with exacerbations rated up to 8-10/10.  Pain described as aching pain centered in epigastric region and to right of midline that radiates into back.  Associated nausea, vomiting and anorexia.  No fever or chills.    No Known Allergies:   Case History and Physical Exam:  Chief Complaint Abdominal Pain   Past Medical Health Pancreatitis   Past Surgical History None   Family History Non-Contributory   Abdomen Other  Tender to palpation in epigastric region.  No distention.   CT:    08-Aug-15 15:29, CT Abdomen and Pelvis With Contrast  CT Abdomen and Pelvis With Contrast   REASON FOR EXAM:    (1) abdominal pain; (2) abdominal pain  COMMENTS:       PROCEDURE: CT  - CT ABDOMEN / PELVIS  W  - Jan 20 2014  3:29PM     CLINICAL DATA:  Epigastric pain, cystic fibrosis, recurrent acute  pancreatitis    EXAM:  CT ABDOMEN AND PELVIS WITH CONTRAST    TECHNIQUE:  Multidetector CT imaging of the abdomen and pelvis was performed  using the standard protocol following bolus administration of  intravenous contrast.  CONTRAST:  100 mL Isovue 370 IV    COMPARISON:  None.    FINDINGS:  Mild bronchiectasis in the right middle lobe and lingula, likely  related to known cystic fibrosis.    Macronodular hepatic contour, particularly along the inferior liver  (series 2/ image 26), reflecting cirrhosis. Subtle focal fat/  altered perfusion in the central liver (series 2/image 14, 17, and  19) is again noted, although better  visualized on the prior study.    Splenomegaly, measuring 14.4 cm in craniocaudal dimension.  Bilateral adrenal glands are within normal limits.    Fatty atrophy of the pancreas, likely reflecting exocrine pancreatic  dysfunction related to known cystic fibrosis. No peripancreatic  inflammatory changes on CT to confirm a diagnosis of acute  pancreatitis.    Status post cholecystectomy. No intrahepatic or extrahepatic ductal  dilatation.    Kidneys are within normal limits.  No hydronephrosis.    No evidence of bowel obstruction.  Normal appendix.    No evidence of abdominal aortic aneurysm.  No abdominopelvic ascites.    No suspicious abdominopelvic lymphadenopathy.    Prostate is unremarkable.    Bladder is within normal limits.    Visualized osseous structures are within normal limits.     IMPRESSION:  No peripancreatic inflammatory changes on CT to confirm a diagnosis  of acute pancreatitis. Fattyatrophy of the pancreas, likely  reflecting exocrine pancreatic dysfunction related to known cystic  fibrosis.  No evidence of bowel obstruction.  Normal appendix.    Cirrhosis with splenomegaly.      Electronically Signed    By: Charline BillsSriyesh  Krishnan M.D.    On: 01/20/2014 15:51         Verified By: Charline BillsSRIYESH . KRISHNAN, M.D.,    Impression Details of celiac plexus neurolytic ablation discussed with patient.  Procedure is done under CT guidance with injection of anesthetic, steroid and absolute alcohol at level of celiac plexus via  percutaneous small gauge needle advanced to anatomic location of celiac plexus. 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  Explained to patient that celiac plexus injection can have very variable results in the setting of pain associated with chronic pancreatitis.  May be anything from substantial relief of pain to no relief.  Risks include bleeding, infection, organ injury, vascular injury, neuropathy, orthostatic hypotension and  diarrhea.  Hypotension and diarrhea usually transient complications.   Plan After discussing pros and cons of celiac plexus block, the patient would like to proceed with the procedure.  He has been NPO except sips with meds.  There is potential credentialing issue as the procedure has not been performed before in Radiology at [facility].  Will either proceed with injection this afternoon here or tomorrow at [facility] if there is an issue with obtaining priveleges here on short notice.   Electronic Signatures: Reola Calkins (MD)  (Signed 13-Aug-15 10:29)  Authored: General Aspect/Present Illness, Allergies, History and Physical Exam, Radiology, Impression/Plan   Last Updated: 13-Aug-15 10:29 by Reola Calkins (MD)

## 2014-10-06 NOTE — Consult Note (Signed)
Brief Consult Note: Diagnosis: abdominal pain.   Patient was seen by consultant.   Consult note dictated.   Comments: Appreciate consult for 34 y/o caucasian man with history of CF followed by [facility], chronic pancreatitis, and bipolar disorder f/b Dr Lolly MustacheArfeen, & liver cirrhosis he attributes to heavy etoh in past, for evaluation of possible acute on chronic pancreatitis. Orig admitted 8/6 for abd pain, NV trigd by food- got better but restarted over the last day. [facility] same. States tx'd with dilaudid/phenergan & got better. States no abx during tx. In discussion with patient, he admits to relapsing with some etoh a few weeks ago prior to sx starting, but has had no further etoh since. Denies illicits.  States he really doesn't have chronic abd pain at home, but has been hospitalized 3-4 times over the last 3064m for same c/o now.Has no pain meds at home. Takes ZenPep, 8 caps with meals. Does not follow with anyone besides his CF provider for his pancreatitis. Does not follow with anyone for his cirrhosis. Says he had [facility]GD [facility] recently with gastritis/duodenitis,but not taking PPI at home. Reports that his abdominal pain is generalized but primarily at the umbilicus. Food hurts. Pain meds relieve. No vomiting today, some nausea, Denies hematemesis and NSAIDs. Denies melena/hematochezia. Reports one loose yellow stool/d. No complaints of current weight loss. States he had a flexible sig by Dr Julieta GuttingHayashi at a [facility] in [place] w/neg findings a few yrs ago. NO other GI complaints Impression/plan: Abd pain, dyspepsia. Chronic pancreatitis- likely related to CF hx &poss exacerbated by recent etoh. < pancreatic fx may confuse lab interp of lipase/amylase. Signs of chronic pancreatitis on CT (ie fatty replacment of pancreas).  Have d/w Dr Marva PandaSkulskie and would recommend scheduled Zofran for nausea, Pantoprazole 40mg  po bid for acid inhibition given hx gastritis/duodenitis,   and consideration of TPN to meet nutritional requirements, consult with IR v paic clin for celiac block. Consider med ctr  xfer sec to comorbidities.Cirrhosis- etoh abstention.  Electronic Signatures: Vevelyn PatLondon, Nylen Creque H (NP)  (Signed 11-Aug-15 19:20)  Authored: Brief Consult Note   Last Updated: 11-Aug-15 19:20 by Keturah BarreLondon, Lois Ostrom H (NP)

## 2014-10-06 NOTE — Discharge Summary (Signed)
PATIENT NAMClaudina Lick:  Bush, [name] MR#:  [id number] DATE OF BIRTH:  17-Feb-1981  DATE OF ADMISSION:  01/18/2014 DATE OF DISCHARGE:  01/26/2014  PRIMARY CARE PHYSICIAN:  [facility] pul[facility].  FINAL DIAGNOSES: 1.  Epigastric abdominal pain, chronic pancreatitis, chronic pain.  2.  Nausea and vomiting.  3.  Cystic fibrosis.  4.  Diabetes.  5.  Cirrhosis on liver CT scan.    PROCEDURE:  Done by Dr. Fredia SorrowYamagata included a celiac block.   MEDICATIONS ON DISCHARGE: Include Zenpep 20,000 units, 68,000 units, 109,000 units orally delayed capsule, 8 capsules 3 times a day with meals, Ursodiol 250 mg 3 tablets 3 times a day, Protonix 40 mg 1 tablet twice a day, hydromorphone 2 mg 1 tablet every 6 hours for 4 days, 1 tablet every 8 hours for 4 days, 1 tablet every 12 hours for 4 days, 1 tablet daily for 4 days then stop, Depakote 500 mg at bedtime, Remeron 15 mg at bedtime, Seroquel 100 mg 1 tablet in the morning and 2 tablets at night, promethazine 25 mg every 6 hours for 6 days as needed for nausea, vomiting, Humulin R sliding scale before meals and at bedtime.   DIET: Carbohydrate-controlled diet. Bland diet.  FOLLOWUP:  Follow up in 1-2 weeks with your pulmonologist at [facility].  HOSPITAL COURSE:  For hospital course up to August 12, please see interim discharge summary dictated by Dr. Sherryll BurgerShah.  August 13 and 15, I started TPN because the patient has been on poor nutritional status during the entire hospital stay. N.p.o. most of the hospital stay. Dr. Fredia SorrowYamagata took 4 celiac block on August 13.  The patient's pain was much improved. By August 14, the patient ate lunch, solid diet and tolerated and kept down and was stable enough to go home and he wanted to go home. The patient was discharged home in stable condition. I did switch over his pain medications from IV to orally on August 13, which may have triggered things too on going home.  1.  For the patient's epigastric abdominal pain, chronic pancreatitis and  chronic pain, he had a celiac block, which seemed to help his pain.  2.  For his nausea and vomiting, this has improved.  3.  For his cystic fibrosis, [facility] up [facility].  4.  For his diabetes, he just does sliding scale.  5.  Cirrhosis on the liver seen on CT scan. Follow up as outpatient.  TIME SPENT ON DISCHARGE: 35 minutes.    ____________________________ Herschell Dimesichard J. Renae GlossWieting, MD rjw:DT D: 01/26/2014 13:48:18 ET T: 01/26/2014 14:04:40 ET JOB#: [id number]  cc: Herschell Dimesichard J. Renae GlossWieting, MD, <Dictator> Salley ScarletICHARD J Edee Nifong MD ELECTRONICALLY SIGNED 02/16/2014 15:03

## 2014-10-06 NOTE — Consult Note (Signed)
Chief Complaint:  Subjective/Chief Complaint Please see full GI consult and brief consult note.  34 yo male admitted with abdominal pain, nausea and vomiting.  Long h/o Cystic Fibrosis, dx at age 34, pancreatitis diagnosed at [age].  History of etoh abuse in the past and ongoing intermitttant etoh use. Patient with h/o chronic pain, which he states is a "5/10" at home and he states he has no pain prescriptions at home.  He has [facility], where lidocaine patches and lyrica were tried without benefit.  He has also been on gabapentin without benefit.  His chronic pain in epigstric.  He does have a h/o PUDz in the past and states he takes protonix daily.  He was an [facility] a [facility] ago for similar problem, and stattes adn egd was done there with the finding of "gastritis and irritation in the duodenum".    Pain is multifactorial.  CT shows fatty atrophy of the pancreas and lipase is below normal consistant with this.  Hisotry of cirrhosis probably etoh related.  He has had a CCY for issues surrounding recurrent pancreatitis.   One concern is that the abdominal pain could be related to not only chronic pancreatitis (actually moreso chronic nerve irritation of the celiac plexus from previous history of recurrent pancreatitis) and probable bile gastritis related to h/o ccy and recent egd finding.   Recommend continuing ppi, change to bid and add carafate liquid qid.  Also scheduled dosing of ondansetron.  Recommend consideration of transfer to tertiary institution where consideration of possible celiac block might be a possibility. I have discussed this with patient and he was not familiar with this. [facility] is [facility] for the CF and pancreatitis.   Electronic Signatures: Barnetta ChapelSkulskie, Martin (MD)  (Signed 11-Aug-15 20:32)  Authored: Chief Complaint   Last Updated: 11-Aug-15 20:32 by Barnetta ChapelSkulskie, Martin (MD)

## 2014-10-06 NOTE — Consult Note (Signed)
Psychiatry: Came to follow-up with the patient today but he was in radiology much of the afternoon having his plexus block done.  I will check up on him again tomorrow.  Electronic Signatures: Clapacs, Jackquline DenmarkJohn T (MD)  (Signed on 13-Aug-15 17:18)  Authored  Last Updated: 13-Aug-15 17:18 by Audery Amellapacs, John T (MD)

## 2014-10-06 NOTE — Consult Note (Signed)
Chief Complaint:  Subjective/Chief Complaint continues to have abdominal pain about same level 8/10.  positive nausea.   VITAL SIGNS/ANCILLARY NOTES: **Vital Signs.:   12-Aug-15 15:50  Vital Signs Type Q 8hr  Temperature Temperature (F) 98.4  Celsius 36.8  Pulse Pulse 58  Respirations Respirations 16  Systolic BP Systolic BP 753  Diastolic BP (mmHg) Diastolic BP (mmHg) 77  Mean BP 93  Pulse Ox % Pulse Ox % 96  Pulse Ox Activity Level  At rest  Oxygen Delivery Room Air/ 21 %   Brief Assessment:  Cardiac Regular   Respiratory clear BS   Gastrointestinal details normal Soft  Nondistended  Bowel sounds normal  mild to modearte epigastric tenderness.   Lab Results: Hepatic:  12-Aug-15 04:24   Bilirubin, Total 0.9  Alkaline Phosphatase 100 (46-116 NOTE: New Reference Range 01/02/14)  SGPT (ALT) 23 (14-63 NOTE: New Reference Range 01/02/14)  SGOT (AST) 20  Total Protein, Serum 6.6  Albumin, Serum  3.2  Routine Chem:  12-Aug-15 04:24   Glucose, Serum 68  BUN  5  Creatinine (comp) 0.86  Sodium, Serum 141  Potassium, Serum 3.6  Chloride, Serum 107  CO2, Serum 21  Calcium (Total), Serum  7.7  Osmolality (calc) 277  eGFR (African American) >60  eGFR (Non-African American) >60 (eGFR values <80mL/min/1.73 m2 may be an indication of chronic kidney disease (CKD). Calculated eGFR is useful in patients with stable renal function. The eGFR calculation will not be reliable in acutely ill patients when serum creatinine is changing rapidly. It is not useful in  patients on dialysis. The eGFR calculation may not be applicable to patients at the low and high extremes of body sizes, pregnant women, and vegetarians.)  Anion Gap 13  Lipase  38 (Result(s) reported on 24 Jan 2014 at 05:38AM.)  Amylase, Serum 31 (Result(s) reported on 24 Jan 2014 at 05:33AM.)  Routine Hem:  12-Aug-15 04:24   WBC (CBC) 6.1  RBC (CBC)  4.05  Hemoglobin (CBC)  12.6  Hematocrit (CBC)  35.9   Platelet Count (CBC)  131  MCV 89  MCH 31.0  MCHC 35.0  RDW  15.7  Neutrophil % 56.6  Lymphocyte % 33.8  Monocyte % 5.4  Eosinophil % 3.4  Basophil % 0.8  Neutrophil # 3.5  Lymphocyte # 2.1  Monocyte # 0.3  Eosinophil # 0.2  Basophil # 0.1 (Result(s) reported on 24 Jan 2014 at 05:07AM.)   Radiology Results: CT:    08-Aug-15 15:29, CT Abdomen and Pelvis With Contrast  CT Abdomen and Pelvis With Contrast   REASON FOR EXAM:    (1) abdominal pain; (2) abdominal pain  COMMENTS:       PROCEDURE: CT  - CT ABDOMEN / PELVIS  W  - Jan 20 2014  3:29PM     CLINICAL DATA:  Epigastric pain, cystic fibrosis, recurrent acute  pancreatitis    EXAM:  CT ABDOMEN AND PELVIS WITH CONTRAST    TECHNIQUE:  Multidetector CT imaging of the abdomen and pelvis was performed  using the standard protocol following bolus administration of  intravenous contrast.  CONTRAST:  100 mL Isovue 370 IV    COMPARISON:  None.    FINDINGS:  Mild bronchiectasis in the right middle lobe and lingula, likely  related to known cystic fibrosis.    Macronodular hepatic contour, particularly along the inferior liver  (series 2/ image 26), reflecting cirrhosis. Subtle focal fat/  altered perfusion in the central liver (series 2/image 14, 17, and  19) is again noted, although better visualized on the prior study.    Splenomegaly, measuring 14.4 cm in craniocaudal dimension.  Bilateral adrenal glands are within normal limits.    Fatty atrophy of the pancreas, likely reflecting exocrine pancreatic  dysfunction related to known cystic fibrosis. No peripancreatic  inflammatory changes on CT to confirm a diagnosis of acute  pancreatitis.    Status post cholecystectomy. No intrahepatic or extrahepatic ductal  dilatation.    Kidneys are within normal limits.  No hydronephrosis.    No evidence of bowel obstruction.  Normal appendix.    No evidence of abdominal aortic aneurysm.  No abdominopelvic  ascites.    No suspicious abdominopelvic lymphadenopathy.    Prostate is unremarkable.    Bladder is within normal limits.    Visualized osseous structures are within normal limits.     IMPRESSION:  No peripancreatic inflammatory changes on CT to confirm a diagnosis  of acute pancreatitis. Fattyatrophy of the pancreas, likely  reflecting exocrine pancreatic dysfunction related to known cystic  fibrosis.  No evidence of bowel obstruction.  Normal appendix.    Cirrhosis with splenomegaly.      Electronically Signed    By: Julian Hy M.D.    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  not eating.  2) intractible abdominal pain. 3) recent egd with gastritis/duodenitis.   Plan 1) on carafate and ppi 2) consult for possible celiac block noted.  following.   Electronic Signatures: Loistine Simas (MD)  (Signed 12-Aug-15 18:17)  Authored: Chief Complaint, VITAL SIGNS/ANCILLARY NOTES, Brief Assessment, Lab Results, Radiology Results, Assessment/Plan   Last Updated: 12-Aug-15 18:17 by Loistine Simas (MD)

## 2014-10-06 NOTE — H&P (Signed)
PATIENT NAME:  [name], DURRELL MR#:  [id number] DATE OF BIRTH:  01-Dec-1980  DATE OF ADMISSION:  01/18/2014  REFERRING PHYSICIAN: Dr. Sharyn Creamer.   PRIMARY CARE PHYSICIAN: At [facility].   CHIEF COMPLAINT: Abdominal pain.   HISTORY OF PRESENT ILLNESS: Mr. Antkowiak is a 34 year old with a history of cystic fibrosis who was recently admitted about a week back at [facility] for acute pancreatitis. Per patient, he was treated and was discharged home. The patient states further started to experience again symptoms for the last 2 days with this severe pain in the epigastric area 10/10 intensity, unable to keep down any food. The patient states has recently moved to [place] to live with his friend. Denies having any fever. Work-up in the Emergency Department is completely unremarkable. The patient states had multiple episodes of pancreatitis in the past, usually gets admitted to [facility].   PAST MEDICAL HISTORY:  Cystic fibrosis.  Chronic pancreatitis.   PAST SURGICAL HISTORY: None.   ALLERGIES: No known drug allergies.   HOME MEDICATIONS:  1. Pancreatic enzymes 8 capsule 3 times a day.  2. Ursodiol 250 mg 3 tablets 3 times a day.  3. Protonix 40 mg daily.   SOCIAL HISTORY: No history of smoking. No alcohol or drug use. The patient states works for [employer] and currently living with a friend in [place] and works in [place].   FAMILY HISTORY: Diabetes mellitus.   REVIEW OF SYSTEMS:  CONSTITUTIONAL: Experiencing generalized weakness.  EYES: No change in vision.  ENT: No change in hearing.  RESPIRATORY: No cough, shortness of breath.  CARDIOVASCULAR: No chest pain, palpations.  GASTROINTESTINAL: As states has nausea; however, did not have any episodes of vomiting.  GENITOURINARY: No dysuria or hematuria.  ENDOCRINE: The patient states has history of diabetes mellitus, currently not on any medications.  GENITOURINARY: No dysuria or  hematuria.  HEMATOLOGIC: No easy bruising or bleeding.  SKIN: No rash or lesions.  MUSCULOSKELETAL: No joint pains and aches.  NEUROLOGIC: No weakness or numbness in any part of the body.   PHYSICAL EXAMINATION:  GENERAL: This is a well-built, well-nourished, age-appropriate male lying down in the bed, not in distress.  VITAL SIGNS: Temperature 98.4, pulse 85, blood pressure 132/113, respiratory rate of 16, oxygen saturation is 94% on room air.  HEENT: Head normocephalic, atraumatic. Eyes, no scleral icterus. Conjunctivae normal. Pupils equal and react to light. Extraocular movements are intact. Mucous membranes moist. No pharyngeal erythema.  NECK: Supple. No lymphadenopathy. No JVD. No carotid bruit.  CHEST: Has no focal tenderness.  LUNGS: Bilaterally clear to auscultation.  HEART: S1, S2 regular. No murmurs are heard.  ABDOMEN: Bowel sounds present. Soft, nontender, nondistended.  EXTREMITIES: No pedal edema. Pulses 2+.  NEUROLOGIC: The patient is alert, oriented to place, person, and time. Cranial nerves II through XII intact. Motor 5 out of 5 in upper and lower extremities.   LABORATORIES: Lipase 35, KUB nonobstructive bowel gas pattern, splenomegaly.  Ultrasound of the right upper quadrant shows cirrhosis of the liver. CBC: WBC of 11, hemoglobin 16, platelet count of 210,000.   ASSESSMENT AND PLAN: Mr. Kopko is a 34 year old male states has a history of cystic fibrosis who comes to the Emergency Department with complaints of abdominal pain.  1. Questionable acute on chronic pancreatitis. Admit the patient. Continue to keep the patient on clear liquids. Continue with the pain medications, intravenous fluids. If patient is able to tolerate the diet will consider sending  the patient with p.o. pain medications.  2. Diabetes mellitus. Keep the patient on sliding scale insulin.  3. History of cystic fibrosis. Continue with pancreatic enzyme replacement.  4. Keep the patient on deep vein  thrombosis prophylaxis with Lovenox.   TIME SPENT: 45 minutes.    ____________________________ Susa GriffinsPadmaja Daimon Kean, MD pv:jh D: 01/19/2014 00:11:28 ET T: 01/19/2014 01:16:16 ET JOB#: [id number]  cc: Susa GriffinsPadmaja Glessie Eustice, MD, <Dictator> Susa GriffinsPADMAJA Sari Cogan MD ELECTRONICALLY SIGNED 01/21/2014 20:57

## 2014-10-06 NOTE — Consult Note (Signed)
PATIENT NAMClaudina Bush:  [employer], [name]#:  [id number] DATE OF BIRTH:  04-15-1981  DATE OF CONSULTATION:  01/21/2014  CONSULTING PHYSICIAN:  Jency Schnieders K. Guss Bundehalla, MD  SEX: Male.  RACE: White.  SUBJECTIVE: The patient was seen in consultation room number 212 at [facility]. The patient is a 34 year old white male who reports that he is employed as a Acupuncturisttechnical writer and is never married, single, and as he has said that he recently moved in to live with a roommate. The patient has history of bipolar disorder for which he was inpatient at [facility] under the care of Dr. Lolly MustacheArfeen and also at [facility]. The patient was asked to be seen for irritability and anger issues.  CHIEF COMPLAINT: "I came here for my stomach problems and need help with my stomach issues."  PAST PSYCHIATRIC HISTORY: History of inpatient hospitalization to psychiatry [facility] for 26 days for an overdose of Xanax and depression. Inpatient hospitalization on psychiatry at [facility] under the care of Dr. Lolly MustacheArfeen for 7 days, history of suicide attempt (overdosed on Xanax). The patient reports that he is being followed by Dr. Lolly MustacheArfeen on outpatient basis at [facility]. Last appointment a month ago, next appointment is coming up soon.   Patient was stabilized and discharged on the following medications: Seroquel 100 mg in the morning and 200 at bedtime, Depakote ER 500 mg at bedtime.  ALCOHOL AND DRUGS: Admits that he used to drink alcohol but quick drinking alcohol a few months ago. Denies any other street or prescription drug abuse.  MENTAL STATUS EXAMINATION: The patient was very irritable, angry, and upset. He stated that he did not want to be seen by any mental health, used foul language, and  bad language with the undersigned, stating that he came here for stomach problems and he needs to be checked out for that and not for mental issues. Admits that he is frustrated and upset and depressed  about the way things are going on. Denies auditory or visual hallucinations. He reports that he knew everything when cognition was tested, denies any ideas or plans to hurt himself further as he was very angry and asked the undersigned just to leave as he came here for a different problem and not for mental health issues. Insight and judgment guarded. Impulse control poor.  IMPRESSION: Bipolar disorder, current episode depressed.  RECOMMENDATION: Continue medications recommended by Dr. Lolly MustacheArfeen as follows: Seroquel 100 mg in the morning and 200 mg at bedtime, Depakote ER 500 mg at bedtime which can be increased to 1000 mg at bedtime. After discharge, the patient will be followed by Dr. Lolly [facility]feen at [name] on outpatient basis.   ____________________________ Jannet MantisSurya K. Guss Bundehalla, MD skc:lt D: 01/21/2014 18:20:22 ET T: 01/21/2014 18:25:55 ET JOB#: [id number]  cc: Monika SalkSurya K. Guss Bundehalla, MD, <Dictator> Beau FannySURYA K Bakari Nikolai MD ELECTRONICALLY SIGNED 01/27/2014 17:16

## 2014-10-06 NOTE — Consult Note (Signed)
Brief Consult Note: Diagnosis: bipolar nos.   Patient was seen by consultant.   Consult note dictated.   Recommend to proceed with surgery or procedure.   Orders entered.   Comments: Psychiatry: Patient seen and chart reviewed. Full note to follow. PAtient has been dx with bipolar disorder and is regularly on seroquel, depakote and remeron. At his request restarted meds. Based on current information I don't see evidence of substance use problems. Will follow.  Electronic Signatures: Audery Amellapacs, Lydiana Milley T (MD)  (Signed 12-Aug-15 17:17)  Authored: Brief Consult Note   Last Updated: 12-Aug-15 17:17 by Audery Amellapacs, Kelissa Merlin T (MD)

## 2014-10-06 NOTE — Consult Note (Signed)
Chief Complaint:  Subjective/Chief Complaint seen for chronic pancreatitis. continues with anorexia, nausea, no emesis.  abd pain still 7/10 per patient.   VITAL SIGNS/ANCILLARY NOTES: **Vital Signs.:   13-Aug-15 08:50  Vital Signs Type Q 8hr  Temperature Temperature (F) 97.7  Celsius 36.5  Pulse Pulse 54  Respirations Respirations 18  Systolic BP Systolic BP 128  Diastolic BP (mmHg) Diastolic BP (mmHg) 77  Mean BP 94  Pulse Ox % Pulse Ox % 95  Pulse Ox Activity Level  At rest  Oxygen Delivery Room Air/ 21 %   Brief Assessment:  Cardiac Regular   Respiratory clear BS   Gastrointestinal details normal Soft  Nondistended  No rebound tenderness  positive bowel sounds, mild diffuse tenderness   Lab Results:  LabObservation:  13-Aug-15 14:55   OBSERVATION Reason for Test celiac plexus nerve block-abdominal pain/chronic pancreatitis-celiac plexus   Radiology Results: CT:    08-Aug-15 15:29, CT Abdomen and Pelvis With Contrast  CT Abdomen and Pelvis With Contrast   REASON FOR EXAM:    (1) abdominal pain; (2) abdominal pain  COMMENTS:       PROCEDURE: CT  - CT ABDOMEN / PELVIS  W  - Jan 20 2014  3:29PM     CLINICAL DATA:  Epigastric pain, cystic fibrosis, recurrent acute  pancreatitis    EXAM:  CT ABDOMEN AND PELVIS WITH CONTRAST    TECHNIQUE:  Multidetector CT imaging of the abdomen and pelvis was performed  using the standard protocol following bolus administration of  intravenous contrast.  CONTRAST:  100 mL Isovue 370 IV    COMPARISON:  None.    FINDINGS:  Mild bronchiectasis in the right middle lobe and lingula, likely  related to known cystic fibrosis.    Macronodular hepatic contour, particularly along the inferior liver  (series 2/ image 26), reflecting cirrhosis. Subtle focal fat/  altered perfusion in the central liver (series 2/image 14, 17, and  19) is again noted, although better visualized on the prior study.    Splenomegaly, measuring 14.4 cm in  craniocaudal dimension.  Bilateral adrenal glands are within normal limits.    Fatty atrophy of the pancreas, likely reflecting exocrine pancreatic  dysfunction related to known cystic fibrosis. No peripancreatic  inflammatory changes on CT to confirm a diagnosis of acute  pancreatitis.    Status post cholecystectomy. No intrahepatic or extrahepatic ductal  dilatation.    Kidneys are within normal limits.  No hydronephrosis.    No evidence of bowel obstruction.  Normal appendix.    No evidence of abdominal aortic aneurysm.  No abdominopelvic ascites.    No suspicious abdominopelvic lymphadenopathy.    Prostate is unremarkable.    Bladder is within normal limits.    Visualized osseous structures are within normal limits.     IMPRESSION:  No peripancreatic inflammatory changes on CT to confirm a diagnosis  of acute pancreatitis. Fattyatrophy of the pancreas, likely  reflecting exocrine pancreatic dysfunction related to known cystic  fibrosis.  No evidence of bowel obstruction.  Normal appendix.    Cirrhosis with splenomegaly.      Electronically Signed    By: Charline BillsSriyesh  Krishnan M.D.    On: 01/20/2014 15:51         Verified By: Charline BillsSRIYESH . KRISHNAN, M.D.,   Assessment/Plan:  Assessment/Plan:  Assessment 1) chronic pancreatitis-abdominal pain, not responsive to treatment.  2) h/o cystic fibrosis- 3) occasional etoh use 4) gastritis/duodenitis on recent EGD per patient.  I have tried to find  this chart in Epic and although he is in the epic system there is no record of recent egd.   Plan 1) continue carafate and ppi. 2) currently s/p celiac block proceedure, hopefully this will help with his intractible pain.   3) Perhaps [name] can give more specific information on where he has the egd and if indeed it has not been done, perhaps one can be considered.  I will ask Dr Mechele Collin to see tomorrow.   Electronic Signatures: Barnetta Chapel (MD)  (Signed 13-Aug-15  17:10)  Authored: Chief Complaint, VITAL SIGNS/ANCILLARY NOTES, Brief Assessment, Lab Results, Radiology Results, Assessment/Plan   Last Updated: 13-Aug-15 17:10 by Barnetta Chapel (MD)

## 2015-09-07 IMAGING — CT CT ABD-PELV W/ CM
2 of 4 series · 17 of 46 positions shown, 19 images · IV contrast (OMNIPAQUE)
Comparison: 10/03/2013

CLINICAL DATA: Chronic pancreatitis with persistent abdominal pain
and vomiting.

EXAM:
CT ABDOMEN AND PELVIS WITH CONTRAST
TECHNIQUE: Multidetector CT imaging of the abdomen and pelvis was performed
using the standard protocol following bolus administration of
intravenous contrast.
CONTRAST:  100mL OMNIPAQUE IOHEXOL 300 MG/ML  SOLN

[Series 2: rtn a/p with · axial · 0.83mm/px · z∈[-710,-290]mm · 14 of 92 slices shown, 16 images]
[im 4/92  soft-tissue]
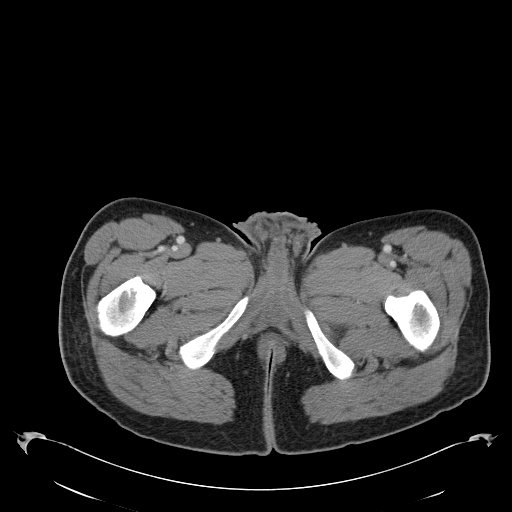
[im 4/92  bone]
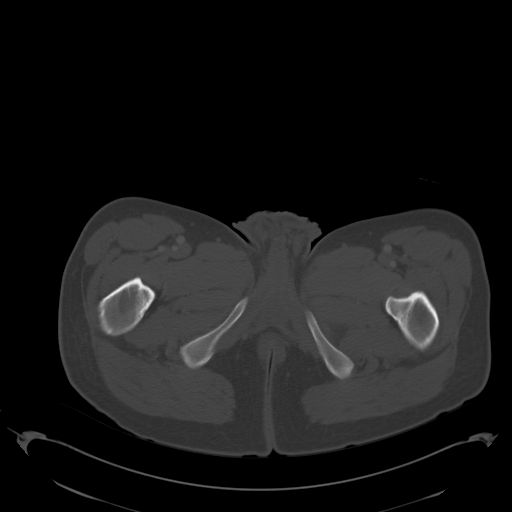
[im 12/92  soft-tissue]
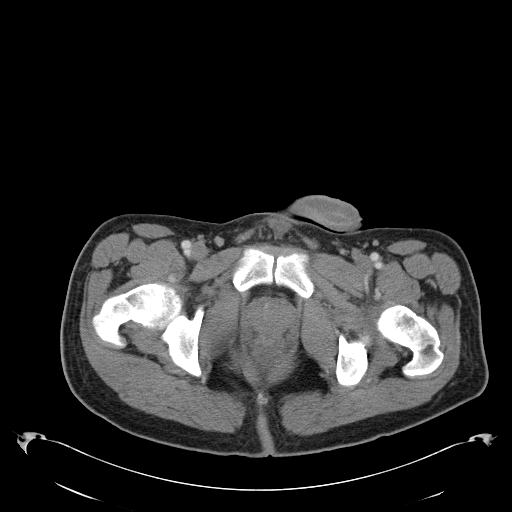
[im 19/92  soft-tissue]
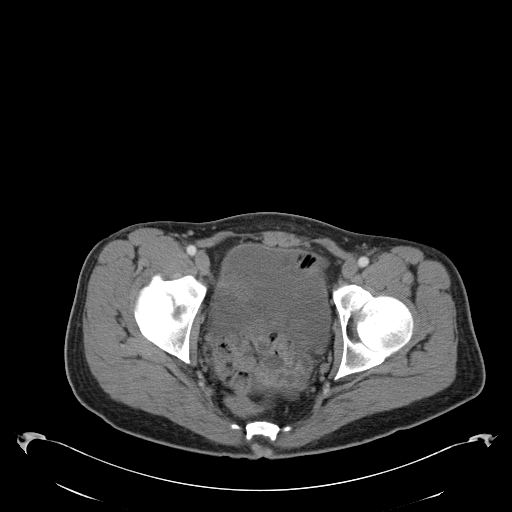
[im 23/92  soft-tissue]
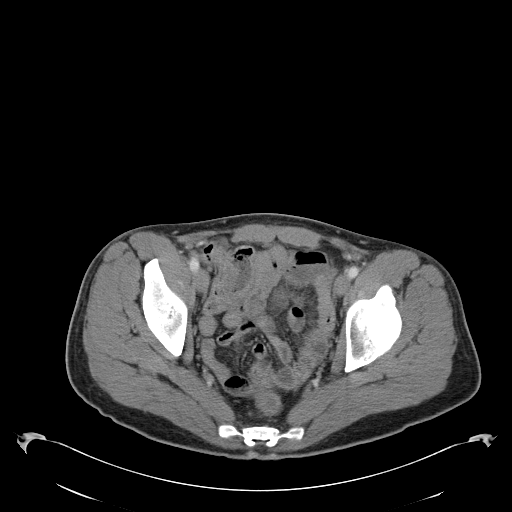
[im 31/92  soft-tissue]
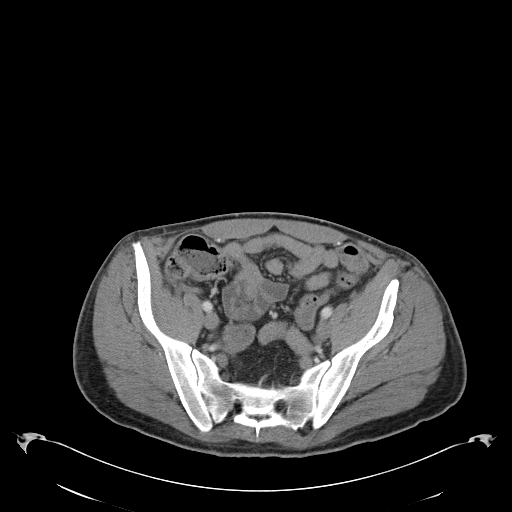
[im 38/92  soft-tissue]
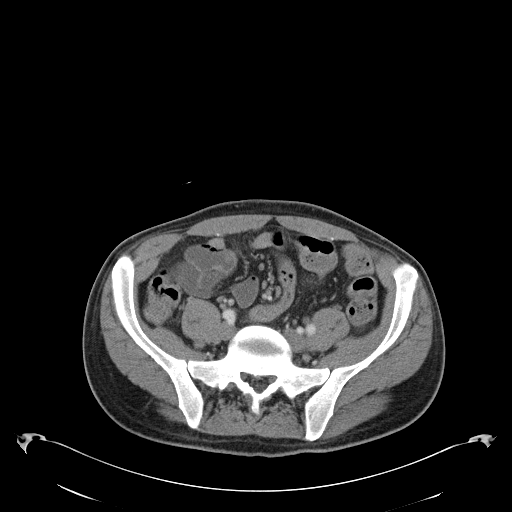
[im 42/92  soft-tissue]
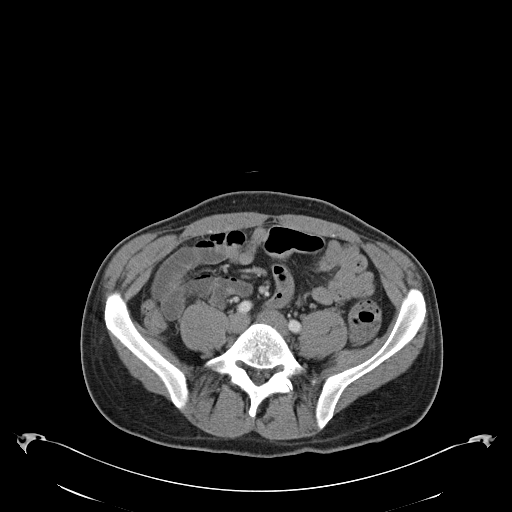
[im 50/92  soft-tissue]
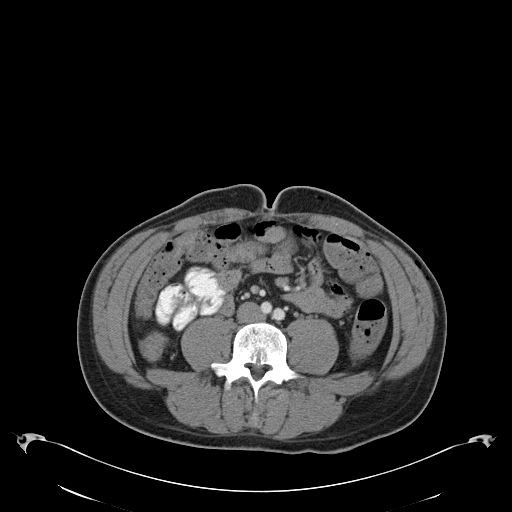
[im 54/92  soft-tissue]
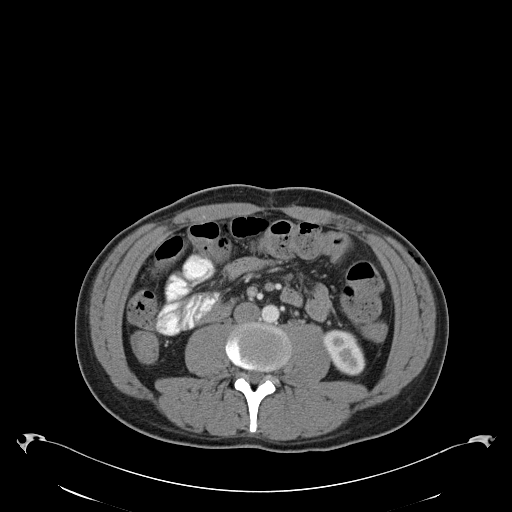
[im 54/92  bone]
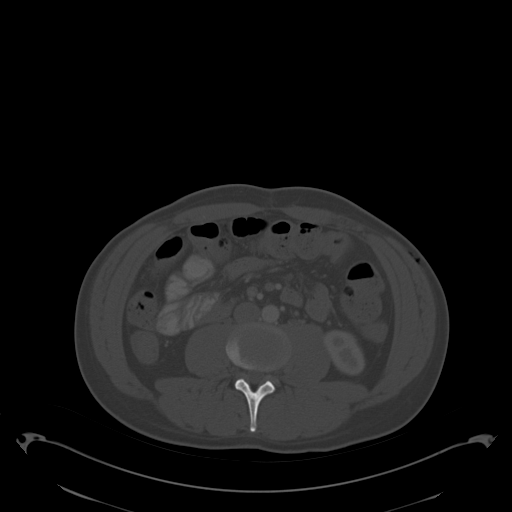
[im 61/92  soft-tissue]
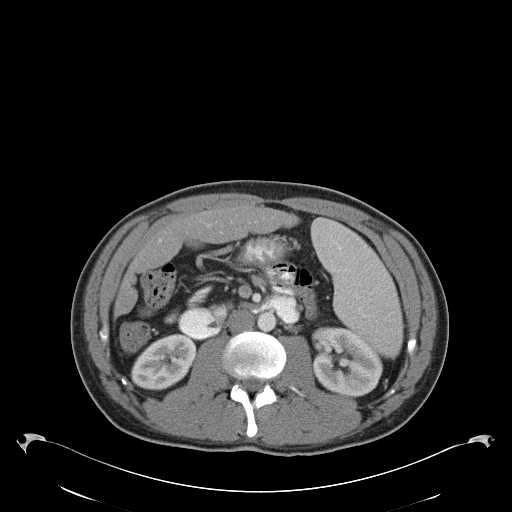
[im 69/92  soft-tissue]
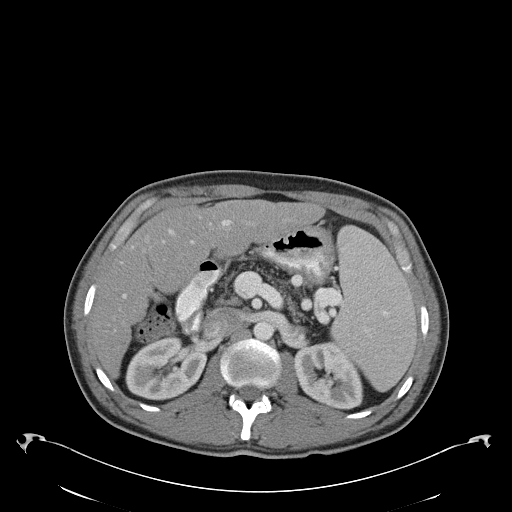
[im 73/92  soft-tissue]
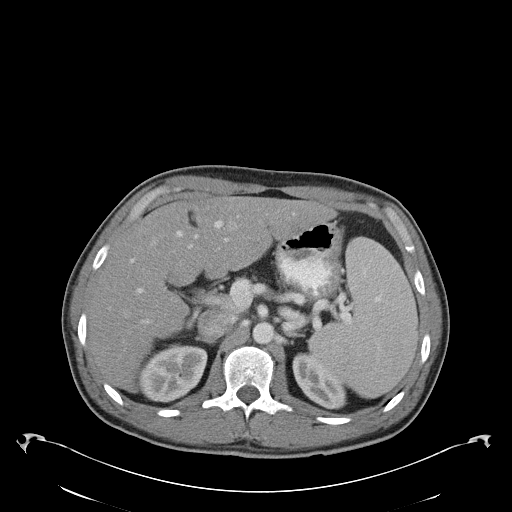
[im 80/92  soft-tissue]
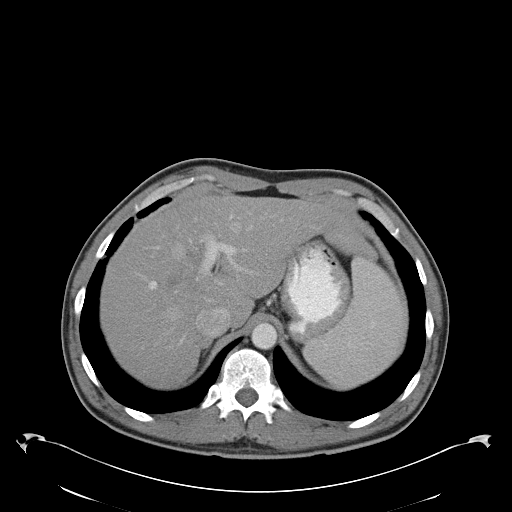
[im 88/92  soft-tissue]
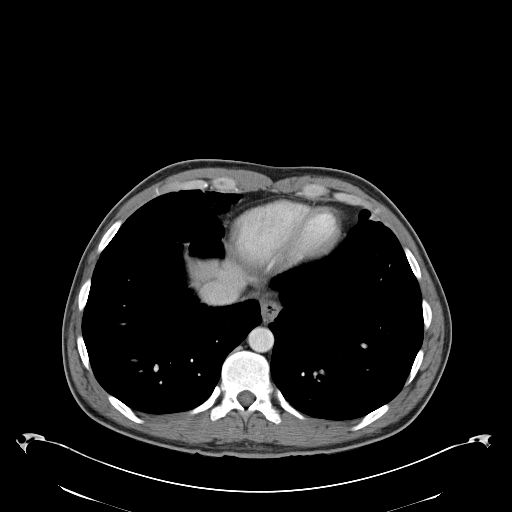

[Series 602: <mpr thick range> · coronal · 0.89mm/px · 3 of 102 slices shown]
[im 34/102  soft-tissue]
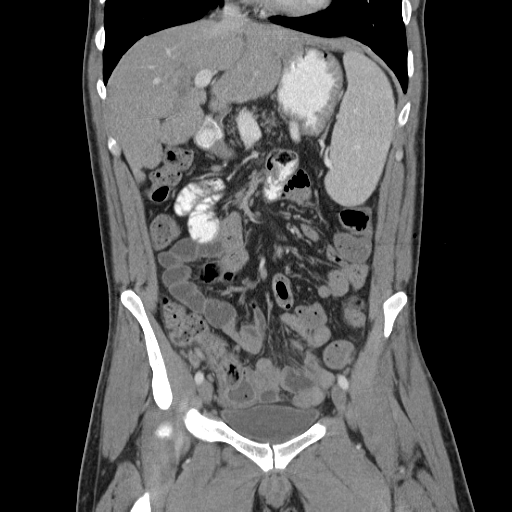
[im 45/102  soft-tissue]
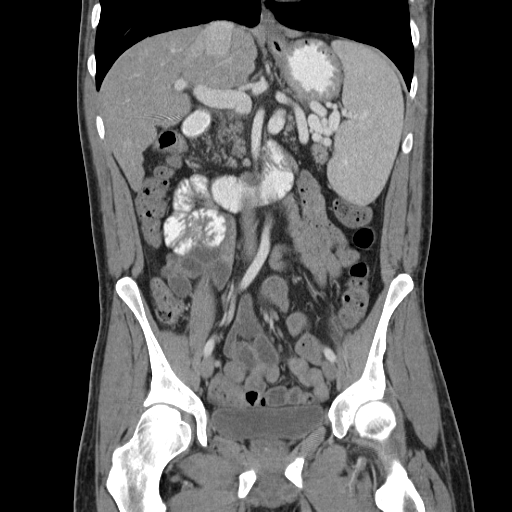
[im 57/102  soft-tissue]
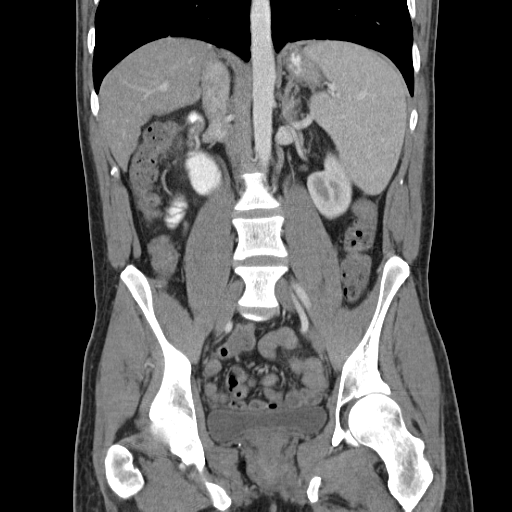

[17 of 46 positions shown; findings below may reference images not displayed]

FINDINGS: There is hepatosplenomegaly with prominent varices in the splenic
hilum with portal vein distention consistent with portal
hypertension. There is hepatic steatosis. Enlargement of the caudate
lobe is consistent with cirrhosis.

Gallbladder has been removed.  No dilated bile ducts.

There is diffuse pancreatic atrophy with no evidence of acute
pancreatitis.

The adrenal glands and kidneys are normal. The bowel appears normal.
No free air or free fluid in the abdomen. Bladder is normal. No
osseous abnormality.

No change since the prior study.
IMPRESSION: 1. No acute abnormality.
2. Hepatosplenomegaly with cirrhosis and hepatic steatosis.
3. Portal hypertension.
4. No acute pancreatic abnormality.  Stomach appears normal.

## 2015-09-23 ENCOUNTER — Encounter (HOSPITAL_COMMUNITY): Payer: Self-pay | Admitting: Psychiatry

## 2015-09-23 ENCOUNTER — Encounter (INDEPENDENT_AMBULATORY_CARE_PROVIDER_SITE_OTHER): Payer: Self-pay

## 2015-09-23 ENCOUNTER — Ambulatory Visit (INDEPENDENT_AMBULATORY_CARE_PROVIDER_SITE_OTHER): Payer: BLUE CROSS/BLUE SHIELD | Admitting: Psychiatry

## 2015-09-23 VITALS — BP 124/82 | HR 85 | Ht 73.0 in | Wt 174.8 lb

## 2015-09-23 DIAGNOSIS — F1921 Other psychoactive substance dependence, in remission: Secondary | ICD-10-CM

## 2015-09-23 DIAGNOSIS — F1010 Alcohol abuse, uncomplicated: Secondary | ICD-10-CM

## 2015-09-23 DIAGNOSIS — F319 Bipolar disorder, unspecified: Secondary | ICD-10-CM

## 2015-09-23 MED ORDER — QUETIAPINE FUMARATE 300 MG PO TABS: 300.0000 mg | ORAL_TABLET | Freq: Every day | ORAL | Status: DC

## 2015-09-23 MED ORDER — QUETIAPINE FUMARATE 100 MG PO TABS: 100.0000 mg | ORAL_TABLET | ORAL | Status: DC

## 2015-09-23 NOTE — Progress Notes (Signed)
[facility] [id number] Progress Note  [name] [id number] 35 y.o.  09/23/2015 9:50 AM  Chief Complaint:  I need my medication.  I was in rehabilitation in [place].  I'm out of Seroquel.  History of Present Illness:  Patient is 35 year old Caucasian, single man who has seen in this office in 2015 for the management of bipolar disorder came to reestablish his care.  He was last seen in November 2015.  Patient admitted he relapsed into drinking and using cocaine and his family forced him to do rehabilitation in [place].  He states 6 month inpatient rehabilitation and after that 3 months and CD IOP.  He was clean for 9 months until February 2017 he relapsed into drinking while he was in [place] and he decided to come back [place].  Patient told he was not happy with his living situation in [place].  He was living in [place] house and his parents agreed that he can come back to [place].  He is living with his parents.  He is happy that he is able to get job in [place] and he will start next Monday.  And rehabilitation his Seroquel was adjusted and Depakote, Vistaril and Remeron was discontinued.  He is taking Seroquel 100 mg in the morning and 400 mg at bedtime but he only takes 300 because 400 mg makes him very sleepy and groggy.  Overall he reports his mood swing anger or irritability is much better.  He denies any mania or psychosis.  He is seeing his endocrinologist on a regular basis.  His hemoglobin is 7.5 and he's been taking his insulin on a regular basis.  He is also seeing his pulmonologist for his cystic fibrosis and chronic pancreatitis.  He is currently not in any relationship.  He still have regrets about his past.  His fiance left him after 7 years in a relationship because patient was drinking and using drugs.  Patient like to reestablish his care and realize he need the medication to help his bipolar disorder.  He is also seeing Radiographer, therapeuticKristen at [facility]  treatment or [address] for counseling.  He denies any panic attack, paranoia, hallucination, psychosis, OCD symptoms.  He denies any tremors or shakes.  His sleep is good when he takes the Seroquel.  He denies any suicidal thoughts or homicidal thought.  Suicidal Ideation: No Plan Formed: No Patient has means to carry out plan: No  Homicidal Ideation: No Plan Formed: No Patient has means to carry out plan: No  Medical History; Patient has cystic fibrosis and chronic pancreatitis.  He sees specialist [facility] and his primary care physician is Dr. Reggie Pilean Chaksupa in [place].  Family History; Patient reported mother has history of bipolar disorder.  Education and Work History; Patient has college education in journalism from [facility].  He used to work in [employer] for many years until his girlfriend suggested to move [place] to become a Clinical research associatelawyer.  Patient quit his job and moved to [place] but due to drinking his fiance left him and patient moved back to [place].  He had worked in a [employer] but he quit the job because he did not like it.  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  Psychosocial History; Patient born and raised in [place].  He moved to [place] area with his fiance but once relationship ended he moved back to [place].  In 2016  he relapsed into drinking and require inpatient rehabilitation in [place] and in February 2017 he moved back to [place].  He is living with his parents.  Currently he is not in any relationship.  He has no children.   Substance Abuse History; Patient has extensive history of drinking and using illegal substances.  He has six-month rehabilitation in [place] followed by 3 month CD IOP.  He endorse taking Xanax, pain medication and cocaine in the past.  He has a history of heavy drinking most of his life.  Past Psychiatric History/Hospitalization(s) Patient  reported history of mood swing, irritability, anger, agitation most of his life.  However he was [facility] on [facility].  He was admitted there for 22 days.  He has at least 4 psychiatric hospitalization in his life.  He also spent 5 days at [facility].  [facility] taking overdose on his pain medication.  In the past he has taken Zoloft, Lyrica, Xanax, Remeron, Vistaril, trazodone, Depakote, Seroquel, Wellbutrin and lithium.  He has at least one history of rehabilitation in [place] for alcohol. Anxiety: Yes Bipolar Disorder: Yes Depression: Yes Mania: Yes Psychosis: No Schizophrenia: No Personality Disorder: No Hospitalization for psychiatric illness: Yes History of Electroconvulsive Shock Therapy: No Prior Suicide Attempts: Yes   Review of Systems: Psychiatric: Agitation: No Hallucination: No Depressed Mood: No Insomnia: Yes Hypersomnia: No Altered Concentration: No Feels Worthless: No Grandiose Ideas: No Belief In Special Powers: No New/Increased Substance Abuse: No Compulsions: No  Neurologic: Headache: No Seizure: No Paresthesias: No  Outpatient Encounter Prescriptions as of 09/23/2015  Medication Sig  . insulin aspart (NOVOLOG) 100 UNIT/ML injection Inject 12 Units into the skin 3 (three) times daily with meals.   . Pancrelipase, Lip-Prot-Amyl, (ZENPEP) 20000 UNITS CPEP Take 160,000 Units by mouth 3 (three) times daily with meals. Take 8 capsules with each meal  . pantoprazole (PROTONIX) 40 MG tablet Take 1 tablet (40 mg total) by mouth daily.  . QUEtiapine (SEROQUEL) 100 MG tablet Take 1 tablet (100 mg total) by mouth every morning.  [name] QUEtiapine (SEROQUEL) 300 MG tablet Take 1 tablet (300 mg total) by mouth at bedtime.  . ursodiol (ACTIGALL) 300 MG capsule Take 600-900 mg by mouth 3 (three) times daily. 900 mg AM, 900 mg afternoon, and 600 mg PM  .  [DISCONTINUED] divalproex (DEPAKOTE ER) 500 MG 24 hr tablet Take 2 tablets (1,000 mg total) by mouth at bedtime.  . [DISCONTINUED] gabapentin (NEURONTIN) 600 MG tablet Take 600 mg by mouth as needed (for pain).   . [DISCONTINUED] hydrOXYzine (VISTARIL) 25 MG capsule Take 1 capsule (25 mg total) by mouth 2 (two) times daily as needed for anxiety.  . [DISCONTINUED] QUEtiapine (SEROQUEL) 100 MG tablet TAKE 1 TABLET BY MOUTH AT 2PM AND 2 TABS AT 8 PM   No facility-administered encounter medications on file as of 09/23/2015.    No results found for this or any previous visit (from the past 2160 hour(s)).  Physical Exam: Consitutional ;  BP 124/82 mmHg  Pulse 85  Ht  (1.854 m)  Wt 174 lb 12.8 oz (79.289 kg)  BMI 23.07 kg/m2  Musculoskeletal: Strength & Muscle Tone: within normal limits Gait & Station: normal Patient leans: N/A   Psychiatric Specialty Exam: General Appearance: Casual  Eye Contact::  Fair  Speech:  Slow  Volume:  Decreased  Mood:  Anxious  Affect:  Appropriate  Thought Process:  Coherent  Orientation:  Full (Time, Place, and Person)  Thought Content:  Rumination  Suicidal Thoughts:  No  Homicidal Thoughts:  No  Memory:  Immediate;   Fair Recent;   Fair Remote;   Fair  Judgement:  Intact  Insight:  Fair  Psychomotor Activity:  Decreased  Concentration:  Fair  Recall:  Good  Fund of Knowledge:  Fair  Language:  Fair  Akathisia:  No  Handed:  Right  AIMS (if indicated):     Assets:  Communication Skills Desire for Improvement Housing  ADL's:  Intact  Cognition:  WNL  Sleep:        Established Problem, Stable/Improving (1), Review of Psycho-Social Stressors (1), Review or order clinical lab tests (1), Decision to obtain old records (1), Review and summation of old records (2), Established Problem, Worsening (2), Review of Last Therapy Session (1), Review of Medication Regimen & Side Effects (2) and Review of New Medication or Change in Dosage  (2)  Assessment: Bipolar disorder type I.  Alcohol abuse.  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 Pancreatitis   . Cystic fibrosis (HCC)   . Alcohol abuse   . Anxiety   . Depression     Plan:  I review his symptoms, history, current medication, collateral information and recent blood work results.  His abdomen A1c is 7.5.  He is taking Seroquel 100 mg in the morning and 300 mg at bedtime.  He is no longer taking Depakote and Remeron.  He feels current medicine is working very well.  He like to continue counseling with Belenda Cruise at [facility].  I reviewed records from [facility] and he has been seeing his pulmonologist and endocrinologist on a regular basis.  He claims to be sober since February 2017.  He like to start his new job and is very optimistic.  I will continue Seroquel 100 mg in the morning and 300 mg at bedtime.  Discussed medication side effects especially Seroquel can cause worsening of diabetes.  Encouraged to keep appointment with his therapist and his medical provider for continuation of care.  Discuss risk of relapse due to noncompliance with medication.  I will see him again in 4 weeks.Time spent 25 minutes.  More than 50% of the time spent in psychoeducation, counseling and coordination of care.  Discuss safety plan that anytime having active suicidal thoughts or homicidal thoughts then patient need to call 911 or go to the local emergency room.    Annamarie Yamaguchi T., MD 09/23/2015

## 2015-10-21 ENCOUNTER — Telehealth (HOSPITAL_COMMUNITY): Payer: Self-pay

## 2015-10-21 ENCOUNTER — Ambulatory Visit (HOSPITAL_COMMUNITY): Payer: Self-pay | Admitting: Psychiatry

## 2015-10-21 DIAGNOSIS — F319 Bipolar disorder, unspecified: Secondary | ICD-10-CM

## 2015-10-21 NOTE — Telephone Encounter (Signed)
Patient would like this to go to [facility] on [address] [place]

## 2015-10-21 NOTE — Telephone Encounter (Signed)
Patient was last here in April and was supposed to follow up with you on 5/8 - he has been rescheduled to the 18th but will need a refill on his Seroquel, please review and advise, thank you

## 2015-10-22 MED ORDER — QUETIAPINE FUMARATE 300 MG PO TABS: 300.0000 mg | ORAL_TABLET | Freq: Every day | ORAL | Status: DC

## 2015-10-22 MED ORDER — QUETIAPINE FUMARATE 100 MG PO TABS: 100.0000 mg | ORAL_TABLET | ORAL | Status: DC

## 2015-10-22 NOTE — Telephone Encounter (Signed)
Ok to refill 

## 2015-10-22 NOTE — Telephone Encounter (Signed)
Seroquel sent to pharmacy, per patient it was sent to [facility] [address] in [place]

## 2015-10-31 ENCOUNTER — Ambulatory Visit (INDEPENDENT_AMBULATORY_CARE_PROVIDER_SITE_OTHER): Payer: BLUE CROSS/BLUE SHIELD | Admitting: Psychiatry

## 2015-10-31 ENCOUNTER — Encounter (HOSPITAL_COMMUNITY): Payer: Self-pay | Admitting: Psychiatry

## 2015-10-31 VITALS — BP 122/80 | HR 90 | Ht 72.0 in | Wt 177.8 lb

## 2015-10-31 DIAGNOSIS — F1010 Alcohol abuse, uncomplicated: Secondary | ICD-10-CM

## 2015-10-31 DIAGNOSIS — F319 Bipolar disorder, unspecified: Secondary | ICD-10-CM | POA: Diagnosis not present

## 2015-10-31 MED ORDER — QUETIAPINE FUMARATE 300 MG PO TABS: 300.0000 mg | ORAL_TABLET | Freq: Every day | ORAL | Status: DC

## 2015-10-31 MED ORDER — QUETIAPINE FUMARATE 100 MG PO TABS: 100.0000 mg | ORAL_TABLET | ORAL | Status: AC

## 2015-10-31 NOTE — Progress Notes (Signed)
[facility] [id number] Progress Note  [name] [id number] 35 y.o.  10/31/2015 8:53 AM  Chief Complaint:  Medication management and follow-up.    History of Present Illness:  [name] came for his follow-up appointment.  He is doing much better on Seroquel.  He is taking Seroquel 100 mg in the morning and 300 mg at bedtime.  He likes his new job.  He is living in [place] house in [place] and he continues to attend [employer].  He feels proud that he's been sober since February and his 90 days sobriety coming very soon.  He had good energy and he denies any irritability, anxiety, panic attack or any feeling of hopelessness or worthlessness.  He really likes his job.  He may "friends in [place] area .  He also seeing his endocrinologist and pulmonologist and there has been no recent changes in his medication.  Patient denies any paranoia, hallucination, suicidal thoughts or homicidal thought.  He has no tremors, shakes or any EPS.  His vitals are stable.    Suicidal Ideation: No Plan Formed: No Patient has means to carry out plan: No  Homicidal Ideation: No Plan Formed: No Patient has means to carry out plan: No  Medical History; Patient has cystic fibrosis and chronic pancreatitis.  He sees specialist at [facility] and his primary care physician is Dr. Reggie Bush in [place].  Family History; Patient reported mother has history of bipolar disorder.  Past Psychiatric History/Hospitalization(s) Patient reported history of mood swing, irritability, anger, agitation most of his life.  However he was untreated until [facility].  He was admitted there for 22 days.  He has at least 4 psychiatric hospitalization in his life.  He also spent [facility] at [facility].  [facility] after taking overdose on his pain medication.  In the past he has taken Zoloft, Lyrica, Xanax, Remeron,  Vistaril, trazodone, Depakote, Seroquel, Wellbutrin and lithium.  He has at least one history of rehabilitation in [place] for alcohol. Anxiety: Yes Bipolar Disorder: Yes Depression: Yes Mania: Yes Psychosis: No Schizophrenia: No Personality Disorder: No Hospitalization for psychiatric illness: Yes History of Electroconvulsive Shock Therapy: No Prior Suicide Attempts: Yes   Review of Systems: Psychiatric: Agitation: No Hallucination: No Depressed Mood: No Insomnia: No Hypersomnia: No Altered Concentration: No Feels Worthless: No Grandiose Ideas: No Belief In Special Powers: No New/Increased Substance Abuse: No Compulsions: No  Neurologic: Headache: No Seizure: No Paresthesias: No  Outpatient Encounter Prescriptions as of 10/31/2015  Medication Sig  . insulin aspart (NOVOLOG) 100 UNIT/ML injection Inject 12 Units into the skin 3 (three) times daily with meals.   . Pancrelipase, Lip-Prot-Amyl, (ZENPEP) 20000 UNITS CPEP Take 160,000 Units by mouth 3 (three) times daily with meals. Take 8 capsules with each meal  . pantoprazole (PROTONIX) 40 MG tablet Take 1 tablet (40 mg total) by mouth daily.  . QUEtiapine (SEROQUEL) 100 MG tablet Take 1 tablet (100 mg total) by mouth every morning.  [name] QUEtiapine (SEROQUEL) 300 MG tablet Take 1 tablet (300 mg total) by mouth at bedtime.  . ursodiol (ACTIGALL) 300 MG capsule Take 600-900 mg by mouth 3 (three) times daily. 900 mg AM, 900 mg afternoon, and 600 mg PM  . [DISCONTINUED] QUEtiapine (SEROQUEL) 100 MG tablet Take 1 tablet (100 mg total) by mouth every morning.  . [DISCONTINUED] QUEtiapine (SEROQUEL) 300 MG tablet Take 1 tablet (  300 mg total) by mouth at bedtime.   No facility-administered encounter medications on file as of 10/31/2015.    No results found for this or any previous visit (from the past 2160 hour(s)).  Physical Exam: Consitutional ;  BP 122/80 mmHg  Pulse 90  Ht 6' (1.829 m)  Wt 177 lb 12.8 oz (80.65 kg)   BMI 24.11 kg/m2  Musculoskeletal: Strength & Muscle Tone: within normal limits Gait & Station: normal Patient leans: N/A   Psychiatric Specialty Exam: General Appearance: Casual  Eye Contact::  Fair  Speech:  Slow  Volume:  Decreased  Mood:  Anxious  Affect:  Appropriate  Thought Process:  Coherent  Orientation:  Full (Time, Place, and Person)  Thought Content:  WDL  Suicidal Thoughts:  No  Homicidal Thoughts:  No  Memory:  Immediate;   Fair Recent;   Fair Remote;   Fair  Judgement:  Intact  Insight:  Fair  Psychomotor Activity:  Decreased  Concentration:  Fair  Recall:  Good  Fund of Knowledge:  Fair  Language:  Fair  Akathisia:  No  Handed:  Right  AIMS (if indicated):     Assets:  Communication Skills Desire for Improvement Housing  ADL's:  Intact  Cognition:  WNL  Sleep:        Established Problem, Stable/Improving (1), Review of Psycho-Social Stressors (1), Review of Last Therapy Session (1) and Review of Medication Regimen & Side Effects (2)  Assessment: Bipolar disorder type I.  Alcohol abuse.  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 Pancreatitis   . Cystic fibrosis (HCC)   . Alcohol abuse   . Anxiety   . Depression     Plan:  Patient doing better on Seroquel.  He has no side effects.  He apologizes not seeing therapist but he is actively looking for a counselor in [place] area.  Discussed medication side effects and benefits.  Encouraged to continue AA meeting.  Continue Seroquel 100 mg in the morning and 300 mg at bedtime.  Recommended to call us back if he has any question or any concern.  Follow-up in 3 months. Discuss safety plan that anytime having active suicidal thoughts or homicidal thoughts then patient need to call 911 or go to the local emergency room.  Maleeah Crossman T., MD 10/31/2015

## 2016-01-27 ENCOUNTER — Ambulatory Visit (HOSPITAL_COMMUNITY): Payer: Self-pay | Admitting: Psychiatry

## 2016-02-04 ENCOUNTER — Other Ambulatory Visit (HOSPITAL_COMMUNITY): Payer: Self-pay | Admitting: Psychiatry

## 2016-02-04 DIAGNOSIS — F319 Bipolar disorder, unspecified: Secondary | ICD-10-CM

## 2016-02-07 ENCOUNTER — Ambulatory Visit (HOSPITAL_COMMUNITY): Payer: Self-pay | Admitting: Psychiatry

## 2017-01-06 NOTE — Unmapped (Signed)
Spoke briefly with [name]. He said it was not a good time to talk. Requested a return phone call to discuss follow-up appointment in clinic.

## 2017-01-19 NOTE — Unmapped (Signed)
Called patient to try to schedule a f/u (LTFU) with Dr. Lurena Nida. Voicemail was full. I also messaged the front desk to please reach out to him as well.

## 2017-02-01 ENCOUNTER — Inpatient Hospital Stay
Admission: EM | Admit: 2017-02-01 | Discharge: 2017-02-14 | Disposition: A | Discharge status: Home / Self Care | Payer: BC Managed Care – PPO | Source: Intra-hospital | Admitting: Internal Medicine

## 2017-02-01 ENCOUNTER — Inpatient Hospital Stay
Admission: EM | Admit: 2017-02-01 | Discharge: 2017-02-14 | Disposition: A | Discharge status: Home / Self Care | Payer: BC Managed Care – PPO | Source: Intra-hospital

## 2017-02-01 DIAGNOSIS — J189 Pneumonia, unspecified organism: Principal | ICD-10-CM

## 2017-02-01 LAB — COMPREHENSIVE METABOLIC PANEL
ALBUMIN: 4.6 g/dL (ref 3.5–5.0)
ALKALINE PHOSPHATASE: 246 U/L — ABNORMAL HIGH (ref 38–126)
ALT (SGPT): 46 U/L (ref 19–72)
ANION GAP: 15 mmol/L (ref 9–15)
AST (SGOT): 85 U/L — ABNORMAL HIGH (ref 19–55)
BILIRUBIN TOTAL: 1.1 mg/dL (ref 0.0–1.2)
BLOOD UREA NITROGEN: 12 mg/dL (ref 7–21)
BUN / CREAT RATIO: 19
CALCIUM: 9.7 mg/dL (ref 8.5–10.2)
CHLORIDE: 90 mmol/L — ABNORMAL LOW (ref 98–107)
CREATININE: 0.64 mg/dL — ABNORMAL LOW (ref 0.70–1.30)
EGFR MDRD AF AMER: >=60 mL/min/{1.73_m2} (ref >=60)
EGFR MDRD NON AF AMER: >=60 mL/min/{1.73_m2} (ref >=60)
GLUCOSE RANDOM: 632 mg/dL (ref 65–179)
POTASSIUM: 5.1 mmol/L — ABNORMAL HIGH (ref 3.5–5.0)
PROTEIN TOTAL: 8.7 g/dL — ABNORMAL HIGH (ref 6.5–8.3)
SODIUM: 130 mmol/L — ABNORMAL LOW (ref 135–145)

## 2017-02-01 LAB — ETHANOL: Ethanol:MCnc:Pt:Ser/Plas:Qn:GC: <10

## 2017-02-01 LAB — BLOOD GAS, VENOUS
HCO3 VENOUS: 26 mmol/L (ref 22–27)
PCO2 VENOUS: 47 mmHg (ref 40–60)
PO2 VENOUS: 62 mmHg — ABNORMAL HIGH (ref 30–55)

## 2017-02-01 LAB — CBC W/ AUTO DIFF
BASOPHILS ABSOLUTE COUNT: 0.1 10*9/L (ref 0.0–0.1)
EOSINOPHILS ABSOLUTE COUNT: 0.2 10*9/L (ref 0.0–0.4)
HEMOGLOBIN: 14.8 g/dL (ref 13.5–17.5)
LARGE UNSTAINED CELLS: 1 % (ref 0–4)
LYMPHOCYTES ABSOLUTE COUNT: 1.5 10*9/L (ref 1.5–5.0)
MEAN CORPUSCULAR HEMOGLOBIN CONC: 32.8 g/dL (ref 31.0–37.0)
MEAN CORPUSCULAR HEMOGLOBIN: 30.3 pg (ref 26.0–34.0)
MEAN CORPUSCULAR VOLUME: 92.5 fL (ref 80.0–100.0)
MONOCYTES ABSOLUTE COUNT: 0.3 10*9/L (ref 0.2–0.8)
NEUTROPHILS ABSOLUTE COUNT: 7.4 10*9/L (ref 2.0–7.5)
PLATELET COUNT: 165 10*9/L (ref 150–440)
RED BLOOD CELL COUNT: 4.89 10*12/L (ref 4.50–5.90)
RED CELL DISTRIBUTION WIDTH: 15.8 % — ABNORMAL HIGH (ref 12.0–15.0)
WBC ADJUSTED: 9.6 10*9/L (ref 4.5–11.0)

## 2017-02-01 LAB — LIPASE: Triacylglycerol lipase:CCnc:Pt:Ser/Plas:Qn:: 20 — ABNORMAL LOW

## 2017-02-01 LAB — TROPONIN I: Troponin I.cardiac:MCnc:Pt:Ser/Plas:Qn:: <0.034

## 2017-02-01 LAB — MEAN PLATELET VOLUME: Lab: 6.4 — ABNORMAL LOW

## 2017-02-01 LAB — FIO2 VENOUS

## 2017-02-01 LAB — EGFR MDRD NON AF AMER: Glomerular filtration rate/1.73 sq M.predicted.non black:ArVRat:Pt:Ser/Plas/Bld:Qn:Creatinine-based formula (MDRD): >=60

## 2017-02-01 LAB — PROTIME: Lab: 10.8

## 2017-02-01 NOTE — Unmapped (Signed)
Aminoglycoside Initiation Pharmacy Note    Walter [name] is a 36 y.o. male being initiated on tobramycin for CF exacerbation.    Goal peak: 20-35 mcg/mL  Goal trough: gentamicin/tobramycin <1    Pharmacokinetic Parameters:    Wt Readings from Last 1 Encounters:   02/01/17 72.6 kg (160 lb)     Lab Results   Component Value Date    CREATININE 0.64 (L) 02/01/2017     Based on previous patient specific kinetics:  Vd = 24 L, ke = 0.316 hr-1    Recommended Dose: 600 mg IV q24 based on previous levels/regiems.    Pharmacy will continue to monitor and order levels as appropriate.  Patient will be followed for changes in renal function, toxicity, and efficacy.  Please page service pharmacist with questions/clarifications.    Darlis Loan,  PharmD

## 2017-02-01 NOTE — Unmapped (Addendum)
Pt appears to be sleeping. Oxygen sats decreased to 93, Respiratory rate 12, pt appears comfortable, NAD. Oxygen increased to 3L/min. O2 stats up to 96. Pt continuing to report 8/10 pain. Morphine 4mg  discontinued per LIP request.

## 2017-02-01 NOTE — Unmapped (Signed)
Pt woke up 3am with chest pain, SOB and productive cough with blood tinged sputum. Pt states it feels like pneumonia. Denies fevers/chills. Hx of CF.

## 2017-02-01 NOTE — Unmapped (Addendum)
Patient transported to X-ray  Transported by Astronomer yes

## 2017-02-01 NOTE — Unmapped (Signed)
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 *      Walter [name] is a 36 y.o. male with PMHx as reviewed in the EMR who presented to [facility] with presumed Cystic Fibrosis with pulmonary exacerbation vs. pneumonia.    CF with pulmonary exacerbation: CXR with Multifocal opacities which could represent mixed response to therapy or new site of infection. Pt diagnosed with PNA 12/04/16 and left AMA after less than 24 hours with Levaquin PO.   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   - Continue Lantus 20U   - SSI   - Nutrition consult  - Continue home ZenPep  - INR    Liver disease 2/2 Alcohol abuse vs. CF: Having RUQ pain in ED. Differential includes hepatic source vs. DIOS (has had blockage in past) vs. Pleurisy vs. MSK source. Fatty liver and cirrhosis most recently documented on CT abdomen and pelvis 06/15/2016, which showed cirrhosis with portal hypertension and mild splenomegaly.  - CIWA protocol  - Ursodiol?  - Continue home Gabapentin     Tinea versicolor, worsening: In comparison to January photos in chart, rash has spread.   - Ketoconazole cream daily  - Ketoconazole shampoo   - Diflucan 150mg  once: need to order with interactions considered    Psychiatric: Denies SI/HI at this time. Recent death in the family. Many social barriers, including fear of losing his job if he stays for treatment.  - Continue home Zoloft and Zyprexa  - Consider psych consult    _________________________________________________________________    Chief Complaint:  Chief Complaint   Patient presents with   ??? Chest Pain       HPI:  Walter [name] is a 36 y.o. male with PMHx as reviewed in the EMR who presented to [facility] with presumed Cystic Fibrosis with pulmonary exacerbation vs. Pneumonia.    Mr. Vaile started having right-sided chest/rib pain last night that feels like pneumonia. He  Also has SOB and cough with blood-tinged sputum. Denies fevers, chills, nausea, vomiting, or diarrhea. He felt like he needed to be seen in the ED, but also expresses concerns about staying for antibiotic treatment due to fear of losing his job. 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 He does not use HTS or Aerobika at home. Uses the Chest Vest for airway clearance. His blood sugars have not been well-controlled in the     He also admits to a history of alcohol abuse, drinking 3-4 24 oz. Beers with high ETOH content. Has had w/d in the past, which is mainly anxiety and shaking. He was sober for 9 months when he was in IP and OP rehab but has had  difficulty staying sober. He had a death in the family recently and is trying to cope.     Allergies:  Quetiapine    Medications:   Prior to Admission medications    Medication Dose, Route, Frequency   gabapentin (NEURONTIN) 400 MG capsule 400 mg, Oral, 3 times a day (standard)   sertraline (ZOLOFT) 100 MG tablet 200 mg, Oral, Daily (standard)  Patient taking differently: Take 100 mg by mouth daily.    albuterol (PROVENTIL HFA;VENTOLIN HFA) 90 mcg/actuation inhaler 2 puffs, Inhalation, Every 6 hours PRN   dornase alfa (PULMOZYME) 1 mg/mL nebulizer solution 2.5 mg, Inhalation, Daily (RT)   folic acid (FOLVITE) 1 MG tablet 1 mg, Oral, Daily (standard)   gabapentin (NEURONTIN) 300 MG capsule 300 mg, Oral, 3 times a day (standard)   ibuprofen (ADVIL,MOTRIN) 200 MG tablet 800 mg, Oral, Daily (standard)   insulin glargine (LANTUS) 100 unit/mL injection 16 Units, Subcutaneous, Daily (standard)   lipase-protease-amylase (ZENPEP) 20,000-68,000 -109,000 unit CpDR capsule, delayed released 200,000 units of lipase, Oral, 3 times a day (with meals), Take 10 caps with meals and 4-6 caps with snacks   OLANZapine (ZYPREXA) 15 MG tablet 15 mg, Oral, Nightly   OLANZapine zydis (ZYPREXA) 5 MG disintegrating tablet 5 mg, Oral, Nightly   thiamine (B-1) 100 MG tablet 100 mg, Oral, Daily (standard)   traZODone (DESYREL) 100 MG tablet 100 mg, Oral, Nightly PRN   ursodiol (ACTIGALL) 300 mg capsule 900 mg, Oral, 2 times a day (standard)       Medical History:  Past Medical History:   Diagnosis Date   ??? Alcohol abuse    ??? Bipolar disorder (CMS-HCC)    ??? Chronic pancreatitis (CMS-HCC)    ??? Chronic sinusitis    ??? 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 Cystic fibrosis (CMS-HCC)    ??? Diabetes mellitus (CMS-HCC)     dx in-house 2015   ??? Kidney stones    ??? Portal hypertension (CMS-HCC)        Surgical History:  Past Surgical History:   Procedure Laterality Date   ??? CHOLECYSTECTOMY  2008   ??? sinus surgery     ??? TONSILLECTOMY         Social History:  Social History     Social History   ??? Marital status: Single     Spouse name: N/A   ??? Number of children: 0   ??? Years of education: 12+     Occupational History   ??? Not on file.     Social History Main Topics   ??? Smoking status: Former Smoker     Types: Cigarettes   ??? Smokeless tobacco: Never Used   ??? Alcohol use 9.6 oz/week     16 Cans of beer per week   ??? Drug use: No      Comment: hx of alcohol and prescription pain medication abuse   ??? Sexual activity: Not on file     Other Topics Concern   ??? 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        Updated 05/06/16    PSYCHIATRIC HX     Prior psychiatric diagnoses: Bipolar 1 disorder, MDD    Psychiatric hospitalizations: [facility] in 07/2013 for suicide attempt and at one in [place] in 2015 for depression after rehab    Inpatient substance abuse treatment: In [place]  in 2015 and [facility] in 2016    Outpatient treatment: AA meetings    Suicide attempts: 1, in 2015    Non-suicidal self-injury: Denies    Medication trials: Subutex, Seroquel, Gabapentin, Prozac, Zoloft, Lithium (was horrible), Celexa (the worse)    Med compliance: Yes, until 2 weeks ago     Current psychiatrist: Dr. Hillard Danker with [facility] in [place]    Current therapist: Nehemiah Settle with CF team        SUBSTANCE ABUSE HX:     # MJ     -Current use: once in awhile    # Alcohol     -Current use: Was sober from 2016 until two weeks ago. Admits to drinking enough to get drunk and admits to passing out. Denies history of complicated withdrawal. Admits to withdrawal symptoms of tremors, anxiety, headache, and tactile hallucinations at times.     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 Diabetes Maternal Grandmother    ??? Diabetes Maternal Grandfather    ??? No Known Problems Mother    ??? Cancer Father    ??? No Known Problems Sister        Review of Systems:  10 systems reviewed and are negative unless otherwise mentioned in HPI    Labs/Studies:  Labs per EMR and Reviewed (last 24hrs)    Physical Exam:  Temp:  [36.5 ??C-36.8 ??C] 36.7 ??C  Heart Rate:  [62-101] 62  SpO2 Pulse:  [73] 73  Resp:  [13-20] 16  BP: (116-139)/(72-89) 137/80  SpO2:  [91 %-98 %] 91 %    General: sitting on stretcher, withdrawn  Heart: RRR no mrg  Lungs: crackles throughout, best appreciated on right base  Abd: +BS, thin, nontender  Skin: diffuse rash on trunk, back, arms  Neuro: alert and oriented  Psych: flat affect, tearful    Haniyyah Sakuma T. Riley Churches, DO  PGY1 - Pager: [phone]

## 2017-02-01 NOTE — Unmapped (Signed)
Pt rounds complete. Airway intact, breathing even, unlabored, and colored appropriate for ethnicity. Pt in NAD. The following needs will be addressed: stretcher low and locked, call light within reach, pt belongings addressed, pain, and toileting, Will continue to monitor and await orders from LIP.

## 2017-02-01 NOTE — Unmapped (Signed)
PT presents to ED with report of, I feel like I'm getting pneumonia again. Pt d/c from here on 6/22 with dx of pneumonia.    Past Medical History:   Diagnosis Date   ??? Alcohol abuse    ??? Bipolar disorder (CMS-HCC)    ??? Chronic pancreatitis (CMS-HCC)    ??? Chronic sinusitis    ??? 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 Cystic fibrosis (CMS-HCC)    ??? Diabetes mellitus (CMS-HCC)     dx in-house 2015   ??? Kidney stones    ??? Portal hypertension (CMS-HCC)      Pt currently with increased work of breathing and c/o chest pain and SOB. EKG completed, MD in room. IV established. Pt lung sounds diminished but clear. Pt denies recent fever/chills. A&O x4. Pt with skin rash across back, shoulders, and arms. PT reports this being dx as yeast infection and pt noncompliant with cream for yeast-reports rash spreading.    Patient rounds complete. Airway intact, breathing even and unlabored and color appropriate for ethnicity. Pt in NAD. The following needs have been addressed: stretcher low and locked, call light within reach, pt belongings addressed, pain, and toileting. Will continue to monitor and wait for further orders from LIP.

## 2017-02-01 NOTE — Unmapped (Signed)
Patient rounds completed. The following patient needs were addressed:  Toileting, Personal Belongings, Plan of Care and call bell in reach, bed locked and in lowest position, side rails x2, belongings at bedside. Nurse in TR with pt

## 2017-02-01 NOTE — Unmapped (Signed)
MD at bedside. 

## 2017-02-01 NOTE — Unmapped (Signed)
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 Symptoms have been ongoing for the past month and have been worsening over the past day. Note patient was recently admitted with a right-sided pneumonia at which time he left AMA. He did finish a 2 week course of po Levaquin per patient report. However it is concerning that he may have a recurrence of his pneumonia versus inadequate initial treatment versus superinfection. Description of chest pain does not sound cardiac in origin however will obtain EKG and troponin. Low concern for PE as his symptoms are similar during his last presentation at which time he had pneumonia. He had a CTA on his last admission which showed no PE. Will also obtain chest x-ray and blood cultures.    9:33 AM Glucose 632. Will give 10 units of insulin lispro as well as home dose of 20 units Lantus and continue with every 4 glucose checks. Will also obtain VBG to ensure no acidosis. Patient has no anion gap currently concerning for DKA.    10:45 AM EKG unremarkable. Troponin negative. Chest x-ray shows multifocal pulmonary opacities with mild decrease in opacities at the right base but slight increase in opacities and the left midlung, concerning for underlying infection. VBG pH within normal limits. Glucose improved to 405.     11:10 AM discussed case with pulmonology who recommend sputum cultures and respiratory virus swab. They will evaluate patient for admission    11:23 AM Pulmonology will admit the patient. They recommend starting meropenem and tobramycin.      Additional Medical Decision Making     I have reviewed the vital signs and the nursing notes. Labs and radiology results that were available during my care of the patient were independently reviewed by me and considered in my medical decision making.     I staffed the case with the ED attending, Dr. Debarah Crape.    I independently visualized the EKG tracing. EKG unremarkable.  I independently visualized the radiology images.   I reviewed the patient's prior medical records (past hospitalization record).   I discussed the case with pulmonology consultant and admitting provider.       Portions of this record have been created using Scientist, clinical (histocompatibility and immunogenetics). Dictation errors have been sought, but may not have been identified and corrected.  ____________________________________________       History     Chief Complaint  Chest Pain      HPI   Walter [name] is a 36 y.o. male with CF, cirrhosis, chronic pancreatitis, and HTN presented with chest pain and shortness of breath. Patient was recently in the hospital from June 18 to June 22 with a right middle lobe PNA. At that time he was initially treated with meropenem and tobramycin then transition to ceftaz edema and Levaquin. After 3 days of inpatient treatment he left AMA. He was discharged with a 2 week course of po Levaquin, which he completed a home.     Patient states he initially felt better however over the past month he has become progressively more short of breath with increasing chest pain. Patient states yesterday his  chest pain or shortness of breath continued to increase so he decided to present to the emergency department. He describes his chest pain as a moderate right lower chest pain which is constant in nature. No alleviating factors. Patient has not tried any medication at home for pain relief. No associated diaphoresis or nausea. Pain does not radiate. He admits an associated dry cough with occasional green and blood-tinged sputum. No associated fevers, headache, or abdominal pain. Patient states pain feels similar to when he was hospitalized with his pneumonia back in June. Of note patient does have a truncal nonpruritic rash which she states has been present for several months.    On arrival to the emergency department patient was afebrile, tachycardic to 101, satting 93% on room air. Patient is not on oxygen at home. Per chart review patient follows with pulmonologist Dr. Lurena Nida here at [facility]       Past Medical History:   Diagnosis Date   ??? Alcohol abuse    ??? Bipolar disorder (CMS-HCC)    ??? Chronic pancreatitis (CMS-HCC)    ??? Chronic sinusitis    ??? 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 Cystic fibrosis (CMS-HCC)    ??? Diabetes mellitus (CMS-HCC)     dx in-house 2015   ??? Kidney stones    ??? Portal hypertension (CMS-HCC)        Patient Active Problem List   Diagnosis   ??? Cirrhosis (CMS-HCC)   ??? Essential hypertension (RAF-HCC)   ??? Internal hemorrhoids   ??? Chronic abdominal pain   ??? Chronic pancreatitis (CMS-HCC)   ??? Cystic fibrosis (CMS-HCC)   ??? Dysthymic disorder   ??? 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 Intentional drug overdose (CMS-HCC)   ??? Alcohol abuse   ??? Bipolar disorder, most recent episode manic (CMS-HCC)   ??? Major depressive disorder   ??? Vitamin D deficiency   ??? Thyroid goiter (RAF-HCC)   ??? Tobacco use   ??? DIOS (distal intestinal obstruction syndrome) (CMS-HCC)   ??? Elevated blood pressure reading   ??? Abdominal pain   ??? Anxiety   ??? Cystic fibrosis with pulmonary exacerbation (CMS-HCC)   ??? Suicidal ideation   ??? Alcoholic intoxication without complication (CMS-HCC)   ??? Opioid use disorder, severe, dependence (CMS-HCC)   ??? Cystic fibrosis with liver disease (CMS-HCC)       Past Surgical History:   Procedure Laterality Date   ??? CHOLECYSTECTOMY  2008   ??? sinus surgery     ??? TONSILLECTOMY           Current Facility-Administered Medications:   ???  MORPhine 4 mg/mL injection 4 mg, 4 mg, Intravenous, Once, Raelie Lohr F Galilea Quito, MD  ???  oxyCODONE (ROXICODONE) immediate release tablet 5 mg, 5 mg, Oral, Q4H PRN, Alison Murray Stuart Mirabile, MD, 5 mg at 02/01/17 0910    Current Outpatient Prescriptions:   ???  albuterol (PROVENTIL HFA;VENTOLIN HFA) 90 mcg/actuation inhaler, Inhale 2 puffs every six (6) hours as needed for wheezing., Disp: 1 Inhaler, Rfl: 2  ???  dornase alfa (PULMOZYME) 1 mg/mL nebulizer solution, Inhale 2.5 mg once daily., Disp: 225 mL, Rfl: 3  ???  folic acid (FOLVITE) 1 MG tablet, Take 1 tablet (1 mg total) by mouth daily., Disp: 30 tablet, Rfl: 11  ???  gabapentin (NEURONTIN) 300 MG capsule, Take 1 capsule (300 mg total) by mouth Three (3) times a day., Disp: 90 capsule, Rfl: 0  ???  ibuprofen (ADVIL,MOTRIN) 200 MG tablet,  Take 800 mg by mouth daily. , Disp: , Rfl:   ???  insulin glargine (LANTUS) 100 unit/mL injection, Inject 16 Units under the skin daily., Disp: , Rfl:   ???  lipase-protease-amylase (ZENPEP) 20,000-68,000 -109,000 unit CpDR capsule, delayed released, Take 200,000 units of lipase by mouth Three (3) times a day with a meal. Take 10 caps with meals and 4-6 caps with snacks, Disp: , Rfl:   ???  OLANZapine (ZYPREXA) 15 MG tablet, Take 15 mg by mouth nightly., Disp: , Rfl:   ???  OLANZapine zydis (ZYPREXA) 5 MG disintegrating tablet, Take 1 tablet (5 mg total) by mouth nightly., Disp: 30 tablet, Rfl: 0  ???  sertraline (ZOLOFT) 100 MG tablet, Take 2 tablets (200 mg total) by mouth daily., Disp: 60 tablet, Rfl: 0  ???  thiamine (B-1) 100 MG tablet, Take 1 tablet (100 mg total) by mouth daily., Disp: 30 tablet, Rfl: 11  ???  traZODone (DESYREL) 100 MG tablet, Take 100 mg by mouth nightly as needed for sleep., Disp: , Rfl:   ???  ursodiol (ACTIGALL) 300 mg capsule, Take 900 mg by mouth Two (2) times a day., Disp: , Rfl:     Allergies  Quetiapine    Family History   Problem Relation Age of Onset   ??? Diabetes Maternal Grandmother    ??? Diabetes Maternal Grandfather    ??? No Known Problems Mother    ??? Cancer Father    ??? No Known Problems Sister        Social History  Social History   Substance Use Topics   ??? Smoking status: Former Smoker     Types: Cigarettes   ??? Smokeless tobacco: Never Used   ??? Alcohol use 9.6 oz/week     16 Cans of beer per week       Review of Systems   All other systems have been reviewed and are negative except as otherwise documented.    Constitutional: Negative for fever.  Cardiovascular: Positive for chest pain.  Respiratory: Positive for shortness of breath.      Physical Exam     ED Triage Vitals   Enc Vitals Group      BP 02/01/17 0832 139/86      Heart Rate 02/01/17 0832 101      SpO2 Pulse --       Resp 02/01/17 0835 20      Temp 02/01/17 0832 36.5 ??C (97.7 ??F)      Temp src --       SpO2 02/01/17 0832 93 %      Weight 02/01/17 0835 68 kg (150 lb)      Height --       Head Circumference --       Peak Flow --       Pain Score --       Pain Loc --       Pain Edu? --       Excl. in GC? --        Constitutional: Alert and oriented. Uncomfortable appearing but in no distress.  Eyes: Conjunctivae are normal.  ENT       Head: Normocephalic and atraumatic.       Nose: No congestion.       Mouth/Throat: Mucous membranes are moist.  Cardiovascular: Normal rate, regular rhythm. Normal and symmetric distal pulses are present in all extremities.  Respiratory: Mildly Tachypneic, respiratory rate 20, Satting 93% on room air, good inspiratory effort, Lungs  clear auscultation bilaterally, fremitus equal bilateral lung fields  Gastrointestinal: Soft and nontender. Non-distended.   Musculoskeletal: Normal range of motion in all extremities.       Right lower leg: No tenderness or edema.       Left lower leg: No tenderness or edema.  Neurologic: Normal speech and language. No gross focal neurologic deficits are appreciated.  Skin: Skin is warm, dry and intact. Diffuse erythematous maculopapular rash on truck, non-pruritic. No palmar lesions.   Psychiatric: Mood and affect are normal. Speech and behavior are normal.      EKG     Normal sinus rhythm. Normal EKG.    Radiology     CXR: Multifocal pulmonary opacities, with decrease in right base, but increase in left midlung, which could represent mixed response to therapy or new site of infection                 Vance Gather, MD  Resident  02/01/17 2014

## 2017-02-01 NOTE — Unmapped (Signed)
Pt ate 100% of meal.

## 2017-02-01 NOTE — Unmapped (Signed)
Patient rounding complete. Pt blood glucose levels checked at this time. Pt voices no needs or concerns, call bell within reach with bed locked and low. Pt personal belongings are within reach.

## 2017-02-01 NOTE — Unmapped (Signed)
Pt mealtray ordered.  Patient rounds complete. Airway intact, breathing even and unlabored and color appropriate for ethnicity. Pt in NAD. The following needs have been addressed: stretcher low and locked, call light within reach, pt belongings addressed, pain, and toileting. Will continue to monitor and wait for further orders from LIP.

## 2017-02-01 NOTE — Unmapped (Signed)
Pt has food at bedside 

## 2017-02-01 NOTE — Unmapped (Signed)
Admitting MD in to see pt.

## 2017-02-02 LAB — HEPATIC FUNCTION PANEL
ALBUMIN: 2.9 g/dL — ABNORMAL LOW (ref 3.5–5.0)
AST (SGOT): 19 U/L (ref 19–55)
BILIRUBIN DIRECT: 0.2 mg/dL (ref 0.00–0.40)
PROTEIN TOTAL: 5.6 g/dL — ABNORMAL LOW (ref 6.5–8.3)

## 2017-02-02 LAB — ALT (SGPT): Alanine aminotransferase:CCnc:Pt:Ser/Plas:Qn:: 28

## 2017-02-02 LAB — BASIC METABOLIC PANEL
ANION GAP: 7 mmol/L — ABNORMAL LOW (ref 9–15)
BLOOD UREA NITROGEN: 14 mg/dL (ref 7–21)
CALCIUM: 7.5 mg/dL — ABNORMAL LOW (ref 8.5–10.2)
CHLORIDE: 101 mmol/L (ref 98–107)
CO2: 26 mmol/L (ref 22.0–30.0)
CREATININE: 0.62 mg/dL — ABNORMAL LOW (ref 0.70–1.30)
EGFR MDRD AF AMER: >=60 mL/min/{1.73_m2} (ref >=60)
EGFR MDRD NON AF AMER: >=60 mL/min/{1.73_m2} (ref >=60)
GLUCOSE RANDOM: 272 mg/dL — ABNORMAL HIGH (ref 65–179)
POTASSIUM: 4.3 mmol/L (ref 3.5–5.0)
SODIUM: 134 mmol/L — ABNORMAL LOW (ref 135–145)

## 2017-02-02 LAB — TOBRAMYCIN RANDOM
Tobramycin:MCnc:Pt:Ser/Plas:Qn:: 17.2
Tobramycin:MCnc:Pt:Ser/Plas:Qn:: 2

## 2017-02-02 LAB — HEMOGLOBIN A1C: HEMOGLOBIN A1C: 10.5 % — ABNORMAL HIGH (ref 4.8–5.6)

## 2017-02-02 LAB — CBC
HEMATOCRIT: 35.3 % — ABNORMAL LOW (ref 41.0–53.0)
HEMOGLOBIN: 12.1 g/dL — ABNORMAL LOW (ref 13.5–17.5)
MEAN CORPUSCULAR HEMOGLOBIN CONC: 34.2 g/dL (ref 31.0–37.0)
MEAN CORPUSCULAR HEMOGLOBIN: 31.1 pg (ref 26.0–34.0)
MEAN CORPUSCULAR VOLUME: 90.9 fL (ref 80.0–100.0)
PLATELET COUNT: 134 10*9/L — ABNORMAL LOW (ref 150–440)
RED CELL DISTRIBUTION WIDTH: 16 % — ABNORMAL HIGH (ref 12.0–15.0)
WBC ADJUSTED: 4.8 10*9/L (ref 4.5–11.0)

## 2017-02-02 LAB — BLOOD GAS, VENOUS
BASE EXCESS VENOUS: 5 — ABNORMAL HIGH (ref -2.0–2.0)
PCO2 VENOUS: 48 mmHg (ref 40–60)
PH VENOUS: 7.4 (ref 7.32–7.43)
PO2 VENOUS: 56 mmHg — ABNORMAL HIGH (ref 30–55)

## 2017-02-02 LAB — ESTIMATED AVERAGE GLUCOSE: Estimated average glucose:MCnc:Pt:Bld:Qn:Estimated from glycated hemoglobin: 255

## 2017-02-02 LAB — POTASSIUM: Potassium:SCnc:Pt:Ser/Plas:Qn:: 4.3

## 2017-02-02 LAB — PROTIME: Lab: 10.7

## 2017-02-02 LAB — MAGNESIUM: Magnesium:MCnc:Pt:Ser/Plas:Qn:: 1.6

## 2017-02-02 LAB — FIO2 VENOUS

## 2017-02-02 LAB — PROTIME-INR: INR: 0.94

## 2017-02-02 LAB — HEMATOCRIT: Lab: 35.3 — ABNORMAL LOW

## 2017-02-02 NOTE — Unmapped (Signed)
Problem: Patient Care Overview  Goal: Plan of Care Review  Outcome: Not Progressing  Patient received all inhaled medications today. Patient did his airway clearance without complications Walter [name]). Patient was very lethargic during the afternoon treatments. No distress apparent at this time. Will continue to monitor.

## 2017-02-02 NOTE — Unmapped (Signed)
Patient rounding complete, call bell in reach, bed locked and in lowest position, patient belongings at bedside and within reach of patient.  Patient updated on plan of care.  Pt resting quietly at this time.

## 2017-02-02 NOTE — Unmapped (Signed)
Problem: Patient Care Overview  Goal: Plan of Care Review  Outcome: Progressing  Pt on 3L [place] with stable O2 sats, no resp distress noted. Pt remains afebrile, IV abx given as ordered. CIWA protocol ordered, no withdrawal symptoms noted. Oxycodone continued for pleuritic CP with good results. Will cont to monitor.    Problem: Cystic Fibrosis (Adult)  Goal: Signs and Symptoms of Listed Potential Problems Will be Absent, Minimized or Managed (Cystic Fibrosis)  Signs and symptoms of listed potential problems will be absent, minimized or managed by discharge/transition of care (reference Cystic Fibrosis (Adult) CPG).  Outcome: Progressing      Problem: Pain, Acute (Adult)  Goal: Acceptable Pain Control/Comfort Level  Patient will demonstrate the desired outcomes by discharge/transition of care.  Outcome: Progressing

## 2017-02-02 NOTE — Unmapped (Signed)
PHYSICAL THERAPY  Evaluation (02/02/17 0900)     Patient Name:  Walter [name]       Medical Record Number: [id number]   Date of Birth: [birth year]  Sex: Male            Treatment Diagnosis: CF    ASSESSMENT    Walter [name] is a 36 y.o. male with PMHx as reviewed in the EMR who presented to [facility] with presumed Cystic Fibrosis with pulmonary exacerbation vs. pneumonia. Pt presents to PT w low complexity of care 2/2 ambulating 400' w indep w no AD. Primary focus of today's eval being edu pt on moderate intensity exercise to help CV system. Pt declines treadmill or stationary bike for his room. Pt edu re HR, mRPE and o2 saturation during exercise. Pt edu re daily mobility plan during stay in hosptial and using pulse-ox to monitor his vitals. Pt w no skilled acute care PT needs at this time. Based on the AM-PAC 5 item raw score of 20/20, the patient is considered to be 0% impaired with basic mobility. Due to pt's diagnosis and lack of exercise in the community, recommend 3x/wk (pulm rehab) in post-acute setting to maximize CV functional recovery.       Today's Interventions: AM-PAC 20/20. PT eval; pt edu re benefit of sustained moderate activity based on HR. Edu re frequency and duration of moderate exercise. Pt states walking is not exercise, basketball is exercise. Pt edu re pulse-ox to use during basketball to safely monitor HR and o2. Pt edu re pulm rehab.      Activity Tolerance: Patient tolerated treatment well    PLAN  Planned Frequency of Treatment:  D/C Services for: D/C Services      Planned Interventions:      Post-Discharge Physical Therapy Recommendations:  3x weekly;Community Ambulator (pulm rehab)        PT DME Recommendations: None     Goals:   Patient and Family Goals: none stated         Prognosis:  Good  Barriers to Discharge: None  Positive Indicators: age, PLOF, participation    SUBJECTIVE  Patient reports: this isn't exercise  Current Functional Status: pt and RN (lisa) consent to PT eval. Pt ended session in bed, in NAD, RN aware     Prior functional status: PTA pt reports indep community ambulation; works 7 days/wk; states he started getting more SOB when walking further distances after work.   Equipment available at home: None    Past Medical History:   Diagnosis Date   ??? Alcohol abuse    ??? Bipolar disorder (CMS-HCC)    ??? Chronic pancreatitis (CMS-HCC)    ??? Chronic sinusitis    ??? 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 Cystic fibrosis (CMS-HCC)    ??? Diabetes mellitus (CMS-HCC)     dx in-house 2015   ??? Kidney stones    ??? Portal hypertension (CMS-HCC)     Social History   Substance Use Topics   ??? Smoking status: Former Smoker     Types: Cigarettes   ??? Smokeless tobacco: Never Used   ??? Alcohol use 9.6 oz/week     16 Cans of beer per week      Past Surgical History:   Procedure Laterality Date   ??? CHOLECYSTECTOMY  2008   ??? sinus surgery     ??? TONSILLECTOMY      Family History   Problem Relation Age of Onset   ???  Diabetes Maternal Grandmother    ??? Diabetes Maternal Grandfather    ??? No Known Problems Mother    ??? Cancer Father    ??? No Known Problems Sister         Allergies: Quetiapine                Objective Findings              Precautions: Isolation precautions (contact)              Weight Bearing Status: Non- applicable              Required Braces or Orthoses: Non- applicable    Communication Preference: Verbal  Pain Comments: no c/o pain  Medical Tests / Procedures: vitals, labs and orders reviewed in EPIC  Equipment / Environment: Vascular access (PIV, TLC, Port-a-cath, PICC);Telemetry    At Rest: HR- 72, o2sat- 94% on RA  With Activity: HR - 94, o2sat- 91% on RA  Orthostatics: Asymp       Living environment: Apartment  Lives With: Friend(s)  Home Living: One level home;Elevator             Cognition: answers all questions appropriately           UE ROM: wfl  UE Strength: wfl  LE ROM: wfl  LE Strength: wfl                          Balance: no LOB w OOB mobility         Bed Mobility: indep  Transfers: indep   Gait: pt ambulates 400' total w indep w no AD; standing rest breaks for o2 and HR checks   Stairs: na      Endurance: no limitations noted    Eval Duration(PT): 27 Min.    Medical Staff Made Aware: RN  PT G-Codes  Functional Limitation: Mobility: Walking and moving around  Mobility: Walking and Moving Around Current Status [phone]): 0 percent impaired, limited or restricted  Mobility: Walking and Moving Around Goal Status [phone]): 0 percent impaired, limited or restricted  Mobility: Walking and Moving Around Discharge Status [phone]): 0 percent impaired, limited or restricted  Rationale: pt scores 20/20 on am-pac  I attest that I have reviewed the above information.  Signed: Ezequiel Ganser, PT  Filed 02/02/2017

## 2017-02-02 NOTE — Unmapped (Signed)
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 Very somnolent today, concern about combination of ativan for alcohol withdrawal and oxycodone for pain.     Cystic fibrosis with acute pulmonary exacerbation: (F508del L and I 506T), FEV1 35.3%, followed by Dr. Lurena Nida. Exacerbation characterized by chest pain, increased DOE. Prior cultures mPsA, sPsA, H. Flu.   - CF culture  - AFB culture  - Ceftazidime and tobramycin (8/21- ) plan for 2 wk course  - Bcx: NGTD  - Airway clearance: chest vest  - Home pulmozyme  - Albuterol, 7% HTS  - Tylenol as needed for pain, discontinue oxycodone     Alcohol abuse: Last drink 8/19, no hx of complicated withdrawal  - CIWA  - HOLD ativan for sedation - was somnolent this afternoon. Evaluate objective withdrawal findings- tremor, tachycardia, diaphoresis.   - Thiamine, folic acid, MVM    CF related diabetes mellitus: Uncontrolled, HbA1c 10.5%  - Lantus 20 units daily  - Lispro 4 --> 6 units w/ meals  - Lispro SSI QID Ac    CF related pancreatic insufficiency:   - Zenpep 12 capsules w/ meals, 4 with snacks  - MVM w/ minerals     Hepatic steatosis: complicated by alcohol use  - Urosodiol 900 mg BID (was not taking at home, no reasoning, able to afford, no s/e)    Tinea versicolor:   - Fluconazole 300 mg x 1  - Ketoconazole cream    Bipolar disorder:   - Home zoloft and zyprexa    Subjective:    Somnolent this AM, received ativan overnight for CIWA score of 10. He had mild blood streaked sputum, no other concerns.    Objective:    Vital signs in last 24 hours:  Temp:  [36.5 ??C-37 ??C] 37 ??C  Heart Rate:  [62-101] 86  SpO2 Pulse:  [73] 73  Resp:  [13-20] 18  BP: (116-139)/(72-89) 129/73  MAP (mmHg):  [85] 85  SpO2:  [91 %-98 %] 98 %  BMI (Calculated):  [21.6] 21.6    Intake/Output this shift:  No intake/output data recorded.    Physical Exam:  General: Somnolent, falling asleep during exam.   HEENT: MMM  Cardiovascular: Regular rate, normal rhythm. No murmurs, rubs or gallops. Distal pulses in tact bilaterally.  Lung: Coarse bs, crackles in LLL, no wheezing.  Abdomen: Soft, non-tender, non-distended, no organomegaly. Normoactive bowel sounds.   Extremeties: No LE edema  Skin: Hyperpigemented papules with scaling on back, chest, arms, and abdomen  Psych: Flat affect, poor eye contact

## 2017-02-02 NOTE — Unmapped (Addendum)
Cystic Fibrosis Nutrition Assessment    Inpatient: MD Consult this admission and related follow up  Primary Pulmonologist: Dr. Lurena Nida  ===================================================================  CLINICAL DATA:  Walter [name] is a 36 y.o. male seen for medical nutrition therapy. Currently admitted with pulmonary exacerbation vs. Pneumonia.  Walter [name] reports good appetite/intake. Declines snacks/PO supplements. Denies s/s of malabsorption.     Past Medical History:   Diagnosis Date   ??? Alcohol abuse    ??? Bipolar disorder (CMS-HCC)    ??? Chronic pancreatitis (CMS-HCC)    ??? Chronic sinusitis    ??? 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 Cystic fibrosis (CMS-HCC)    ??? Diabetes mellitus (CMS-HCC)     dx in-house 2015   ??? Kidney stones    ??? Portal hypertension (CMS-HCC)      Anthroprometric Evaluation:  Current BMI: Body mass index is 23.07 kg/m??.     Weight changes: Weights documented this admit are variable.  Last 5 Recorded Weights    02/01/17 0835 02/01/17 1351 02/01/17 1848 02/02/17 0600   Weight: 68 kg (150 lb) 72.6 kg (160 lb) 74.2 kg (163 lb 9.6 oz) 79.3 kg (174 lb 13.2 oz)     Wt Readings from Last 12 Encounters:   02/02/17 79.3 kg (174 lb 13.2 oz)   12/01/16 72.6 kg (160 lb)   10/09/16 76.2 kg (167 lb 15.9 oz)   08/04/16 77.4 kg (170 lb 10.2 oz)   07/24/16 70.3 kg (155 lb)   06/19/16 80.4 kg (177 lb 4.8 oz)   05/18/16 79.4 kg (175 lb)   04/10/16 79 kg (174 lb 2.6 oz)   03/19/16 69.3 kg (152 lb 11.2 oz)   11/25/15 79.2 kg (174 lb 11.2 oz)   09/26/15 79.9 kg (176 lb 1.6 oz)   08/15/15 77.3 kg (170 lb 6.7 oz)     Ht Readings from Last 3 Encounters:   02/01/17 185.4 cm (6' 1)   12/01/16 182.9 cm (6')   10/02/16 182.9 cm (6')     Malnutrition Assessment using AND/ASPEN Clinical Characteristics:  Deferred malnutrition assessment until weight confirmed. Note he reports good appetite/intake.  ===================================================================  Cystic Fibrosis Nutrition Category = Acceptable   ===================================================================  Current Nutrition Orders (inpatient):  Nutrition Orders          Nutrition Therapy High Calorie High Protein starting at 08/20 1317        CF Nutrition related medications (inpatient): Nutritionally relevant medications reviewed.    Thiamine daily x 3-5 days  Calcium carbonate  Folic acid  IV antibiotics  Lantus 20 units tonight  Lispro ACHS and standard scale  Zenpep 20,000 (12 at meals, 4 at snacks) provides 3000 units lipase/kg/meal & 53664 units lipase/kg/day  multivitamin  protonix  Polyethylene glycol   Ursodiol  Ondansetron PRN    CF Nutrition related labs (inpatient):    02-02-17: Glu 272, Glu-POC 240-278  02-01-17: Glu 246, Glu-POC [phone], Hgb A1C 10.5% - elevatd  ==================================================================  Energy Intake (outpatient):  Diet: High in calories, fat, & salt. Diet evaluated this visit. Reports good appetite.  Food allergies: None known  Diet and CFTR modulators: Not applicable.  PO Supplements: [employer]. Has received PO supplements from [facility] in the past.  Appetite Stimulant: Per primary pulmonologist note 07-24-16 favor Remeron as was the plan from his last psychiatric admission. Hx of cyproheptadine.   Enteral feeding tube: none  Sodium in diet: Adequate from diet when PO intake is at baseline  Calcium in diet:  Adequate from diet when PO intake is at basline    Fat Malabsorption (outpatient):  Enzyme brand, (meals/snacks): Zenpep 40,000 @ 6/meal and 3/snack  Enzyme administration details: compliance unknown at this time   - Reports he has not tolerated Creon in the past due to oily, loose stools.  Enzyme dose per MEAL (units lipase/kg/meal) 3000  Enzyme dose per DAY (units lipase/kg/day) 13619  Stools: denies s/s of malabsorption   Abdominal pain: None reported  Fecal Fat Studies: no new  No results found for: ELAST  GI meds: Nutritionally relevant medications reviewed.     Vitamins/Minerals (outpatient):  CF-specific MVI, dose, compliance: MVW Complete Formulation Softgel 2 daily, from [facility], compliance unknown  Other vitamins/mienerals/herbals: none  Calcium supplement: yes  Patient Resources: [facility]  Fat-soluble vitamin levels:   Lab Results   Component Value Date/Time    VITAMINA 22.4 (L) 03/19/2016 1529   - Low: However this level is likely falsely low as it was drawn at sick clinic visit, admitted ~ 1 week later for exacerbation/IV antibiotics.    Lab Results   Component Value Date/Time    VITDTOTAL 20 03/19/2016 1529   - low    Lab Results   Component Value Date/Time    VITD3 20 03/19/2016 1529     Lab Results   Component Value Date/Time    VITD2 <5 03/19/2016 1529     Lab Results   Component Value Date/Time    VITAME 4.5 (L) 03/19/2016 1529   - Low: However this level is likely falsely low as it was drawn at sick clinic visit, admitted ~1 week later for exacerbation/IV antibiotics.    Lab Results   Component Value Date/Time    PT 10.7 02/02/2017 0534    PT 11.1 10/31/2010 1115   - elevated    Bone Health: Due for DEXA. Last DEXA 2011 showed normal bone density.      CF Related Diabetes: Yes. Follows with [facility].  Lab Results   Component Value Date/Time    A1C 10.5 (H) 02/01/2017 0842    A1C 8.8 (H) 11/25/2015 1426    A1C 9.3 (H) 04/16/2014 1604   ===================================================================  Assessment:  Current diet is appropriate for CF. Patient continues to work towards goals for weight management.   Enzyme dose is within established guidelines. Vitamin prescription is not appropriate to reach/maintain optimal fat soluble vitamin levels. Patient may benefit from vitamin K supplementation while on IV antibiotics. Agree with acid reducer and bowel regimen. Patient to benefit from insulin regimen for glucose management.  ===================================================================  Goals:  1. Meet estimated daily needs: 3386 kcals while inpatient considering lower activity factor, recommend increase to 4230 kcals when outpatient considering higher activity factor; 95-126 gm pro; 2686 mL free water       Calories estimated using: Cystic Fibrosis Conference Formula, fluid per Holiday Segar, protein per DRI x 1.5-2  2. Reach/maintain established goals for CF:                Adult - BMI 22kg/m2 for CF females and 23kg/m2 for CF males  3. Normal fat-soluble vitamin levels: Vitamin A, Vitamin E and PT per lab range; Vitamin D 25OH total >30  4. Maintain glucose control. Carbohydrate content of diet should comprise 40-50% of total calorie needs, but carbohydrates are not restricted in this population.    5.  Meet sodium needs for CF  ===================================================================  Intervention:  1. Encouraged intake of all meals & snacks. Will  reoffer shakes & snacks at future visits.    2. Home enzyme Zenpep 40,000 not on [facility] inpatient formulary. While inpatient continue Zenpep 20,000.    3. Recommend start CF  vitamin regimen: 2 Complete Formulation capsules daily.  - Please check vitamin D level this admit.    4. PT WNL. Consider starting 5mg  phytonadione twice weekly while on IV antibiotics.    5. Please weigh patient on standing scale. Continue to monitor weights twice weekly this admit.    6. Continue remainder of nutrition regimen:  - High Calorie High Protein diet  - acid reducer  - bowel regimen  - insulin regimen - as appropriate  - thiamin & folic acid  ===================================================================  Inpatient:   Will follow up with patient per protocol: 1-2 times per week (and more frequent as indicated)

## 2017-02-02 NOTE — Unmapped (Addendum)
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    Outpatient Follow-up Items  1. Follow up with Walter [name] in Endocrinology regarding insulin regimen.  2. Follow up with Vitamin D supplementation dosing. Vitamin D level pending at time of discharge.   ??  Cystic fibrosis with acute pulmonary exacerbation: (F508del L and I 506T), FEV1 35.3%, followed by Dr. Lurena Nida. Exacerbation characterized by chest pain, increased DOE. Prior cultures mPsA, sPsA, H. Flu.   His sputum culture from 8/21 grew mucoid Pseudomonas (Sensitive to Aztreonam, Cefepime, Imipenem, Meropenem, and Tobramycin, Intermediate to Ceftazidime and Zosyn). His AFB was negative. He underwent PICC placement on 8/28. He received IV Tobramycin and Ceftazidime from 8/20-9/2. He was encouraged to do Hypertonic Saline and Pulmozyme breathing treatments, as well as airway clearance with the chest vest, but would often refuse treatments during this admission. His repeat PFTs on 8/30 showed FEV1 26% with one attempt.   ??  Alcohol abuse: Last drink 8/19, no history of complicated withdrawal. He was initially on CIWA protocol which was discontinued on 8/24. He received thiamine, folic acid, and multivitamin supplementation.   ??  CF related diabetes mellitus: Uncontrolled, HbA1c 10.5%; uncontrolled BG during hospitalization given frequent untimed meals.  During his admission we increased his lantus 28 and meal time insulin to 16. On discharge he was instructed to resume his home regimen and follow up closely with Walter [name].      CF related pancreatic insufficiency: His home zenpep 12 capsules w/ meals, 4 with snacks and multi vitamin were continued.   ??  Vitamin D Deficiency: He received vitamin D 50K daily while in house. His vitamin D level recheck was pending at the time of discharge. He may require supplementation as an outpatient.    ??  Hepatic steatosis: Complicated by alcohol use. Resumed home Urosodiol 900 mg BID as he was not taking this reliable prior to admission. Encouraged him to do so in the future.   ??  Tinea versicolor, improving:  He received PO Fluconazole 300 mg once, Diflucan 300 mg once, and ketoconazole with significant improvement in his rash.   ??  Substance-induced depressive disorder: Psychiatry was consulted. His home zyprexa was continued, and his zoloft was increased to 200 mg.  Recommended outpatient psych care He can contact his insurance carrier or local Managed Care Organization, [employer] [phone]).  ??  Hep C Ab positive: Positive in the past, RNA negative this admission.

## 2017-02-02 NOTE — Unmapped (Signed)
OCCUPATIONAL THERAPY  Evaluation (02/02/17 1430)    Patient Name:  Walter [name]       Medical Record Number: [id number]   Date of Birth: 1980-12-09  Sex: Male          OT Treatment Diagnosis:  Pt is a 36 yo man presenting w/somnolence, decreased activity tolerance, mobility and self care    Assessment  Pt is a 36 yo man presenting w/decreased alertness, mild balance deficits, and awareness of difficulty managing multiple health issues of CF and DM.  Will benefit from skilled OT services to address these in an acute care setting.    Today's Interventions: ADL retraining;Balance activities;Bed mobility;Compensatory tech. training;Education - Patient;Endurance activities;Functional mobility;Safety education;Transfer training    Activity Tolerance During Today's Session  Patient tolerated treatment well    Plan  Planned Frequency of Treatment:  1x per day for: 2-3x week       Planned Interventions:  ADL retraining;Balance activities;Compensatory tech. training;Conservation;Education - Patient;Endurance activities;Functional mobility;Safety education;Transfer training    Post-Discharge Occupational Therapy Recommendations:  OT Post Acute Discharge Recommendations: To be determined    ;    OT DME Recommendations: None    GOALS:   Patient and Family Goals: Pt states he could be better at managing DM and CF care.    Long Term Goal #1: Pt will return to his PLOF       Short Term:  Pt will demonstrate safet t/f to toilet w/LRAD   Time Frame : 1 week  Pt will complete ADL assessment    Time Frame : 1 week  Pt will verbalize 3 strategies to address managing his chronic health issues.   Time Frame : 1 week                  Prognosis:  Good  Positive Indicators:  PLOF  Barriers to Discharge: Decreased safety awareness;Motivation    Subjective  Current Status Pt received lying in bed, somnolent, moderatey cooperative.  Left reclined and sleeping in bed, all needs in reach.  Prior Functional Status Pt reports driving and having both a full time job([employer]) and part time job (pizza delivery).  He plays basketball for leisure activity.    Medical Tests / Procedures: reviewed in EPIC  Services patient receives: OT;PT  Patient / Caregiver reports: I'm pretty sleepy now.  I haven't managed my diabetes or CF very well.    Past Medical History:   Diagnosis Date   ??? Alcohol abuse    ??? Bipolar disorder (CMS-HCC)    ??? Chronic pancreatitis (CMS-HCC)    ??? Chronic sinusitis    ??? 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 Cystic fibrosis (CMS-HCC)    ??? Diabetes mellitus (CMS-HCC)     dx in-house 2015   ??? Kidney stones    ??? Portal hypertension (CMS-HCC)     Social History   Substance Use Topics   ??? Smoking status: Former Smoker     Types: Cigarettes   ??? Smokeless tobacco: Never Used   ??? Alcohol use 9.6 oz/week     16 Cans of beer per week      Past Surgical History:   Procedure Laterality Date   ??? CHOLECYSTECTOMY  2008   ??? sinus surgery     ??? TONSILLECTOMY      Family History   Problem Relation Age of Onset   ??? Diabetes Maternal Grandmother    ??? Diabetes Maternal Grandfather    ??? No Known  Problems Mother    ??? Cancer Father    ??? No Known Problems Sister         Quetiapine     Objective Findings  Precautions / Restrictions  Falls precautions    Weight Bearing  Non-applicable    Required Braces or Orthoses  Non-applicable    Communication Preference  Verbal    Pain  none voiced    Equipment / Environment  Vascular access (PIV, TLC, Port-a-cath, PICC)    Living Situation   Living environment: Apartment   Lives With: Friend(s) (2 housemates, shared meals and errands)   Home Living: One level home;Elevator   Equipment available at home: None            Cognition   Orientation Level:  Oriented x 4   Arousal/Alertness:  Delayed responses to stimuli   Attention Span:  Attends with cues to redirect   Memory:  Appears intact   Following Commands:  Follows one step commands with increased time   Safety Judgment:  Decreased awareness of need for assistance Awareness of Errors:  Decreased awareness of need for assistance   Problem Solving:  Assistance required to identify errors made   Comments:      Vision / Perception  Vision: No visual deficits  Perception: WFL       Hand Function  Hand Dominance: RH  WFL    Skin Inspection       ROM / Strength/Coordination  UE ROM/ Strength/ Coordination: WFL  LE ROM/ Strength/ Coordination: WFL    Sensation:  grossly intact    Balance:  slight LOB, independent recovery while standing w/o AD    Mobility/Gait/Transfers: S for t/f OOB d/t somnolence?  Able to march in place and perform AROM w/supervision    ADL:     Grooming: set up  Dressing: NA  Eating: NA  Toileting: mod I per pt       Vitals/ Orthostatics:  At Rest: NAD          Interventions Performed During Today's Session: ed re OT role, home safety, mobility and self care    OT G-codes  Functional Assessment Tool Used: AMPAC  Score: 19/24-42.80% impaired  Functional Limitation: Self care  Self Care Current Status (Z6109): At least 40 percent but less than 60 percent impaired, limited or restricted  Self Care Goal Status (U0454): At least 20 percent but less than 40 percent impaired, limited or restricted  Rationale: Pt presents w/somnolence d/t poor sleep last night.  He presents w/decreased activity tolerance, mild balance deficits and will benefit from skilled OT services in an acute care setting.    Eval Duration (OT): 30 Min.    Medical Staff Made Aware: LIsa RN aware of pt status after OT session    I attest that I have reviewed the above information.  SignedNelda Marseille, OT  Ceasar Mons 02/02/2017

## 2017-02-02 NOTE — Unmapped (Signed)
Report given to floor RN dyan. Awaiting on transport.

## 2017-02-02 NOTE — Unmapped (Addendum)
Care Management  Initial Transition Planning Assessment        Per H&P:  Walter [name] is a 36 y.o. male with PMHx as reviewed in the EMR who presented to [facility] with presumed Cystic Fibrosis with pulmonary exacerbation vs. Pneumonia.        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  Past Surgical History:   has a past surgical history that includes Tonsillectomy; sinus surgery; and Cholecystectomy (2008).   Previous admit date: 11/30/2016    Primary Insurance- Payor: BCBS / Plan: BCBS BLUE OPTIONS/PPO/ADV / Product Type: *No Product type* /   Secondary Insurance ??? None  Prescription Coverage ??? BCBS  Preferred Pharmacy - [id number] - , [place] - [address]  [facility] - [place], [place] - [address]    Transportation home: Private vehicle  Family will transport at discharge.  Level of function prior to admission: Independent               General  Care Manager assessed the patient by : In person interview with patient  Orientation Level: Oriented X4  Who provides care at home?: N/A    Contact/Decision Maker:    Contact Details  Contact Details: Primary Contact  Primary Contact Name: Patient: Walter [name]  [phone]  Mother:  Walter [name]  [phone]  Primary Contact Relationship: Mother  Phone #1: Patient: Walter [name]  [phone]  Mother:  Walter [name]  [phone]    Advance Directive (Medical Treatment)  Does patient have an advance directive covering medical treatment?: Patient does not have advance directive covering medical treatment.  Reason patient does not have an advance directive covering medical treatment:: Patient does not wish to complete one at this time  Surrogate decision maker appointed:: No  Reason there is not a surrogate decision maker appointed:: Patient does not wish to appoint a surrogate decision maker at this time  Information provided on advance directive:: Yes  Patient requests assistance:: No     Patient Information:    Lives with: Friends (Lives with two roommates) Three stories    Type of Residence: Private residence        Location/Detail: [address] [place]  [address]    Support Systems: Family Members, Parent    Responsibilities/Dependents at home?: No    Home Care services in place prior to admission?: No        Outpatient/Community Resources in place prior to admission: Clinic  Agency detail (Name/Phone #): [facility]    Equipment Currently Used at Home: other (see comments) Dispensing optician)           Financial Information:     Patient source of income: Work, Acupuncturist    Need for financial assistance?: No     Discharge Needs Assessment:    Concerns to be Addressed: denies needs/concerns at this time    Clinical Risk Factors: Multiple Diagnoses (Chronic)    Barriers to taking medications: No    Prior overnight hospital stay or ED visit in last 90 days: Yes  Readmission Within the Last 30 Days: no previous admission in last 30 days       Anticipated Changes Related to Illness: none    Equipment Needed After Discharge: none    Patient at risk for readmission?: Yes    Discharge Plan:    Screen findings are: Care Manager reviewed the plan of the patient's care with the Multidisciplinary Team. No discharge planning needs identified at this time. Care Manager will continue to manage plan and monitor patient's progress with the team.    Expected Discharge Date: 02/15/17    Patient and/or family were provided with choice of facilities / services that are available and appropriate to meet post hospital care needs?: Other (Comment) (Case Manager will provide choices as appropriate)     Initial Assessment complete?: Yes

## 2017-02-02 NOTE — Unmapped (Signed)
Dermatology Inpatient Consult Note    Requesting Attending Physician :  Marney Setting, MD  Service Requesting Consult : Emergency Medicine  Consulting Attending Physician: Dr. Bronson Curb    Reason for Consult: 36 yo M seen in consultation today by Dr. Heloise Beecham at the request of Dr. Marney Setting, MD of the Emergency Medicine service for evaluation of rash.    Assessment/Recommendations:  Active Problems:    * No active hospital problems. *      Tinea versicolor:  - morphology and distribution c/w tinea versicolor  - recommend Diflucan 300 x 1 if no contraindications exist  - start ketoconazole 2% cream daily  - start ketoconazole 2% shampoo daily as body wash  - counseled pt re: benign nature of this condition and etiology    Patient seen and evaluated by Dr. Heloise Beecham who is in agreement with the above plan     Thank you for the consult. Derm will sign off at this time. Please page [phone] with any questions or concerns.    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 Derm consulted for rash involving neck initially, spreading to extremities and torso subsequently over courses of months. Rash is primarily asymptomatic, occasionally a/w mild pruritus. No tx to date    Allergies:  Quetiapine    Medications:   Current Facility-Administered Medications   Medication Dose Route Frequency Provider Last Rate Last Dose   ??? acetaminophen (TYLENOL) tablet 650 mg  650 mg Oral Q4H PRN Jeanine Luz, DO       ??? albuterol (PROVENTIL HFA;VENTOLIN HFA) 90 mcg/actuation inhaler 2 puff  2 puff Inhalation Q6H PRN Jeanine Luz, DO       ??? albuterol 2.5 mg /3 mL (0.083 %) nebulizer solution 2.5 mg  2.5 mg Nebulization 4x Daily (RT) Kaila T Yeste, DO       ??? cefTAZidime (FORTAZ) 2 g in sodium chloride 0.9 % (NS) 100 mL IVPB-MBP  2 g Intravenous [facility] Jeanine Luz, DO   Stopped at 02/01/17 1608   ??? dextrose 50 % in water (D50W) solution 12.5 g  12.5 g Intravenous Q10 Min PRN Jeanine Luz, DO       ??? dornase alfa (PULMOZYME) 1 mg/mL nebulizer solution 2.5 mg  2.5 mg Inhalation Daily (RT) Kaila T Yeste, DO       ??? enoxaparin (LOVENOX) syringe 40 mg  40 mg Subcutaneous Q24H Kaila T Yeste, DO       ??? folic acid (FOLVITE) tablet 1 mg  1 mg Oral Daily Jeanine Luz, DO   1 mg at 02/01/17 1547   ??? gabapentin (NEURONTIN) capsule 400 mg  400 mg Oral TID Jeanine Luz, DO   400 mg at 02/01/17 1546   ??? [START ON 02/02/2017] insulin glargine (LANTUS) injection 20 Units  20 Units Subcutaneous Daily Kaila T Yeste, DO       ??? insulin lispro (HumaLOG) injection 0-12 Units  0-12 Units Subcutaneous ACHS Jeanine Luz, DO   Stopped at 02/01/17 1530   ??? insulin lispro (HumaLOG) injection 4 Units  4 Units Subcutaneous TID AC Kaila T Yeste, DO       ??? lactated Ringers infusion  100 mL/hr Intravenous Continuous Jeanine Luz, DO 100 mL/hr at 02/01/17 1533 100 mL/hr at 02/01/17 1533   ??? lipase-protease-amylase (ZENPEP) 20,000-63,000- 84,000 unit capsule, delayed release 240,000 units of lipase  12  capsule Oral 3xd Meals Jeanine Luz, DO   12 capsule at 02/01/17 1502   ??? lipase-protease-amylase (ZENPEP) 20,000-63,000- 84,000 unit capsule, delayed release 80,000 units of lipase  4 capsule Oral With snacks Jeanine Luz, DO       ??? LORazepam (ATIVAN) tablet 2 mg  2 mg Oral Q4H PRN Jeanine Luz, DO        Or   ??? LORazepam (ATIVAN) tablet 4 mg  4 mg Oral Q4H PRN Jeanine Luz, DO        Or   ??? LORazepam (ATIVAN) tablet 4 mg  4 mg Oral Q30 Min PRN Jeanine Luz, DO        Or   ??? LORazepam (ATIVAN) tablet 4 mg  4 mg Oral Q2H PRN Jeanine Luz, DO       ??? multivitamin therapeutic with minerals tablet  1 tablet Oral Daily Jeanine Luz, DO   1 tablet at 02/01/17 1546   ??? OLANZapine (ZYPREXA) tablet 15 mg  15 mg Oral Nightly Kaila T Yeste, DO       ??? ondansetron (ZOFRAN-ODT) disintegrating tablet 4 mg  4 mg Oral Q8H PRN Jeanine Luz, DO       ??? oxyCODONE (ROXICODONE) immediate release tablet 5 mg  5 mg Oral Q4H PRN Jeanine Luz, DO        Or   ??? oxyCODONE (ROXICODONE) immediate release tablet 10 mg  10 mg Oral Q4H PRN Jeanine Luz, DO       ??? oxyCODONE (ROXICODONE) immediate release tablet 5 mg  5 mg Oral Q2H PRN Alison Murray Hoppens, MD   5 mg at 02/01/17 1547   ??? [START ON 02/02/2017] pantoprazole (PROTONIX) EC tablet 40 mg  40 mg Oral Daily Jeanine Luz, DO       ??? polyethylene glycol (MIRALAX) packet 17 g  17 g Oral Daily Jeanine Luz, DO   Stopped at 02/01/17 1530   ??? sertraline (ZOLOFT) tablet 100 mg  100 mg Oral Daily Kaila T Yeste, DO       ??? sodium chloride 7% nebulizer solution 4 mL  4 mL Nebulization 4x Daily (RT) Rutherford Guys T Yeste, DO       ??? [START ON 02/02/2017] thiamine (B-1) tablet 200 mg  200 mg Oral Daily Kaila T Yeste, DO       ??? tobramycin (NEBCIN) 600 mg in sodium chloride (NS) 0.9% 100 mL IVPB  600 mg Intravenous Q24H Jeanine Luz, DO   Stopped at 02/01/17 1608     Current Outpatient Prescriptions   Medication Sig Dispense Refill   ??? gabapentin (NEURONTIN) 400 MG capsule Take 400 mg by mouth Three (3) times a day.     ??? sertraline (ZOLOFT) 100 MG tablet Take 2 tablets (200 mg total) by mouth daily. (Patient taking differently: Take 100 mg by mouth daily. ) 60 tablet 0   ??? albuterol (PROVENTIL HFA;VENTOLIN HFA) 90 mcg/actuation inhaler Inhale 2 puffs every six (6) hours as needed for wheezing. 1 Inhaler 2   ??? dornase alfa (PULMOZYME) 1 mg/mL nebulizer solution Inhale 2.5 mg once daily. 225 mL 3   ??? ibuprofen (ADVIL,MOTRIN) 200 MG tablet Take 800 mg by mouth daily.      ??? insulin glargine (LANTUS) 100 unit/mL injection Inject 16 Units under the skin daily.     ??? lipase-protease-amylase (ZENPEP) 20,000-68,000 -109,000 unit CpDR capsule, delayed released Take 200,000 units of lipase by mouth  Three (3) times a day with a meal. Take 10 caps with meals and 4-6 caps with snacks     ??? OLANZapine (ZYPREXA) 15 MG tablet Take 15 mg by mouth nightly.         Medical History:  Past Medical History:   Diagnosis Date   ??? Alcohol abuse    ??? Bipolar disorder (CMS-HCC)    ??? Chronic pancreatitis (CMS-HCC)    ??? Chronic sinusitis    ??? 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 Cystic fibrosis (CMS-HCC)    ??? Diabetes mellitus (CMS-HCC)     dx in-house 2015   ??? Kidney stones    ??? Portal hypertension (CMS-HCC)        Social History:  Lives in [place]    Family History:  Negative for chronic skin disease.      Review of Systems:  Patient denies any other rashes or skin problems.    Objective: :    Vitals:  Vitals:    02/01/17 1302 02/01/17 1351 02/01/17 1609 02/01/17 1718   BP: 119/86  124/84 119/89   Pulse: 82  75 67   Resp: 16   13   Temp: 36.8 ??C   36.5 ??C   TempSrc: Oral   Axillary   SpO2: 94%   98%   Weight:  72.6 kg (160 lb)         Physical Exam:  GEN: Well-appearing in NAD  NEURO: Alert and oriented  SKIN: Examination of the scalp, face, neck, chest, back, abdomen, buttocks and genitalia, bilateral upper and lower extremities including palms, soles, and nails was performed today and unremarkable except as below:  - pink to tan thin plaques w/ inducible scale w/ significant involvement of torso, neck, BUE and BLE

## 2017-02-03 LAB — CBC
HEMATOCRIT: 35.7 % — ABNORMAL LOW (ref 41.0–53.0)
MEAN CORPUSCULAR HEMOGLOBIN CONC: 34.3 g/dL (ref 31.0–37.0)
MEAN CORPUSCULAR HEMOGLOBIN: 31.1 pg (ref 26.0–34.0)
MEAN CORPUSCULAR VOLUME: 90.8 fL (ref 80.0–100.0)
MEAN PLATELET VOLUME: 6.4 fL — ABNORMAL LOW (ref 7.0–10.0)
RED CELL DISTRIBUTION WIDTH: 16.1 % — ABNORMAL HIGH (ref 12.0–15.0)
WBC ADJUSTED: 4.8 10*9/L (ref 4.5–11.0)

## 2017-02-03 LAB — BASIC METABOLIC PANEL
ANION GAP: 10 mmol/L (ref 9–15)
BUN / CREAT RATIO: 21
CALCIUM: 8.4 mg/dL — ABNORMAL LOW (ref 8.5–10.2)
CHLORIDE: 97 mmol/L — ABNORMAL LOW (ref 98–107)
CO2: 29 mmol/L (ref 22.0–30.0)
CREATININE: 0.72 mg/dL (ref 0.70–1.30)
EGFR MDRD AF AMER: >=60 mL/min/{1.73_m2} (ref >=60)
EGFR MDRD NON AF AMER: >=60 mL/min/{1.73_m2} (ref >=60)
GLUCOSE RANDOM: 241 mg/dL — ABNORMAL HIGH (ref 65–179)
POTASSIUM: 4.4 mmol/L (ref 3.5–5.0)
SODIUM: 136 mmol/L (ref 135–145)

## 2017-02-03 LAB — INR: Lab: 1.02

## 2017-02-03 LAB — MAGNESIUM: Magnesium:MCnc:Pt:Ser/Plas:Qn:: 1.5 — ABNORMAL LOW

## 2017-02-03 LAB — EGFR MDRD AF AMER: Glomerular filtration rate/1.73 sq M.predicted.black:ArVRat:Pt:Ser/Plas/Bld:Qn:Creatinine-based formula (MDRD): >=60

## 2017-02-03 LAB — PLATELET COUNT: Lab: 156

## 2017-02-03 NOTE — Unmapped (Signed)
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 Very somnolent today, concern about combination of ativan for alcohol withdrawal and oxycodone for pain.     Cystic fibrosis with acute pulmonary exacerbation: (F508del L and I 506T), FEV1 35.3%, followed by Dr. Lurena Nida. Exacerbation characterized by chest pain, increased DOE. Prior cultures mPsA, sPsA, H. Flu.   - CF culture: pending  - AFB smear neg  - Ceftazidime and tobramycin (8/21- ) plan for 2 wk course  - Bcx: NGTD  - Airway clearance: chest vest  - Home pulmozyme  - Albuterol, 7% HTS  - Tylenol as needed for pain, discontinue oxycodone     Alcohol abuse: Last drink 8/19, no hx of complicated withdrawal  - CIWA  - HOLD ativan for sedation - was somnolent this afternoon. Evaluate objective withdrawal findings- tremor, tachycardia, diaphoresis.   - Thiamine, folic acid, MVM  - Tox screen 8/22: pending    CF related diabetes mellitus: Uncontrolled, HbA1c 10.5%  - Lantus increased 26 units daily  - Lispro 4 --> 6 units w/ meals  - Lispro SSI QID Ac    CF related pancreatic insufficiency:   - Zenpep 12 capsules w/ meals, 4 with snacks  - MVM w/ minerals     Hepatic steatosis: complicated by alcohol use  - Urosodiol 900 mg BID (was not taking at home, no reasoning, able to afford, no s/e)    Tinea versicolor, improving:   - Fluconazole 300 mg x 1  - Ketoconazole cream  - Diflucan 300mg  x1 Depression/Bipolar disorder:   - Home zoloft and zyprexa  - Psych consult    Subjective:    Patient left the floor last night. This morning, he can't remember why he left or where he went. After speaking with Dr. Garner Nash, he voices understanding of why leaving is a risk to himself and others and may result in extra security and/or a sitter.     Objective:    Vital signs in last 24 hours:  Temp:  [36.7 ??C-37.2 ??C] 36.8 ??C  Heart Rate:  [62-119] 75  Resp:  [16-22] 18  BP: (126-152)/(77-112) 126/77  SpO2:  [84 %-99 %] 90 %    Intake/Output this shift:  No intake/output data recorded.    Physical Exam:  General: NAD, laying in bed  HEENT: MMM, diaphoretic  Cardiovascular: Regular rate, normal rhythm. No murmurs, rubs or gallops.   Lung: Coarse bs, crackles in RLL, no wheezing.   Extremeties: WWP  Skin: Hyperpigemented papules with scaling on back, chest, arms, and abdomen improving  Psych: Flat affect, poor eye contact , withdrawn     Walter Wehner T. Riley Churches, DO  PGY1 - Pager: [phone]

## 2017-02-03 NOTE — Unmapped (Signed)
Aminoglycoside Follow-Up Pharmacy Note    Walter [name] is a 36 y.o. male on tobramycin 600 mg IV  every 24 hours for CF exacerbation.  Currently on day 3 of therapy.    High-dose CF extended infusion (1 hr infusion):  Goal peak: 20-30 mg/L   Goal trough: <0.6 mg/L (6-8 hr drug free interval)    Wt Readings from Last 3 Encounters:   02/02/17 79.3 kg (174 lb 13.2 oz)   12/01/16 72.6 kg (160 lb)   10/09/16 76.2 kg (167 lb 15.9 oz)     Results for [name], [name] (MRN [id number]) as of 02/03/2017 08:12   Ref. Range 02/01/2017 08:42 02/02/2017 05:34 02/03/2017 05:35   Bun Latest Ref Range: 7 - 21 mg/dL 12 14 15    Creatinine Latest Ref Range: 0.70 - 1.30 mg/dL 2.13 (L) 0.86 (L) 5.78       Measured peak = 17.2 mg/L  Calculated peak = 26.9 mg/L    Measured trough/random = 2 mg/L  Calculated trough = 0 mg/L    Patient-Specific Pharmacokinetic Parameters:  Vd = 18.7 L, ke = 0.36 hr-1    Based on levels, recommend continue at 600 mg every 24 hours. (1 hour infusion)    Pharmacy will continue to monitor for changes in renal function, toxicity, and efficacy and order additional levels as needed. Please page service pharmacist with questions/clarifications.    Walter [name],  PharmD   PGY-1 Pharmacy Resident     Walter [name], PharmD  MDG Team Pharmacist for April 27, 2017 5:18 PM

## 2017-02-03 NOTE — Unmapped (Signed)
Problem: Cystic Fibrosis (Adult)  Goal: Signs and Symptoms of Listed Potential Problems Will be Absent, Minimized or Managed (Cystic Fibrosis)  Signs and symptoms of listed potential problems will be absent, minimized or managed by discharge/transition of care (reference Cystic Fibrosis (Adult) CPG).   Outcome: Progressing  Patient did well with doing all of their treatments today. Patient did their airway clearance without complications.

## 2017-02-03 NOTE — Unmapped (Signed)
Problem: Patient Care Overview  Goal: Plan of Care Review  Outcome: Progressing  Pt remains on RA with stable O2 sats, no resp distress noted. IV abx and neb treatments continued as ordered. CIWA wnl, no withdrawal symptoms noted. Tylenol continued for pleuritic CP with no additional c/o's. Will cont to monitor.     Problem: Cystic Fibrosis (Adult)  Goal: Signs and Symptoms of Listed Potential Problems Will be Absent, Minimized or Managed (Cystic Fibrosis)  Signs and symptoms of listed potential problems will be absent, minimized or managed by discharge/transition of care (reference Cystic Fibrosis (Adult) CPG).   Outcome: Progressing      Problem: Pain, Acute (Adult)  Goal: Acceptable Pain Control/Comfort Level  Patient will demonstrate the desired outcomes by discharge/transition of care.   Outcome: Progressing      Problem: Alcohol Withdrawal Acute, Risk/Actual (Adult)  Goal: Signs and Symptoms of Listed Potential Problems Will be Absent, Minimized or Managed (Alcohol Withdrawal Acute, Risk/Actual)  Signs and symptoms of listed potential problems will be absent, minimized or managed by discharge/transition of care (reference Alcohol Withdrawal Acute, Risk/Actual (Adult) CPG).   Outcome: Progressing

## 2017-02-03 NOTE — Unmapped (Signed)
Problem: Patient Care Overview  Goal: Plan of Care Review  Outcome: Progressing   02/02/17 1800   OTHER   Plan of Care Reviewed With patient   Plan of Care Review   Progress no change     Goal: Individualization and Mutuality  Outcome: Progressing   02/02/17 1800   OTHER   What Anxieties, Fears, Concerns, or Questions Do You Have About Your Care? anxious all day today   Individualization   Patient Specific Preferences patient reports most of today that he feels extremely anxious, agitated. Otherwise, he has slept all day     Goal: Discharge Needs Assessment  Outcome: Progressing   02/02/17 1800   Discharge Needs Assessment   Concerns to be Addressed adjustment to diagnosis/illness;coping/stress;decision making;medication;substance/tobacco abuse/use;mental health   Patient/Family Anticipates Transition to home   Anticipated Changes Related to Illness inability to work  (employed, but not able to work 2/2 being hospitalized)   Current Discharge Risk substance use/abuse;chronically ill;non-alliance     Goal: Interprofessional Rounds/Family Conf  Outcome: Progressing      Problem: Alcohol Withdrawal Acute, Risk/Actual (Adult)  Intervention: Support/Optimize Psychosocial Response to Withdrawal Process  Patient has been drowsy most of today and when awake, he reports pain, anxiety, agitation.  He frequently asked for PRN Lorazepam, pain meds, etc. RNs and MDs have noted patient sedated and only arousable to tactile stimulus; therefore, MDs have adjusted meds and patient has been updated.  Patient aware of fact that RNs have to complete CIWA assessment frequently and pt asked for RN to score him twice today although it was not time.  RNs educated patient on frequency of CIWA assessment and patient verbalized understanding.    Goal: Signs and Symptoms of Listed Potential Problems Will be Absent, Minimized or Managed (Alcohol Withdrawal Acute, Risk/Actual)  Signs and symptoms of listed potential problems will be absent, minimized or managed by discharge/transition of care (reference Alcohol Withdrawal Acute, Risk/Actual (Adult) CPG).   Outcome: Progressing   02/02/17 1800   Alcohol Withdrawal Acute, Risk/Actual (Adult)   Problems Assessed (Alcohol Withdrawal Syndrome) effects of AWS (alcohol withdrawal syndrome);situational response   Problems Present (Alcohol W/D Syndrome) effects of AWS (alcohol withdrawal syndrome);situational response       Comments: Patient removed Pulse-Ox sensor several times today and telemetry called to report that patient off his monitor. Patient re-educated multiple times on keeping sensor on his finger so that staff and Cardiac Surveillance can monitor appropriately.  Patient noncompliant and still removed sensor several times even after being asked not to.

## 2017-02-03 NOTE — Unmapped (Signed)
Problem: Patient Care Overview  Goal: Plan of Care Review  Outcome: Progressing  Pt afebrile with stable VS. Pt's Spo2 dropped to 88 at the beginning of the shift; pt recovered to >90 with 1 L via [place]. Pt's SpO2 remained > 90 on RA this shift. Pt refused shift assessment. Urine toxicology screen not collected as of this note. CIWAs this shift WNL; no complaints of anxiety, pain, or hallucinations. No other concerns noted. Will continue to monitor and follow the POC.   02/03/17 1600   OTHER   Plan of Care Reviewed With patient     Goal: Individualization and Mutuality  Outcome: Progressing    Goal: Discharge Needs Assessment  Outcome: Progressing   02/02/17 1800 02/03/17 1600   Discharge Needs Assessment   Concerns to be Addressed adjustment to diagnosis/illness;coping/stress;decision making;medication;substance/tobacco abuse/use;mental health --    Current Discharge Risk --  chronically ill;substance use/abuse       Problem: Cystic Fibrosis (Adult)  Goal: Signs and Symptoms of Listed Potential Problems Will be Absent, Minimized or Managed (Cystic Fibrosis)  Signs and symptoms of listed potential problems will be absent, minimized or managed by discharge/transition of care (reference Cystic Fibrosis (Adult) CPG).   Outcome: Progressing   02/03/17 1600   Cystic Fibrosis (Adult)   Problems Assessed (Cystic Fibrosis) all   Problems Present (Cystic Fibrosis) pulmonary/respiratory complications       Problem: Pain, Acute (Adult)  Goal: Identify Related Risk Factors and Signs and Symptoms  Related risk factors and signs and symptoms are identified upon initiation of Human Response Clinical Practice Guideline (CPG).   Outcome: Progressing   02/03/17 1600   Pain, Acute (Adult)   Related Risk Factors (Acute Pain) disease process   Signs and Symptoms (Acute Pain) verbalization of pain descriptors       Problem: Alcohol Withdrawal Acute, Risk/Actual (Adult)  Goal: Signs and Symptoms of Listed Potential Problems Will be Absent, Minimized or Managed (Alcohol Withdrawal Acute, Risk/Actual)  Signs and symptoms of listed potential problems will be absent, minimized or managed by discharge/transition of care (reference Alcohol Withdrawal Acute, Risk/Actual (Adult) CPG).   Outcome: Progressing   02/03/17 1600   Alcohol Withdrawal Acute, Risk/Actual (Adult)   Problems Assessed (Alcohol Withdrawal Syndrome) all   Problems Present (Alcohol W/D Syndrome) none       Problem: VTE, DVT and PE (Adult)  Goal: Signs and Symptoms of Listed Potential Problems Will be Absent, Minimized or Managed (VTE, DVT and PE)  Signs and symptoms of listed potential problems will be absent, minimized or managed by discharge/transition of care (reference VTE, DVT and PE (Adult) CPG).   Outcome: Progressing   02/03/17 1600   VTE, DVT and PE (Adult)   Problems Assessed (VTE, DVT, PE) all   Problems Present (VTE, DVT, PE) none

## 2017-02-04 LAB — TOXICOLOGY SCREEN, URINE
AMPHETAMINE SCREEN URINE: <500
AMPHETAMINE SCREEN URINE: <500
BARBITURATE SCREEN URINE: <200
BARBITURATE SCREEN URINE: <200
BENZODIAZEPINE SCREEN, URINE: <200
COCAINE(METAB.)SCREEN, URINE: <150
METHADONE SCREEN, URINE: <300
OPIATE SCREEN URINE: <300
OPIATE SCREEN URINE: <300

## 2017-02-04 LAB — HEPATIC FUNCTION PANEL
ALBUMIN: 3.2 g/dL — ABNORMAL LOW (ref 3.5–5.0)
ALT (SGPT): 26 U/L (ref 19–72)
BILIRUBIN DIRECT: <0.1 mg/dL (ref 0.00–0.40)
PROTEIN TOTAL: 6.3 g/dL — ABNORMAL LOW (ref 6.5–8.3)

## 2017-02-04 LAB — INR: Lab: 0.99

## 2017-02-04 LAB — CBC
HEMOGLOBIN: 13.1 g/dL — ABNORMAL LOW (ref 13.5–17.5)
MEAN CORPUSCULAR HEMOGLOBIN CONC: 33.7 g/dL (ref 31.0–37.0)
MEAN CORPUSCULAR HEMOGLOBIN: 30.5 pg (ref 26.0–34.0)
MEAN CORPUSCULAR VOLUME: 90.4 fL (ref 80.0–100.0)
MEAN PLATELET VOLUME: 6.2 fL — ABNORMAL LOW (ref 7.0–10.0)
PLATELET COUNT: 179 10*9/L (ref 150–440)
RED BLOOD CELL COUNT: 4.3 10*12/L — ABNORMAL LOW (ref 4.50–5.90)
WBC ADJUSTED: 4.9 10*9/L (ref 4.5–11.0)

## 2017-02-04 LAB — MAGNESIUM: Magnesium:MCnc:Pt:Ser/Plas:Qn:: 2

## 2017-02-04 LAB — PROTIME-INR: INR: 0.99

## 2017-02-04 LAB — ALKALINE PHOSPHATASE: Alkaline phosphatase:CCnc:Pt:Ser/Plas:Qn:: 156 — ABNORMAL HIGH

## 2017-02-04 LAB — BASIC METABOLIC PANEL
ANION GAP: 7 mmol/L — ABNORMAL LOW (ref 9–15)
BLOOD UREA NITROGEN: 12 mg/dL (ref 7–21)
BUN / CREAT RATIO: 22
CALCIUM: 8.1 mg/dL — ABNORMAL LOW (ref 8.5–10.2)
CHLORIDE: 103 mmol/L (ref 98–107)
CO2: 27 mmol/L (ref 22.0–30.0)
EGFR MDRD AF AMER: >=60 mL/min/{1.73_m2} (ref >=60)
EGFR MDRD NON AF AMER: >=60 mL/min/{1.73_m2} (ref >=60)
GLUCOSE RANDOM: 169 mg/dL (ref 65–179)
POTASSIUM: 4.1 mmol/L (ref 3.5–5.0)

## 2017-02-04 LAB — COCAINE(METAB.)SCREEN, URINE: Lab: <150

## 2017-02-04 LAB — WBC ADJUSTED: Lab: 4.9

## 2017-02-04 LAB — GLUCOSE RANDOM: Glucose:MCnc:Pt:Ser/Plas:Qn:: 169

## 2017-02-04 LAB — VITAMIN D, TOTAL (25OH): Lab: 8.8 — ABNORMAL LOW

## 2017-02-04 LAB — OPIATE SCREEN URINE: Lab: <300

## 2017-02-04 NOTE — Unmapped (Signed)
Problem: Cystic Fibrosis (Adult)  Goal: Signs and Symptoms of Listed Potential Problems Will be Absent, Minimized or Managed (Cystic Fibrosis)  Signs and symptoms of listed potential problems will be absent, minimized or managed by discharge/transition of care (reference Cystic Fibrosis (Adult) CPG).   Pt. Is okay with doing breathing tx but refuses his airway clearance.

## 2017-02-04 NOTE — Unmapped (Signed)
[facility]  Initial Psychiatry Consult    Service Date: February 04, 2017  LOS:  LOS: 3 days   Consulting Attending: Voncille Lo, MD  Consulting Resident: Walter [name]     Assessment:   Walter [name] is a 36 y.o. 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 Psychiatry was consulted by Walter [name],* with Pulmonology (MDG) for Medication management.    The patient's current presentation is most consistent with alcohol use disorder and substance-induced depressive disorder. He has recent stressor of father's death, though his reaction to this significant stressor seems in excess of expected grief reaction. Rather, he has coped poorly with increasing alcohol use and further isolation from sources of support in his life. His prominent depressive symptoms of anhedonia, apathy, amotivation, poor sleep, diminished interests, guilt, low energy, fatigue, and poor concentration would meet criteria for major depressive episode, though it is in the setting of significant daily alcohol use. As such, it is most consistent with substance-induced depressive disorder. While patient would certainly warrant treatment of his underlying alcohol use disorder which worsens both his medical conditions and mental health, he is not currently motivated to engage in this treatment.    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)    Alcohol abuse    Vitamin D deficiency    Cystic fibrosis with pulmonary exacerbation (CMS-HCC)    Cystic fibrosis with liver disease (CMS-HCC)    Substance or medication-induced depressive disorder (CMS-HCC)     Problems edited/added by me:  Problem   Substance Or Medication-Induced Depressive Disorder (Cms-Hcc)     Safety Risk Assessment:  No acute safety concerns  Risk factors for self-harm/suicide: previous suicide attempt(s), feelings of hopelessness, unwillingness to seek help, current substance abuse, history of substance abuse, male age 36-35, chronic severe medical condition, chronic mental illness > 5 years, chronic impulsivity and chronic poor judgment  Protective factors against self-harm/suicide:  lack of active SI, no know access to weapons or firearms, supportive friends and safe housing  Risk factors for harm to others: current substance abuse  Protective factors against harm to others: no known violence towards others in the last 6 months and no known homicidal ideation in the last 6 months  While future psychiatric events cannot be accurately predicted, the patient is not currently at elevated imminent risk, and is at elevated chronic risk of harm to self and is not currently at elevated imminent risk, and is not at elevated chronic risk of harm to others.    Recommendations:   -- Safety Recommendations: If pt attempts to leave against medical advice and it is felt to be unsafe for them to leave, please call a Behavioral Response and page Psychiatry at [phone]   - no acute safety concerns    Medication Recommendations:  - INCREASE zoloft to 200 mg qday  - continue zyprexa 15 mg qhs  - continue gabapentin  - continue thiamine, folic acid, MVI  - patient not interested in naltrexone, antabuse, or any kind of treatment for alcohol use disorder    Medical Decision Making Capacity: not formally assessed    Further Work-up:  deferred    Disposition: patient can be safely discharged when medically appropriate.   - recommend pt have outpatient psychiatric care. He can contact his insurance carrier or Designer, industrial/product, [employer] [phone]).    Behavioral Recommendations:  The patient's life experiences have resulted in significant difficulty in establishing a therapeutic alliance with medical providers and frequent feelings of abandonment, consequently creating a frustrating environment in which to provide medical care to the patient. Utilizing compassion and acknowledging the patient's experiences, while setting clear and realistic expectations for care will both assist the patient and providers in this challenging case.   ?? To minimize splitting of staff, assign one staff person (likely primary resident) as the individual that communicates all information from the team.  ?? Validate patient's anxieties and emotions. Do not validate the invalid; instead validate feelings, and correct assumptions.  ?? Patients with borderline personality traits/disorder often use the language of physical pain to communicate both physical and emotional suffering. It is important to address pain complaints as they arise and attempt to identify an etiology, either organic or psychiatric. In patient's with chronic pain, it is important to have a discussion with the patient about expectations about pain control.    Thank you for this consult request. Recommendations have been communicated to the primary team.  We will follow as needed at this time. Please page [phone] (after hours) for any questions or concerns.     Discussed with and seen by Attending Walter Lo, MD, who agrees with the assessment and plan.     Walter Cage, MD    I saw and evaluated the patient, participating in the key portions of the service.  I reviewed the resident???s note.  I agree with the resident???s findings and plan. 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    Relevant Aspects of Hospital Course:   Admitted on 02/01/2017 for CF bronchiectasis exacerbation w/ acute hypoxia in setting of pseudomonas colonization, uncontrolled CFRD, and alcohol abuse. Patient receiving IV antibiotics.    Patient Report:   Pt notes frustration with his current care, noting that he was discontinued from ativan too quickly. Notes drinking ~4 malt liquors daily to the point of blacking out. States he passes around 9pm and wakes up around 4 am. Then finishes the rest of the alcohol and goes back to sleep. He states last drink was Sunday night. Denies hx of complicated withdrawal, denies DTs, denies seizures. He states he left hospital to go to his car two nights ago in order to get an inhaler. States father died in 01/02/2017. Has not been coping well, increased alcohol use and endorses multiple depressive symptoms. Denies suicidality. Denies plan or intent to harm self. Denies hallucinations or symptoms of mania. He does not agree with his diagnosis of bipolar disorder. States he has only had symptoms in the setting of drinking. Feels like he has really never been sober except when he is in inpatient treatment. Would prefer to always be intoxicated, and states he is not motivated or interested in treatment for alcohol use disorder.    ROS:  MSK: endorses rib pain  Neuro: endorses feeling tremulous  Comprehensive ROS negative except as documented above    Collateral information:   Review of [place] Controlled Substance Reporting System, morphine 15 mg tab #12 dispensed 10/07/16; chlordiazepoxide 25 mg #10 filled 06/19/16    Psychiatric History:   ? 3 prior psychiatric hospitalizations [facility] 11/22-12/6/17 for SI/mania, [facility] in 07/2013 for suicide attempt, [place] in 2015 for depression after rehab)  ? Med trials include Subutex, Seroquel (reported allergic reaction), Gabapentin, Prozac, Zoloft, Lithium (was horrible), Celexa (the worse), Lexapro (didn't work), trazodone (made me feel funny), hydroxyzine (anticholinergics make me cough up blood eventually), remeron (didn't work), naltrexone (didn't work)  ? Patient previously had 8 months of sobriety with involvement in IOP program and AA  ? Family psychiatric history: Reported that mother has bipolar disorder    At discharge 05/2016 from [facility], was on zyprexa 15 mg qhs and naltrexone 50 mg daily  When seen by [facility] consult team 06/2016, was recommended to start remeron  Seen by [place] consult team 09/2016, at which time their note indicates zyprexa 5 mg qhs, zoloft 200 mg daily, and gabapentin 300 mg TID, as well as holding naltrexone in the setting of alcohol intoxication    Medical History:  Past Medical History:   Diagnosis Date   ??? Alcohol abuse    ??? Bipolar disorder (CMS-HCC)    ??? Chronic pancreatitis (CMS-HCC)    ??? Chronic sinusitis    ??? 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 Cystic fibrosis (CMS-HCC)    ??? Diabetes mellitus (CMS-HCC)     dx in-house 2015   ??? Kidney stones    ??? Portal hypertension (CMS-HCC)      Surgical History:  Past Surgical History:   Procedure Laterality Date   ??? CHOLECYSTECTOMY  2008   ??? sinus surgery     ??? TONSILLECTOMY       Medications:     Current Facility-Administered Medications:   ???  acetaminophen (TYLENOL) tablet 650 mg, 650 mg, Oral, Q4H PRN, Marguerite Olea, MD, 650 mg at 02/02/17 2327  ???  albuterol (PROVENTIL HFA;VENTOLIN HFA) 90 mcg/actuation inhaler 2 puff, 2 puff, Inhalation, Q6H PRN, Kaila T Yeste, DO  ???  albuterol 2.5 mg /3 mL (0.083 %) nebulizer solution 2.5 mg, 2.5 mg, Nebulization, 4x Daily (RT), Jeanine Luz, DO, 2.5 mg at 02/04/17 0851  ???  calcium carbonate (OS-CAL) tablet 600 mg of elem calcium, 600 mg of elem calcium, Oral, Daily, Marguerite Olea, MD, 600 mg of elem calcium at 02/04/17 0910  ???  cefTAZidime (FORTAZ) 2 g in sodium chloride 0.9 % (NS) 100 mL IVPB-MBP, 2 g, Intravenous, Q8H SCH, Kaila T Yeste, DO, Last Rate: 400 mL/hr at 02/04/17 0554, 2 g at 02/04/17 0554  ???  dextrose 50 % in water (D50W) solution 12.5 g, 12.5 g, Intravenous, Q10 Min PRN, Kaila T Yeste, DO  ???  dornase alfa (PULMOZYME) 1 mg/mL nebulizer solution 2.5 mg, 2.5 mg, Inhalation, Daily (RT), Jeanine Luz, DO, 2.5 mg at 02/04/17 0851  ???  enoxaparin (LOVENOX) syringe 40 mg, 40 mg, Subcutaneous, Q24H, Kaila T Yeste, DO, 40 mg at 02/03/17 1436  ???  folic acid (FOLVITE) tablet 1 mg, 1 mg, Oral, Daily, Kaila T Yeste, DO, 1 mg at 02/04/17 0910  ???  gabapentin (NEURONTIN) capsule 400 mg, 400 mg, Oral, TID, Kaila T Yeste, DO, 400 mg at 02/04/17 0910  ???  insulin glargine (LANTUS) injection 26  Units, 26 Units, Subcutaneous, Daily, Marguerite Olea, MD, 26 Units at 02/04/17 0911  ???  insulin lispro (HumaLOG) injection 0-12 Units, 0-12 Units, Subcutaneous, ACHS, Kaila T Yeste, DO, 2 Units at 02/04/17 0925  ???  insulin lispro (HumaLOG) injection 6 Units, 6 Units, Subcutaneous, ACHS, Marguerite Olea, MD, 6 Units at 02/04/17 0920  ???  ketoconazole (NIZORAL) 2 % cream 1 application, 1 application, Topical, Daily, Jeanine Luz, DO, 1 application at 02/04/17 6045  ???  ketoconazole (NIZORAL) 2 % shampoo, , Topical, Daily PRN, Kaila T Yeste, DO  ???  lidocaine (LIDODERM) 5 % patch 1 patch, 1 patch, Transdermal, Daily, Marguerite Olea, MD, 1 patch at 02/02/17 1858  ???  lipase-protease-amylase (ZENPEP) 20,000-63,000- 84,000 unit capsule, delayed release 240,000 units of lipase, 12 capsule, Oral, 3xd Meals, Kaila T Yeste, DO, 240,000 units of lipase at 02/04/17 0800  ???  lipase-protease-amylase (ZENPEP) 20,000-63,000- 84,000 unit capsule, delayed release 80,000 units of lipase, 4 capsule, Oral, With snacks, Jeanine Luz, DO  ???  MVW Complete (pediatric multivit 61-D3-vit K) 1,500-800 unit-mcg 2 capsule, 2 capsule, Oral, Daily, Walter Mainland, MD, 2 capsule at 02/04/17 4098  ???  OLANZapine (ZYPREXA) tablet 15 mg, 15 mg, Oral, Nightly, Kaila T Yeste, DO, 15 mg at 02/03/17 2136  ???  ondansetron (ZOFRAN-ODT) disintegrating tablet 4 mg, 4 mg, Oral, Q8H PRN, Kaila T Yeste, DO  ???  pantoprazole (PROTONIX) EC tablet 40 mg, 40 mg, Oral, Daily, Kaila T Yeste, DO, 40 mg at 02/04/17 0910  ???  phytonadione (vitamin K1) (MEPHYTON) tablet 5 mg, 5 mg, Oral, Tue,Sat, Walter Mainland, MD, 5 mg at 02/03/17 2307  ???  polyethylene glycol (MIRALAX) packet 17 g, 17 g, Oral, Daily, Jeanine Luz, DO, Stopped at 02/01/17 1530  ???  sertraline (ZOLOFT) tablet 100 mg, 100 mg, Oral, Daily, Kaila T Yeste, DO, 100 mg at 02/04/17 0910  ???  sodium chloride 7% nebulizer solution 4 mL, 4 mL, Nebulization, 4x Daily (RT), Jeanine Luz, DO, 4 mL at 02/04/17 0851  ???  thiamine (B-1) tablet 100 mg, 100 mg, Oral, Daily, Kaila T Yeste, DO, 100 mg at 02/04/17 0909  ???  tobramycin (NEBCIN) 600 mg in sodium chloride (NS) 0.9% 100 mL IVPB, 600 mg, Intravenous, Q24H, Stopped at 02/04/17 1027 **AND** Inpatient consult to Pharmacy RX to dose: tobramycin, , , Once, Kaila T Yeste, DO  ???  ursodiol (ACTIGALL) capsule 900 mg, 900 mg, Oral, BID, Marguerite Olea, MD, 900 mg at 02/04/17 1191    Allergies:  Allergies   Allergen Reactions   ??? Quetiapine Shortness Of Breath     Pt states that he does not have an allergy to this medication.     Social History:  Engineer, maintenance (IT)  Lives with two roommates  Works in IT support    Objective:   Vital signs:   Temp:  [36.2 ??C-36.8 ??C] 36.2 ??C  Heart Rate:  [74-75] 74  Resp:  [18] 18  BP: (126-137)/(77-103) 137/103  SpO2:  [90 %-95 %] 91 %    Mental Status Exam:  Appearance:  appears stated age and lying in bed   Attitude:   disinterested in interview, at times irritable   Behavior/Psychomotor:  no abnormal movements and no psychomotor agitation   Speech/Language:   normal rate, volume, tone, fluency and language intact, well formed   Mood:  ???I know I'm depressed???   Affect:  irritable, labile and frustrated Thought process:  logical,  linear, clear, coherent, goal directed   Thought content:    denies thoughts of self-harm. Denies SI, plans, or intent. Denies HI.  No grandiose, self-referential, persecutory, or paranoid delusions noted.   Perceptual disturbances:   denies auditory and visual hallucinations and behavior not concerning for response to internal stimuli   Attention:  able to fully attend without fluctuations in consciousness   Concentration:  Able to fully concentrate and attend   Orientation:  grossly oriented   Memory:  not formally tested, but grossly intact   Fund of knowledge:   consistent with level of education and development   Insight:    Fair   Judgment:   Limited   Impulse Control:  Limited     Data Reviewed:  I reviewed labs from the last 24 hours.

## 2017-02-04 NOTE — Unmapped (Signed)
This morning patient again reminded of need for urine sample. Pt reports he hasn't urinated in a while. RN inquired about history of urine retention. Pt denied. Will continue to collect urine tox screen.

## 2017-02-04 NOTE — Unmapped (Signed)
The Venous Access Team (VAT) received a request from care RN.   PIV placed.           PIV Workup / Procedure Time:  15 minutes      The primary RN was notified.       Thank you,     Gillie Manners RN Venous Access Team

## 2017-02-04 NOTE — Unmapped (Signed)
Patient sleeping upon my arrival.  Awakes easily however refused respiratory meds and clearance

## 2017-02-04 NOTE — Unmapped (Signed)
Problem: Patient Care Overview  Goal: Plan of Care Review  Outcome: Progressing  Pt received AAOx4, VSS and afebrile. Pt on RA, SpO2 ranging from 89-93% on RA. Lung sounds with diminshed rhonchi. CIWA-AR monitored q6h per order, CIWA score WDL. Pt has remained in his room and has not attempted to leave. No c/o pain. Pt with outstanding urine tox. Pt reminded urine sample is still needed, so far patient continues to not save urine. Pt receives pharmacological anticoagulation per eMAR. Will continue to monitor    Problem: Cystic Fibrosis (Adult)  Goal: Signs and Symptoms of Listed Potential Problems Will be Absent, Minimized or Managed (Cystic Fibrosis)  Signs and symptoms of listed potential problems will be absent, minimized or managed by discharge/transition of care (reference Cystic Fibrosis (Adult) CPG).   Outcome: Progressing      Problem: Pain, Acute (Adult)  Goal: Identify Related Risk Factors and Signs and Symptoms  Related risk factors and signs and symptoms are identified upon initiation of Human Response Clinical Practice Guideline (CPG).   Outcome: Progressing      Problem: Alcohol Withdrawal Acute, Risk/Actual (Adult)  Goal: Signs and Symptoms of Listed Potential Problems Will be Absent, Minimized or Managed (Alcohol Withdrawal Acute, Risk/Actual)  Signs and symptoms of listed potential problems will be absent, minimized or managed by discharge/transition of care (reference Alcohol Withdrawal Acute, Risk/Actual (Adult) CPG).   Outcome: Progressing      Problem: VTE, DVT and PE (Adult)  Goal: Signs and Symptoms of Listed Potential Problems Will be Absent, Minimized or Managed (VTE, DVT and PE)  Signs and symptoms of listed potential problems will be absent, minimized or managed by discharge/transition of care (reference VTE, DVT and PE (Adult) CPG).   Outcome: Progressing

## 2017-02-05 LAB — RED BLOOD CELL COUNT: Lab: 3.88 — ABNORMAL LOW

## 2017-02-05 LAB — CBC
MEAN CORPUSCULAR HEMOGLOBIN CONC: 34.6 g/dL (ref 31.0–37.0)
MEAN CORPUSCULAR HEMOGLOBIN: 31.5 pg (ref 26.0–34.0)
MEAN CORPUSCULAR VOLUME: 91.1 fL (ref 80.0–100.0)
PLATELET COUNT: 173 10*9/L (ref 150–440)
RED BLOOD CELL COUNT: 3.88 10*12/L — ABNORMAL LOW (ref 4.50–5.90)
RED CELL DISTRIBUTION WIDTH: 15.7 % — ABNORMAL HIGH (ref 12.0–15.0)
WBC ADJUSTED: 9.3 10*9/L (ref 4.5–11.0)

## 2017-02-05 LAB — BASIC METABOLIC PANEL
BLOOD UREA NITROGEN: 11 mg/dL (ref 7–21)
BUN / CREAT RATIO: 13
CALCIUM: 8.3 mg/dL — ABNORMAL LOW (ref 8.5–10.2)
CHLORIDE: 103 mmol/L (ref 98–107)
CO2: 27 mmol/L (ref 22.0–30.0)
CREATININE: 0.87 mg/dL (ref 0.70–1.30)
EGFR MDRD AF AMER: >=60 mL/min/{1.73_m2} (ref >=60)
EGFR MDRD NON AF AMER: >=60 mL/min/{1.73_m2} (ref >=60)
SODIUM: 141 mmol/L (ref 135–145)

## 2017-02-05 LAB — HIV ANTIGEN/ANTIBODY COMBO: HIV 1+2 Ab+HIV1 p24 Ag:PrThr:Pt:Ser/Plas:Ord:IA: NONREACTIVE

## 2017-02-05 LAB — HEPATITIS B SURFACE ANTIBODY: Hepatitis B virus surface Ab:PrThr:Pt:Ser:Ord:: NONREACTIVE

## 2017-02-05 LAB — HEPATITIS C ANTIBODY: Hepatitis C virus Ab:PrThr:Pt:Ser:Ord:: REACTIVE — AB

## 2017-02-05 LAB — EGFR MDRD AF AMER: Glomerular filtration rate/1.73 sq M.predicted.black:ArVRat:Pt:Ser/Plas/Bld:Qn:Creatinine-based formula (MDRD): >=60

## 2017-02-05 LAB — PROTIME: Lab: 10.9

## 2017-02-05 LAB — HEPATITIS B CORE TOTAL ANTIBODY: Hepatitis B virus core Ab:PrThr:Pt:Ser/Plas:Ord:IA: NONREACTIVE

## 2017-02-05 LAB — PROTIME-INR: INR: 0.96

## 2017-02-05 LAB — MAGNESIUM
MAGNESIUM: 1.7 mg/dL (ref 1.6–2.2)
Magnesium:MCnc:Pt:Ser/Plas:Qn:: 1.7

## 2017-02-05 NOTE — Unmapped (Signed)
Problem: VTE, DVT and PE (Adult)  Goal: Signs and Symptoms of Listed Potential Problems Will be Absent, Minimized or Managed (VTE, DVT and PE)  Signs and symptoms of listed potential problems will be absent, minimized or managed by discharge/transition of care (reference VTE, DVT and PE (Adult) CPG).   Outcome: Progressing  Pt up ambulating in halls. Pt refused lovenox, but agreed to stay active. Pt reports cough is getting better.

## 2017-02-05 NOTE — Unmapped (Signed)
Problem: Patient Care Overview  Goal: Plan of Care Review  Outcome: Progressing      Problem: Cystic Fibrosis (Adult)  Goal: Signs and Symptoms of Listed Potential Problems Will be Absent, Minimized or Managed (Cystic Fibrosis)  Signs and symptoms of listed potential problems will be absent, minimized or managed by discharge/transition of care (reference Cystic Fibrosis (Adult) CPG).   Outcome: Progressing      Problem: Alcohol Withdrawal Acute, Risk/Actual (Adult)  Goal: Signs and Symptoms of Listed Potential Problems Will be Absent, Minimized or Managed (Alcohol Withdrawal Acute, Risk/Actual)  Signs and symptoms of listed potential problems will be absent, minimized or managed by discharge/transition of care (reference Alcohol Withdrawal Acute, Risk/Actual (Adult) CPG).   Outcome: Progressing      Problem: VTE, DVT and PE (Adult)  Goal: Signs and Symptoms of Listed Potential Problems Will be Absent, Minimized or Managed (VTE, DVT and PE)  Signs and symptoms of listed potential problems will be absent, minimized or managed by discharge/transition of care (reference VTE, DVT and PE (Adult) CPG).   Outcome: Progressing

## 2017-02-05 NOTE — Unmapped (Signed)
Problem: Patient Care Overview  Goal: Individualization and Mutuality  Outcome: Progressing

## 2017-02-05 NOTE — Unmapped (Signed)
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    Cystic fibrosis with acute pulmonary exacerbation: (F508del L and I 506T), FEV1 35.3%, followed by Dr. Lurena Nida. Exacerbation characterized by chest pain, increased DOE. Prior cultures mPsA, sPsA, H. Flu.   - CF culture: 2+ mucoid Pseudomonas  - AFB smear neg  - Ceftazidime and tobramycin (8/21- ) plan for 2 wk course  - Bcx: NGTD  - Encouraged airway clearance despite minimal sputum production  - Airway clearance: chest vest   - Home pulmozyme  - Albuterol, 7% HTS   - Tylenol as needed for pain, discontinue oxycodone     Alcohol abuse: Last drink 8/19, no hx of complicated withdrawal  - D/C  CIWA  - Thiamine, folic acid, MVM    CF related diabetes mellitus: Uncontrolled, HbA1c 10.5%  - Lantus increased 30 units daily  - Lispro 12 units with meals  - He will ask his nurse if he eats another large meal and wants 12 units of lispro   - Lispro SSI QIDAC    CF related pancreatic insufficiency:   - Zenpep 12 capsules w/ meals, 4 with snacks  - MVM w/ minerals     Vitamin D Def  - Vit D 50K daily while in house  - Recheck Vit D before discharge     Hepatic steatosis: complicated by alcohol use  - Urosodiol 900 mg BID (was not taking at home, no reasoning, able to afford, no s/e)    Tinea versicolor, improving:   - Fluconazole 300 mg x 1  - Ketoconazole cream  - Diflucan 300mg  x1    Substance-induced depressive disorder: Denies SI/HI at this time. Recent death in the family. Many social barriers, including fear of losing his job if he stays for treatment.  - Psych consulted  - Continue home Zyprexa  - Zoloft inreased to 200mg   - F/u for OP psych care He can contact his insurance carrier or Designer, industrial/product, [employer] [phone]).    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 Plans to stay for the 14 days.      Objective:    Vital signs in last 24 hours:  Temp:  [36.4 ??C] 36.4 ??C  Heart Rate:  [72] 72  Resp:  [18] 18  BP: (149)/(91) 149/91  MAP (mmHg):  [105] 105  SpO2:  [94 %] 94 %    Intake/Output this shift:  I/O this shift:  In: 100 [IV Piggyback:100]  Out: -     Physical Exam:  General: NAD, sitting in bed   HEENT: MMM  Cardiovascular: Regular rate, normal rhythm. No murmurs, rubs or gallops.   Lung:  Crackles in bilateral bases, no wheezes, CTAB  Abdomen: soft, non-tender, +BS  Extremeties: WWP  Skin: Hyperpigmented papules with scaling on back, chest, arms, and abdomen  Psych: Flat affect, improved eye contact today

## 2017-02-05 NOTE — Unmapped (Signed)
Problem: Patient Care Overview  Goal: Discharge Needs Assessment  Outcome: Progressing

## 2017-02-05 NOTE — Unmapped (Signed)
Problem: Patient Care Overview  Goal: Plan of Care Review  Outcome: Progressing  Pt refusing to wear continuous pulse/ox, Md aware. CIWA score 3 this shift. No dermatology c/s at this time. Pt seen by psychiatry. Anticipate discharge 9/4.

## 2017-02-05 NOTE — Unmapped (Signed)
Patient was compliant with all inhaled scheduled medications and airway clearance today.

## 2017-02-05 NOTE — Unmapped (Signed)
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 Very somnolent today, concern about combination of ativan for alcohol withdrawal and oxycodone for pain.     Cystic fibrosis with acute pulmonary exacerbation: (F508del L and I 506T), FEV1 35.3%, followed by Dr. Lurena Nida. Exacerbation characterized by chest pain, increased DOE. Prior cultures mPsA, sPsA, H. Flu.   - CF culture: 2+ mucoid Pseudomonas  - AFB smear neg  - Ceftazidime and tobramycin (8/21- ) plan for 2 wk course  - Bcx: NGTD  - Airway clearance: chest vest  - Home pulmozyme  - Albuterol, 7% HTS  - Tylenol as needed for pain, discontinue oxycodone     Alcohol abuse: Last drink 8/19, no hx of complicated withdrawal  - CIWA  - HOLD ativan for sedation - was somnolent this afternoon. Evaluate objective withdrawal findings- tremor, tachycardia, diaphoresis.   - Thiamine, folic acid, MVM  - Tox screen 8/22: cannabinoid     CF related diabetes mellitus: Uncontrolled, HbA1c 10.5%  - Lantus increased 26 units daily  - Lispro 4 --> 6 units w/ meals  - Lispro SSI QID Ac    CF related pancreatic insufficiency:   - Zenpep 12 capsules w/ meals, 4 with snacks  - MVM w/ minerals     Hepatic steatosis: complicated by alcohol use  - Urosodiol 900 mg BID (was not taking at home, no reasoning, able to afford, no s/e)    Tinea versicolor, improving:   - Fluconazole 300 mg x 1  - Ketoconazole cream  - Diflucan 300mg  x1    Substance-induced depressive disorder: Denies SI/HI at this time. Recent death in the family. Many social barriers, including fear of losing his job if he stays for treatment.  - Psych consulted  - Continue home Zyprexa  - Zoloft inreased to 200mg   - Consider psych consult. F/u for OP psych care He can contact his insurance carrier or local Managed Care Organization, [employer] [phone]).    Subjective:    Patient stating he does not remember leaving the floor or going to his car. He states he is motivated to stay for the full course of treatment. Feels his right-sided pain has improved, but is having epigastric/RUQ pain. No changes in bowel or bladder.  Says he feels anxious and has been diaphoretic but otherwise does not note any withdrawal symptoms.     Objective:    Vital signs in last 24 hours:  Temp:  [36.2 ??C-36.4 ??C] 36.4 ??C  Heart Rate:  [72-74] 72  Resp:  [18] 18  BP: (137-149)/(91-103) 149/91  MAP (mmHg):  [105] 105  SpO2:  [91 %-95 %] 94 %    Intake/Output this shift:  No intake/output data recorded.    Physical Exam:  General: NAD, sitting in bed   HEENT: MMM,  diaphoretic  Cardiovascular: Regular rate, normal rhythm. No murmurs, rubs or gallops.   Lung: Coarse bs, crackles, poor air movement, no wheezing.   Abdomen: soft, non-tender,   Extremeties: WWP  Skin: Hyperpigmented papules with scaling on back, chest, arms, and abdomen (improving)  Psych: Flat affect, poor eye contact, withdrawn     Germani Gavilanes T. Riley Churches, DO  PGY1 - Pager: [phone]

## 2017-02-06 LAB — BASIC METABOLIC PANEL
ANION GAP: 8 mmol/L — ABNORMAL LOW (ref 9–15)
BLOOD UREA NITROGEN: 14 mg/dL (ref 7–21)
BUN / CREAT RATIO: 23
CALCIUM: 8.5 mg/dL (ref 8.5–10.2)
CO2: 30 mmol/L (ref 22.0–30.0)
CREATININE: 0.6 mg/dL — ABNORMAL LOW (ref 0.70–1.30)
EGFR MDRD AF AMER: >=60 mL/min/{1.73_m2} (ref >=60)
EGFR MDRD NON AF AMER: >=60 mL/min/{1.73_m2} (ref >=60)
POTASSIUM: 4.9 mmol/L (ref 3.5–5.0)
SODIUM: 137 mmol/L (ref 135–145)

## 2017-02-06 LAB — CBC
HEMOGLOBIN: 12.2 g/dL — ABNORMAL LOW (ref 13.5–17.5)
MEAN CORPUSCULAR HEMOGLOBIN CONC: 32.4 g/dL (ref 31.0–37.0)
MEAN CORPUSCULAR HEMOGLOBIN: 30.3 pg (ref 26.0–34.0)
MEAN CORPUSCULAR VOLUME: 93.5 fL (ref 80.0–100.0)
MEAN PLATELET VOLUME: 7.5 fL (ref 7.0–10.0)
PLATELET COUNT: 151 10*9/L (ref 150–440)
RED BLOOD CELL COUNT: 4.05 10*12/L — ABNORMAL LOW (ref 4.50–5.90)
RED CELL DISTRIBUTION WIDTH: 15.5 % — ABNORMAL HIGH (ref 12.0–15.0)
WBC ADJUSTED: 6.3 10*9/L (ref 4.5–11.0)

## 2017-02-06 LAB — PROTIME: Lab: 10.2

## 2017-02-06 LAB — HEMATOCRIT: Lab: 37.8 — ABNORMAL LOW

## 2017-02-06 LAB — CREATININE: Creatinine:MCnc:Pt:Ser/Plas:Qn:: 0.6 — ABNORMAL LOW

## 2017-02-06 LAB — MAGNESIUM: Magnesium:MCnc:Pt:Ser/Plas:Qn:: 1.8

## 2017-02-06 NOTE — Unmapped (Signed)
Patient was compliant with all inhaled scheduled medications and was under no apparent distress.

## 2017-02-06 NOTE — Unmapped (Signed)
Problem: Patient Care Overview  Goal: Plan of Care Review  Outcome: Progressing  Patient alert and oriented. On RA. Continued all meds as prescribed. Will continue POC.    Problem: Cystic Fibrosis (Adult)  Goal: Signs and Symptoms of Listed Potential Problems Will be Absent, Minimized or Managed (Cystic Fibrosis)  Signs and symptoms of listed potential problems will be absent, minimized or managed by discharge/transition of care (reference Cystic Fibrosis (Adult) CPG).   Outcome: Progressing   02/06/17 0223   Cystic Fibrosis (Adult)   Problems Assessed (Cystic Fibrosis) pulmonary/respiratory complications;situational response   Problems Present (Cystic Fibrosis) pulmonary/respiratory complications;situational response       Problem: Pain, Acute (Adult)  Goal: Acceptable Pain Control/Comfort Level  Patient will demonstrate the desired outcomes by discharge/transition of care.   Outcome: Progressing   02/06/17 0223   Pain, Acute (Adult)   Acceptable Pain Control/Comfort Level making progress toward outcome       Problem: Alcohol Withdrawal Acute, Risk/Actual (Adult)  Goal: Signs and Symptoms of Listed Potential Problems Will be Absent, Minimized or Managed (Alcohol Withdrawal Acute, Risk/Actual)  Signs and symptoms of listed potential problems will be absent, minimized or managed by discharge/transition of care (reference Alcohol Withdrawal Acute, Risk/Actual (Adult) CPG).   Outcome: Progressing   02/06/17 0223   Alcohol Withdrawal Acute, Risk/Actual (Adult)   Problems Assessed (Alcohol Withdrawal Syndrome) all   Problems Present (Alcohol W/D Syndrome) none       Problem: VTE, DVT and PE (Adult)  Goal: Signs and Symptoms of Listed Potential Problems Will be Absent, Minimized or Managed (VTE, DVT and PE)  Signs and symptoms of listed potential problems will be absent, minimized or managed by discharge/transition of care (reference VTE, DVT and PE (Adult) CPG).   Outcome: Progressing   02/06/17 0223   VTE, DVT and PE (Adult)   Problems Assessed (VTE, DVT, PE) all   Problems Present (VTE, DVT, PE) none

## 2017-02-06 NOTE — Unmapped (Signed)
Patient is currently taking Q4 albuterol and 7% treatments in addition to Pulmozyme QD. Patient tolerates well. Non compliant with airway clearance. Patient says it does not help. Plan is to continue monitoring respiratory progress.

## 2017-02-07 LAB — BASIC METABOLIC PANEL
ANION GAP: 10 mmol/L (ref 9–15)
BLOOD UREA NITROGEN: 17 mg/dL (ref 7–21)
BUN / CREAT RATIO: 29
CALCIUM: 9.1 mg/dL (ref 8.5–10.2)
CHLORIDE: 99 mmol/L (ref 98–107)
CO2: 30 mmol/L (ref 22.0–30.0)
CREATININE: 0.59 mg/dL — ABNORMAL LOW (ref 0.70–1.30)
EGFR MDRD AF AMER: >=60 mL/min/{1.73_m2} (ref >=60)
EGFR MDRD NON AF AMER: >=60 mL/min/{1.73_m2} (ref >=60)
POTASSIUM: 4.5 mmol/L (ref 3.5–5.0)
SODIUM: 139 mmol/L (ref 135–145)

## 2017-02-07 LAB — CBC
HEMOGLOBIN: 12.6 g/dL — ABNORMAL LOW (ref 13.5–17.5)
MEAN CORPUSCULAR HEMOGLOBIN CONC: 34 g/dL (ref 31.0–37.0)
MEAN CORPUSCULAR HEMOGLOBIN: 31.1 pg (ref 26.0–34.0)
MEAN CORPUSCULAR VOLUME: 91.3 fL (ref 80.0–100.0)
MEAN PLATELET VOLUME: 7.7 fL (ref 7.0–10.0)
PLATELET COUNT: 182 10*9/L (ref 150–440)
WBC ADJUSTED: 9.6 10*9/L (ref 4.5–11.0)

## 2017-02-07 LAB — CREATININE: Creatinine:MCnc:Pt:Ser/Plas:Qn:: 0.59 — ABNORMAL LOW

## 2017-02-07 LAB — PROTIME: Lab: 10.5

## 2017-02-07 LAB — MEAN PLATELET VOLUME: Lab: 7.7

## 2017-02-07 LAB — MAGNESIUM: Magnesium:MCnc:Pt:Ser/Plas:Qn:: 1.5 — ABNORMAL LOW

## 2017-02-07 LAB — TOBRAMYCIN RANDOM: Tobramycin:MCnc:Pt:Ser/Plas:Qn:: 19.7

## 2017-02-07 NOTE — Unmapped (Signed)
Patient refused first round of treatments. Has been tolerating inhaled therapy well. Patient is non compliant with airway clearance. Plan is to continue monitoring patients respiratory progress.

## 2017-02-07 NOTE — Unmapped (Signed)
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    Cystic fibrosis with acute pulmonary exacerbation: (F508del L and I 506T), FEV1 35.3%, followed by Dr. Lurena Nida. Exacerbation characterized by chest pain, increased DOE. Prior cultures mPsA, sPsA, H. Flu.   - CF culture: 2+ mucoid Pseudomonas  - AFB smear neg  - Ceftazidime and tobramycin (8/21- ) plan for 2 wk course (9/4)  - Bcx: NGTD  - Encouraged airway clearance despite minimal sputum production  - Airway clearance: chest vest   - Home pulmozyme  - Albuterol, 7% HTS   - Tylenol as needed for pain, discontinue oxycodone     Alcohol abuse: Last drink 8/19, no hx of complicated withdrawal, CIWA d/c  - Thiamine, folic acid, MVM    CF related diabetes mellitus: Uncontrolled, HbA1c 10.5%; uncontrolled BG during hospitalization given frequent untimed meals.   - Increase Lantus 36 units dialy  - Lispro 12 units with meals QID (often eats multiple meals  - Change to Lispro Resistant SSI QIDAC    CF related pancreatic insufficiency:   - Zenpep 12 capsules w/ meals, 4 with snacks  - MVM w/ minerals     Vitamin D Def  - Vit D 50K daily while in house  - Recheck Vit D before discharge     Hepatic steatosis: complicated by alcohol use  - Urosodiol 900 mg BID (was not taking at home, no reasoning, able to afford, no s/e)    Tinea versicolor, improving:   - Fluconazole 300 mg x 1  - Ketoconazole cream  - Diflucan 300mg  x1    Substance-induced depressive disorder: Improved mood and interactions  - Psych consulted  - Continue home Zyprexa  - Zoloft inreased to 200mg   - F/u for OP psych care He can contact his insurance carrier or Designer, industrial/product, [employer] [phone]).    Hep C Ab positive: Positive in the past, RNA pending    Subjective:  No issues overnight, intermittently uses oxygen because he feels short of breath but is not desaturation. He is eating high carb foods including cake which is at bedside.     Objective:    Vital signs in last 24 hours:  Temp:  [36.9 ??C-37.1 ??C] 36.9 ??C  Heart Rate:  [69-98] 79  Resp:  [16-20] 18  BP: (130-145)/(83-85) 130/83  MAP (mmHg):  [93-94] 94  SpO2:  [92 %-94 %] 93 %    Intake/Output this shift:  No intake/output data recorded.    Physical Exam:  General: NAD, sitting in bed   HEENT: MMM  Cardiovascular: Regular rate, normal rhythm. No murmurs, rubs or gallops.   Lung: Coarse BS in L>R  Abdomen: soft, non-tender, +BS  Extremeties: WWP  Skin: Hyperpigmented papules with  scaling on back, chest, arms, and abdomen  Psych: More interactive, smiles

## 2017-02-07 NOTE — Unmapped (Signed)
Problem: Patient Care Overview  Goal: Plan of Care Review  Outcome: Progressing    Goal: Individualization and Mutuality  Outcome: Progressing    Goal: Discharge Needs Assessment  Outcome: Progressing    Goal: Interprofessional Rounds/Family Conf  Outcome: Progressing  Pt is stable. BS ave been much better today. No c/o resp distress. Pt to get PICC tomorrow.

## 2017-02-07 NOTE — Unmapped (Signed)
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    Cystic fibrosis with acute pulmonary exacerbation: (F508del L and I 506T), FEV1 35.3%, followed by Dr. Lurena Nida. Exacerbation characterized by chest pain, increased DOE. Prior cultures mPsA, sPsA, H. Flu.   - CF culture: 2+ mucoid Pseudomonas  - AFB smear neg  - Ceftazidime and tobramycin (8/21- ) plan for 2 wk course (9/4)  - Bcx: NGTD  - Encouraged airway clearance despite minimal sputum production  - Airway clearance: chest vest - still in plastic; encouraged to use  - Home pulmozyme  - Albuterol, 7% HTS   - Tylenol as needed for pain, discontinue oxycodone   - PICC placement pending - requesting general anesthesia d/t hx of traumatic placement, has been sedated using Versed and Fentanyl in the past    Alcohol abuse: Last drink 8/19, no hx of complicated withdrawal, CIWA d/c  - Thiamine, folic acid, MVM    CF related diabetes mellitus: Uncontrolled, HbA1c 10.5%; uncontrolled BG during hospitalization given frequent untimed meals.   - Increase Lantus 36 units dialy  - Lispro 12 units with meals QID (often eats multiple meals; needed 30U on 8/25 d/t >500 blood sugar  - Change to Lispro Resistant SSI QIDAC    CF related pancreatic insufficiency:   - Zenpep 12 capsules w/ meals, 4 with snacks  - MVM w/ minerals     Vitamin D Def  - Vit D 50K daily while in house  - Recheck Vit D before discharge     Hepatic steatosis: complicated by alcohol use  - Urosodiol 900 mg BID (was not taking at home, no reasoning, able to afford, no s/e)    Tinea versicolor, improving:   - Fluconazole 300 mg x 1  - Ketoconazole cream  - Diflucan 300mg  x1    Substance-induced depressive disorder: Improved mood and interactions  - Psych consulted  - Continue home Zyprexa  - Zoloft inreased to 200mg   - F/u for OP psych care He can contact his insurance carrier or Designer, industrial/product, [employer] [phone]).    Hep C Ab positive: Positive in the past, RNA pending    Subjective:  Felt more SOB last night when walking to bathroom. Felt better on 3L [place].     Objective:    Vital signs in last 24 hours:  Temp:  [35.4 ??C-36.6 ??C] 35.4 ??C  Heart Rate:  [71-82] 81  Resp:  [16-18] 18  BP: (134-161)/(65-94) 134/65  MAP (mmHg):  [82-107] 82  SpO2:  [93 %-96 %] 96 %    Intake/Output this shift:  No intake/output data recorded.    Physical Exam:  General: NAD, sitting in bed   HEENT: MMM  Cardiovascular: Regular rate, normal rhythm. No  murmurs, rubs or gallops.   Lung: Coarse breath sounds, normal WOB on RA  Extremeties: WWP  Skin: Hyperpigmented papules with scaling on back, chest, arms, and abdomen  Psych: More interactive, smiles    Walter Neace T. Riley Churches, DO  PGY1 - Pager: [phone]

## 2017-02-07 NOTE — Unmapped (Signed)
Problem: Pain, Acute (Adult)  Goal: Identify Related Risk Factors and Signs and Symptoms  Related risk factors and signs and symptoms are identified upon initiation of Human Response Clinical Practice Guideline (CPG).   Outcome: Progressing    Goal: Acceptable Pain Control/Comfort Level  Patient will demonstrate the desired outcomes by discharge/transition of care.   Outcome: Progressing  Pt is stable with no s/s of resp distress. Pt has had no c/o pain. Pt compliant with all treatments.

## 2017-02-07 NOTE — Unmapped (Signed)
Problem: Cystic Fibrosis (Adult)  Goal: Signs and Symptoms of Listed Potential Problems Will be Absent, Minimized or Managed (Cystic Fibrosis)  Signs and symptoms of listed potential problems will be absent, minimized or managed by discharge/transition of care (reference Cystic Fibrosis (Adult) CPG).   Patient received inhaled medications without complications. RT will continue to monitor.

## 2017-02-08 LAB — BASIC METABOLIC PANEL
ANION GAP: 10 mmol/L (ref 9–15)
BLOOD UREA NITROGEN: 13 mg/dL (ref 7–21)
CHLORIDE: 99 mmol/L (ref 98–107)
CO2: 30 mmol/L (ref 22.0–30.0)
CREATININE: 0.56 mg/dL — ABNORMAL LOW (ref 0.70–1.30)
EGFR MDRD AF AMER: >=60 mL/min/{1.73_m2} (ref >=60)
EGFR MDRD NON AF AMER: >=60 mL/min/{1.73_m2} (ref >=60)
GLUCOSE RANDOM: 128 mg/dL (ref 65–179)
POTASSIUM: 3.9 mmol/L (ref 3.5–5.0)
SODIUM: 139 mmol/L (ref 135–145)

## 2017-02-08 LAB — TOBRAMYCIN RANDOM
Tobramycin:MCnc:Pt:Ser/Plas:Qn:: 18.4
Tobramycin:MCnc:Pt:Ser/Plas:Qn:: 18.9

## 2017-02-08 LAB — CBC
HEMATOCRIT: 39.1 % — ABNORMAL LOW (ref 41.0–53.0)
HEMOGLOBIN: 13.2 g/dL — ABNORMAL LOW (ref 13.5–17.5)
MEAN CORPUSCULAR HEMOGLOBIN CONC: 33.8 g/dL (ref 31.0–37.0)
MEAN CORPUSCULAR HEMOGLOBIN: 30.7 pg (ref 26.0–34.0)
MEAN PLATELET VOLUME: 8 fL (ref 7.0–10.0)
PLATELET COUNT: 195 10*9/L (ref 150–440)
RED CELL DISTRIBUTION WIDTH: 15.3 % — ABNORMAL HIGH (ref 12.0–15.0)
WBC ADJUSTED: 7 10*9/L (ref 4.5–11.0)

## 2017-02-08 LAB — INR: Lab: 0.98

## 2017-02-08 LAB — MAGNESIUM: Magnesium:MCnc:Pt:Ser/Plas:Qn:: 1.5 — ABNORMAL LOW

## 2017-02-08 LAB — MEAN CORPUSCULAR HEMOGLOBIN CONC: Lab: 33.8

## 2017-02-08 LAB — TOBRAMYCIN TROUGH: Tobramycin^trough:MCnc:Pt:Ser/Plas:Qn:: 2.1 — ABNORMAL HIGH

## 2017-02-08 LAB — CREATININE: Creatinine:MCnc:Pt:Ser/Plas:Qn:: 0.56 — ABNORMAL LOW

## 2017-02-08 LAB — PROTIME-INR: INR: 0.98

## 2017-02-08 NOTE — Unmapped (Signed)
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    Cystic fibrosis with acute pulmonary exacerbation: (F508del L and I 506T), FEV1 35.3%, followed by Dr. Lurena Nida. Exacerbation characterized by chest pain, increased DOE. Prior cultures mPsA, sPsA, H. Flu.   - CF culture: 2+ mucoid Pseudomonas  - AFB smear neg  - Ceftazidime and tobramycin (8/21- ) plan for 2 wk course (9/4)  - Bcx: NGTD  - Encouraged airway clearance despite minimal sputum production  - Airway clearance: chest vest - still in plastic; encouraged to use.  - Home pulmozyme  - Albuterol, 7% HTS   - Tylenol as needed for pain, discontinue oxycodone   - PICC placement pending - requesting general anesthesia d/t hx of traumatic placement, has been sedated using Versed and Fentanyl in the past. VIR scheduled for 8/28 or 8/29, Labs up-to-date and NPO starting midnight tonight.     Alcohol abuse: Last drink 8/19, no hx of complicated withdrawal, CIWA d/c  - Thiamine, folic acid, MVM    CF related diabetes mellitus: Uncontrolled, HbA1c 10.5%; uncontrolled BG during hospitalization given frequent untimed meals.   - Increased Lantus 36 units dialy  - Lispro 12 units with meals QID (often eats multiple meals)  - SSI      CF related pancreatic insufficiency:   - Zenpep 12 capsules w/ meals, 4 with snacks  - MVM w/ minerals     Vitamin D Def  - Vit D 50K daily while in house  - Recheck Vit D before discharge     Hepatic steatosis: complicated by alcohol use  - Urosodiol 900 mg BID (was not taking at home, no reasoning, able to afford, no s/e)    Tinea versicolor, improving:   - Fluconazole 300 mg x 1  - Ketoconazole cream  - Diflucan 300mg  x1    Substance-induced depressive disorder: Improved mood and interactions  - Psych consulted  - Continue home Zyprexa  - Zoloft inreased to 200mg   - F/u for OP psych care He can contact his insurance carrier or Designer, industrial/product, [employer] [phone]).    Hep C Ab positive: Positive in the past, RNA pending    Subjective:  Did not need O2 overnight. No complaints. Not using Chest Vest yet but may try today.     Objective:    Vital signs in last 24 hours:  Temp:  [35.7 ??C-36.3 ??C] 35.8 ??C  Heart Rate:  [63-84] 63  Resp:  [16-18] 18  BP: (102-132)/(60-90) 112/60  MAP (mmHg):  [69-101] 78  SpO2:  [91 %-96 %] 91 %    Intake/Output this shift:  No intake/output data recorded.    Physical Exam:  General: NAD, sleeping in bed   HEENT: MMM  Cardiovascular:  Regular rate, normal rhythm. No murmurs, rubs or gallops.   Lung: Coarse breath sounds, normal WOB on RA  Extremeties: WWP  Skin: Hyperpigmented papules with scaling on back, chest, arms, and abdomen  Neuro: alert    [name], DO  PGY1 - Pager: [phone]    ______________________________________________________________________    Teaching Attending Attestation     I saw the patient with Dr. Riley Churches. We formulated a joint history as documented above. I examined the patient fully, evaluated the test results, and formulated the above assessment and plan as documented in the resident note. I agree with the plan for treatment and follow up as outlined.     Active Inpatient Problem List includes:  1. Acute exacerbation of cystic fibrosis due to pseudomonas infection  2. Poor compliance with airway clearance  3. History of alcohol abuse, pre-contemplative stage of change  4. CF related diabetes, on insulin  5. Hypomagnesemia  6. Pancreatic insufficiency    Plan for today includes: Continue IV ABX (ceftaz/tobra) through 9/4; needs entire course in house due to compliance history. Discussed his alcohol abuse at length today; patient is aware of the negative effects it is having on his health (as well as socio-economics; cannot hold down a job). Was in rehab in 2016 for 9 months but started using again shortly thereafter. Plan for line placement with VIR; continue FSBG checks for CF related DM. Social work consult.     I personally spent over half of a total 25 minutes in counseling and discussion with the patient and coordination of care as described above. Patient updated at bedside.    Harrel Carina, MD  Pulmonary/Critical Care Medicine

## 2017-02-08 NOTE — Unmapped (Signed)
Aminoglycoside Follow-Up Pharmacy Note    Walter [name] is a 36 y.o. male on tobramycin 600 mg IV  every 24 hours for CF exacerbation.  Currently on day 6 of therapy.     High dose CF extended infusion (1 hr)  Goal peak: 20-30 mg/L  Goal trough: <0.6 mg/L (6-8 hr drug free interval)    ?? Ref. Range 02/01/2017 08:42 02/02/2017 05:34 02/03/2017 05:35 8/23 8/24 8/25 8/26   Bun Latest Ref Range: 7 - 21 mg/dL 12 14 15 12 11 14 17    Creatinine Latest Ref Range: 0.70 - 1.30 mg/dL 1.47 8.29 5.62 1.30 8.65 0.60 0.59       Measured peak = 19.7 mg/L  Calculated peak = 22.8 mg/L    Measured trough/random = patient refused 1700 lab draw; nurse explained to patient the team needs to make sure his medication is dosed correctly, patient still refused. Nurse notified primary team.    Based on levels, recommend to continue tobramycin 600 mg q24h as his renal function is stable and has previously tolerated this dose well. Would recommend rechecking labs tomorrow after discussion with patient.    Pharmacy will continue to monitor for changes in renal function, toxicity, and efficacy and order additional levels as needed. Please page service pharmacist with questions/clarifications.    Sharma Covert, PharmD  PGY1 Pharmacy Resident  Pager: [phone]

## 2017-02-08 NOTE — Unmapped (Signed)
Problem: Patient Care Overview  Goal: Plan of Care Review  Outcome: Progressing  Receiving medications as ordered and denies adverse effects.  Compliant with RT and airway clearance.  Denies pain.  Patient has been more compliant today about calling for insulin when he is eating.  Otherwise, patient has been resting in bed with no complaints.  Activity encouraged.  Continue POC.  Goal: Discharge Needs Assessment  Outcome: Progressing   02/02/17 1637 02/02/17 1800 02/03/17 1600   OTHER   Equipment Currently Used at Home other (see comments)  (Vest and Nebulizer) --  --    Patient and/or family were provided with choice of facilities / services that are available and appropriate to meet post hospital care needs? Other (Comment)  (Case Manager will provide choices as appropriate) --  --    Discharge Needs Assessment   Concerns to be Addressed --  --  --    Readmission Within the Last 30 Days no previous admission in last 30 days --  --    Patient/Family Anticipates Transition to --  home --    Anticipated Changes Related to Illness --  inability to work  (employed, but not able to work 2/2 being hospitalized) --    Equipment Needed After Discharge none --  --    Current Discharge Risk --  --  chronically ill;substance use/abuse    02/04/17 1725   OTHER   Equipment Currently Used at Home --    Patient and/or family were provided with choice of facilities / services that are available and appropriate to meet post hospital care needs? --    Discharge Needs Assessment   Concerns to be Addressed denies needs/concerns at this time   Readmission Within the Last 30 Days --    Patient/Family Anticipates Transition to --    Anticipated Changes Related to Illness --    Equipment Needed After Discharge --    Current Discharge Risk --        Problem: Cystic Fibrosis (Adult)  Goal: Signs and Symptoms of Listed Potential Problems Will be Absent, Minimized or Managed (Cystic Fibrosis)  Signs and symptoms of listed potential problems will be absent, minimized or managed by discharge/transition of care (reference Cystic Fibrosis (Adult) CPG).   Outcome: Progressing   02/08/17 1525   Cystic Fibrosis (Adult)   Problems Present (Cystic Fibrosis) impaired glycemic control;infection;malabsorption/maldigestion;pulmonary/respiratory complications;undernutrition/vitamin and mineral deficiency       Problem: VTE, DVT and PE (Adult)  Goal: Signs and Symptoms of Listed Potential Problems Will be Absent, Minimized or Managed (VTE, DVT and PE)  Signs and symptoms of listed potential problems will be absent, minimized or managed by discharge/transition of care (reference VTE, DVT and PE (Adult) CPG).   Outcome: Progressing

## 2017-02-08 NOTE — Unmapped (Signed)
Patient was compliant with evening medications, however, refused airway clearance. Will continue to monitor.

## 2017-02-08 NOTE — Unmapped (Signed)
VASCULAR INTERVENTIONAL RADIOLOGY INPATIENT CVC CONSULTATION     Requesting Attending Physician: Konrad Dolores, *  Service Requesting Consult: Pulmonology (MDG)    Date of Service: 02/08/2017  Consulting Interventional Radiologist: Dr. Alla German     HPI:     Reason for consult: PICC line placment, single lumen    History of Present Illness:   Walter [name] is a 36 y.o. 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    Review of Systems:  Pertinent items are noted in HPI.    Medical History:     Past Medical History:  Past Medical History:   Diagnosis Date   ??? Alcohol abuse    ??? Bipolar disorder (CMS-HCC)    ??? Chronic pancreatitis (CMS-HCC)    ??? Chronic sinusitis    ??? 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 Cystic fibrosis (CMS-HCC)    ??? Diabetes mellitus (CMS-HCC)     dx in-house 2015   ??? Kidney stones    ??? Portal hypertension (CMS-HCC)        Surgical History:  Past Surgical History:   Procedure Laterality Date   ??? CHOLECYSTECTOMY  2008   ??? sinus surgery     ??? TONSILLECTOMY         Family History:  Family History   Problem Relation Age of Onset   ??? Diabetes Maternal Grandmother    ??? Diabetes Maternal Grandfather    ??? No Known Problems Mother    ??? Cancer Father    ??? No Known Problems Sister        Medications:   Current Facility-Administered Medications   Medication Dose Route Frequency Provider Last Rate Last Dose   ??? acetaminophen (TYLENOL) tablet 650 mg  650 mg Oral Q4H PRN Marguerite Olea, MD   650 mg at 02/05/17 2112   ??? albuterol (PROVENTIL HFA;VENTOLIN HFA) 90 mcg/actuation inhaler 2 puff  2 puff Inhalation Q6H PRN Jeanine Luz, DO       ??? albuterol 2.5 mg /3 mL (0.083 %) nebulizer solution 2.5 mg  2.5 mg Nebulization 4x Daily (RT) Kaila T Yeste, DO   2.5 mg at 02/07/17 2037   ??? calcium carbonate (OS-CAL) tablet 600 mg of elem calcium  600 mg of elem calcium Oral Daily Marguerite Olea, MD   600 mg of elem calcium at 02/07/17 0900   ??? cefTAZidime (FORTAZ) 2 g in sodium chloride 0.9 % (NS) 100 mL IVPB-MBP  2 g Intravenous Q8H Konrad Dolores, MD       ??? dextrose 50 % in water (D50W) solution 12.5 g  12.5 g Intravenous Q10 Min PRN Jeanine Luz, DO       ??? dextrose 50 % in water (D50W) solution 12.5 g  12.5 g Intravenous Q10 Min PRN Truett Mainland, MD       ??? dornase alfa (PULMOZYME) 1 mg/mL nebulizer solution 2.5 mg  2.5 mg Inhalation Daily (RT) Jeanine Luz, DO   2.5 mg at 02/07/17 1015   ??? ergocalciferol (DRISDOL) capsule 50,000 Units  50,000 Units Oral Weekly Marguerite Olea, MD   50,000 Units at 02/04/17 1643   ??? ergocalciferol (DRISDOL) capsule 50,000 Units  50,000 Units Oral Daily Truett Mainland, MD   50,000 Units at 02/07/17 8119   ??? folic acid (FOLVITE)  tablet 1 mg  1 mg Oral Daily Jeanine Luz, DO   1 mg at 02/07/17 1610   ??? gabapentin (NEURONTIN) capsule 400 mg  400 mg Oral TID Jeanine Luz, DO   400 mg at 02/07/17 2041   ??? insulin glargine (LANTUS) injection 36 Units  36 Units Subcutaneous Daily Marguerite Olea, MD   36 Units at 02/07/17 [phone]   ??? insulin lispro (HumaLOG) injection 0-20 Units  0-20 Units Subcutaneous ACHS Truett Mainland, MD   6 Units at 02/07/17 2216   ??? insulin lispro (HumaLOG) injection 12 Units  12 Units Subcutaneous ACHS Marguerite Olea, MD   12 Units at 02/07/17 2215   ??? ketoconazole (NIZORAL) 2 % cream 1 application  1 application Topical Daily Jeanine Luz, DO   1 application at 02/06/17 5409   ??? ketoconazole (NIZORAL) 2 % shampoo   Topical Daily PRN Jeanine Luz, DO       ??? lidocaine (LIDODERM) 5 % patch 1 patch  1 patch Transdermal Daily Marguerite Olea, MD   1 patch at 02/02/17 1858   ??? lipase-protease-amylase (ZENPEP) 20,000-63,000- 84,000 unit capsule, delayed release 240,000 units of lipase  12 capsule Oral 3xd Meals Jeanine Luz, DO   240,000 units of lipase at 02/07/17 1244   ??? lipase-protease-amylase (ZENPEP) 20,000-63,000- 84,000 unit capsule, delayed release 80,000 units of lipase  4 capsule Oral With snacks Jeanine Luz, DO       ??? magnesium oxide (MAG-OX) tablet 800 mg  800 mg Oral BID Marguerite Olea, MD       ??? MVW Complete (pediatric multivit 61-D3-vit K) 1,500-800 unit-mcg 2 capsule  2 capsule Oral Daily Truett Mainland, MD   2 capsule at 02/07/17 0904   ??? OLANZapine (ZYPREXA) tablet 15 mg  15 mg Oral Nightly Jeanine Luz, DO   15 mg at 02/07/17 2041   ??? ondansetron (ZOFRAN-ODT) disintegrating tablet 4 mg  4 mg Oral Q8H PRN Jeanine Luz, DO       ??? pantoprazole (PROTONIX) EC tablet 40 mg  40 mg Oral Daily Jeanine Luz, DO   40 mg at 02/07/17 8119   ??? phytonadione (vitamin K1) (MEPHYTON) tablet 5 mg  5 mg Oral Tue,Sat Truett Mainland, MD   5 mg at 02/07/17 0000   ??? polyethylene glycol (MIRALAX) packet 17 g  17 g Oral Daily Jeanine Luz, DO   Stopped at 02/01/17 1530   ??? sertraline (ZOLOFT) tablet 200 mg  200 mg Oral Daily Marguerite Olea, MD   200 mg at 02/07/17 1478   ??? sodium chloride 7% nebulizer solution 4 mL  4 mL Nebulization 4x Daily (RT) Jeanine Luz, DO   4 mL at 02/07/17 2037   ??? thiamine (B-1) tablet 100 mg  100 mg Oral Daily Jeanine Luz, DO   100 mg at 02/07/17 2956   ??? tobramycin (NEBCIN) 600 mg in sodium chloride (NS) 0.9% 100 mL IVPB  600 mg Intravenous Q24H Jeanine Luz, DO   Stopped at 02/07/17 1003   ??? ursodiol (ACTIGALL) capsule 900 mg  900 mg Oral BID Marguerite Olea, MD   900 mg at 02/07/17 2041       Allergies:  Quetiapine    Social History:  Social History   Substance Use Topics   ??? Smoking status: Former Smoker     Types: Cigarettes   ??? Smokeless tobacco:  Never Used   ??? Alcohol use 9.6 oz/week     16 Cans of beer per week       Objective:      Vital Signs:  Temp:  [35.7 ??C (96.3 ??F)-36.3 ??C (97.4 ??F)] 35.8 ??C (96.5 ??F)  Heart Rate:  [63-84] 63  Resp:  [16-18] 18  BP: (102-132)/(60-90) 112/60  MAP (mmHg): [69-101] 78  SpO2:  [91 %-96 %] 91 %    Physical Exam:  ASA Grade: ASA 2 - Patient with mild systemic disease with no functional limitations  GEN: No acute distress, nontoxic appearing  Pulm: Normal work of breathing.  EXT: No clubbing cyanosis or edema   PSYCH: Alert   NEURO: No obvious focal deficits.    Airway assessment: Class 2 - Can visualize soft palate and fauces, tip of uvula is obscured      Diagnostic Studies:  I reviewed all pertinent diagnostic studies, including:  fluoroscopic images from prior procedures    Labs:    Recent Labs      02/06/17   0639  02/07/17   0514  02/08/17   0522   WBC  6.3  9.6  7.0   HGB  12.2*  12.6*  13.2*   HCT  37.8*  36.9*  39.1*   PLT  151  182  195     Recent Labs      02/06/17   0639  02/07/17   0514  02/08/17   0522   NA  137  139  139   K  4.9  4.5  3.9   CL  99  99  99   BUN  14  17  13    CREATININE  0.60*  0.59*  0.56*   GLU  466*  210*  128     No results for input(s): PROT, ALBUMIN, AST, ALT, ALKPHOS, BILITOT in the last 72 hours.    Invalid input(s):  BILIDIR  Recent Labs      02/06/17   1517  02/07/17   0514  02/08/17   0522   INR  0.89  0.92  0.98       Blood Cultures Pending:  No.  Does Anticoagulation need to be held:  No.    Assessment and Recommendations:     Mr. Bangura is a 36 y.o. male cystic fibrosis with exacerbation.    Recommendations:  - Proceed with placement of PICC line- single lumen  - Anticipated procedure date: Tuesday 8/28 or Wednesday 8/29  - Please make NPO night prior to procedure  - Please ensure recent CBC, Creatinine, and INR are available    Informed Consent:  This procedure has been fully reviewed with the patient/patient???s authorized representative. The risks, benefits and alternatives have been explained, and the patient/patient???s authorized representative has consented to the procedure.  --The patient will accept blood products in an emergent situation.  --The patient does not have a Do Not Resuscitate order in effect.    The patient was discussed with Dr. Dr. Alla German.     Thank you for involving [place] in the care of this patient. Please page the VIR consult pager [phone]) with further questions, concerns, or if new issues arise.

## 2017-02-08 NOTE — Unmapped (Signed)
Problem: Patient Care Overview  Goal: Plan of Care Review  Outcome: Progressing  Patient A&O x 3. On RA. Patient continues on contact precautions. Continued all meds as prescribed. Patient remained free of falls and pain. Will continue POC.    02/08/17 0420   OTHER   Plan of Care Reviewed With patient   Plan of Care Review   Progress improving       Problem: Cystic Fibrosis (Adult)  Goal: Signs and Symptoms of Listed Potential Problems Will be Absent, Minimized or Managed (Cystic Fibrosis)  Signs and symptoms of listed potential problems will be absent, minimized or managed by discharge/transition of care (reference Cystic Fibrosis (Adult) CPG).   Outcome: Progressing   02/08/17 0420   Cystic Fibrosis (Adult)   Problems Assessed (Cystic Fibrosis) pulmonary/respiratory complications;situational response   Problems Present (Cystic Fibrosis) pulmonary/respiratory complications;situational response       Problem: Pain, Acute (Adult)  Goal: Acceptable Pain Control/Comfort Level  Patient will demonstrate the desired outcomes by discharge/transition of care.   Outcome: Progressing   02/08/17 0420   Pain, Acute (Adult)   Acceptable Pain Control/Comfort Level making progress toward outcome       Problem: Alcohol Withdrawal Acute, Risk/Actual (Adult)  Goal: Signs and Symptoms of Listed Potential Problems Will be Absent, Minimized or Managed (Alcohol Withdrawal Acute, Risk/Actual)  Signs and symptoms of listed potential problems will be absent, minimized or managed by discharge/transition of care (reference Alcohol Withdrawal Acute, Risk/Actual (Adult) CPG).   Outcome: Resolved Date Met: 02/08/17   02/08/17 0420   Alcohol Withdrawal Acute, Risk/Actual (Adult)   Problems Assessed (Alcohol Withdrawal Syndrome) all   Problems Present (Alcohol W/D Syndrome) none       Problem: VTE, DVT and PE (Adult)  Goal: Signs and Symptoms of Listed Potential Problems Will be Absent, Minimized or Managed (VTE, DVT and PE)  Signs and symptoms of listed potential problems will be absent, minimized or managed by discharge/transition of care (reference VTE, DVT and PE (Adult) CPG).   Outcome: Progressing   02/08/17 0420   VTE, DVT and PE (Adult)   Problems Assessed (VTE, DVT, PE) all   Problems Present (VTE, DVT, PE) none

## 2017-02-09 DIAGNOSIS — J189 Pneumonia, unspecified organism: Principal | ICD-10-CM

## 2017-02-09 LAB — INR: Lab: 0.96

## 2017-02-09 LAB — HCV RNA(IU): Hepatitis C virus RNA:ACnc:Pt:Ser/Plas:Qn:Probe.amp.tar: 0

## 2017-02-09 LAB — CBC
HEMATOCRIT: 38.9 % — ABNORMAL LOW (ref 41.0–53.0)
HEMOGLOBIN: 13 g/dL — ABNORMAL LOW (ref 13.5–17.5)
MEAN CORPUSCULAR HEMOGLOBIN CONC: 33.5 g/dL (ref 31.0–37.0)
MEAN CORPUSCULAR HEMOGLOBIN: 30.8 pg (ref 26.0–34.0)
MEAN CORPUSCULAR VOLUME: 91.9 fL (ref 80.0–100.0)
MEAN PLATELET VOLUME: 7.6 fL (ref 7.0–10.0)
PLATELET COUNT: 186 10*9/L (ref 150–440)
RED BLOOD CELL COUNT: 4.23 10*12/L — ABNORMAL LOW (ref 4.50–5.90)

## 2017-02-09 LAB — POTASSIUM: Potassium:SCnc:Pt:Ser/Plas:Qn:: 4.7

## 2017-02-09 LAB — BASIC METABOLIC PANEL
ANION GAP: 10 mmol/L (ref 9–15)
BLOOD UREA NITROGEN: 14 mg/dL (ref 7–21)
BUN / CREAT RATIO: 21
CHLORIDE: 99 mmol/L (ref 98–107)
CO2: 30 mmol/L (ref 22.0–30.0)
CREATININE: 0.66 mg/dL — ABNORMAL LOW (ref 0.70–1.30)
EGFR MDRD AF AMER: >=60 mL/min/{1.73_m2} (ref >=60)
EGFR MDRD NON AF AMER: >=60 mL/min/{1.73_m2} (ref >=60)
GLUCOSE RANDOM: 310 mg/dL — ABNORMAL HIGH (ref 65–179)
POTASSIUM: 4.7 mmol/L (ref 3.5–5.0)
SODIUM: 139 mmol/L (ref 135–145)

## 2017-02-09 LAB — PROTIME-INR
INR: 0.96
PROTIME: 10.9 s (ref 10.2–12.8)

## 2017-02-09 LAB — MAGNESIUM: Magnesium:MCnc:Pt:Ser/Plas:Qn:: 1.7

## 2017-02-09 LAB — HEPATITIS C RNA, QUANTITATIVE, PCR: HCV RNA: NOT DETECTED

## 2017-02-09 LAB — MEAN CORPUSCULAR VOLUME: Lab: 91.9

## 2017-02-09 NOTE — Unmapped (Signed)
Patient tolerated morning and noon treatments and airway clearance without complication, but refused 1600 treatments.

## 2017-02-09 NOTE — Unmapped (Addendum)
[facility]  Follow-Up Psychiatry Consult    Service Date: February 09, 2017  LOS:  LOS: 8 days      Assessment:   Walter [name] is a 36 y.o. 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 Psychiatry was consulted by Konrad Dolores, * with Pulmonology (MDG) for Medication management.  Patient is being seen today for these issues and behavioral concerns.    Today, patient's presentation remains most consistent with alcohol use disorder and substance-induced depressive disorder with concern for adjustment disorder with mixture of emotions and conduct in the setting of continued hospitalization given intermittent refusals of care.  No further episodes of elopement or odd behavior.  Treatment refusals are likely due to frustration with continued admission and longstanding medical illness as he does not believe he needs them at such frequency given experience.  Do not recommend any medication changes at this time given adequate symptom management per patient report, although we would recommend elimination of any unnecessary tests/treatments to decrease frustration.  If patient acts oddly or elopes from the unit in the future, would recommend collecting UDS and BAC.     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    Recommendations:   -- Safety Recommendations: If pt attempts to leave against medical advice and it is felt to be unsafe for them to leave, please call a Behavioral Response and page Psychiatry at [phone]     Medication Recommendations:  - Continue zoloft 200 mg qday  - Continue zyprexa 15 mg qhs  - Continue gabapentin  - Continue thiamine, folic acid, MVI  - Patient uninterested in naltrexone, antabuse, or any kind of treatment for alcohol use disorder  - Recommend re-evaluating necessity of medical treatment and minimization of unnecessary interventions (ie IV fluids) given report of patient frustration related to unhooking IVs for ambulation    Medical Decision Making Capacity: not formally assessed    Further Work-up:   - If patient has further episodes of odd behavior or elopement would recommend UDS and BAC    Disposition:   - Patient can be safely discharged when medically appropriate.  - Recommend pt have outpatient psychiatric care. He can contact his insurance carrier or Designer, industrial/product, [employer] [phone]).    Behavioral Recommendations:  The patient's life experiences have resulted in significant difficulty in establishing a therapeutic alliance with medical providers and frequent feelings of abandonment, consequently creating a frustrating environment in which to provide medical care to the patient. Utilizing compassion and acknowledging the patient's experiences, while setting clear and realistic expectations for care  will both assist the patient and providers in this challenging case.   ?? To minimize splitting of staff, assign one staff person (likely primary resident) as the individual that communicates all information from the team.  ?? Validate patient's anxieties and emotions. Do not validate the invalid; instead validate feelings, and correct assumptions.  ?? Patients with borderline personality traits/disorder often use the language of physical pain to communicate both physical and emotional suffering. It is important to address pain complaints as they arise and attempt to identify an etiology, either organic or psychiatric. In patient's with chronic pain, it is important to have a discussion with the patient about expectations about pain control.    Thank you for this consult request. Recommendations have been communicated to the primary team.  We will follow as needed at this time. Please page [phone] (after hours) for any questions or concerns.     Discussed with and seen by Fellow Elnita Maxwell, MD.  Discussed with and seen by Attending Lucienne Capers, MD, who agrees with the assessment and plan.     Rose Phi, MD    I saw and evaluated the patient, participating in the key portions of the service.  I reviewed the resident???s note.  I agree with the resident???s findings and plan.     Rose Phi, MD      Subjective:   Updates since last seen by psychiatry: Patient has apologized repeatedly for elopement episode with no further incidents.  Reported to be intermittently refusing breathing treatments.     Patient Report:   [name] of frustration related to too many breathing treatments daily, I only need 2 per day. Speaks of current plan for hospitalization and received port prior to interaction on 02/09/17. Speaks of prior history of depression and mood elevation- starting zyprexa approx 1 year ago. No side effects from restarting current meds. No current withdrawal sx.     Collateral:  Spoke with RN who confirmed patient was intermittently refusing breathing treatments and unhooking his IV bag without alerting staff.  No other odd behavior or elopement concerns.    ROS:  MSK: endorses rib pain  Neuro: endorses feeling tremulous  Comprehensive ROS negative except as documented above    Current Medications:  Scheduled Meds:  ??? albuterol  2.5 mg Nebulization 4x Daily (RT)   ??? calcium carbonate  600 mg of elem calcium Oral Daily   ??? cefTAZidime  2 g Intravenous Q8H   ??? dornase alfa  2.5 mg Inhalation Daily (RT)   ??? ergocalciferol  50,000 Units Oral Weekly   ??? ergocalciferol  50,000 Units Oral Daily   ??? folic acid  1 mg Oral Daily   ??? gabapentin  400 mg Oral TID   ??? heparin, porcine (PF)  200 Units Intravenous Q24H [facility]   ??? insulin glargine  36 Units Subcutaneous Daily   ??? insulin lispro  0-12 Units Subcutaneous ACHS   ??? insulin lispro  12 Units Subcutaneous ACHS   ??? ketoconazole  1 application Topical Daily   ??? lipase-protease-amylase  12 capsule Oral 3xd Meals   ??? MVW Complete (pediatric multivit 61-D3-vit K)  2 capsule Oral Daily   ??? OLANZapine  15 mg Oral Nightly   ??? pantoprazole  40 mg Oral Daily   ??? phytonadione (vitamin K1)  5 mg Oral Tue,Sat   ??? polyethylene glycol  17 g Oral Daily   ??? sertraline  200 mg Oral Daily   ??? sodium chloride 7%  4  mL Nebulization 4x Daily (RT)   ??? thiamine  100 mg Oral Daily   ??? tobramycin (NEBCIN) IVPB (conventional dosing)  600 mg Intravenous Q24H   ??? ursodiol  900 mg Oral BID     Continuous Infusions:  PRN Meds:.acetaminophen, albuterol, dextrose 50 % in water (D50W), ketoconazole, lipase-protease-amylase, ondansetron      Objective:   Vital signs:   Temp:  [36.8 ??C (98.3 ??F)] 36.8 ??C (98.3 ??F)  Heart Rate:  [63-83] 66  Resp:  [14-19] 16  BP: (111-136)/(71-94) 112/76  MAP (mmHg):  [84-109] 84  SpO2:  [90 %-96 %] 96 %    Mental Status Exam:  Appearance:  appears stated age and lying in bed   Attitude:   disinterested in interview, at times irritable   Behavior/Psychomotor:  no abnormal movements and no psychomotor agitation   Speech/Language:   normal rate, volume, tone, fluency and language intact, well formed   Mood:  ???Im fine???   Affect:  decreased range, depressed, guarded and mood congruent   Thought process:  logical, linear, clear, coherent, goal directed   Thought content:    denies thoughts of self-harm. Denies SI, plans, or intent. Denies HI.  No grandiose, self-referential, persecutory, or paranoid delusions noted.   Perceptual disturbances:   behavior not concerning for response to internal stimuli   Attention:  able to fully attend without fluctuations in consciousness   Concentration:  Able to fully concentrate and attend.  Able to count backwards from 42-27 without error   Orientation:  grossly oriented   Memory:  not formally tested, but grossly intact   Fund of knowledge:   consistent with level of education and development. 9 quarters in $2.25.    Insight:    Fair   Judgment:   Limited   Impulse Control:  Limited     Data Reviewed:  I reviewed labs from the last 24 hours.

## 2017-02-09 NOTE — Unmapped (Signed)
Aminoglycoside Follow-Up Pharmacy Note    Walter [name] is a 36 y.o. male on tobramycin 600 mg IV every 24 hours for CF exacerbation.  Currently on day 7 of therapy.    High-dose CF extended infusion (1 hour infusion):  Goal peak: 20-30 mg/L  Goal trough: < 0.6 mg/L (6-8 hr drug free interval)    Measured peak = 18.9 mg/L  Calculated peak = 27.4 mg/L    Measured trough/random = 2.1 mg/L  Calculated trough = 0.0 mg/L    Patient-Specific Pharmacokinetic Parameters:  Vd = 28.7 L, ke = 0.37 hr-1    ?? Ref. Range 02/01/2017 08:42 02/02/2017 05:34 02/03/2017 05:35 8/23 8/24 8/25 8/26 8/27   Bun Latest Ref Range: 7 - 21 mg/dL 12 14 15 12 11 14 17 13    Creatinine Latest Ref Range: 0.70 - 1.30 mg/dL 5.28 4.13 2.44 0.10 2.72 0.60 0.59 0.56   ??  Based on levels, recommend continue at 600mg  every 24hours.    Pharmacy will continue to monitor for changes in renal function, toxicity, and efficacy and order additional levels as needed. Please page service pharmacist with questions/clarifications.    Elesa Hacker, PharmD  PGY-1 Pharmacy Resident

## 2017-02-09 NOTE — Unmapped (Signed)
Problem: Patient Care Overview  Goal: Plan of Care Review  Outcome: Progressing  Received AM meds late due to VIR procedure this AM.  Afterwards, patient slept and is eating first meal of the day now.  Activity encouraged and reminded patient to walk in the halls compliantly.  Denies pain.  After receiving his PICC, I advised patient to always ask staff to [facility] him from his IV fluids due to increased infection risk as patient has been turning off his pump, silencing beeping, and disconnecting IV tubing and allowing it to hang uncapped at times.  Patient verbalized understanding and this will continue to be reinforced.  Continue POC.   02/08/17 0420   OTHER   Plan of Care Reviewed With patient   Plan of Care Review   Progress improving     Goal: Discharge Needs Assessment  Outcome: Progressing   02/02/17 1637 02/02/17 1800 02/03/17 1600   OTHER   Equipment Currently Used at Home other (see comments)  (Vest and Nebulizer) --  --    Patient and/or family were provided with choice of facilities / services that are available and appropriate to meet post hospital care needs? Other (Comment)  (Case Manager will provide choices as appropriate) --  --    Discharge Needs Assessment   Concerns to be Addressed --  --  --    Readmission Within the Last 30 Days no previous admission in last 30 days --  --    Patient/Family Anticipates Transition to --  home --    Anticipated Changes Related to Illness --  inability to work  (employed, but not able to work 2/2 being hospitalized) --    Equipment Needed After Discharge none --  --    Current Discharge Risk --  --  chronically ill;substance use/abuse    02/04/17 1725   OTHER   Equipment Currently Used at Home --    Patient and/or family were provided with choice of facilities / services that are available and appropriate to meet post hospital care needs? --    Discharge Needs Assessment   Concerns to be Addressed denies needs/concerns at this time   Readmission Within the Last 30 Days --    Patient/Family Anticipates Transition to --    Anticipated Changes Related to Illness --    Equipment Needed After Discharge --    Current Discharge Risk --        Problem: Cystic Fibrosis (Adult)  Goal: Signs and Symptoms of Listed Potential Problems Will be Absent, Minimized or Managed (Cystic Fibrosis)  Signs and symptoms of listed potential problems will be absent, minimized or managed by discharge/transition of care (reference Cystic Fibrosis (Adult) CPG).   Outcome: Progressing      Problem: Pain, Acute (Adult)  Goal: Identify Related Risk Factors and Signs and Symptoms  Related risk factors and signs and symptoms are identified upon initiation of Human Response Clinical Practice Guideline (CPG).   Outcome: Progressing   02/03/17 1600   Pain, Acute (Adult)   Related Risk Factors (Acute Pain) disease process   Signs and Symptoms (Acute Pain) verbalization of pain descriptors       Problem: VTE, DVT and PE (Adult)  Goal: Signs and Symptoms of Listed Potential Problems Will be Absent, Minimized or Managed (VTE, DVT and PE)  Signs and symptoms of listed potential problems will be absent, minimized or managed by discharge/transition of care (reference VTE, DVT and PE (Adult) CPG).   Outcome: Progressing

## 2017-02-09 NOTE — Unmapped (Signed)
[name] INTERVENTIONAL RADIOLOGY - Operative Note     VIR Post-Procedure Note    Procedure Name: PICC line placement    Pre-Op Diagnosis: cystic fibrosis    Post-Op Diagnosis: Same as pre-operative diagnosis    VIR Providers    Attending: Dr. Claretta Fraise  Resident/Fellow/APP: Alease Frame, FNP-C    Description of procedure: Successful placement of left brachial vein single lumen PICC line (47cm in length).    Estimated Blood Loss: approximately 1 mL  Complications: None    See detailed procedure note with images in PACS [facility]).    The patient tolerated the procedure well without incident or complication and left the room in stable condition.    Alease Frame, FNP-C  02/09/2017 9:06 AM

## 2017-02-09 NOTE — Unmapped (Addendum)
Cystic Fibrosis Nutrition Assessment    Inpatient: MD Consult this admission and related follow up  Primary Pulmonologist: Dr. Lurena Nida  ===================================================================  CLINICAL DATA:  Walter [name] is a 36 y.o. male seen for medical nutrition therapy. Currently admitted with pulmonary exacerbation vs. Pneumonia.  Walter [name] reports good appetite/intake. Declines snacks/PO supplements. Denies s/s of malabsorption.     Past Medical History:   Diagnosis Date   ??? Alcohol abuse    ??? Bipolar disorder (CMS-HCC)    ??? Chronic pancreatitis (CMS-HCC)    ??? Chronic sinusitis    ??? 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 Cystic fibrosis (CMS-HCC)    ??? Diabetes mellitus (CMS-HCC)     dx in-house 2015   ??? Kidney stones    ??? Portal hypertension (CMS-HCC)      Anthroprometric Evaluation:  Current BMI: Body mass index is 23.07 kg/m??.     Weight changes: Weights documented this admit are variable.  Last 5 Recorded Weights    02/01/17 0835 02/01/17 1351 02/01/17 1848 02/02/17 0600   Weight: 68 kg (150 lb) 72.6 kg (160 lb) 74.2 kg (163 lb 9.6 oz) 79.3 kg (174 lb 13.2 oz)     Wt Readings from Last 12 Encounters:   12/01/16 72.6 kg (160 lb)   10/09/16 76.2 kg (167 lb 15.9 oz)   08/04/16 77.4 kg (170 lb 10.2 oz)   07/24/16 70.3 kg (155 lb)   06/19/16 80.4 kg (177 lb 4.8 oz)   05/18/16 79.4 kg (175 lb)   04/10/16 79 kg (174 lb 2.6 oz)   03/19/16 69.3 kg (152 lb 11.2 oz)   11/25/15 79.2 kg (174 lb 11.2 oz)   09/26/15 79.9 kg (176 lb 1.6 oz)   08/15/15 77.3 kg (170 lb 6.7 oz)   04/16/14 80.8 kg (178 lb 3.2 oz)     Ht Readings from Last 3 Encounters:   02/01/17 185.4 cm (6' 1)   12/01/16 182.9 cm (6')   10/02/16 182.9 cm (6')     Malnutrition Assessment using AND/ASPEN Clinical Characteristics:  Pt declined NFPE, just back from PICC line placement. Note actual weight documented this admit is appropriate & he reports good intake/appetite. ===================================================================  Cystic Fibrosis Nutrition Category = Acceptable   ===================================================================  Current Nutrition Orders (inpatient):  Nutrition Orders          NPO Strict; Procedure/Test starting at 08/28 0001        CF Nutrition related medications (inpatient): Nutritionally relevant medications reviewed.    Calcium carbonate  IV antibiotics  50,000 International units vitamin D2 daily x 2 weeks, then weekly x 8 weeks  Folic acid  Lantus 36 units daily  Lispro 12 units four times daily ACHS  Zenpep 20,000 (12 at meals, 4 at snacks) provides 3000 units lipase/kg/meal & 16109 units lipase/kg/day  Magnesium oxide  Complete formulation 2 daily  protonix  5 mg phytonadione twice weekly  Polyethylene glycol   Thiamine daily  Ursodiol  Ondansetron PRN    CF Nutrition related labs (inpatient):    02-09-17: Glu 310, Glu-POC 264, PT WNL  02-08-17: Glu-POC [phone]  02-02-17: Glu 272, Glu-POC 240-278  02-01-17: Glu 246, Glu-POC [phone], Hgb A1C 10.5% - elevatd  ==================================================================  Energy Intake (outpatient):  Diet: High in calories, fat, & salt. Diet evaluated this visit. Reports good appetite.  Food allergies: None known  Diet and CFTR modulators: Not applicable.  PO Supplements: [employer]. Has received PO supplements from [employer]  in the past.  Appetite Stimulant: Per primary pulmonologist note 07-24-16 favor Remeron as was the plan from his last psychiatric admission. Hx of cyproheptadine.   Enteral feeding tube: none  Sodium in diet: Adequate from diet when PO intake is at baseline  Calcium in diet:  Adequate from diet when PO intake is at basline    Fat Malabsorption (outpatient):  Enzyme brand, (meals/snacks): Zenpep 40,000 @ 6/meal and 3/snack  Enzyme administration details: compliance unknown at this time   - Reports he has not tolerated Creon in the past due to oily, loose stools.  Enzyme dose per MEAL (units lipase/kg/meal) 3000  Enzyme dose per DAY (units lipase/kg/day) 13619  Stools: denies s/s of malabsorption   Abdominal pain: None reported  Fecal Fat Studies: no new  No results found for: ELAST  GI meds: Nutritionally relevant medications reviewed.     Vitamins/Minerals (outpatient):  CF-specific MVI, dose, compliance: MVW Complete Formulation Softgel 2 daily, from [facility], compliance unknown  Other vitamins/mienerals/herbals: none  Calcium supplement: yes  Patient Resources: [facility]  Fat-soluble vitamin levels:   Lab Results   Component Value Date/Time    VITAMINA 22.4 (L) 03/19/2016 1529   - Low: However this level is likely falsely low as it was drawn at sick clinic visit, admitted ~ 1 week later for exacerbation/IV antibiotics.    Lab Results   Component Value Date/Time    VITDTOTAL 8.8 (L) 02/02/2017 0534    VITDTOTAL 20 03/19/2016 1529   - low    Lab Results   Component Value Date/Time    VITD3 20 03/19/2016 1529     Lab Results   Component Value Date/Time    VITD2 <5 03/19/2016 1529     Lab Results   Component Value Date/Time    VITAME 4.5 (L) 03/19/2016 1529   - Low: However this level is likely falsely low as it was drawn at sick clinic visit, admitted ~1 week later for exacerbation/IV antibiotics.    Lab Results   Component Value Date/Time    PT 10.9 02/09/2017 0556    PT 11.1 10/31/2010 1115     Bone Health: Due for DEXA. Last DEXA 2011 showed normal bone density.      CF Related Diabetes: Yes. Follows with [facility] Endocrinology.  Lab Results   Component Value Date/Time    A1C 10.5 (H) 02/01/2017 0842    A1C 8.8 (H) 11/25/2015 1426    A1C 9.3 (H) 04/16/2014 1604   ===================================================================  Assessment:  Current diet is appropriate for CF. Patient continues to work towards goals for weight management.   Enzyme dose is within established guidelines. Vitamin prescription is appropriate to reach/maintain optimal fat soluble vitamin levels. Patient may benefit from vitamin K supplementation while on IV antibiotics. Agree with acid reducer and bowel regimen. Patient to benefit from insulin regimen for glucose management.  ===================================================================  Goals:  1. Meet estimated daily needs: 3386 kcals while inpatient considering lower activity factor, recommend increase to 4230 kcals when outpatient considering higher activity factor; 95-126 gm pro; 2686 mL free water       Calories estimated using: Cystic Fibrosis Conference Formula, fluid per Holiday Segar, protein per DRI x 1.5-2  2. Reach/maintain established goals for CF:                Adult - BMI 22kg/m2 for CF females and 23kg/m2 for CF males  3. Normal fat-soluble vitamin levels: Vitamin A, Vitamin E and PT per lab range; Vitamin D 25OH total >  30  4. Maintain glucose control. Carbohydrate content of diet should comprise 40-50% of total calorie needs, but carbohydrates are not restricted in this population.    5.  Meet sodium needs for CF  ===================================================================  Intervention:  1. Encouraged intake of all meals & snacks. Will reoffer shakes & snacks at future visits.  - Encouraged him to notify nursing staff when eating for glucose management.    2. Home enzyme Zenpep 40,000 not on [facility] inpatient formulary. While inpatient continue Zenpep 20,000.    3. Continue CF  vitamin regimen: 2 Complete Formulation capsules daily.  - Agree with vitamin D supplementation given level remains low.    4. PT WNL. Continue 5mg  phytonadione twice weekly while on IV antibiotics.    5. Please obtain weights twice weekly this admit.    6. Continue remainder of nutrition regimen:  - High Calorie High Protein diet  - acid reducer  - bowel regimen  - insulin regimen  - thiamin & folic acid  ===================================================================  Inpatient:   Will follow up with patient per protocol: 1-2 times per week (and more frequent as indicated)

## 2017-02-09 NOTE — Unmapped (Signed)
SW REFERRAL SOURCE/REASON: Walter [name] is a 36 y.o. male currently admitted with CF exacerbation for IV antibiotics.    SOCIAL HISTORY: Pt lives in [place], [place] with several roommates. He met his roommates while living at an [facility] house for sober living. Pt's mom and sister are living and supportive- his father is recently deceased. Pt and his mom have hx of discord in their relationship.      INSURANCE: Private. Pt is interested in filing for disability/Medicaid and wanted assistance with this- SW referred pt to [facility] with the [facility] for consultation with their legal team.       MENTAL HEALTH/COPING: Focus of conversation as pt endorses recent binge drinking following the death of his dad and immediately before admission. Pt and SW have discussed his hx in the past and had tried phone based counseling prior to deciding that pt needed more structure from therapy. Pt is declining at this time any referrals to structured living like [facility], etc. He does believe that outpatient counseling would be beneficial- he is not currently connected to a therapist and is willing to review bios from [email or web address].       PLAN: Pt and SW willl continue to discuss options for therapy in local area during this admission.        Serita Grit, LCSW

## 2017-02-09 NOTE — Unmapped (Signed)
Problem: Patient Care Overview  Goal: Plan of Care Review  Outcome: Progressing    Goal: Individualization and Mutuality  Outcome: Progressing    Goal: Discharge Needs Assessment  Outcome: Progressing    Goal: Interprofessional Rounds/Family Conf  Outcome: Progressing      Problem: Cystic Fibrosis (Adult)  Goal: Signs and Symptoms of Listed Potential Problems Will be Absent, Minimized or Managed (Cystic Fibrosis)  Signs and symptoms of listed potential problems will be absent, minimized or managed by discharge/transition of care (reference Cystic Fibrosis (Adult) CPG).   Outcome: Progressing      Problem: Pain, Acute (Adult)  Goal: Identify Related Risk Factors and Signs and Symptoms  Related risk factors and signs and symptoms are identified upon initiation of Human Response Clinical Practice Guideline (CPG).   Outcome: Progressing    Goal: Acceptable Pain Control/Comfort Level  Patient will demonstrate the desired outcomes by discharge/transition of care.   Outcome: Progressing      Problem: VTE, DVT and PE (Adult)  Goal: Signs and Symptoms of Listed Potential Problems Will be Absent, Minimized or Managed (VTE, DVT and PE)  Signs and symptoms of listed potential problems will be absent, minimized or managed by discharge/transition of care (reference VTE, DVT and PE (Adult) CPG).   Outcome: Progressing

## 2017-02-09 NOTE — Unmapped (Signed)
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    Cystic fibrosis with acute pulmonary exacerbation: (F508del L and I 506T), FEV1 35.3%, followed by Dr. Lurena Nida. Exacerbation characterized by chest pain, increased DOE. Prior cultures mPsA, sPsA, H. Flu.   - CF culture: 2+ mucoid Pseudomonas  - AFB smear neg  - PICC placed 8/28  - Ceftazidime and tobramycin (8/21- ) plan for 2 wk course (9/2)  - Bcx: NGTD  - Encouraged airway clearance despite minimal sputum production  - Airway clearance: chest vest - needs much encouragement  - Home pulmozyme  - Albuterol, 7% HTS   - Tylenol as needed for pain    Alcohol abuse: Last drink 8/19, no hx of complicated withdrawal, CIWA d/c  - Thiamine, folic acid, MVM    CF related diabetes mellitus: Uncontrolled, HbA1c 10.5%; uncontrolled BG during hospitalization given frequent untimed meals.   - Increased Lantus 36 units dialy  - Lispro 12 units with meals QID (often eats multiple meals)  - SSI      CF related pancreatic insufficiency:   - Zenpep 12 capsules w/ meals, 4 with snacks  - MVM w/ minerals     Vitamin D Def  - Vit D 50K daily while in house  - Recheck Vit D before discharge     Hepatic steatosis: complicated by alcohol use  - Urosodiol 900 mg BID (was not taking at home, no reasoning, able to afford, no s/e)    Tinea versicolor, improving:   - Fluconazole 300 mg x 1  - Ketoconazole cream  - Diflucan 300mg  x1    Substance-induced depressive disorder: Improved mood and interactions  - Psych following. Appreciate recs.   - Continue home Zyprexa  - Zoloft inreased to 200mg   - F/u for OP psych care He can contact his insurance carrier or local Managed Care Organization, [employer] [phone]).    Hep C Ab positive: Positive in the past, RNA pending    Subjective  No additional O2 required overnight. PICC placement this AM went well, no issues. Belly pain has improved. Says he has been using chest vest.     Objective:    Vital signs in last 24 hours:  Temp:  [36.8 ??C-37.1 ??C] 36.8 ??C  Heart Rate:  [63-83] 66  Resp:  [14-20] 16  BP: (111-140)/(71-94) 112/76  MAP (mmHg):  [84-109] 84  SpO2:  [90 %-96 %] 96 %    Physical Exam:  General: NAD, lying in bed  HEENT: MMM  Cardiovascular: Regular rate, normal rhythm. No murmurs, rubs or gallops.   Lung: Crackles in LLL, normal WOB on RA  Neuro: alert  Psych: normal mood and affect    [name], DO  PGY1 - Pager: [phone]  ______________________________________________________________________  ??  Teaching Attending Attestation   ??  I saw the patient with Dr. Riley Churches. We formulated a joint history as documented above. I examined the patient fully, evaluated the test results, and formulated the above assessment and plan as documented in the resident note. I agree with the plan for treatment and follow up as outlined.   ??  Active Inpatient Problem List includes:  1. Acute exacerbation of cystic fibrosis due to pseudomonas infection  2. Poor compliance with airway clearance  3. History of alcohol abuse, pre-contemplative stage of change  4. CF related diabetes, on insulin  5. Hypomagnesemia  6. Pancreatic insufficiency  ??  Plan for today includes: Continue IV ABX (ceftaz/tobra) through 9/4; needs entire course in house due to compliance history. Patient needs ongoing encouragement for use of airway clearance; states no device nor CPT has ever worked for him. S/p line placement today; diet resumed.   ??  I personally spent over half of a total 20 minutes in counseling and discussion with the patient and coordination of care as described above. Patient updated at bedside. Care coordinated with Dr. Lurena Nida.   ??  Harrel Carina, MD  Pulmonary/Critical Care Medicine

## 2017-02-10 NOTE — Unmapped (Signed)
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    Cystic fibrosis with acute pulmonary exacerbation: (F508del L and I 506T), FEV1 35.3%, followed by Dr. Lurena Nida. Exacerbation characterized by chest pain, increased DOE. Prior cultures mPsA, sPsA, H. Flu.   - CF culture: 2+ mucoid Pseudomonas  - AFB smear neg  - PICC placed 8/28  - Ceftazidime and tobramycin (8/21- ) plan for 2 wk course (9/2)  - Bcx: NGTD  - Encouraged airway clearance despite minimal sputum production  - Airway clearance: chest vest - needs much encouragement  - Home pulmozyme  - Albuterol, 7% HTS   - Tylenol as needed for pain  - PFTs 8/29: pending    Alcohol abuse: Last drink 8/19, no hx of complicated withdrawal, CIWA d/c  - Thiamine, folic acid, MVM    CF related diabetes mellitus: Uncontrolled, HbA1c 10.5%; uncontrolled BG during hospitalization given frequent untimed meals. Pt repeatedly refusing mealtime insulin.  - Increased Lantus 36 units dialy  - Lispro 12 units with meals QID (often eats multiple meals)  - SSI      CF related pancreatic insufficiency:   - Zenpep 12 capsules w/ meals, 4 with snacks  - MVM w/ minerals     Vitamin D Def  - Vit D 50K daily while in house  - Recheck Vit D before discharge Hepatic steatosis: complicated by alcohol use  - Urosodiol 900 mg BID (was not taking at home, no reasoning, able to afford, no s/e)    Tinea versicolor, improving:   - Fluconazole 300 mg x 1  - Ketoconazole cream  - Diflucan 300mg  x1    Substance-induced depressive disorder: Improved mood and interactions  - Psych following. Appreciate recs. Minimizing staff interruptions. Reinforcing necessity of nursing disconnecting IV line.   - Continue home Zyprexa  - Zoloft inreased to 200mg   - F/u for OP psych care He can contact his insurance carrier or [employer]er, industrial[employer]product, [employer] [phone]).    Hep C Ab positive: Positive in the past, RNA not detected    Subjective  No additional O2 required overnight. Breathing fine. Treatments going well. Expresses understanding of importance of insulin for good sugar control as well as chest vest for clearance.      Objective:    Vital signs in last 24 hours:  Temp:  [36.9 ??C-37.2 ??C] 36.9 ??C  Heart Rate:  [69-70] 69  Resp:  [18-20] 20  BP: (140-146)/(70-93) 140/93  MAP (mmHg):  [104] 104  SpO2:  [91 %-96 %] 91 %    Physical Exam:  General: NAD, lying in bed  Cardiovascular: Regular rate, normal rhythm. No murmurs, rubs or gallops.  Lung: Crackles in R mid and lower lobe, normal WOB on RA  Ext: PICC line on left c/d/i  Skin: fungal rash improving  Neuro: alert  Psych: normal mood and affect    Lisa Blakeman T. Riley Churches, DO  PGY1 - Pager: [phone]

## 2017-02-10 NOTE — Unmapped (Signed)
Problem: Patient Care Overview  Goal: Plan of Care Review  Outcome: Progressing    Goal: Individualization and Mutuality  Outcome: Progressing    Goal: Discharge Needs Assessment  Outcome: Progressing    Goal: Interprofessional Rounds/Family Conf  Outcome: Progressing      Problem: Cystic Fibrosis (Adult)  Goal: Signs and Symptoms of Listed Potential Problems Will be Absent, Minimized or Managed (Cystic Fibrosis)  Signs and symptoms of listed potential problems will be absent, minimized or managed by discharge/transition of care (reference Cystic Fibrosis (Adult) CPG).   Outcome: Progressing      Problem: Pain, Acute (Adult)  Goal: Identify Related Risk Factors and Signs and Symptoms  Related risk factors and signs and symptoms are identified upon initiation of Human Response Clinical Practice Guideline (CPG).   Outcome: Progressing    Goal: Acceptable Pain Control/Comfort Level  Patient will demonstrate the desired outcomes by discharge/transition of care.   Outcome: Progressing      Problem: VTE, DVT and PE (Adult)  Goal: Signs and Symptoms of Listed Potential Problems Will be Absent, Minimized or Managed (VTE, DVT and PE)  Signs and symptoms of listed potential problems will be absent, minimized or managed by discharge/transition of care (reference VTE, DVT and PE (Adult) CPG).   Outcome: Progressing

## 2017-02-10 NOTE — Unmapped (Signed)
Problem: Cystic Fibrosis (Adult)  Goal: Signs and Symptoms of Listed Potential Problems Will be Absent, Minimized or Managed (Cystic Fibrosis)  Signs and symptoms of listed potential problems will be absent, minimized or managed by discharge/transition of care (reference Cystic Fibrosis (Adult) CPG).   Outcome: Progressing  Patient did well with doing all of their treatments today. Patient did their airway clearance without complications.

## 2017-02-10 NOTE — Unmapped (Signed)
Problem: Cystic Fibrosis (Adult)  Goal: Signs and Symptoms of Listed Potential Problems Will be Absent, Minimized or Managed (Cystic Fibrosis)  Signs and symptoms of listed potential problems will be absent, minimized or managed by discharge/transition of care (reference Cystic Fibrosis (Adult) CPG).   Outcome: Progressing  Patient did well with doing all of their treatments today. Patient refused his airway clearance. He was a little grumpy this morning and short. Will continue to monitor.

## 2017-02-11 DIAGNOSIS — J189 Pneumonia, unspecified organism: Principal | ICD-10-CM

## 2017-02-11 LAB — BASIC METABOLIC PANEL
ANION GAP: 11 mmol/L (ref 9–15)
BLOOD UREA NITROGEN: 19 mg/dL (ref 7–21)
BUN / CREAT RATIO: 30
CALCIUM: 9.1 mg/dL (ref 8.5–10.2)
CHLORIDE: 95 mmol/L — ABNORMAL LOW (ref 98–107)
CO2: 30 mmol/L (ref 22.0–30.0)
CREATININE: 0.64 mg/dL — ABNORMAL LOW (ref 0.70–1.30)
GLUCOSE RANDOM: 440 mg/dL (ref 65–179)
SODIUM: 136 mmol/L (ref 135–145)

## 2017-02-11 LAB — CBC
HEMATOCRIT: 41.4 % (ref 41.0–53.0)
HEMOGLOBIN: 13.6 g/dL (ref 13.5–17.5)
MEAN CORPUSCULAR HEMOGLOBIN CONC: 32.9 g/dL (ref 31.0–37.0)
MEAN CORPUSCULAR HEMOGLOBIN: 30.7 pg (ref 26.0–34.0)
MEAN CORPUSCULAR VOLUME: 93.2 fL (ref 80.0–100.0)
MEAN PLATELET VOLUME: 7.2 fL (ref 7.0–10.0)
PLATELET COUNT: 210 10*9/L (ref 150–440)
RED CELL DISTRIBUTION WIDTH: 14.4 % (ref 12.0–15.0)

## 2017-02-11 LAB — MAGNESIUM: Magnesium:MCnc:Pt:Ser/Plas:Qn:: 1.5 — ABNORMAL LOW

## 2017-02-11 LAB — HEMOGLOBIN: Lab: 13.6

## 2017-02-11 LAB — BUN / CREAT RATIO: Urea nitrogen/Creatinine:MRto:Pt:Ser/Plas:Qn:: 30

## 2017-02-11 NOTE — Unmapped (Signed)
Problem: Patient Care Overview  Goal: Plan of Care Review  Outcome: Progressing  Patient tolerated treatments and medications well with nae.  Patient did not call out when tray arrived to room for [place] to check his blood sugar.  Happened to see tray delivery and gave insulin.  Found second tray at bedside later in shift.  And tried to reinforce to patient that he needed to let us know when they arrived so we could check and cover. Patient may need more diabetes education including consequences of high blood glucose levels and the damage they can do to the body.  Other than this patient tolerated other treatments and medications well with nae.  Vs remain wnl. Will continue to monitor.    Goal: Individualization and Mutuality  Outcome: Progressing    Goal: Discharge Needs Assessment  Outcome: Progressing   02/02/17 1637 02/02/17 1800 02/03/17 1600   OTHER   Equipment Currently Used at Home other (see comments)  (Vest and Nebulizer) --  --    Patient and/or family were provided with choice of facilities / services that are available and appropriate to meet post hospital care needs? Other (Comment)  (Case Manager will provide choices as appropriate) --  --    Discharge Needs Assessment   Concerns to be Addressed --  --  --    Readmission Within the Last 30 Days no previous admission in last 30 days --  --    Patient/Family Anticipates Transition to --  home --    Anticipated Changes Related to Illness --  inability to work  (employed, but not able to work 2/2 being hospitalized) --    Equipment Needed After Discharge none --  --    Current Discharge Risk --  --  chronically ill;substance use/abuse    02/04/17 1725   OTHER   Equipment Currently Used at Home --    Patient and/or family were provided with choice of facilities / services that are available and appropriate to meet post hospital care needs? --    Discharge Needs Assessment   Concerns to be Addressed denies needs/concerns at this time   Readmission Within the Last 30 Days --    Patient/Family Anticipates Transition to --    Anticipated Changes Related to Illness --    Equipment Needed After Discharge --    Current Discharge Risk --      Goal: Interprofessional Rounds/Family Conf  Outcome: Progressing   02/11/17 0317   Interdisciplinary Rounds/Family Conf   Participants nursing;patient       Problem: Cystic Fibrosis (Adult)  Intervention: Monitor Stool Output/Characteristics   02/03/17 1600   Interventions   Medication Review/Management medications reviewed     Intervention: Maximize Oxygenation/Ventilation/Perfusion   02/10/17 2100   Interventions   Head of Bed (HOB) HOB elevated     Intervention: Manage Suspected/Actual Infection   02/11/17 0000   Interventions   Isolation Precautions contact precautions maintained       Goal: Signs and Symptoms of Listed Potential Problems Will be Absent, Minimized or Managed (Cystic Fibrosis)  Signs and symptoms of listed potential problems will be absent, minimized or managed by discharge/transition of care (reference Cystic Fibrosis (Adult) CPG).   Outcome: Progressing   02/09/17 1816   Cystic Fibrosis (Adult)   Problems Assessed (Cystic Fibrosis) pulmonary/respiratory complications   Problems Present (Cystic Fibrosis) pulmonary/respiratory complications

## 2017-02-11 NOTE — Unmapped (Signed)
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    Cystic fibrosis with acute pulmonary exacerbation: (F508del L and I 506T), FEV1 35.3%, followed by Dr. Lurena Nida. Exacerbation characterized by chest pain, increased DOE. Prior cultures mPsA, sPsA, H. Flu.   - CF culture: 2+ mucoid Pseudomonas  - AFB smear neg  - PICC placed 8/28  - Ceftazidime and tobramycin (8/21- ) plan for 2 wk course (9/2)  - Bcx: NGTD  - Encouraged airway clearance despite minimal sputum production  - Airway clearance: chest vest - needs much encouragement  - Home pulmozyme  - Albuterol, 7% HTS   - Tylenol as needed for pain  - PFTs 8/30: pending    Alcohol abuse: Last drink 8/19, no hx of complicated withdrawal, CIWA d/c  - Thiamine, folic acid, MVM    CF related diabetes mellitus: Uncontrolled, HbA1c 10.5%; uncontrolled BG during hospitalization given frequent untimed meals. Pt repeatedly refusing mealtime insulin as his mealtimes are scattered throughout the day.  - Increased Lantus 36 units dialy  - Lispro 12 units with meals QID (often eats multiple meals)  - SSI   - Meal trays to nurses     CF related pancreatic insufficiency:   - Zenpep 12 capsules w/ meals, 4 with snacks  - MVM w/ minerals     Vitamin D Def  - Vit D 50K daily while in house  - Recheck Vit D before discharge     Hepatic steatosis: complicated by alcohol use  - Urosodiol 900 mg BID (was not taking at home, no reasoning, able to afford, no s/e)    Tinea versicolor, improving:   - Fluconazole 300 mg x 1  - Ketoconazole cream  - Diflucan 300mg  x1    Substance-induced depressive disorder: Improved mood and interactions  - Psych following. Appreciate recs. Minimizing staff interruptions. Reinforcing necessity of nursing disconnecting IV line.   - Continue home Zyprexa  - Zoloft inreased to 200mg   - F/u for OP psych care He can contact his insurance carrier or Designer, industrial/product, [employer] [phone]).    Hep C Ab positive: Positive in the past, RNA not detected    Subjective  No additional O2 required overnight. No complaints    Objective:    Vital signs in last 24 hours:  Temp:  [36.9 ??C-37.2 ??C] 37.2 ??C  Heart Rate:  [64-68] 68  Resp:  [18] 18  BP: (140-144)/(75-93) 140/75  SpO2:  [95 %-97 %] 95 %    Physical Exam:  General: NAD, lying in bed  Cardiovascular: Regular rate, normal rhythm. No murmurs, rubs or gallops.   Lung: Mild crackles in R mid and lower lobe,  normal WOB on RA  Ext: PICC line on left c/d/i  Skin: fungal rash improving  Neuro: alert  Psych: normal mood and affect    [name], DO  PGY1 - Pager: [phone]    ______________________________________________________________________  ??  Teaching Attending Attestation   ??  I saw the patient with Dr. Riley Churches. We formulated a joint history as documented above. I examined the patient fully, evaluated the test results, and formulated the above assessment and plan as documented in the resident note. I agree with the plan for treatment and follow up as outlined.   ??  Active Inpatient Problem List includes:  1. Acute exacerbation of cystic fibrosis due to pseudomonas infection  2. Poor compliance with airway clearance  3. History of alcohol abuse, pre-contemplative stage of change  4. CF related diabetes, on insulin  5. Hypomagnesemia  6. Pancreatic insufficiency  ??  Plan for today includes: Continue IV ABX (ceftaz/tobra) through 9/4; needs entire course in house due to compliance history. Patient needs ongoing encouragement for use of airway clearance; states no device nor CPT has ever worked for him. BS poorly controlled; patient has not been calling for insulin when his tray arrives. Asked dietary to deliver trays to nurses station instead so that his insulin can be administered promptly. Endo has not seen him in house - will ask them to stop by to reinforce need for insulin.   ??  I personally spent over half of a total 25??minutes in counseling and discussion with the patient and coordination of care as described above. Patient updated at bedside.   ??  Harrel Carina, MD  Pulmonary/Critical Care Medicine

## 2017-02-12 LAB — TOBRAMYCIN RANDOM
Tobramycin:MCnc:Pt:Ser/Plas:Qn:: 14.3
Tobramycin:MCnc:Pt:Ser/Plas:Qn:: 2.7

## 2017-02-12 MED ORDER — SERTRALINE 100 MG TABLET
ORAL_TABLET | Freq: Every day | ORAL | 1 refills | 0 days | Status: SS
Start: 2017-02-12 — End: 2017-06-06

## 2017-02-12 MED ORDER — GABAPENTIN 400 MG CAPSULE
ORAL_CAPSULE | Freq: Three times a day (TID) | ORAL | 1 refills | 0.00000 days | Status: CP
Start: 2017-02-12 — End: 2017-06-01

## 2017-02-12 MED ORDER — KETOCONAZOLE 2 % SHAMPOO
Freq: Every day | TOPICAL | 0 refills | 0.00000 days | Status: CP | PRN
Start: 2017-02-12 — End: 2017-03-14

## 2017-02-12 MED ORDER — URSODIOL 300 MG CAPSULE
ORAL_CAPSULE | Freq: Two times a day (BID) | ORAL | 1 refills | 0.00000 days | Status: CP
Start: 2017-02-12 — End: 2017-03-14

## 2017-02-12 MED ORDER — ALBUTEROL SULFATE 2.5 MG/3 ML (0.083 %) SOLUTION FOR NEBULIZATION
Freq: Four times a day (QID) | RESPIRATORY_TRACT | 11 refills | 0 days | Status: CP
Start: 2017-02-12 — End: 2018-04-06

## 2017-02-12 MED ORDER — OLANZAPINE 15 MG TABLET
ORAL_TABLET | Freq: Every evening | ORAL | 1 refills | 0 days | Status: SS
Start: 2017-02-12 — End: 2017-06-22

## 2017-02-12 NOTE — Unmapped (Signed)
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    Cystic fibrosis with acute pulmonary exacerbation: (F508del L and I 506T), FEV1 35.3%, followed by Dr. Lurena Nida. FEV1 26% on 8/30 after 1 attempt.   - CF culture: 2+ mucoid Pseudomonas  - AFB smear neg  - Ceftazidime and tobramycin (8/21- ) plan for 2 wk course (9/2)  - Bcx: NGTD  - Airway clearance: chest vest - needs continued encouragement  - Home pulmozyme, Albuterol, 7% HTS   - Tylenol as needed for pain    Alcohol abuse: Last drink 8/19, no hx of complicated withdrawal, CIWA d/c  - Thiamine, folic acid, MVM    CF related diabetes mellitus: Uncontrolled, HbA1c 10.5%; uncontrolled BG during hospitalization given frequent untimed meals. Follows with Dr. Lyda Perone as an outpatient.   - Endocrine consulted, appreciate recommendations   - Increased Lantus 36 --> 25 units daily  - Lispro 10-->16 units ACHS (cover 4 meals a day)  - SSI   - Meal trays to nurses     CF related pancreatic insufficiency:   - Zenpep 12 capsules w/ meals, 4 with snacks  - MVM w/ minerals     Vitamin D Def  - Vit D 50K daily while in house  - Recheck Vit D  9/1     Hepatic steatosis: complicated by alcohol use  - Urosodiol 900 mg BID (was not taking at home, no reasoning, able to afford, no s/e)    Tinea versicolor, improving:   - Ketoconazole cream  - Diflucan 300mg  x1    Substance-induced depressive disorder: Improved mood and interactions  - Psych following. Appreciate recs. Minimizing staff interruptions. Reinforcing necessity of nursing disconnecting IV line.   - Continue home Zyprexa  - Zoloft inreased to 200mg   - F/u for OP psych care He can contact his insurance carrier or Designer, industrial/product, [employer] [phone]).    Hep C Ab positive: Positive in the past, RNA not detected    Subjective  PFTs yesterday with FEV1 of 26%, he only did one maneuver because he hates doing them and per him repeat attempts to not change his FEV1. He was surprised his FEV1 was as high as 26. He does not wish to stay and repeat PFTs.    Objective:    Vital signs in last 24 hours:  Temp:  [36.7 ??C-37.1 ??C] 36.7 ??C  Heart Rate:  [76-97] 85  Resp:  [18] 18  BP: (129-170)/(76-98) 129/76  MAP (mmHg):  [89-111] 89  SpO2:  [95 %] 95 %    Physical Exam:  General: NAD, lying in bed  Cardiovascular: Regular rate, normal rhythm. No murmurs, rubs or  gallops.   Lung: Mild crackles in R mid and lower lobe, normal WOB on RA  Ext: PICC line on left c/d/i  Skin: Faint tinea versicolor rash on upper back and chest, much improved.  Neuro: alert  Psych: Improve affect but still with minimal eye contact.    ______________________________________________________________________  ??  Teaching Attending Attestation   ??  I saw the patient with Dr. Bruna Potter. We formulated a joint history as documented above. I examined the patient fully, evaluated the test results, and formulated the above assessment and plan as documented in the resident note. I agree with the plan for treatment and follow up as outlined.   ??  Active Inpatient Problem List includes:  1. Acute exacerbation of cystic fibrosis due to pseudomonas infection  2. Poor compliance with airway clearance  3. History of alcohol abuse, pre-contemplative stage of change  4. CF related diabetes, on insulin, non compliant with dosing  5. Hypomagnesemia  6. Pancreatic insufficiency  ??  Plan for today includes: Continue IV ABX (ceftaz/tobra) through 9/4; needs entire course in house due to compliance history. Patient needs ongoing encouragement for use of airway clearance; states no device nor CPT has ever worked for him. Did only 1 pft maneuver yesterday and says he is not willing to do more than that nor repeat them. BS remains poorly controlled; still not calling out for insulin with snacks. Patient unwilling to engage in discussions about how this is hindering his care.  ??  I personally spent over half of a total 25??minutes in counseling and discussion with the patient and coordination of care as described above. Patient updated at bedside.   ??  Harrel Carina, MD  Pulmonary/Critical Care Medicine

## 2017-02-12 NOTE — Unmapped (Signed)
Endocrinology Consult Note   Requesting Attending Physician : Walter [name], *  Service Requesting Consult : Pulmonology (MDG)  Primary Care Provider: Dellis Filbert, MD  Outpatient Endocrinologist: Walter Denmark, MD (last outpatient visit 11/2015)    IMPRESSION:  Walter [name] is a 36 y.o. male admitted for Bronchiectasis with acute exacerbation (CMS-HCC). I have been asked to evaluate Walter [name] ([name]) for hyperglycmia. We are consulted for uncontrolled diabetes.     RECOMMENDATIONS:  1. CF Related Diabetes, uncontrolled with recent A1C 10.5%:   -discussed regimen in depth.  Team has appropriately titrated insulin and encouraged pt to notify RN when eating so insulin can be administered. Despite their efforts he continues to forget to notify RN.  Discussed concerns at home in regards to feeling overwhelmed and giving up on managing DM and CF. He is aware that he could take a simpler regimen, though not ideal as it would still be better than him not taking insulin at all.    Recs:  -reduced lantus from 36 to 25 units qAM. Dose had been titrated up to cover po however pt does tell me he will notify RN from here on out.   -increased lispro from 10 to 16 units achs (to cover 4 meals a day)  -continue lispro standard SS achs      2. Nutrition:  Variable po intake complicating overall glycemic control.     Thank you for this kind consult.  Discussed with team and RN at bedside. Orders revised.  We will continue to follow and make recommendations.      HPI:  Walter [name]??is a 36 y.o.??male??with h/o CF c/b CFRD, alcohol abuse, bipolar disorder admitted for Cystic fibrosis exacerbation (RAF-HCC). I have been asked to evaluate Walter [name]??for CFRD management. At home he is on Lantus 20u qAm and Novolog 8-16u premeal (does not carb count, estimates based on what he's eating and the size of the meal; add correction but again estimates). He sees Walter [name] outpatient, last seen in June 2017. Last clinic note documents regimen of Lantus 14u and Novolog 4-8u plus SSI 2:50>200. A1C 8.8% in Feb, 8.2% in June and now 10.5%. No hypoglycemia at home. No known complications of diabetes.    Pt states he has been drinking a lot again and sort of gave up before this admission. States he wasn't taking care of his lungs nor his diabetes.     Current inpatient diabetes regimen:  lantus 36 units qAM  Lispro 10 units qid for meals  Lispro standard SS achs    ROS: Per HPI, otherwise remaining of 10 systems negative.    ??? albuterol  2.5 mg Nebulization 4x Daily (RT)   ??? calcium carbonate  600 mg of elem calcium Oral Daily   ??? cefTAZidime  2 g Intravenous Q8H   ??? dornase alfa  2.5 mg Inhalation Daily (RT)   ??? ergocalciferol  50,000 Units Oral Daily   ??? folic acid  1 mg Oral Daily   ??? gabapentin  400 mg Oral TID   ??? heparin, porcine (PF)  200 Units Intravenous Q24H [facility]   ??? insulin glargine  36 Units Subcutaneous Daily   ??? insulin lispro  0-12 Units Subcutaneous ACHS   ??? insulin lispro  10 Units Subcutaneous ACHS   ??? ketoconazole  1 application Topical Daily   ??? lipase-protease-amylase  12 capsule Oral 3xd Meals   ??? MVW Complete (pediatric multivit 61-D3-vit K)  2 capsule Oral Daily   ??? OLANZapine  15 mg Oral Nightly   ??? pantoprazole  40 mg Oral Daily   ??? phytonadione (vitamin K1)  5 mg Oral Tue,Sat   ??? polyethylene glycol  17 g Oral Daily   ??? sertraline  200 mg Oral Daily   ??? sodium chloride 7%  4 mL Nebulization 4x Daily (RT)   ??? thiamine  100 mg Oral Daily   ??? tobramycin (NEBCIN) IVPB (conventional dosing)  600 mg Intravenous Q24H   ??? ursodiol  900 mg Oral BID       Past Medical History:   Diagnosis Date   ??? Alcohol abuse    ??? Bipolar disorder (CMS-HCC)    ??? Chronic pancreatitis (CMS-HCC)    ??? Chronic sinusitis    ??? 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 Cystic fibrosis (CMS-HCC)    ??? Diabetes mellitus (CMS-HCC)     dx in-house 2015   ??? Kidney stones    ??? Portal hypertension (CMS-HCC)        Family History   Problem Relation Age of Onset   ??? Diabetes Maternal Grandmother    ??? Diabetes Maternal Grandfather    ??? No Known Problems Mother    ??? Cancer Father    ??? No Known Problems Sister        Social History   Substance Use Topics   ??? Smoking status: Former Smoker     Types: Cigarettes   ??? Smokeless tobacco: Never Used   ??? Alcohol use 9.6 oz/week     16 Cans of beer per week       PHYSICAL EXAMINATION:  BP 129/76  - Pulse 85  - Temp 36.7 ??C (98.1 ??F) (Oral)  - Resp 18  - Ht 185.4 cm (6' 1)  - Wt 75.7 kg (166 lb 12.8 oz)  - SpO2 95%  - BMI 22.01 kg/m??   Wt Readings from Last 12 Encounters:   02/10/17 75.7 kg (166 lb 12.8 oz)   12/01/16 72.6 kg (160 lb)   10/09/16 76.2 kg (167 lb 15.9 oz)   08/04/16 77.4 kg (170 lb 10.2 oz)   07/24/16 70.3 kg (155 lb)   06/19/16 80.4 kg (177 lb 4.8 oz)   05/18/16 79.4 kg (175 lb)   04/10/16 79 kg (174 lb 2.6 oz)   03/19/16 69.3 kg (152 lb 11.2 oz)   11/25/15 79.2 kg (174 lb 11.2 oz)   09/26/15 79.9 kg (176 lb 1.6 oz)   08/15/15 77.3 kg (170 lb 6.7 oz)     GEN: NAD noted  HEENT: EOMI, MMM, thyroid without nodules  CV: RR, S1, S2, no murmur  PULM: normal effort, CTAB  ABD: non-tender  MSK: normal tone  SKIN: warm and dry  NEURO: alert and oriented  PSYCH: pleasant, cooperative    BG/insulin reviewed per EMR.      Summary of labs:    Data Review  Lab Results   Component Value Date    A1C 10.5 (H) 02/01/2017    A1C 8.2 (H) 12/02/2016    A1C 8.8 (H) 07/28/2016     Lab Results   Component Value Date    GFR >= 60 10/31/2010    CREATININE 0.64 (L) 02/11/2017     Lab Results   Component Value Date    WBC 6.8 02/11/2017    HGB 13.6 02/11/2017    HCT 41.4 02/11/2017    PLT 210 02/11/2017       Lab Results   Component Value  Date    NA 136 02/11/2017    K 4.9 02/11/2017    CL 95 (L) 02/11/2017    CO2 30.0 02/11/2017    BUN 19 02/11/2017    CREATININE 0.64 (L) 02/11/2017    GLU 440 (HH) 02/11/2017    CALCIUM 9.1 02/11/2017    MG 1.5 (L) 02/11/2017    PHOS 3.7 03/27/2016       Lab Results   Component Value Date    BILITOT 0.4 02/04/2017    BILIDIR <0.10 02/04/2017    PROT 6.3 (L) 02/04/2017    ALBUMIN 3.2 (L) 02/04/2017    ALT 26 02/04/2017    AST 24 02/04/2017    ALKPHOS 156 (H) 02/04/2017    GGT 29 03/27/2016       Lab Results   Component Value Date    INR 0.96 02/09/2017    APTT 32.4 11/30/2016

## 2017-02-12 NOTE — Unmapped (Signed)
Problem: Cystic Fibrosis (Adult)  Goal: Signs and Symptoms of Listed Potential Problems Will be Absent, Minimized or Managed (Cystic Fibrosis)  Signs and symptoms of listed potential problems will be absent, minimized or managed by discharge/transition of care (reference Cystic Fibrosis (Adult) CPG).   Outcome: Progressing  Patient refused all of his scheduled inhalation treatments and airway clearance today. Notified RN.

## 2017-02-12 NOTE — Unmapped (Signed)
Problem: Patient Care Overview  Goal: Plan of Care Review  Outcome: Progressing  Pt afebrile with stable VS throughout the shift. No reports of pain. Pt received PM mealtime doses of insulin lispro later than scheduled because he ate dinner and an evening snack later than the scheduled times. All antibiotics administered; no adverse effects noted. Pt ambulated in the halls x2. No concerns noted. Will continue to monitor and follow the POC.   02/12/17 0350   OTHER   Plan of Care Reviewed With patient     Goal: Individualization and Mutuality  Outcome: Progressing    Goal: Discharge Needs Assessment   02/12/17 0350   Discharge Needs Assessment   Current Discharge Risk substance use/abuse;chronically ill       Problem: Cystic Fibrosis (Adult)  Goal: Signs and Symptoms of Listed Potential Problems Will be Absent, Minimized or Managed (Cystic Fibrosis)  Signs and symptoms of listed potential problems will be absent, minimized or managed by discharge/transition of care (reference Cystic Fibrosis (Adult) CPG).   Outcome: Progressing   02/11/17 1720 02/12/17 0350   Cystic Fibrosis (Adult)   Problems Assessed (Cystic Fibrosis) glycemic control impaired;pulmonary/respiratory complications --    Problems Present (Cystic Fibrosis) --  impaired glycemic control;pulmonary/respiratory complications       Problem: Pain, Acute (Adult)  Goal: Identify Related Risk Factors and Signs and Symptoms  Related risk factors and signs and symptoms are identified upon initiation of Human Response Clinical Practice Guideline (CPG).   Outcome: Progressing   02/03/17 1600   Pain, Acute (Adult)   Related Risk Factors (Acute Pain) disease process   Signs and Symptoms (Acute Pain) verbalization of pain descriptors     Goal: Acceptable Pain Control/Comfort Level  Patient will demonstrate the desired outcomes by discharge/transition of care.   Outcome: Progressing   02/12/17 0350   Pain, Acute (Adult)   Acceptable Pain Control/Comfort Level making progress toward outcome       Problem: VTE, DVT and PE (Adult)  Goal: Signs and Symptoms of Listed Potential Problems Will be Absent, Minimized or Managed (VTE, DVT and PE)  Signs and symptoms of listed potential problems will be absent, minimized or managed by discharge/transition of care (reference VTE, DVT and PE (Adult) CPG).   Outcome: Progressing   02/12/17 0350   VTE, DVT and PE (Adult)   Problems Assessed (VTE, DVT, PE) all   Problems Present (VTE, DVT, PE) none

## 2017-02-12 NOTE — Unmapped (Signed)
Problem: Patient Care Overview  Goal: Plan of Care Review  Outcome: Progressing  Patient is alert and oriented x3. No CP distress noted. Complain of headache and relieve with tylenol. Ambulatory  In the room. ABT continued through PICC without adverse reaction.   02/12/17 1632   OTHER   Plan of Care Reviewed With patient     Goal: Individualization and Mutuality  Outcome: Progressing    Goal: Discharge Needs Assessment  Outcome: Progressing   02/12/17 1632   Discharge Needs Assessment   Concerns to be Addressed no discharge needs identified     Goal: Interprofessional Rounds/Family Conf  Outcome: Progressing      Problem: Cystic Fibrosis (Adult)  Goal: Signs and Symptoms of Listed Potential Problems Will be Absent, Minimized or Managed (Cystic Fibrosis)  Signs and symptoms of listed potential problems will be absent, minimized or managed by discharge/transition of care (reference Cystic Fibrosis (Adult) CPG).   Outcome: Progressing   02/12/17 1632   Cystic Fibrosis (Adult)   Problems Assessed (Cystic Fibrosis) pulmonary/respiratory complications   Problems Present (Cystic Fibrosis) pulmonary/respiratory complications       Problem: Pain, Acute (Adult)  Goal: Identify Related Risk Factors and Signs and Symptoms  Related risk factors and signs and symptoms are identified upon initiation of Human Response Clinical Practice Guideline (CPG).   Outcome: Progressing      Problem: VTE, DVT and PE (Adult)  Goal: Signs and Symptoms of Listed Potential Problems Will be Absent, Minimized or Managed (VTE, DVT and PE)  Signs and symptoms of listed potential problems will be absent, minimized or managed by discharge/transition of care (reference VTE, DVT and PE (Adult) CPG).   Outcome: Progressing

## 2017-02-12 NOTE — Unmapped (Signed)
Problem: Patient Care Overview  Goal: Plan of Care Review  Outcome: Progressing  Patient is alert and oriented x3. No CP distress noted. Denies any pain or discomfort. FS at 0841=302. Insulin correction  8 units given with 12 units with breakfast at 1048. Patient consumed breakfast 100 %. FS at 12:27 67. MD aware.Patient drink 2 orange juice. Complain of headache. Repeat FS=142. Patient is ambulating in the room. ABT continued via PICC without adverse reaction.    02/11/17 1720   OTHER   Plan of Care Reviewed With patient     Goal: Individualization and Mutuality  Outcome: Progressing   02/11/17 1720   Individualization   Patient Specific Preferences request to sleep late in the morning.    Patient Specific Interventions pt allowed allowed to sleep until ready for his morning meds.      Goal: Discharge Needs Assessment  Outcome: Progressing   02/11/17 1720   Discharge Needs Assessment   Concerns to be Addressed no discharge needs identified     Goal: Interprofessional Rounds/Family Conf  Outcome: Progressing   02/11/17 1720   Interdisciplinary Rounds/Family Conf   Participants nursing       Problem: Cystic Fibrosis (Adult)  Goal: Signs and Symptoms of Listed Potential Problems Will be Absent, Minimized or Managed (Cystic Fibrosis)  Signs and symptoms of listed potential problems will be absent, minimized or managed by discharge/transition of care (reference Cystic Fibrosis (Adult) CPG).   Outcome: Progressing   02/11/17 1720   Cystic Fibrosis (Adult)   Problems Assessed (Cystic Fibrosis) glycemic control impaired;pulmonary/respiratory complications       Problem: Pain, Acute (Adult)  Goal: Identify Related Risk Factors and Signs and Symptoms  Related risk factors and signs and symptoms are identified upon initiation of Human Response Clinical Practice Guideline (CPG).   Outcome: Progressing    Goal: Acceptable Pain Control/Comfort Level  Patient will demonstrate the desired outcomes by discharge/transition of care. Outcome: Progressing   02/11/17 1720   Pain, Acute (Adult)   Acceptable Pain Control/Comfort Level making progress toward outcome       Problem: VTE, DVT and PE (Adult)  Goal: Signs and Symptoms of Listed Potential Problems Will be Absent, Minimized or Managed (VTE, DVT and PE)  Signs and symptoms of listed potential problems will be absent, minimized or managed by discharge/transition of care (reference VTE, DVT and PE (Adult) CPG).   Outcome: Progressing

## 2017-02-13 MED ORDER — PANTOPRAZOLE 40 MG TABLET,DELAYED RELEASE
ORAL_TABLET | Freq: Every day | ORAL | 1 refills | 0 days | Status: CP
Start: 2017-02-13 — End: 2017-03-15

## 2017-02-13 MED ORDER — CALCIUM CARBONATE 600 MG CALCIUM (1,500 MG) TABLET
ORAL_TABLET | Freq: Every day | ORAL | 2 refills | 0 days | Status: SS
Start: 2017-02-13 — End: 2017-06-06

## 2017-02-13 MED ORDER — FOLIC ACID 1 MG TABLET
ORAL_TABLET | Freq: Every day | ORAL | 1 refills | 0.00000 days | Status: SS
Start: 2017-02-13 — End: 2017-07-06

## 2017-02-13 MED ORDER — PEDIATRIC MULTIVITAMIN NO.61-VIT D3 1,500 UNIT-VIT K 800 MCG CAPSULE
ORAL_CAPSULE | Freq: Every day | ORAL | 1 refills | 0 days | Status: SS
Start: 2017-02-13 — End: 2017-07-07

## 2017-02-13 MED ORDER — KETOCONAZOLE 2 % TOPICAL CREAM
Freq: Every day | TOPICAL | 1 refills | 0 days | Status: SS
Start: 2017-02-13 — End: 2017-06-06

## 2017-02-13 MED ORDER — THIAMINE HCL (VITAMIN B1) 100 MG TABLET
ORAL_TABLET | Freq: Every day | ORAL | 1 refills | 0 days | Status: SS
Start: 2017-02-13 — End: 2017-06-06

## 2017-02-13 NOTE — Unmapped (Signed)
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    Cystic fibrosis with acute pulmonary exacerbation: (F508del L and I 506T), FEV1 35.3%, followed by Dr. Lurena Nida. FEV1 26% on 8/30 after 1 attempt.   - CF culture: 2+ mucoid Pseudomonas  - AFB smear neg  - Ceftazidime and tobramycin (8/21-9/3) plan for 2 wk course   - Bcx: NGTD  - Airway clearance: chest vest - Multiple providers discussed the importance of adherence with his breathing treatments and airway clearance with him today  - Home pulmozyme, Albuterol, 7% HTS   - Tylenol as needed for pain    Alcohol abuse: Last drink 8/19, no hx of complicated withdrawal, CIWA d/c  - Thiamine, folic acid, MVM    CF related diabetes mellitus: Uncontrolled, HbA1c 10.5%; uncontrolled BG during hospitalization given frequent untimed meals. Follows with Dr. Lyda Perone as an outpatient.   - Endocrine consulted, appreciate recommendations   - Lantus 25 units daily  - Lispro 16 units ACHS (cover 4 meals a day)  - SSI   - Meal trays to nurses and reinforced the importance of notifying nursing when he is eating     CF related pancreatic insufficiency:   - Zenpep 12 capsules w/ meals, 4 with snacks  - MVM w/ minerals     Vitamin D Def  - Vit D 50K daily while in house  - Recheck Vit D  9/1     Hepatic steatosis: complicated by alcohol use  - Urosodiol 900 mg BID (was not taking at home, no reasoning, able to afford, no s/e)    Tinea versicolor, improving:   - Ketoconazole cream  - Diflucan 300mg  x1    Substance-induced depressive disorder: Improved mood and interactions  - Psych following. Appreciate recs. Minimizing staff interruptions. Reinforcing necessity of nursing disconnecting IV line.   - Continue home Zyprexa  - Zoloft inreased to 200mg   - F/u for OP psych care He can contact his insurance carrier or Designer, industrial/product, [employer] [phone]).    Hep C Ab positive: Positive in the past, RNA not detected    Subjective  Patient notes decreased cough. Patient reports doing airway clearance but per RT he has not been doing inhaled treatments as scheduled. Additionally, per nursing he is still not notifying them when he is eating. Endocrine adjusted his insulin regimen yesterday.     Objective:    Vital signs in last 24 hours:  Temp:  [37.1 ??C-37.3 ??C] 37.1 ??C  Heart Rate:  [78-79] 79  Resp:  [17-20] 17  BP: (137-138)/(81-83) 137/81  MAP (mmHg):  [90-94] 90  SpO2:  [95 %-96 %] 95 %  Physical Exam:  General: NAD, lying in bed  Cardiovascular: Regular rate, normal rhythm. No murmurs, rubs or gallops.   Lung: Mild crackles in R mid and lower lobe, normal WOB on RA  Ext: PICC line on left c/d/i  Skin: Faint tinea versicolor rash on upper back and chest, much improved.  Neuro: alert  Psych: Improve affect but still with minimal eye contact.    ______________________________________________________________________  ??  Teaching Attending Attestation   ??  I saw the patient with Dr. Bruna Potter. We formulated a joint history as documented above. I examined the patient fully, evaluated the test results, and formulated the above assessment and plan as documented in the resident note. I agree with the plan for treatment and follow up as outlined.   ??  Active Inpatient Problem List includes:  1. Acute exacerbation of cystic fibrosis due to pseudomonas infection  2. Poor compliance with airway clearance, meds  3. History of alcohol abuse, pre-contemplative stage of change  4. CF related diabetes, on insulin, non compliant with dosing  5. Hypomagnesemia  6. Pancreatic insufficiency  7. Anorexia  ??  Plan for today includes: Continue IV ABX (ceftaz/tobra) through 9/4; needs entire course in house due to compliance history. Patient needs ongoing encouragement for use of airway clearance and meds. Patient reports symptom of anorexia and is open to trying Marinol as an appetite stimulant.   ??  I personally spent over half of a total 25??minutes in counseling and discussion with the patient and coordination of care as described above. Patient updated at bedside.   ??  Harrel Carina, MD  Pulmonary/Critical Care Medicine

## 2017-02-13 NOTE — Unmapped (Signed)
Aminoglycoside Follow-Up Pharmacy Note    Walter [name] is a 36 y.o. male on tobramycin 600 mg IV  every 24 hours for CF exacerbation.  Currently on day 11 of therapy.    High-dose CF extended infusion (1 hour infusion):  Goal peak: 20-30 mg/L  Goal trough: < 0.6 mg/L (6-8 hr drug free interval)    Measured peak = 14.3 mg/L (drawn 1.5 hours after the end of the infusion)  Calculated peak = 23.5 mg/L    Measured trough = 2.7 mg/L  Calculated trough = 0.0 mg/L    Patient-Specific Pharmacokinetic Parameters:  Vd = 21.8 L, ke = 0.33 hr-1    Based on levels, recommend continue at 600mg  every 24 hours.    Pharmacy will continue to monitor for changes in renal function, toxicity, and efficacy and order additional levels as needed. Please page service pharmacist with questions/clarifications.    Elesa Hacker, PharmD  PGY-1 Pharmacy Resident

## 2017-02-13 NOTE — Unmapped (Signed)
Problem: Cystic Fibrosis (Adult)  Goal: Signs and Symptoms of Listed Potential Problems Will be Absent, Minimized or Managed (Cystic Fibrosis)  Signs and symptoms of listed potential problems will be absent, minimized or managed by discharge/transition of care (reference Cystic Fibrosis (Adult) CPG).   Outcome: Progressing  RT entered patient's room, introduced self and explained to patient that scheduled medications were due, patient refused scheduled inhaled medications as well as patient assessment (auscultation, vital signs), will continue to monitor patient.

## 2017-02-13 NOTE — Unmapped (Signed)
Problem: Patient Care Overview  Goal: Plan of Care Review  Outcome: Progressing  Patient is alert and oriented x3. No CP distress noted. Denies any pain or discomfort. ABT continued via PICC without adverse reaction. Ambulatory in the room and in the hallway. Started on marinol po   02/13/17 1640   OTHER   Plan of Care Reviewed With patient    for appetite.   Goal: Individualization and Mutuality  Outcome: Progressing   02/13/17 1640   OTHER   What Anxieties, Fears, Concerns, or Questions Do You Have About Your Care? Reported to MD re: loss of appetite   How to Address Anxieties/Fears New order for Marinol po.       02/13/17 1640   OTHER   What Anxieties, Fears, Concerns, or Questions Do You Have About Your Care? Reported to MD re: loss of appetite   How to Address Anxieties/Fears New order for Marinol po.      Goal: Discharge Needs Assessment  Outcome: Progressing   02/13/17 1640   Discharge Needs Assessment   Concerns to be Addressed no discharge needs identified     Goal: Interprofessional Rounds/Family Conf  Outcome: Progressing   02/13/17 1640   Interdisciplinary Rounds/Family Conf   Participants nursing       Problem: Cystic Fibrosis (Adult)  Goal: Signs and Symptoms of Listed Potential Problems Will be Absent, Minimized or Managed (Cystic Fibrosis)  Signs and symptoms of listed potential problems will be absent, minimized or managed by discharge/transition of care (reference Cystic Fibrosis (Adult) CPG).   Outcome: Progressing   02/13/17 1640   Cystic Fibrosis (Adult)   Problems Assessed (Cystic Fibrosis) pulmonary/respiratory complications;undernutrition/vitamin and mineral deficiency   Problems Present (Cystic Fibrosis) pulmonary/respiratory complications;undernutrition/vitamin and mineral deficiency

## 2017-02-13 NOTE — Unmapped (Signed)
Problem: Patient Care Overview  Goal: Plan of Care Review  Outcome: Progressing  Pt refused VS; no complaints of fever or pain. Pt ambulated in the hallway x2. All medications administered. Will continue to monitor and follow the POC.   02/13/17 0413   OTHER   Plan of Care Reviewed With patient     Goal: Individualization and Mutuality  Outcome: Progressing    Goal: Discharge Needs Assessment  Outcome: Progressing   02/12/17 0350   Discharge Needs Assessment   Current Discharge Risk substance use/abuse;chronically ill       Problem: Cystic Fibrosis (Adult)  Goal: Signs and Symptoms of Listed Potential Problems Will be Absent, Minimized or Managed (Cystic Fibrosis)  Signs and symptoms of listed potential problems will be absent, minimized or managed by discharge/transition of care (reference Cystic Fibrosis (Adult) CPG).   Outcome: Progressing   02/12/17 1632   Cystic Fibrosis (Adult)   Problems Assessed (Cystic Fibrosis) pulmonary/respiratory complications   Problems Present (Cystic Fibrosis) pulmonary/respiratory complications       Problem: Pain, Acute (Adult)  Goal: Identify Related Risk Factors and Signs and Symptoms  Related risk factors and signs and symptoms are identified upon initiation of Human Response Clinical Practice Guideline (CPG).   Outcome: Progressing    Goal: Acceptable Pain Control/Comfort Level  Patient will demonstrate the desired outcomes by discharge/transition of care.   Outcome: Progressing   02/13/17 0413   Pain, Acute (Adult)   Acceptable Pain Control/Comfort Level making progress toward outcome       Problem: VTE, DVT and PE (Adult)  Goal: Signs and Symptoms of Listed Potential Problems Will be Absent, Minimized or Managed (VTE, DVT and PE)  Signs and symptoms of listed potential problems will be absent, minimized or managed by discharge/transition of care (reference VTE, DVT and PE (Adult) CPG).   Outcome: Progressing   02/13/17 0413   VTE, DVT and PE (Adult)   Problems Assessed (VTE, DVT, PE) all   Problems Present (VTE, DVT, PE) none

## 2017-02-14 MED ORDER — INSULIN ASPART (U-100) 100 UNIT/ML (3 ML) SUBCUTANEOUS PEN
Freq: Three times a day (TID) | SUBCUTANEOUS | 0 refills | 0 days | Status: SS
Start: 2017-02-14 — End: 2017-06-22

## 2017-02-14 MED ORDER — INSULIN GLARGINE (U-100) 100 UNIT/ML SUBCUTANEOUS SOLUTION
Freq: Every day | SUBCUTANEOUS | 0 refills | 0 days | Status: SS
Start: 2017-02-14 — End: 2017-06-22

## 2017-02-14 NOTE — Unmapped (Signed)
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    Cystic fibrosis with acute pulmonary exacerbation: (F508del L and I 506T), FEV1 35.3%, followed by Dr. Lurena Nida. FEV1 26% on 8/30 after 1 attempt.   - CF culture: 2+ mucoid Pseudomonas  - AFB smear neg  - Ceftazidime and tobramycin (8/21-9/3) plan for 2 wk course   - Bcx: NGTD  - Airway clearance: chest vest   - Home pulmozyme, Albuterol, 7% HTS   - Tylenol as needed for pain    Alcohol abuse: Last drink 8/19, no hx of complicated withdrawal, CIWA d/c  - Thiamine, folic acid, MVM    CF related diabetes mellitus: Uncontrolled, HbA1c 10.5%; uncontrolled BG during hospitalization given frequent untimed meals. Follows with Dr. Lyda Perone as an outpatient.   - Endocrine consulted, appreciate recommendations   - Lantus 25 units daily  - Lispro 16 units ACHS (cover 4 meals a day)  - SSI   - Meal trays to nurses     CF related pancreatic insufficiency:   - Zenpep 12 capsules w/ meals, 4 with snacks  - MVM w/ minerals     Anorexia: Patient complains of decreased appetite which reportedly is typical near the end of his exacerbations  - Trial of marinol for appetite stimulation    Vitamin D Def  - Vit D 50K daily while in house  - Recheck Vit D  9/1 pending    Hepatic steatosis: complicated by alcohol use  - Urosodiol 900 mg BID     Tinea versicolor, improving:   - Ketoconazole cream  - Diflucan 300mg  x1    Substance-induced depressive disorder: Improved mood and interactions  - Psych following. Appreciate recs. Minimizing staff interruptions. Reinforcing necessity of nursing disconnecting IV line.   - Continue home Zyprexa  - Zoloft inreased to 200mg   - F/u for OP psych care He can contact his insurance carrier or Designer, industrial/product, [employer] [phone]).    Hep C Ab positive: Positive in the past, RNA not detected    Subjective  Patient notes decreased cough and sputum production. Feels that marinol may be helping his appetite. Per review of RT notes he did one set of breathing Tx yesterday evening, refuse overnight treatments, and did not do airway clearance. He did ambulate around the unit.      Objective:    Vital signs in last 24 hours:  Temp:  [37.1 ??C-37.5 ??C] 37.1 ??C  Heart Rate:  [65-85] 65  Resp:  [16-18] 16  BP: (109-147)/(59-89) 109/59  MAP (mmHg):  [72] 72  SpO2:  [92 %-97 %] 92 %    Physical Exam:  General: NAD, lying in  bed  Cardiovascular: Regular rate, normal rhythm. No murmurs, rubs or gallops.   Lung: Mild crackles in R mid and lower lobe, normal WOB on RA  Ext: PICC line on left c/d/i  Skin: Faint tinea versicolor rash on abdomen  Neuro: alert  Psych: Improve affect but still with minimal eye contact.    ______________________________________________________________________  ??  Teaching Attending Attestation   ??  I saw the patient with Dr. Bruna Potter.??We formulated a joint history as documented above. I examined the patient fully, evaluated the test results, and formulated the above assessment and plan as documented in the resident note. I agree with the plan for treatment and follow up as outlined.   ??  Active Inpatient Problem List includes:  1. Acute exacerbation of cystic fibrosis due to pseudomonas infection  2. Poor compliance with airway clearance, meds  3. History of alcohol abuse, pre-contemplative stage of change  4. CF related diabetes, on insulin, non compliant with dosing  5. Hypomagnesemia  6. Pancreatic insufficiency  7. Anorexia  ??  Plan for today includes: Continue IV ABX (ceftaz/tobra). Recalculation of his dosing indicates that he will actually complete 2 weeks this evening. Given that he is eager to leave and is refusing airway clearance, nebs and insulin anyway, will plan to d/c tonight after last dose of insulin.     I personally spent over half of a total 25??minutes in counseling and discussion with the patient and coordination of care as described above. Patient updated at bedside.   ??  Harrel Carina, MD  Pulmonary/Critical Care Medicine

## 2017-02-14 NOTE — Unmapped (Signed)
Problem: Patient Care Overview  Goal: Plan of Care Review  Outcome: Progressing  Pt afebrile with stable VS. All medications administered. Pt refused breathing treatments per RT. Pt ambulated in the hallway x1 as of this note. No concerns noted. Will continue to monitor and follow the POC.   02/14/17 0301   OTHER   Plan of Care Reviewed With patient     Goal: Individualization and Mutuality  Outcome: Progressing   02/13/17 0413   Individualization   Patient Specific Preferences Pt prefers to eat dinner late     Goal: Discharge Needs Assessment  Outcome: Progressing   02/12/17 0350   Discharge Needs Assessment   Current Discharge Risk substance use/abuse;chronically ill       Problem: Cystic Fibrosis (Adult)  Goal: Signs and Symptoms of Listed Potential Problems Will be Absent, Minimized or Managed (Cystic Fibrosis)  Signs and symptoms of listed potential problems will be absent, minimized or managed by discharge/transition of care (reference Cystic Fibrosis (Adult) CPG).   Outcome: Progressing   02/13/17 1640   Cystic Fibrosis (Adult)   Problems Assessed (Cystic Fibrosis) pulmonary/respiratory complications;undernutrition/vitamin and mineral deficiency   Problems Present (Cystic Fibrosis) pulmonary/respiratory complications;undernutrition/vitamin and mineral deficiency       Problem: Pain, Acute (Adult)  Goal: Identify Related Risk Factors and Signs and Symptoms  Related risk factors and signs and symptoms are identified upon initiation of Human Response Clinical Practice Guideline (CPG).   Outcome: Progressing   02/03/17 1600   Pain, Acute (Adult)   Related Risk Factors (Acute Pain) disease process   Signs and Symptoms (Acute Pain) verbalization of pain descriptors     Goal: Acceptable Pain Control/Comfort Level  Patient will demonstrate the desired outcomes by discharge/transition of care.   Outcome: Progressing   02/14/17 0301   Pain, Acute (Adult)   Acceptable Pain Control/Comfort Level making progress toward outcome       Problem: VTE, DVT and PE (Adult)  Goal: Signs and Symptoms of Listed Potential Problems Will be Absent, Minimized or Managed (VTE, DVT and PE)  Signs and symptoms of listed potential problems will be absent, minimized or managed by discharge/transition of care (reference VTE, DVT and PE (Adult) CPG).   Outcome: Progressing   02/14/17 0301   VTE, DVT and PE (Adult)   Problems Assessed (VTE, DVT, PE) all   Problems Present (VTE, DVT, PE) none

## 2017-02-14 NOTE — Unmapped (Signed)
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    Outpatient Follow-up Items  1. Follow up with Walter [name] in Endocrinology regarding insulin regimen.  2. Follow up with Vitamin D supplementation dosing. Vitamin D level pending at time of discharge.   ??  Cystic fibrosis with acute pulmonary exacerbation: (F508del L and I 506T), FEV1 35.3%, followed by Walter [name]. Exacerbation characterized by chest pain, increased DOE. Prior cultures mPsA, sPsA, H. Flu.   His sputum culture from 8/21 grew mucoid Pseudomonas (Sensitive to Aztreonam, Cefepime, Imipenem, Meropenem, and Tobramycin, Intermediate to Ceftazidime and Zosyn). His AFB was negative. He underwent PICC placement on 8/28. He received IV Tobramycin and Ceftazidime from 8/20-9/2. He was encouraged to do Hypertonic Saline and Pulmozyme breathing treatments, as well as airway clearance with the chest vest, but would often refuse treatments during this admission. His repeat PFTs on 8/30 showed FEV1 26% with one attempt.   ??  Alcohol abuse: Last drink 8/19, no history of complicated withdrawal. He was initially on CIWA protocol which was discontinued on 8/24. He received thiamine, folic acid, and multivitamin supplementation.   ??  CF related diabetes mellitus: Uncontrolled, HbA1c 10.5%; uncontrolled BG during hospitalization given frequent untimed meals.  During his admission we increased his lantus 28 and meal time insulin to 16. On discharge he was instructed to resume his home regimen and follow up closely with Walter [name].      CF related pancreatic insufficiency: His home zenpep 12 capsules w/ meals, 4 with snacks and multi vitamin were continued.   ??  Vitamin D Deficiency: He received vitamin D 50K daily while in house. His vitamin D level recheck was pending at the time of discharge. He may require supplementation as an outpatient.    ??  Hepatic steatosis: Complicated by alcohol use. Resumed home Urosodiol 900 mg BID as he was not taking this reliable prior to admission. Encouraged him to do so in the future.   ??  Tinea versicolor, improving:  He received PO Fluconazole 300 mg once, Diflucan 300 mg once, and ketoconazole with significant improvement in his rash.   ??  Substance-induced depressive disorder: Psychiatry was consulted. His home zyprexa was continued, and his zoloft was increased to 200 mg.  Recommended outpatient psych care He can contact his insurance carrier or local Managed Care Organization, [employer] [phone]).  ??  Hep C Ab positive: Positive in the past, RNA negative this admission.     Procedures:  PICC line  ______________________________________________________________________    Discharge Day Services:  BP 109/59  - Pulse 65  - Temp 37.1 ??C (Oral)  - Resp 16  - Ht 185.4 cm (6' 1)  - Wt 75.7 kg (166 lb 12.8 oz)  - SpO2 92%  - BMI 22.01 kg/m??    General: NAD, lying in bed Cardiovascular: Regular rate, normal rhythm. No murmurs, rubs or gallops.   Lung: Mild crackles in R mid and lower lobe, normal WOB on RA  Ext: PICC line on left c/d/i  Skin: Faint tinea versicolor rash on abdomen  Neuro: alert  Psych: Improve affect but still with minimal eye contact.  Pt seen on the day of discharge and determined appropriate for discharge.    Condition at Discharge: stable  ______________________________________________________________________  Discharge Medications:     Your Medication List      STOP taking these medications    ibuprofen 200 MG tablet  Commonly known as:  ADVIL,MOTRIN        START taking these medications    calcium carbonate 600 mg calcium (1,500 mg) tablet  Commonly known as:  OS-CAL  Take 1 tablet (600 mg of elem calcium total) by mouth daily.     folic acid 1 MG tablet  Commonly known as:  FOLVITE  Take 1 tablet (1 mg total) by mouth daily.     insulin ASPART 100 unit/mL injection pen  Commonly known as:  NovoLOG FLEXPEN  Inject 0.08-0.12 mL (8-12 Units total) under the skin Three (3) times a day before meals.     ketoconazole 2 % shampoo  Commonly known as:  NIZORAL  Apply topically daily as needed (apply to rash when showering).     ketoconazole 2 % cream  Commonly known as:  NIZORAL  Apply 1 application topically daily.     MVW Complete (pediatric multivit 61-D3-vit K) 1,500-800 unit-mcg Cap  Take 2 capsules by mouth daily.     pantoprazole 40 MG tablet  Commonly known as:  PROTONIX  Take 1 tablet (40 mg total) by mouth daily.     thiamine 100 MG tablet  Commonly known as:  B-1  Take 1 tablet (100 mg total) by mouth daily.        CHANGE how you take these medications    albuterol 90 mcg/actuation inhaler  Commonly known as:  PROVENTIL HFA;VENTOLIN HFA  Inhale 2 puffs every six (6) hours as needed for wheezing.  What changed:  Another medication with the same name was added. Make sure you understand how and when to take each.     albuterol 2.5 mg /3 mL (0.083 %) nebulizer solution  Inhale 3 mL (2.5 mg total) by nebulization 4 (four) times a day.  What changed:  You were already taking a medication with the same name, and this prescription was added. Make sure you understand how and when to take each.     insulin glargine 100 unit/mL injection  Commonly known as:  LANTUS  Inject 0.2 mL (20 Units total) under the skin daily.  What changed:  how much to take     sertraline 100 MG tablet  Commonly known as:  ZOLOFT  Take 2 tablets (200 mg total) by mouth daily.  What changed:  how much to take        CONTINUE taking these medications    dornase alfa 1 mg/mL  nebulizer solution  Commonly known as:  PULMOZYME  Inhale 2.5 mg once daily.     gabapentin 400 MG capsule  Commonly known as:  NEURONTIN  Take 1 capsule (400 mg total) by mouth Three (3) times a day.     lipase-protease-amylase 20,000-68,000 -109,000 unit Cpdr capsule, delayed released  Commonly known as:  ZENPEP  Take 200,000 units of lipase by mouth Three (3) times a day with a meal. Take 10 caps with meals and 4-6 caps with snacks     OLANZapine 15 MG tablet  Commonly known as:  ZyPREXA  Take 1 tablet (15 mg total) by mouth nightly.     sodium chloride 7% 7 % Nebu  Inhale 4 mL by nebulization QID.     ursodiol 300 mg capsule  Commonly known as:  ACTIGALL  Take 3 capsules (900 mg total) by mouth Two (2) times a day.          ______________________________________________________________________  Pending Test Results (if blank, then none):   Order Current Status    Vitamin D 25 Hydroxy (25OH D2 + D3) In process    AFB culture Preliminary result          Most Recent Labs:  Microbiology Results (last day)     ** No results found for the last 24 hours. **          Lab Results   Component Value Date    WBC 6.8 02/11/2017    HGB 13.6 02/11/2017    HCT 41.4 02/11/2017    PLT 210 02/11/2017       Lab Results   Component Value Date    NA 136 02/11/2017    K 4.9 02/11/2017    CL 95 (L) 02/11/2017    CO2 30.0 02/11/2017    BUN 19 02/11/2017 CREATININE 0.64 (L) 02/11/2017    CALCIUM 9.1 02/11/2017    MG 1.5 (L) 02/11/2017    PHOS 3.7 03/27/2016       Lab Results   Component Value Date    ALKPHOS 156 (H) 02/04/2017    BILITOT 0.4 02/04/2017    BILIDIR <0.10 02/04/2017    PROT 6.3 (L) 02/04/2017    ALBUMIN 3.2 (L) 02/04/2017    ALT 26 02/04/2017    AST 24 02/04/2017    GGT 29 03/27/2016       Lab Results   Component Value Date    PT 10.9 02/09/2017    INR 0.96 02/09/2017    APTT 32.4 11/30/2016     Hospital Radiology:  Xr Chest 2 Views    Result Date: 02/01/2017  EXAM: CHEST TWO VIEW DATE: 02/01/2017 9:24 AM ACCESSION: [id number] UN DICTATED: 02/01/2017 9:26 AM INTERPRETATION LOCATION: [facility] CLINICAL INDICATION: 36 years old Male with DYSPNEA--  COMPARISON: 12/02/2016 TECHNIQUE: AP and lateral views of the chest. FINDINGS: Lungs well inflated. Multifocal pulmonary opacities, with mild decrease in opacities in the right base, but slight increase in patchy opacities in left midlung. Background sequela of cystic fibrosis. No pleural effusion or pneumothoraces. Cardiomediastinal silhouette is within normal limits. No osseous abnormalities.      -Multifocal pulmonary opacities, with decrease in right base, but increase in left midlung, which could represent mixed response to therapy or new site of infection.      ______________________________________________________________________    Discharge Instructions:     Follow Up instructions and Outpatient Referrals     Call MD for:  difficulty breathing, headache or visual disturbances  Call MD for:  redness, tenderness, or signs of infection (pain, swelling, redness, odor or green/yellow discharge around incision site)       Call MD for:  temperature >38.5 Celsius       Discharge instructions       You were admitted to [facility] for Cystic Fibrosis Pulmonary Exacerbation secondary to Pseudomonas infection. You were treated with IV Ceftazidime and Tobramycin. Your PFTs on 8/30 were FEV1 26%. Please continue your home pulmozyme and hypertonic saline breathing treatments as well as using your vest. This will help break up the secretions in your lungs.     Your blood sugars were high during your admission. Please resume your home insulin regimen of 20 units of lantus and 8-12 units of novolog with meals. Please follow up with Walter [name] as an outpatient. The clinic will call you to set up a time on Tuesday.     For your Mood and Depression, please continue your zyprexa 15 mg and you zoloft 100 mg. You are also encouraged to follow up with psychiatry as an outpatient . You can contact your insurance carrier or local Managed Care Organization, [employer] [phone]).    For the rest of your medications, please see the detailed list in your AVS.     If you have questions or concerns you may contact the [facility] Pulmonology clinic at [phone].               Length of Discharge: I spent greater than 30 mins in the discharge of this patient.    ______________________________________________________________________  ??  Teaching Attending Attestation   ??  I saw the patient with Dr. Thomes Lolling.??We formulated a joint history as documented above. I examined the patient fully, evaluated the test results, and formulated the above assessment and plan as documented in the resident note. I agree with the plan for treatment and follow up as outlined.   ??  Treated Inpatient Problem List includes:  1. Acute exacerbation of cystic fibrosis due to pseudomonas infection  2. Poor compliance with airway clearance, meds  3. History of alcohol abuse, pre-contemplative stage of change  4. CF related diabetes, on insulin, non compliant with dosing  5. Hypomagnesemia  6. Pancreatic insufficiency  7. Anorexia    See progress note from today.  ??  I personally spent over half of a total 25??minutes in counseling and discussion with the patient and coordination of care as described above. Patient updated at bedside.   ??  Harrel Carina, MD  Pulmonary/Critical Care Medicine

## 2017-02-14 NOTE — Unmapped (Signed)
Endocrinology Consult Note   Requesting Attending Physician : Konrad Dolores, *  Service Requesting Consult : Pulmonology (MDG)  Primary Care Provider: Dellis Filbert, MD  Outpatient Endocrinologist: Jackquline Denmark, MD (last outpatient visit 11/2015)    IMPRESSION:  Walter [name] is a 36 y.o. male admitted for Bronchiectasis with acute exacerbation (CMS-HCC). I have been asked to evaluate Walter [name] for hyperglycmia. We are consulted for uncontrolled diabetes.     RECOMMENDATIONS:  1. CF Related Diabetes, uncontrolled with recent A1C 10.5%:   -Patient does not want to change home regimen on discharge later today.  He states that he was not compliant with home insulin.  We recommend that he work on compliance for the next 4-8 weeks on this regimen, and then I have requested a f/u with Dr. Lyda Perone in the next few months.      2. Nutrition:  Variable po intake complicating overall glycemic control.     I have communicated recs with primary team.    Patient seen and discussed with Dr. Amalia Hailey.    HPI:  Walter Suttonis a 36 y.o.??male??with h/o CF c/b CFRD, alcohol abuse, bipolar disorder admitted for Cystic fibrosis exacerbation (RAF-HCC). I have been asked to evaluate Walter Suttonfor CFRD management. At home he is on Lantus 20u qAm and Novolog 8-16u premeal (does not carb count, estimates based on what he's eating and the size of the meal; add correction but again estimates). He sees Dr. Lyda Perone outpatient, last seen in June 2017. Last clinic note documents regimen of Lantus 14u and Novolog 4-8u plus SSI 2:50>200. A1C 8.8% in Feb, 8.2% in June and now 10.5%. No hypoglycemia at home. No known complications of diabetes.    Pt states he has been drinking a lot again and sort of gave up before this admission. States he wasn't taking care of his lungs nor his diabetes.     Current inpatient diabetes regimen:  lantus 36 units qAM  Lispro 10 units qid for meals  Lispro standard SS achs    Interval events: glucose 60-300's. Patient says he eats a lot more in the hospital.      ROS: Per HPI, otherwise remaining of 10 systems negative.    ??? albuterol  2.5 mg Nebulization 4x Daily (RT)   ??? calcium carbonate  600 mg of elem calcium Oral Daily   ??? cefTAZidime  2 g Intravenous Q8H   ??? dornase alfa  2.5 mg Inhalation Daily (RT)   ??? dronabinol  2.5 mg Oral BID   ??? ergocalciferol  50,000 Units Oral Daily   ??? folic acid  1 mg Oral Daily   ??? gabapentin  400 mg Oral TID   ??? heparin, porcine (PF)  200 Units Intravenous Q24H [name]   ??? [START ON 02/15/2017] insulin glargine  28 Units Subcutaneous Daily   ??? insulin lispro  0-12 Units Subcutaneous ACHS   ??? insulin lispro  16 Units Subcutaneous ACHS   ??? ketoconazole  1 application Topical Daily   ??? lipase-protease-amylase  12 capsule Oral 3xd Meals   ??? MVW Complete (pediatric multivit 61-D3-vit K)  2 capsule Oral Daily   ??? OLANZapine  15 mg Oral Nightly   ??? pantoprazole  40 mg Oral Daily   ??? phytonadione (vitamin K1)  5 mg Oral Tue,Sat   ??? polyethylene glycol  17 g Oral Daily   ??? sertraline  200 mg Oral Daily   ??? sodium chloride 7%  4 mL Nebulization 4x Daily (RT)   ???  thiamine  100 mg Oral Daily   ??? ursodiol  900 mg Oral BID       Past Medical History:   Diagnosis Date   ??? Alcohol abuse    ??? Bipolar disorder (CMS-HCC)    ??? Chronic pancreatitis (CMS-HCC)    ??? Chronic sinusitis    ??? 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 Cystic fibrosis (CMS-HCC)    ??? Diabetes mellitus (CMS-HCC)     dx in-house 2015   ??? Kidney stones    ??? Portal hypertension (CMS-HCC)        Family History   Problem Relation Age of Onset   ??? Diabetes Maternal Grandmother    ??? Diabetes Maternal Grandfather    ??? No Known Problems Mother    ??? Cancer Father    ??? No Known Problems Sister        Social History   Substance Use Topics   ??? Smoking status: Former Smoker     Types: Cigarettes   ??? Smokeless tobacco: Never Used   ??? Alcohol use 9.6 oz/week     16 Cans of beer per week       PHYSICAL EXAMINATION:  BP 109/59  - Pulse 65  - Temp 37.1 ??C (Oral)  - Resp 16  - Ht 185.4 cm (6' 1)  - Wt 75.7 kg (166 lb 12.8 oz)  - SpO2 92%  - BMI 22.01 kg/m??   Wt Readings from Last 12 Encounters:   02/10/17 75.7 kg (166 lb 12.8 oz)   12/01/16 72.6 kg (160 lb)   10/09/16 76.2 kg (167 lb 15.9 oz)   08/04/16 77.4 kg (170 lb 10.2 oz)   07/24/16 70.3 kg (155 lb)   06/19/16 80.4 kg (177 lb 4.8 oz)   05/18/16 79.4 kg (175 lb)   04/10/16 79 kg (174 lb 2.6 oz)   03/19/16 69.3 kg (152 lb 11.2 oz)   11/25/15 79.2 kg (174 lb 11.2 oz)   09/26/15 79.9 kg (176 lb 1.6 oz)   08/15/15 77.3 kg (170 lb 6.7 oz)     GEN: NAD noted  MSK: normal tone  SKIN: warm and dry  NEURO: alert and oriented  PSYCH: pleasant, cooperative    BG/insulin reviewed per EMR.      Summary of labs:    Data Review  Lab Results   Component Value Date    A1C 10.5 (H) 02/01/2017    A1C 8.2 (H) 12/02/2016    A1C 8.8 (H) 07/28/2016     Lab Results   Component Value Date    GFR >= 60 10/31/2010    CREATININE 0.64 (L) 02/11/2017     Lab Results   Component Value Date    WBC 6.8 02/11/2017    HGB 13.6 02/11/2017    HCT 41.4 02/11/2017    PLT 210 02/11/2017       Lab Results   Component Value Date    NA 136 02/11/2017    K 4.9 02/11/2017    CL 95 (L) 02/11/2017    CO2 30.0 02/11/2017    BUN 19 02/11/2017    CREATININE 0.64 (L) 02/11/2017    GLU 440 (HH) 02/11/2017    CALCIUM 9.1 02/11/2017    MG 1.5 (L) 02/11/2017    PHOS 3.7 03/27/2016       Lab Results   Component Value Date    BILITOT 0.4 02/04/2017    BILIDIR <0.10 02/04/2017    PROT 6.3 (L)  02/04/2017    ALBUMIN 3.2 (L) 02/04/2017    ALT 26 02/04/2017    AST 24 02/04/2017    ALKPHOS 156 (H) 02/04/2017    GGT 29 03/27/2016       Lab Results   Component Value Date    INR 0.96 02/09/2017    APTT 32.4 11/30/2016

## 2017-02-14 NOTE — Unmapped (Signed)
Left brachial vein PICC line removed, measured 47cm.

## 2017-02-14 NOTE — Unmapped (Signed)
Problem: Cystic Fibrosis (Adult)  Goal: Signs and Symptoms of Listed Potential Problems Will be Absent, Minimized or Managed (Cystic Fibrosis)  Signs and symptoms of listed potential problems will be absent, minimized or managed by discharge/transition of care (reference Cystic Fibrosis (Adult) CPG).   Patient refused all treatments and airway clearance overnight after walking around unit.  Vital signs were stable with no visible sign of respiratory distress noted.

## 2017-02-16 LAB — VITAMIN D, TOTAL (25OH): Lab: 23.1

## 2017-02-20 ENCOUNTER — Emergency Department
Admission: EM | Admit: 2017-02-20 | Discharge: 2017-02-20 | Disposition: A | Discharge status: Home / Self Care | Payer: BC Managed Care – PPO | Source: Intra-hospital

## 2017-02-20 DIAGNOSIS — K861 Other chronic pancreatitis: Principal | ICD-10-CM

## 2017-02-20 LAB — WBC ADJUSTED: Lab: 7.8

## 2017-02-20 LAB — CBC W/ AUTO DIFF
BASOPHILS ABSOLUTE COUNT: 0.1 10*9/L (ref 0.0–0.1)
BASOPHILS RELATIVE PERCENT: 1 %
EOSINOPHILS ABSOLUTE COUNT: 0 10*9/L (ref 0.0–0.7)
EOSINOPHILS RELATIVE PERCENT: 0.6 %
HEMATOCRIT: 41.1 % (ref 39.0–52.0)
HEMOGLOBIN: 14.7 g/dL (ref 13.0–17.0)
LYMPHOCYTES ABSOLUTE COUNT: 2.2 10*9/L (ref 0.7–4.0)
LYMPHOCYTES RELATIVE PERCENT: 28.1 %
MEAN CORPUSCULAR HEMOGLOBIN: 30.7 pg (ref 26.0–34.0)
MEAN CORPUSCULAR VOLUME: 85.5 fL (ref 78.0–100.0)
MEAN PLATELET VOLUME: 7 fL (ref 7.0–10.0)
MONOCYTES ABSOLUTE COUNT: 0.2 10*9/L (ref 0.1–1.0)
MONOCYTES RELATIVE PERCENT: 2.5 %
NEUTROPHILS ABSOLUTE COUNT: 5.3 10*9/L (ref 1.7–7.7)
NEUTROPHILS RELATIVE PERCENT: 67.8 %
PLATELET COUNT: 199 10*9/L (ref 150–400)
RED CELL DISTRIBUTION WIDTH: 14.2 % (ref 11.5–15.5)
WBC ADJUSTED: 7.8 10*9/L (ref 4.0–10.5)

## 2017-02-20 LAB — LIPASE: Chemistry studies:Cmplx:-:^Patient:Set:: 19 — ABNORMAL LOW

## 2017-02-20 LAB — COMPREHENSIVE METABOLIC PANEL
ALBUMIN: 3.8 g/dL (ref 3.4–5.0)
ALKALINE PHOSPHATASE: 181 U/L — ABNORMAL HIGH (ref 45–117)
ALT (SGPT): 58 U/L (ref 16–61)
ANION GAP: 11 mmol/L (ref 3–11)
AST (SGOT): 60 U/L — ABNORMAL HIGH (ref 15–37)
BLOOD UREA NITROGEN: 20 mg/dL — ABNORMAL HIGH (ref 7–18)
BUN / CREAT RATIO: 25
CALCIUM: 7.8 mg/dL — ABNORMAL LOW (ref 8.5–10.1)
CHLORIDE: 95 mmol/L — ABNORMAL LOW (ref 98–107)
CO2: 30 mmol/L (ref 21.0–32.0)
CREATININE: 0.81 mg/dL (ref 0.70–1.30)
EGFR MDRD AF AMER: >60 mL/min/{1.73_m2}
EGFR MDRD NON AF AMER: >60 mL/min/{1.73_m2}
GLUCOSE RANDOM: 325 mg/dL — ABNORMAL HIGH (ref 65–99)
POTASSIUM: 3.9 mmol/L (ref 3.5–5.1)
PROTEIN TOTAL: 8.2 g/dL (ref 6.4–8.2)

## 2017-02-20 LAB — BETA-HYDROXYBUTYRATE: Lab: 0.19

## 2017-02-20 LAB — MAGNESIUM: Chemistry studies:Cmplx:-:^Patient:Set:: 2.2

## 2017-02-20 LAB — ETHANOL
ETHANOL: 159 mg/dL — ABNORMAL HIGH (ref <=20.0)
Ethanol:MCnc:Pt:Ser/Plas:Qn:GC: 159 — ABNORMAL HIGH

## 2017-02-20 LAB — CALCIUM: Chemistry studies:Cmplx:-:^Patient:Set:: 7.8 — ABNORMAL LOW

## 2017-02-20 MED ORDER — TRAMADOL 50 MG TABLET
ORAL_TABLET | Freq: Four times a day (QID) | ORAL | 0 refills | 0.00000 days | Status: CP | PRN
Start: 2017-02-20 — End: 2017-02-25

## 2017-02-20 MED ORDER — ONDANSETRON 4 MG DISINTEGRATING TABLET
ORAL_TABLET | Freq: Three times a day (TID) | ORAL | 0 refills | 0.00000 days | Status: CP | PRN
Start: 2017-02-20 — End: 2017-02-27

## 2017-02-20 NOTE — Unmapped (Signed)
Pt presents to ED with c/o abdominal pain with N/V. Pt has hx of pancreatitis. Pt admits to being alcoholic and unable to quantify the amount he drinks on a daily basis, last drink was 1 hour ago. Pt also diabetic, BS 399 per EMS.
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
## 2017-02-20 NOTE — Unmapped (Signed)
Bed: 22  Expected date:   Expected time:   Means of arrival:   Comments:  EMS-2m abd pain/ETOH

## 2017-02-22 NOTE — Unmapped (Addendum)
Pulmonary Clinic Pharmacist Note: Next Fill Call    February 22, 2017 12:39 PM     Unable to leave voicemail for Erick Murin Weedon to call back regarding next delivery of CF Medications from [facility].  Last sent Pulmozyme to patient on 11/28/16 and Hypertonic Saline on 2/29/18.  Patient's VM box was full as it has been last several call attempts.    Rescheduled RF coordination call attempt rescheduled for 1 week from today.    Anell Barr, PharmD, BCPS, CPP  Clinical Pharmacist Practitioner  [facility] / [facility]  Pager: [phone]    CC: Serita Grit, LCSW, Barnie Alderman

## 2017-03-09 NOTE — Unmapped (Signed)
Pulmonary Clinic Pharmacist Note: Overdue Refills     March 09, 2017 12:11 PM   Unable to LVM, VM box full.  Attempted to reach Walter [name] to call back regarding next delivery of CF Medications from [facility].  Last sent to patient on 11/28/16 (Pulmozyme).  Provided patient [name] Phone [phone] option 4.  Rescheduled refill call attempts for 2 week follow-up.  Emailed patient with listed email in EPIC.    Anell Barr, PharmD, BCPS, CPP  Clinical Pharmacist Practitioner  [facility] / [facility]  Pager: [phone]

## 2017-03-19 NOTE — Unmapped (Signed)
Entered mental health flowsheets.

## 2017-03-24 MED ORDER — LIPASE-PROTEASE-AMYLASE 20,000-68,000-109,000 UNIT CAPSULE,DELAYED REL
ORAL_CAPSULE | Freq: Three times a day (TID) | ORAL | 0 refills | 0.00000 days | Status: CP
Start: 2017-03-24 — End: 2017-03-24

## 2017-03-24 MED ORDER — LIPASE-PROTEASE-AMYLASE 40,000-126,000-168,000 UNIT CAPSULE, DELAY REL
ORAL_CAPSULE | Freq: Three times a day (TID) | ORAL | 0 refills | 0 days | Status: CP
Start: 2017-03-24 — End: 2017-05-13

## 2017-03-24 NOTE — Unmapped (Signed)
Spoke with [facility]. Unable to provide the amount of Creon that is recommended by our CF dietitian. Asking that we prescribe Woody's current medication to a pharmacy close to the hospital. One month supply of Zenpep 40,000, 6 capsules with meals, 3 capsules with snacks sent in to the local [facility].     Called the pharmacy, as I mistakenly sent in two prescriptions, the latter being the correct one. Pharmacy reports the Insurance or the prescription is in a grace period and would be costing the patient $9000. Will route issue to Turner Daniels, the pulmonary clinic's Pharmacy Transition Specialist.

## 2017-03-24 NOTE — Unmapped (Signed)
Addended by: Cicero Duck on: 03/24/2017 04:05 PM     Modules accepted: Orders

## 2017-03-24 NOTE — Unmapped (Signed)
Received a call from  [place] at [facility] ([facility]). Jodi is going to be there for a week. Asking about enzymes, as they only have Creon 24,000 available to them. Will notify Toni Amend our CF dietitian and get back to [facility] for advice on dosing this brand of enzymes. They report Franki's weight as 160 pounds.    Will also fax current medication list to the facility at [phone].   Phone # for medical: [phone] ext [phone].     [name], RN  CF Nurse Coordinator   [phone]

## 2017-04-07 NOTE — Unmapped (Signed)
Per Margaretmary Lombard, unable to reach [place] by phone and mailbox was full.  Margaretmary Lombard reports that when things are in a grace period  there's an issue with the patient's premium and should be encouraged to call customer service.     Left voicemail with Ege requesting a return call.

## 2017-04-18 ENCOUNTER — Inpatient Hospital Stay
Admission: EM | Admit: 2017-04-18 | Discharge: 2017-04-26 | Discharge status: Against Medical Advice | Source: Intra-hospital | Attending: Pulmonary Disease

## 2017-04-18 ENCOUNTER — Inpatient Hospital Stay
Admission: EM | Admit: 2017-04-18 | Discharge: 2017-04-26 | Discharge status: Against Medical Advice | Payer: BC Managed Care – PPO | Source: Intra-hospital

## 2017-04-18 DIAGNOSIS — F1010 Alcohol abuse, uncomplicated: Principal | ICD-10-CM

## 2017-04-18 LAB — SODIUM: Sodium:SCnc:Pt:Ser/Plas:Qn:: 138

## 2017-04-18 LAB — HEPATIC FUNCTION PANEL
ALBUMIN: 3.2 g/dL — ABNORMAL LOW (ref 3.5–5.0)
AST (SGOT): 29 U/L (ref 19–55)
BILIRUBIN DIRECT: 0.2 mg/dL (ref 0.00–0.40)
BILIRUBIN TOTAL: 0.7 mg/dL (ref 0.0–1.2)
PROTEIN TOTAL: 6.3 g/dL — ABNORMAL LOW (ref 6.5–8.3)

## 2017-04-18 LAB — CBC W/ AUTO DIFF
BASOPHILS ABSOLUTE COUNT: 0 10*9/L (ref 0.0–0.1)
EOSINOPHILS ABSOLUTE COUNT: 0.2 10*9/L (ref 0.0–0.4)
HEMATOCRIT: 45.4 % (ref 41.0–53.0)
HEMOGLOBIN: 15.4 g/dL (ref 13.5–17.5)
LYMPHOCYTES ABSOLUTE COUNT: 2.1 10*9/L (ref 1.5–5.0)
MEAN CORPUSCULAR HEMOGLOBIN CONC: 33.9 g/dL (ref 31.0–37.0)
MEAN CORPUSCULAR HEMOGLOBIN: 30.4 pg (ref 26.0–34.0)
MEAN CORPUSCULAR VOLUME: 89.8 fL (ref 80.0–100.0)
MEAN PLATELET VOLUME: 7.3 fL (ref 7.0–10.0)
MONOCYTES ABSOLUTE COUNT: 0.2 10*9/L (ref 0.2–0.8)
NEUTROPHILS ABSOLUTE COUNT: 4.1 10*9/L (ref 2.0–7.5)
PLATELET COUNT: 119 10*9/L — ABNORMAL LOW (ref 150–440)
RED BLOOD CELL COUNT: 5.06 10*12/L (ref 4.50–5.90)
RED CELL DISTRIBUTION WIDTH: 15 % (ref 12.0–15.0)
WBC ADJUSTED: 6.7 10*9/L (ref 4.5–11.0)

## 2017-04-18 LAB — AST (SGOT): Aspartate aminotransferase:CCnc:Pt:Ser/Plas:Qn:: 29

## 2017-04-18 LAB — URINALYSIS
BILIRUBIN UA: NEGATIVE
BLOOD UA: NEGATIVE
HYALINE CASTS: 1 /LPF (ref 0–1)
KETONES UA: NEGATIVE
LEUKOCYTE ESTERASE UA: NEGATIVE
NITRITE UA: NEGATIVE
PH UA: 5.5 (ref 5.0–9.0)
PROTEIN UA: 30 — AB
RBC UA: 1 /HPF (ref <3)
SPECIFIC GRAVITY UA: 1.022 (ref 1.003–1.030)
SQUAMOUS EPITHELIAL: <1 /HPF (ref 0–5)
UROBILINOGEN UA: 0.2

## 2017-04-18 LAB — COMPREHENSIVE METABOLIC PANEL
ANION GAP: 16 mmol/L — ABNORMAL HIGH (ref 9–15)
BLOOD UREA NITROGEN: 6 mg/dL — ABNORMAL LOW (ref 7–21)
BUN / CREAT RATIO: 10
CALCIUM: 8.1 mg/dL — ABNORMAL LOW (ref 8.5–10.2)
CHLORIDE: 99 mmol/L (ref 98–107)
CO2: 23 mmol/L (ref 22.0–30.0)
CREATININE: 0.63 mg/dL — ABNORMAL LOW (ref 0.70–1.30)
EGFR MDRD AF AMER: >=60 mL/min/{1.73_m2} (ref >=60)
GLUCOSE RANDOM: 281 mg/dL — ABNORMAL HIGH (ref 65–179)
SODIUM: 138 mmol/L (ref 135–145)

## 2017-04-18 LAB — PROTIME: Lab: 12.2

## 2017-04-18 LAB — PHOSPHORUS: Phosphate:MCnc:Pt:Ser/Plas:Qn:: 3.1

## 2017-04-18 LAB — THYROID STIMULATING HORMONE: Thyrotropin:ACnc:Pt:Ser/Plas:Qn:: 1.597

## 2017-04-18 LAB — TOXICOLOGY SCREEN, URINE
AMPHETAMINE SCREEN URINE: <500
BARBITURATE SCREEN URINE: <200
BENZODIAZEPINE SCREEN, URINE: <200
CANNABINOID SCREEN URINE: <20

## 2017-04-18 LAB — BETA-HYDROXYBUTYRATE: Lab: 0.26

## 2017-04-18 LAB — BACTERIA

## 2017-04-18 LAB — POTASSIUM: Potassium:SCnc:Pt:Ser/Plas:Qn:: 4.2

## 2017-04-18 LAB — PROTIME-INR: PROTIME: 12.2 s (ref 10.2–12.8)

## 2017-04-18 LAB — ETHANOL: Ethanol:MCnc:Pt:Ser/Plas:Qn:GC: <10

## 2017-04-18 LAB — CANNABINOID SCREEN URINE: Lab: <20

## 2017-04-18 LAB — LIPASE: Triacylglycerol lipase:CCnc:Pt:Ser/Plas:Qn:: <10 — ABNORMAL LOW

## 2017-04-18 LAB — MAGNESIUM: Magnesium:MCnc:Pt:Ser/Plas:Qn:: 1.5 — ABNORMAL LOW

## 2017-04-18 LAB — MEAN CORPUSCULAR HEMOGLOBIN CONC: Lab: 33.9

## 2017-04-18 NOTE — Unmapped (Signed)
Patient rounds completed. The following patient needs were addressed:  Personal Belongings, Plan of Care, Call Bell in [place], Bed Position Low and pt resting on stretcher at this time. VSS. NAD.

## 2017-04-18 NOTE — Unmapped (Signed)
Patient transported to X-ray  Transported by Radiology  How tranported Stretcher  Cardiac Monitor yes

## 2017-04-18 NOTE — Unmapped (Signed)
Pt reports difficulty breathing, worsening over the last couple days. Hx CF.   Pt also reports drinking about 3-6 cans of 24oz 9% alcohol over the last 2 weeks, with c/o abdominal pain.

## 2017-04-18 NOTE — Unmapped (Signed)
Patient transported to X-ray  Transported by Nurse  How tranported Wheelchair  Cardiac Monitor no

## 2017-04-18 NOTE — Unmapped (Signed)
Phlebotomy at bedside.

## 2017-04-18 NOTE — Unmapped (Signed)
[facility]  Emergency Department Provider Note  ED Clinical Impression     Final diagnoses:   Cystic fibrosis exacerbation (CMS-HCC) (Primary)   ETOH abuse       Initial Impression, ED Course, Assessment and Plan     36 year old male with a history of cystic fibrosis and alcohol abuse presenting to the emergency department for shortness of breath and epigastric pain. In regards to shortness of breath. He states that he has been worse for the past 3 days with a productive cough. He is having difficulty walking up 2 flights of stairs which is new for him. He states this feels like prior cystic fibrosis exacerbations. He has not been doing routine breathing treatments, chest PT. He was last admitted approximately 2 months ago for a CF exacerbation. On exam, he is satting 97% on room air with no tachypnea and coarse breath sounds in the bases. Concern for possible CHF exacerbation given his decreased exercise tolerance. In regard to his epigastric pain, the patient has had a significant amount of drinking. On exam he has tenderness to palpation of his epigastrium. Concerning for alcoholic gastritis versus chronic pancreatitis. Plan for CMP, lipase, CBC, chest x-ray, IV fluids, antiemetics and pain medication.     6:29 AM  Patient's labs are hemolyzed but his lipase is less than 10 however, the patient has poor pancreatic function and likely is chronic alcoholic pancreatitis versus alcoholic gastritis. I have spoke with the admitting team and they will be down to evaluate the patient.     ____________________________________________  I independently visualized the radiology images.   I reviewed the patient's prior medical records.   I discussed the case with the admitting provider   Time seen: April 18, 2017 4:37 AM    I have reviewed the triage vital signs and the nursing notes.  I have discussed the case with the ED Attending, Dr. Dimple Casey.     Labs and radiology results that were available during my care of the patient were independently reviewed by me and considered in my medical decision making.    Portions of this record have been created using Scientist, clinical (histocompatibility and immunogenetics). Dictation errors have been sought, but may not have been identified and corrected.    History     Chief Complaint  Difficulty Breathing    HPI   Walter [name] is a 36 y.o. male with a past medical history of cystic fibrosis, alcoholism, medical noncompliance that is presenting to the emergency department for evaluation shortness of breath for the past 3 days. He is getting so short of breath that he can barely walk up a few flight of stairs. He states this feels like prior CHF exacerbations that he has had in the past. He has not had any fevers is having a productive cough. He has not been doing any breathing treatments recently.    Of note, the patient also is having severe epigastric abdominal pain in the setting of significant alcohol abuse. He describes as a sharp stabbing pain that radiates to his back. He has been drinking almost constantly for the past 2 weeks after he got out of an alcohol rehabilitation center. He is also been out of his Zenpep because of a cost issue and therefore is having profuse diarrhea. The pain has associated nausea.      Past Medical History:   Diagnosis Date   ??? Alcohol abuse    ??? Bipolar disorder (CMS-HCC)    ??? Chronic pancreatitis (CMS-HCC)    ???  Chronic sinusitis    ??? 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 Cystic fibrosis (CMS-HCC)    ??? Diabetes mellitus (CMS-HCC)     dx in-house 2015   ??? Kidney stones    ??? Portal hypertension (CMS-HCC)        Past Surgical History:   Procedure Laterality Date   ??? CHOLECYSTECTOMY  2008   ??? sinus surgery     ??? TONSILLECTOMY            Current Facility-Administered Medications:   ???  albuterol 2.5 mg /3 mL (0.083 %) nebulizer solution 5 mg, 5 mg, Nebulization, Once, Verlin Dike, MD  ???  ipratropium (ATROVENT) 0.02 % nebulizer solution 500 mcg, 500 mcg, Nebulization, Once, Verlin Dike, MD  ???  sodium chloride 3 % nebulizer solution 4 mL, 4 mL, Nebulization, Once, Verlin Dike, MD    Current Outpatient Prescriptions:   ???  albuterol (PROVENTIL HFA;VENTOLIN HFA) 90 mcg/actuation inhaler, Inhale 2 puffs every six (6) hours as needed for wheezing., Disp: 1 Inhaler, Rfl: 2  ???  albuterol 2.5 mg /3 mL (0.083 %) nebulizer solution, Inhale 3 mL (2.5 mg total) by nebulization 4 (four) times a day., Disp: 360 mL, Rfl: 11  ???  calcium carbonate (OS-CAL) 600 mg calcium (1,500 mg) tablet, Take 1 tablet (600 mg of elem calcium total) by mouth daily., Disp: 30 tablet, Rfl: 2  ???  folic acid (FOLVITE) 1 MG tablet, Take 1 tablet (1 mg total) by mouth daily., Disp: 30 tablet, Rfl: 1  ???  gabapentin (NEURONTIN) 400 MG capsule, Take 1 capsule (400 mg total) by mouth Three (3) times a day., Disp: 90 capsule, Rfl: 1  ???  insulin ASPART (NOVOLOG FLEXPEN) 100 unit/mL injection pen, Inject 0.08-0.12 mL (8-12 Units total) under the skin Three (3) times a day before meals., Disp: 10.8 mL, Rfl: 0  ???  insulin glargine (LANTUS) 100 unit/mL injection, Inject 0.2 mL (20 Units total) under the skin daily., Disp: 6 mL, Rfl: 0  ???  ketoconazole (NIZORAL) 2 % cream, Apply 1 application topically daily., Disp: 30 g, Rfl: 1  ???  lipase-protease-amylase (ZENPEP) 40,000-126,000- 168,000 unit CpDR, Take 6 capsules by mouth Three (3) times a day. 3 capsules with snacks three times a day, Disp: 800 capsule, Rfl: 0  ???  MVW Complete, pediatric multivit 61-D3-vit K, 1,500-800 unit-mcg cap, Take 2 capsules by mouth daily., Disp: 60 capsule, Rfl: 1  ???  OLANZapine (ZYPREXA) 15 MG tablet, Take 1 tablet (15 mg total) by mouth nightly., Disp: 30 tablet, Rfl: 1  ???  sertraline (ZOLOFT) 100 MG tablet, Take 2 tablets (200 mg total) by mouth daily., Disp: 60 tablet, Rfl: 1  ???  sodium chloride 7% 7 % Nebu, Inhale 4 mL by nebulization QID., Disp: , Rfl:   ???  thiamine (B-1) 100 MG tablet, Take 1 tablet (100 mg total) by mouth daily., Disp: 30 tablet, Rfl: 1    Allergies  Quetiapine    Family History   Problem Relation Age of Onset   ??? Diabetes Maternal Grandmother    ??? Diabetes Maternal Grandfather    ??? No Known Problems Mother    ??? Cancer Father    ??? No Known Problems Sister        Social History  Social History   Substance Use Topics   ??? Smoking status: Former Smoker     Types: Cigarettes   ??? Smokeless tobacco: Never Used   ???  Alcohol use 11.4 oz/week     16 Cans of beer, 3 Glasses of wine per week       Review of Systems  Constitutional: Negative for fever.  Eyes: Negative for visual changes.  ENT: Negative for sore throat.  Cardiovascular: Negative for chest pain.  Respiratory: Negative for shortness of breath.  Gastrointestinal: +for abdominal pain, vomiting and diarrhea.  Genitourinary: Negative for dysuria.  Musculoskeletal: Negative for back pain.  Skin: Negative for rash.  Neurological: Negative for headaches, focal weakness or numbness.      Physical Exam     VITAL SIGNS:    ED Triage Vitals [04/18/17 0409]   Enc Vitals Group      BP 143/94      Heart Rate 87      SpO2 Pulse       Resp 18      Temp 37.2 ??C (98.9 ??F)      Temp Source Temporal      SpO2 97 %     General: Generally uncomfortable-appearing sitting on stretcher  Eyes: Conjunctivae are normal.  ENT       Head: Normocephalic and atraumatic.       Nose: No congestion.       Mouth/Throat: Mucous membranes are moist.       Neck: No stridor.  Hematological/Lymphatic/Immunilogical: No cervical lymphadenopathy.  Cardiovascular: Regular rate and rhythm, Normal S1 and S2. No murmurs appreciated.  Respiratory: Coarse breath sounds bilaterally especially in the bases. No tachypnea. Satting 97% on room air.  Gastrointestinal: Abdomen is soft tenderness to palpation of the epigastrium.  Musculoskeletal: Nontender with normal range of motion in all extremities.       Right lower leg: No tenderness or edema.       Left lower leg: No tenderness or edema.  Neurologic: Normal speech and language. No gross focal neurologic deficits are appreciated.  Skin: Skin is warm, dry and intact. No rash noted.  Psychiatric: Mood and affect are normal. Speech and behavior are normal.    Radiology/EKG     CXR pending    Procedures     None         Verlin Dike, MD  Resident  04/18/17 [phone]

## 2017-04-18 NOTE — Unmapped (Signed)
Report given to Mary Scott, RN

## 2017-04-18 NOTE — Unmapped (Signed)
Aminoglycoside Initiation Pharmacy Note    [name] is a 36 y.o. male being initiated on tobramycin for CF exacerbation.    Goal peak: 20-30  Goal trough: <0.6 mg/L (6-8 hr drug free interval)    Pharmacokinetic Parameters:    Wt Readings from Last 1 Encounters:   02/20/17 75.3 kg (166 lb)     Ideal body weight: 80 kg, Adjusted body weight: 78 kg    Lab Results   Component Value Date    CREATININE 0.63 (L) 04/18/2017       Vd = 26.4 L, ke = 0.20 hr-1    Recommended Dose: 600 mg IV q24 based on previous regimens/levels     Estimated Peak: 21.8 mg/L  Estimated trough: 0.2 mg/L    Pharmacy will continue to monitor and order levels as appropriate.  Patient will be followed for changes in renal function, toxicity, and efficacy.  Please page service pharmacist with questions/clarifications.    Perry Mount,  PharmD

## 2017-04-18 NOTE — Unmapped (Signed)
Problem: Patient Care Overview  Goal: Plan of Care Review  Outcome: Progressing  Pt was admitted from ED this am. Pt is on CIWA for alcohol withdrawal. MDs in to see pt. Medicated for nausea. Pt hs received ativan once this shift. Medicated once for nausea at this time. Given tylenol for headache. On falls precaution with no falls noted. Pt blood glucose dropped to 53. MD made aware and given orange juice and sugar as well as ginger ale. Plan to recheck after 45 minutes. Continues with IV antibiotics. Pt was on oxygen for a short period because saturations dropped to 90% . Currently 95% on room air. Given fluid bolus as ordered . Continue with plan of care.      Problem: Fall Risk (Adult)  Goal: Identify Related Risk Factors and Signs and Symptoms  Related risk factors and signs and symptoms are identified upon initiation of Human Response Clinical Practice Guideline (CPG).   Outcome: Progressing      Problem: Alcohol Withdrawal Acute, Risk/Actual (Adult)  Goal: Signs and Symptoms of Listed Potential Problems Will be Absent, Minimized or Managed (Alcohol Withdrawal Acute, Risk/Actual)  Signs and symptoms of listed potential problems will be absent, minimized or managed by discharge/transition of care (reference Alcohol Withdrawal Acute, Risk/Actual (Adult) CPG).   Outcome: Progressing      Problem: Pain, Acute (Adult)  Goal: Identify Related Risk Factors and Signs and Symptoms  Related risk factors and signs and symptoms are identified upon initiation of Human Response Clinical Practice Guideline (CPG).   Outcome: Progressing

## 2017-04-18 NOTE — Unmapped (Signed)
Patient returned from X-ray  Transported by Radiology  How tranported Wheelchair  Cardiac Monitor no

## 2017-04-18 NOTE — Unmapped (Signed)
Patient rounds completed. The following patient needs were addressed:  Pain .

## 2017-04-18 NOTE — Unmapped (Addendum)
Patient rounds completed. The following patient needs were addressed:  Pain, Personal Belongings, Plan of Care, Call Bell in Reach, Bed Position Low and introduced self to pt. Pt reports that his pain is improved. Reports continued SOB. Given neb tx as ordered. Requesting more nausea meds. MD made aware. Assessment: Pt A&Ox4. Skin warm and dry. No gross dyspnea on exam. Lung sound course. Abdomen soft and non-distended but tender in epigastric region. VSS. No active emesis.

## 2017-04-18 NOTE — Unmapped (Signed)
Admit team at bedside.

## 2017-04-18 NOTE — Unmapped (Signed)
Pt given ginger ale prior to departure.

## 2017-04-19 LAB — CBC
HEMATOCRIT: 36.4 % — ABNORMAL LOW (ref 41.0–53.0)
HEMOGLOBIN: 12.2 g/dL — ABNORMAL LOW (ref 13.5–17.5)
MEAN CORPUSCULAR HEMOGLOBIN CONC: 33.6 g/dL (ref 31.0–37.0)
MEAN CORPUSCULAR HEMOGLOBIN: 30.5 pg (ref 26.0–34.0)
MEAN CORPUSCULAR VOLUME: 90.8 fL (ref 80.0–100.0)
MEAN PLATELET VOLUME: 7.8 fL (ref 7.0–10.0)
PLATELET COUNT: 64 10*9/L — ABNORMAL LOW (ref 150–440)
RED BLOOD CELL COUNT: 4.01 10*12/L — ABNORMAL LOW (ref 4.50–5.90)
RED CELL DISTRIBUTION WIDTH: 15.3 % — ABNORMAL HIGH (ref 12.0–15.0)

## 2017-04-19 LAB — MAGNESIUM: Magnesium:MCnc:Pt:Ser/Plas:Qn:: 1.3 — ABNORMAL LOW

## 2017-04-19 LAB — BASIC METABOLIC PANEL
ANION GAP: 11 mmol/L (ref 9–15)
BLOOD UREA NITROGEN: 9 mg/dL (ref 7–21)
BUN / CREAT RATIO: 15
CALCIUM: 7.9 mg/dL — ABNORMAL LOW (ref 8.5–10.2)
CHLORIDE: 97 mmol/L — ABNORMAL LOW (ref 98–107)
CO2: 26 mmol/L (ref 22.0–30.0)
CREATININE: 0.61 mg/dL — ABNORMAL LOW (ref 0.70–1.30)
EGFR MDRD AF AMER: >=60 mL/min/{1.73_m2} (ref >=60)
GLUCOSE RANDOM: 285 mg/dL — ABNORMAL HIGH (ref 65–179)
POTASSIUM: 3.6 mmol/L (ref 3.5–5.0)

## 2017-04-19 LAB — BUN / CREAT RATIO: Urea nitrogen/Creatinine:MRto:Pt:Ser/Plas:Qn:: 15

## 2017-04-19 LAB — TOBRAMYCIN RANDOM
Tobramycin:MCnc:Pt:Ser/Plas:Qn:: 13.9
Tobramycin:MCnc:Pt:Ser/Plas:Qn:: 2.2

## 2017-04-19 LAB — PHOSPHORUS
PHOSPHORUS: 3.4 mg/dL (ref 2.9–4.7)
Phosphate:MCnc:Pt:Ser/Plas:Qn:: 3.4

## 2017-04-19 LAB — HEMATOCRIT: Lab: 36.4 — ABNORMAL LOW

## 2017-04-19 NOTE — Unmapped (Signed)
Problem: Patient Care Overview  Goal: Plan of Care Review  Outcome: Progressing  Plan of care discussed with pt.  Pt stated understanding.  Pt with CIWA scores ranging from 3-12.  PRN ativan administered once.  PRN oxy administered once for abd pain.  Pt compliant with abx therapy.  Pt remains on 2L via [place] with VSS.  No c/o SOB or difficulty breathing thus far this shift.  Will continue to monitor.    Goal: Individualization and Mutuality  Outcome: Progressing    Goal: Discharge Needs Assessment  Outcome: Progressing    Goal: Interprofessional Rounds/Family Conf  Outcome: Progressing      Problem: Alcohol Withdrawal Acute, Risk/Actual (Adult)  Goal: Signs and Symptoms of Listed Potential Problems Will be Absent, Minimized or Managed (Alcohol Withdrawal Acute, Risk/Actual)  Signs and symptoms of listed potential problems will be absent, minimized or managed by discharge/transition of care (reference Alcohol Withdrawal Acute, Risk/Actual (Adult) CPG).   Outcome: Progressing   04/19/17 0255   Alcohol Withdrawal Acute, Risk/Actual (Adult)   Problems Assessed (Alcohol Withdrawal Syndrome) all   Problems Present (Alcohol W/D Syndrome) effects of AWS (alcohol withdrawal syndrome)       Problem: Pain, Acute (Adult)  Goal: Identify Related Risk Factors and Signs and Symptoms  Related risk factors and signs and symptoms are identified upon initiation of Human Response Clinical Practice Guideline (CPG).   Outcome: Progressing   04/19/17 0255   Pain, Acute (Adult)   Related Risk Factors (Acute Pain) disease process   Signs and Symptoms (Acute Pain) nausea/vomiting/anorexia;verbalization of pain descriptors     Goal: Acceptable Pain Control/Comfort Level  Patient will demonstrate the desired outcomes by discharge/transition of care.   Outcome: Progressing   04/19/17 0255   Pain, Acute (Adult)   Acceptable Pain Control/Comfort Level making progress toward outcome       Problem: Cystic Fibrosis (Adult)  Goal: Signs and Symptoms of Listed Potential Problems Will be Absent, Minimized or Managed (Cystic Fibrosis)  Signs and symptoms of listed potential problems will be absent, minimized or managed by discharge/transition of care (reference Cystic Fibrosis (Adult) CPG).  Outcome: Progressing   04/19/17 0255   Cystic Fibrosis (Adult)   Problems Assessed (Cystic Fibrosis) all   Problems Present (Cystic Fibrosis) infection;malabsorption/maldigestion;pulmonary/respiratory complications;undernutrition/vitamin and mineral deficiency       Problem: VTE, DVT and PE (Adult)  Goal: Signs and Symptoms of Listed Potential Problems Will be Absent, Minimized or Managed (VTE, DVT and PE)  Signs and symptoms of listed potential problems will be absent, minimized or managed by discharge/transition of care (reference VTE, DVT and PE (Adult) CPG).  Outcome: Progressing   04/19/17 0255   VTE, DVT and PE (Adult)   Problems Assessed (VTE, DVT, PE) all   Problems Present (VTE, DVT, PE) none

## 2017-04-19 NOTE — Unmapped (Addendum)
Cystic Fibrosis Nutrition Assessment    Inpatient: MD Consult this admission and related follow up  Primary Pulmonologist: Dr. Lurena Nida  ===================================================================  CLINICAL DATA:  Walter [name] is a 36 y.o. male seen for medical nutrition therapy. Currently admitted with alcohol withdrawal and likely CF exacerbation.  - [name] resting upon visit, did not wish to interact with RD today. Per MD notes he has been out of home enzyme regimen; vomiting 3-4 x per day; reported abd pain in setting of binge drinking (KUB 04-18-17 showed nonobstructive bowel gas pattern, moderate amount of colonic stool).    Past Medical History:   Diagnosis Date   ??? Alcohol abuse    ??? Bipolar disorder (CMS-HCC)    ??? Chronic pancreatitis (CMS-HCC)    ??? Chronic sinusitis    ??? 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 Cystic fibrosis (CMS-HCC)    ??? Diabetes mellitus (CMS-HCC)     dx in-house 2015   ??? Kidney stones    ??? Portal hypertension (CMS-HCC)      Anthroprometric Evaluation:  Current BMI: Body mass index is 21.51 kg/m??.     Weight changes: Weight continues to be down this year. Over all appears to be down 19 pounds since January 2018.  Last 5 Recorded Weights    04/18/17 0945 04/18/17 1059   Weight: 71.9 kg (158 lb 8 oz) 71.9 kg (158 lb 7.8 oz)     Wt Readings from Last 12 Encounters:   04/18/17 71.9 kg (158 lb 7.8 oz)   02/20/17 75.3 kg (166 lb)   02/10/17 75.7 kg (166 lb 12.8 oz)   12/01/16 72.6 kg (160 lb)   10/09/16 76.2 kg (167 lb 15.9 oz)   08/04/16 77.4 kg (170 lb 10.2 oz)   07/24/16 70.3 kg (155 lb)   06/19/16 80.4 kg (177 lb 4.8 oz)   05/18/16 79.4 kg (175 lb)   04/10/16 79 kg (174 lb 2.6 oz)   03/19/16 69.3 kg (152 lb 11.2 oz)   11/25/15 79.2 kg (174 lb 11.2 oz)     Ht Readings from Last 3 Encounters:   04/18/17 182.8 cm (5' 11.97)   02/20/17 185.4 cm (6' 1)   02/01/17 185.4 cm (6' 1)     Malnutrition Assessment using AND/ASPEN Clinical Characteristics:  Unable to complete NFPE today due to limited interaction from Walter [name] today.   ===================================================================  Cystic Fibrosis Nutrition Category = Acceptable   ===================================================================  Current Nutrition Orders (inpatient):  Nutrition Orders          Nutrition Therapy High Calorie High Protein starting at 11/04 1610        CF Nutrition related medications (inpatient): Nutritionally relevant medications reviewed.    Folic acid  Lispro  Zenpep 96,045 (6 at meals,   Magnesium sulfate  AquADEK vitamin 1 per day  protonix  Thiamine  Compazine PRN    CF Nutrition related labs (inpatient):    04-19-17: Glu 285, Glu-POC 282  04-18-17: Glu-POC [phone]  ==================================================================  Energy Intake (outpatient):  Diet: High in calories, fat, & salt. Unable to evaluate at this time.  Food allergies: None known  Diet and CFTR modulators: Not applicable.  PO Supplements: Hx of reporting that he likes Boost & Ensure. Has received PO supplements from [facility] in the past.  Appetite Stimulant: Per primary pulmonologist note 07-24-16 favor Remeron as was the plan from his last psychiatric admission. Hx of cyproheptadine.   Enteral feeding tube: none  Sodium in diet: Unable  to evaluate at this time.   Calcium in diet:  Unable to evaluate at this time.     Fat Malabsorption (outpatient):  Enzyme brand, (meals/snacks): Zenpep 40,000 @ 6/meal and 3/snack   Enzyme administration details: Per MD notes has been  out of enzymes at home PTA.   - Reports he has not tolerated Creon in the past due to oily, loose stools.  Enzyme dose per MEAL (units lipase/kg/meal) 3409 - based on weight of 155 pounds  Enzyme dose per DAY (units lipase/kg/day) 15340 - based on weight of 155 pounds and 3 meals/snacks daily  Stools: Unable to evaluate at this time.   Abdominal pain: Unable to evaluate at this time.  Fecal Fat Studies: no new  No results found for: ELAST  GI meds: Nutritionally relevant medications reviewed.     Vitamins/Minerals (outpatient):  CF-specific MVI, dose, compliance: MVW Complete Formulation Softgel 2 daily, from [facility] - Unable to evaluate compliance at this time.  Other vitamins/mienerals/herbals: none  Calcium supplement: yes  Patient Resources: Hx of enrollment in  [employer] (patient assistance program via Zenpep, provides CF vitamins and PO supplements free of charge).  Fat-soluble vitamin levels:   Lab Results   Component Value Date/Time    VITAMINA 22.4 (L) 03/19/2016 1529   - Low: However this level is likely falsely low as it was drawn at sick clinic visit, admitted ~ 1 week later for exacerbation/IV antibiotics.    Lab Results   Component Value Date/Time    VITDTOTAL 23.1 02/13/2017 0558    VITDTOTAL 8.8 (L) 02/02/2017 0534    VITDTOTAL 20 03/19/2016 1529   - low    Lab Results   Component Value Date/Time    VITD3 20 03/19/2016 1529     Lab Results   Component Value Date/Time    VITD2 <5 03/19/2016 1529     Lab Results   Component Value Date/Time    VITAME 4.5 (L) 03/19/2016 1529   - Low: However this level is likely falsely low as it was drawn at sick clinic visit, admitted ~1 week later for exacerbation/IV antibiotics.    Lab Results   Component Value Date/Time    PT 12.2 04/18/2017 0901    PT 11.1 10/31/2010 1115   - elevated    Bone Health: Due for DEXA. Last DEXA 2011 showed normal bone density.      CF Related Diabetes: Yes. Follows with [facility].  Lab Results   Component Value Date/Time    A1C 10.5 (H) 02/01/2017 0842    A1C 8.8 (H) 11/25/2015 1426    A1C 9.3 (H) 04/16/2014 1604   ===================================================================  Assessment:  Current diet is appropriate for CF. Patient would benefit from adjustment in enzyme regimen. Vitamin prescription is not appropriate to reach/maintain optimal fat soluble vitamin levels. Patient may benefit from vitamin K supplementation while on IV antibiotics. Agree with acid reducer. Patient to benefit from insulin regimen for glucose management. Suspect weight loss related to EtOH use and lack of enzyme regimen PTA.  ===================================================================  Goals:  1. Meet estimated daily needs: 3191 kcals while inpatient considering lower activity factor, recommend increase to 3988 kcals when outpatient considering higher activity factor; 84-112 gm pro; 2508 mL free water       Calories estimated using: Cystic Fibrosis Conference Formula, fluid per Holiday Segar, protein per DRI x 1.5-2  2. Reach/maintain established goals for CF:                Adult -  BMI 22kg/m2 for CF females and 23kg/m2 for CF males  3. Normal fat-soluble vitamin levels: Vitamin A, Vitamin E and PT per lab range; Vitamin D 25OH total >30  4. Maintain glucose control. Carbohydrate content of diet should comprise 40-50% of total calorie needs, but carbohydrates are not restricted in this population.    5.  Meet sodium needs for CF  ===================================================================  Intervention:  1. Continue High Calorie High Protein diet. PO supplements available should he request them - Ensure Plus and SuperShakes. RD to clarify his interest in PO supplements at next visit.    2. Home enzyme Zenpep 40,000 not on [facility] inpatient formulary. While inpatient can use Zenpep 20,000 but will require dose adjustment considering weight is lower than baseline. Recommend:  - Zenpep 20,000 (10 at meals, 5 at snacks) provides 2840 units lipase/kg/meal & 16109 units lipase/kg/day    3. Recommend change CF  vitamin regimen to: 2 Complete Formulation capsules daily.  - Please discontinue AquADEK chew vitamins.  - Please check vitamin D level this admit as he received D2 supplementation at previous admission (August 2018).  - Continue thiamine and folic acid as appropriate considering EtOH use.    4. PT WNL. If to start/continue IV antibiotics consider starting 5mg  phytonadione twice weekly while on IV antibiotics.    5. Please weigh weigh patient twice weekly this admit.    6. Start bowel regimen as needed - has previously been on miralax    7. Continue remainder of nutrition regimen:  - High Calorie High Protein diet  - acid reducer  - insulin regimen - as appropriate, may consider consulting Endocrinology  ===================================================================  Inpatient:   Will follow up with patient per protocol: 1-2 times per week (and more frequent as indicated)

## 2017-04-19 NOTE — Unmapped (Addendum)
Walter [name] is a 36 y.o. 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 Had recently been on a 2 week drinking binge after leaving a rehab facility.    Alcohol Withdrawal, resolved, h/o Alcohol Use D/O: He was able to be managed on the floor with CIWA protocol and PRN ativan. Main symptoms were nausea, tremors, headache, and hypertension. After a few days, ativan was weaned and some symptoms were thought to be unrelated to withdrawal. He did not hallucinate or have seizures and has no history of DTs/seizures/intubation. He was seen by nutrition and we continued folvite, MV, thiamine, Vit K 5mg  2x/wk. We gave compazine PRN nausea and though his headaches may have been secondary to caffeine withdrawal. He had a diagnosis of essential hypertension from 2004 but did not recall this, however it was thought he is likely hypertensive at baseline as he remained hypertensive for the first few days of admission. Psychiatry came to see the patient on 04/24/17 to provide resources for rehabilitation.  ??  Substance-induced Depressive Disorder: History of Bipolar I. History of SI but has not endorsed SI during this hospitalization other than passive SI. Psychiatric problems along with alcohol abuse are greatly contributing to his poor health and continued need for hospital care. Patient is also recently homeless. Psychiatry was consulted and agreed with continuing home Zyprexa and not restarting Zoloft.   - Zyprexa 15mg  QHS  - Psych Consulted  ??  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 He had not been eating the 2 weeks prior to admission and ate ravenously in the hospital. Endocrinology was consulted to help control his sugars.  --- Endocrinology consulted; recs appreciated  --- Lantus to 40 units daily   --- Lispro SSI   --- Meal time and snack carb counting Lispro (0-18 with meals per patient)  --- as outpatient, Keep with same regimen as in hospital, but if decreasing intake, decrease long-acting by 20%, only cover however much he is eating with lispro; if becoming hypoglycemic decrease by another 20%   ??  CF Pulmonary Exacerbation:??RVP, RSV, flu negative. Repeat PFTs improved from prior w/ FEV1 of 1.75L (37.8% predicted) from previous 1.24L (26% predicted) on 8/30.  - IV Tobramycin (11/4-), Cefepime (11/5-)  - duonebs, hypertonic 3%, Chest vest per RT  - CF Culture with mPsA, f/u sensitivities  ??  Hypertension: new dx  - Continue amlodipine 5mg  daily  ??  Thrombocytopenia, improving: Most likely worsened by alcohol use (possible bone marrow suppression), typically ~150-200 even with baseline cirrhosis, improving, now up to 114.   - restarted lovenox  ??  Abdominal Pain, diarrhea, improving: in setting of binge drinking, chronic pancreatitis, & possible gastritis 2/2 alcohol. 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 Stools more solid with less abdominal pain. Has improved with PO intake.  - daily Miralax (Encouraged compliance)  - PRN tylenol   - Zenpep  ??  Hx of severe protein-calorie malnutrition, Vitamin D Deficiency: Binge drinking last two weeks before admission, not eating much at all. Will monitor for refeeding. Regular diet as tolerated.  - nutrition following, appreciate recs  - folvite, MV, thiamine, Vit K 5mg  2x/wk  - BMP, Mg, Phos daily, consider BID if needing aggressive repletion  - Vit D level low at 9.5, on multivit  ??  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 ??  H/o Opiate Use D/O: CTM    Outpatient Follow Up  - Endocrine Recs:  --- For sugars as outpatient, keep with same regimen as in hospital if possible, but if decreasing intake, decrease long-acting by 20%, only cover however much he is eating with lispro; if becoming hypoglycemic decrease by another 20%    - Psychiatry recs:  --- Naltrexone outpatient per psychiatry, will follow with outpatient psychiatry in [facility]  --- While the patient is not currently receiving outpatient mental health care, we recommended to them that they establish care by contacting either their insurance carrier or their local Managed Care Organization, [employer] [phone]).   --- Upon discharge patient would likely benefit from finding a therapist; patient can find information about therapists in the area using www.psychologytoday.com, which can be provided in a discharge summary.        Katie  S: Walter [name] is a 36 y.o. male with CF, DM2, chronic pancreatitis, cirrhosis, alcohol abuse, HTN presents for dyspnea on exertion and alcohol withdrawal episode.  I: Stable    T: ??**Avoid ativan and oxy if possible  ???? ?? ??**Low threshold for CT abd/pelvis for abd pain + clinical deterioration   ???? ?? ** Nurse might ask for extra correctional insulin dose overnight, fine to give    P: IV tobra and cefepime, psych recs   _______________________________  #EtOH withdrawal, use d/o: psych rec naltrexone outpatient  #CF exacerbation: Tobra/cefepime   #Thrombocytopenia, resolved  #Abd pain: zenpep, IVF as needed, bowel regimen  #CFrDM: incr lantus 40u, self- mngmt 0-18U w/ food, SSI Endocrine c/s   #Psych: Zyprexa, d/c zoloft. Naltrexone as an outpt. Pt requested to speak to CF social work

## 2017-04-19 NOTE — Unmapped (Signed)
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 *      Walter [name] is a 36 y.o. male with PMHx as reviewed in the EMR that presented to [facility] with Alcohol withdrawal (CMS-HCC).    Alcohol Withdrawal w/ h/o Alcohol Use D/O: left rehab center 2 weeks ago and has been binge drinking since then, 4-6 24Oz beers since then a day, last drink was this morning at 1 am, anxious and tremulous in the ED. Vomiting 3-4x/day  Has been drinking heavily since he was a freshman in high school, longest time sober was 9 mo in 2016 after he was in rehab for 6 months. No history of DTs, seizures, or hallucinations. Alcohol <10.  - CIWA protocol  - folvite, aquadeks, thiamine  - compazine PRN nausea  - protonix    CF Exacerbation: last exacerbation 02/01/17-02/14/17, treated with IV Tobramycin and Ceftazadime. Had mPsA at that time, susceptible to aztreonam, cefepime, imipenem, meropenem, and tobramycin; intermediate to ceftazidime; resistant to cipro, levo. increased dyspnea at baseline and with exertion, passed out walking up stairs yesterday and had episode of incontinence. Increased cough per patient, non-productive for the most part but has noticed some streaks of sputum. Only uses his vest ~1x/week, otherwise does hypertonic saline 7%, albuterol,  No fevers. RSP/Flu negative, WBC wnl.  - started IV Tobramycin and Ceftazadime (note had intermediate sensitivity to this 01/2017, but responded to treatment, low threshold to switch to cefepime if not improving).  - duonebs  - hypertonic 3%, monitor for hemoptysis  - f/u CF culture, RVP    Abdominal Pain: in setting of binge drinking, may be gastritis related to alcohol use vs chronic pancreatitis (lipase <10). Consider other causes if not improving, but patient says usually this pain gets better when he eats more regularly, attributes to chronic pancreatitis.  - KUB wnl  - PRN tylenol and oxycodone    AGMA with concomitant NAGMA: no blood gas, but anion gap of 16 with bicarb of 23 (albumin 3.2, expected gap of 8). History of DM with recent uncontrolled sugars and has not been eating well, U/A w/ >1000 glucose, but ketones negative, BHB negative. Well-perfused on exam with stable vital signs less likely lactic acidosis. Denies ingestion ethylene glycol/methanol. Delta AG/Delta HCO3 of 8/1, concomitant nonanion gap acidosis likely explained by his diarrhea over the last couple weeks. S/p 2L fluids.  - IVMF overnight  - CTM labs    DM 2/2 CF: sugars in 200s on admission, but dropped to 50 after regular SSI  - sensitive sliding scale  - hold home lantus 20U     Malnutrition: Binge drinking last two weeks, not eating much at all. Will monitor for refeeding  - nutrition consult  - folvite, aquadeks, thiamine  - BMP, Mg, Phos daily, consider BID if needing aggressive repletion    H/o Chronic Pancreatitis: Abdominal Pain, h/o Chronic Pancreatitis: setting of CF and Alcohol use D/O, has been binge drinking, not eating, hasn't been able to afford Zenpep recently but just got insurance to cover for $20 instead of $8000.  - Zenpep    Cirrhosis 2/2 CF: also likely worsened  with alcohol abuse, LFTs wnl other than mildly elevated alk phos    Mood Disorder:   - continue home zyprexa 15mg  QHS    H/o Opiate Use D/O: hasn't used for a couple of years, only used heroin for 5 days, Utox positive for opiates but sample collected after morphine given in ED    Diet: Regular  DVT PPx: lovenox  GI PPx: protonix  Code Status: FULL  Dispo: Med G  _________________________________________________________________    Chief Complaint:  Chief Complaint   Patient presents with   ??? Difficulty Breathing Alcohol withdrawal (CMS-HCC)    HPI:  Walter [name] is a 36 y.o. 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 He reports that she weeks ago he was in a rehabilitation facility for alcohol abuse, but he left and started binge drinking. He has not been eating well since then has had about 4-6 24 ounce beers every day. He has had greasy diarrhea as well and has not been able to afford his Zenpep for the last month (was $8000, just got insurance to make it $20). He is also had vomiting does worsen over the last 2 weeks, now occurring about 3-4 times a day with no blood. The last few days he has had worsening abdominal pain which he thinks is his chronic pancreatitis. He has also had a few days of worsening cough with some blood-streaked sputum and worsening dyspnea. 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 On arrival today he reports he is anxious and feels very sick. He reports that his last drink was at 1 AM this morning. He has had withdrawal episodes before, but has never had delirium tremens, seizures, hallucinations, or need to be intubated. He did spend 6 months in rehabilitation in 2016 and was sober for 9 months, but has otherwise been drinking heavily since his freshman year in high school. He denies any fever, muscle aches, and joint pains.    In terms of his CF, he uses his vest about once a week and uses albuterol and hypertonic saline 7%. His last exacerbation was at [facility] from 02/01/17 to 02/14/17 where he was treated with IV tobramycin and ceftazadime. Had mPsA at that time, susceptible to aztreonam, cefepime, imipenem, meropenem, and tobramycin; intermediate to ceftazidime; resistant to cipro, levofloxacin. His outpatient doctor is Dr. Lurena Nida who he has not seen recently.    During the last 2 weeks he notes that his sugars have been in the 300s. He has continued to take Lantus 25 units daily and also has NovoLog sliding scale at home. Of note he reports that his father passed away earlier this year which has made it very difficult to quit drinking.    Allergies:  Quetiapine    Medications:   Prior to Admission medications    Medication Dose, Route, Frequency   ibuprofen (ADVIL,MOTRIN) 200 MG tablet 800 mg, Oral, Daily (standard)   insulin ASPART (NOVOLOG FLEXPEN) 100 unit/mL injection pen 8-12 Units, Subcutaneous, 3 times a day (AC)   insulin glargine (LANTUS) 100 unit/mL injection 20 Units, Subcutaneous, Daily (standard)   OLANZapine (ZYPREXA) 15 MG tablet 15 mg, Oral, Nightly   pantoprazole (PROTONIX) 40 MG tablet 40 mg, Oral, 2 times a day  albuterol (PROVENTIL HFA;VENTOLIN HFA) 90 mcg/actuation inhaler 2 puffs, Inhalation, Every 6 hours PRN   albuterol 2.5 mg /3 mL (0.083 %) nebulizer solution 2.5 mg, Nebulization, 4 times daily (RT)   calcium carbonate (OS-CAL) 600 mg calcium (1,500 mg) tablet 600 mg of elem calcium, Oral, Daily (standard)   folic acid (FOLVITE) 1 MG tablet 1 mg, Oral, Daily (standard)   gabapentin (NEURONTIN) 400 MG capsule 400 mg, Oral, 3 times a day (standard)   ketoconazole (NIZORAL) 2 % cream 1 application, Topical, Daily (standard)   lipase-protease-amylase (ZENPEP) 40,000-126,000- 168,000 unit CpDR 6 capsules, Oral, 3 times a day (standard), 3 capsules with snacks three times a day   MVW Complete, pediatric multivit 61-D3-vit K, 1,500-800 unit-mcg cap 2 capsules, Oral, Daily (standard)   sertraline (ZOLOFT) 100 MG tablet 200 mg, Oral, Daily (standard)   sodium chloride 7% 7 % Nebu 4 mL, Nebulization, 4 times daily   thiamine (B-1) 100 MG tablet 100 mg, Oral, Daily (standard)       Medical History:  Past Medical History:   Diagnosis Date   ??? Alcohol abuse    ??? Bipolar disorder (CMS-HCC)    ??? Chronic pancreatitis (CMS-HCC)    ??? Chronic sinusitis    ??? 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 Cystic fibrosis (CMS-HCC)    ??? Diabetes mellitus (CMS-HCC)     dx in-house 2015   ??? Kidney stones    ??? Portal hypertension (CMS-HCC)        Surgical History:  Past Surgical History:   Procedure Laterality Date   ??? CHOLECYSTECTOMY  2008   ??? sinus surgery     ??? TONSILLECTOMY         Social History:  Social History     Social History   ??? Marital status: Single     Spouse name: N/A   ??? Number of children: 0   ??? Years of education: 12+     Occupational History   ??? Not on file.     Social History Main Topics   ??? Smoking status: Former Smoker     Types: Cigarettes   ??? Smokeless tobacco: Never Used   ??? Alcohol use 11.4 oz/week     16 Cans of beer, 3 Glasses of wine per week   ??? Drug use: Yes     Types: Marijuana      Comment: hx of alcohol and prescription pain medication abuse   ??? Sexual activity: Not on file     Other Topics Concern   ??? 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 Hillard Danker with [facility] in [place]    Current therapist: Nehemiah Settle with CF team  SUBSTANCE ABUSE HX:     # MJ     -Current use: once in awhile    # Alcohol     -Current use: Was sober from 2016 until two weeks ago. Admits to drinking enough to get drunk and admits to passing out. Denies history of complicated withdrawal. Admits to withdrawal symptoms of tremors, anxiety, headache, and tactile hallucinations at times.     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 Diabetes Maternal Grandmother    ??? Diabetes Maternal Grandfather    ??? No Known Problems Mother    ??? Cancer Father    ??? No Known Problems Sister        Review of Systems:  10 systems reviewed and are negative unless otherwise mentioned in HPI    Labs/Studies:  Labs and Studies from the last 24hrs per EMR and Reviewed    Physical Exam:    Vitals:    04/18/17 1800   BP: 130/82   Pulse: 89   Resp: 20   Temp: 37.1 ??C   SpO2: 93%     General: curled in bed, anxious, mild tremor  HEENT: atraumatic, normocephalic, EOMI  Cardiac: mildly tachycardic, normal S1 and S2, no m/r/g  Pulmonary: CTAB, on [place] 2L in ED but no excess work of breathing  Abdomen: soft, diffusely tender, no rebound, normoactive bowel sounds  Extremities: warm extremities, 2+ DP and radial pulses  Neuro: moving all four extremities, alert, following commands  Skin: no rashes or lesions noted    Shirlean Kelly  Internal Medicine PGY-1  Pager: [phone]

## 2017-04-19 NOTE — Unmapped (Signed)
Care Management  Initial Transition Planning Assessment  Per H & P: Walter [name] is a 36 y.o. male with PMHx as reviewed in the EMR that presented to [facility] with Alcohol withdrawal (CMS-HCC).. Last CF exac was 02/14/17                  General  Orientation Level: Oriented X4    Contact/Decision Maker:    Contact Details  Contact Details: Primary Contact  Primary Contact Name: Debbie Glandon  Primary Contact Relationship: Mother  Phone #1: [phone]    Advance Directive (Medical Treatment)  Does patient have an advance directive covering medical treatment?: Patient would not like information.  Surrogate decision maker appointed:: No  Reason there is not a surrogate decision maker appointed:: Patient does not wish to appoint a surrogate decision maker at this time  Information provided on advance directive:: No  Patient requests assistance:: No    Advance Directive (Mental Health Treatment)  Does patient have an advance directive covering mental health treatment?: Patient does not have advance directive covering mental health treatment.  Reason patient does not have an advance directive covering mental health treatment:: Patient does not wish to complete one at this time.    Patient Information:    Lives with: Friends    Type of Residence: Private residence    Type of Residence: Mailing Address:  [address] [place] [address]  Contacts: Accompanied by: Alone  Contact Details: Primary Contact  Primary Contact Name: Feliberto Harts  Primary Contact Relationship: Mother  Phone #1: [phone]  Patient Phone Number: [phone] (home)         Medical Provider(s): Dellis Filbert, MD  Reason for Admission: Admitting Diagnosis:  ETOH abuse [F10.10]  Cystic fibrosis exacerbation (CMS-HCC) [E84.9]  Past Medical History:   has a past medical history of Alcohol abuse; Bipolar disorder (CMS-HCC); Chronic pancreatitis (CMS-HCC); Chronic sinusitis; 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  Past Surgical History:   has a past surgical history that includes Tonsillectomy; sinus surgery; and Cholecystectomy (2008).   Previous admit date: 02/01/2017    Primary Insurance- Payor: BCBS / Plan: BCBS BLUE OPTIONS/PPO/ADV / Product Type: *No Product type* /   Secondary Insurance ??? None  Prescription Coverage ???   Preferred Pharmacy - [id number] - [place], [place] - [address]  [facility] - [place] - [address]  [id number] - [place], [place] - [address] AT [address]    Transportation home: Private vehicle  Level of function prior to admission: Independent     Location/Detail: [address] [place]  [address]   3 stories & 2 roommates    Support Systems: Family Members, Parent    Responsibilities/Dependents at home?: No    Home Care services in place prior to admission?: No     Equipment Currently Used at Home: respiratory supplies  Current HME Agency (Name/Phone #): also has vest and nebulizer    Currently receiving outpatient dialysis?: No     Financial Information:     Patient source of income: works/family    Need for financial assistance?: No     Discharge Needs Assessment:    Concerns to be Addressed: no discharge needs identified    Clinical Risk Factors: Multiple Diagnoses (Chronic), Other (  Comment) (Etoh withdrawal)    Barriers to taking medications: No    Prior overnight hospital stay or ED visit in last 90 days: Yes    Readmission Within the Last 30 Days: no previous admission in last 30 days    Anticipated Changes Related to Illness: none    Equipment Needed After Discharge: none    Discharge Facility/Level of Care Needs:  (home to self care)    Patient at risk for readmission?: Yes    Discharge Plan:    Screen findings are: Care Manager reviewed the plan of the patient's care with the Multidisciplinary Team. No discharge planning needs identified at this time. Care Manager will continue to manage plan and monitor patient's progress with the team.    Expected Discharge Date: 04/21/17    Expected Transfer from Critical Care:      Patient and/or family were provided with choice of facilities / services that are available and appropriate to meet post hospital care needs?: N/A     Initial Assessment complete?: Yes

## 2017-04-19 NOTE — Unmapped (Addendum)
Pt with CIWA scores at 12 - PRN Ativan. Gave PRN Oxycodone administered for abdominal pain. Scheduled Medications given per MD orders. Contact Precautions Maintained. Patient rested comfortably. Will continue to monitor.         Problem: Patient Care Overview  Goal: Plan of Care Review  Outcome: Progressing    Goal: Individualization and Mutuality  Outcome: Progressing    Goal: Discharge Needs Assessment  Outcome: Progressing    Goal: Interprofessional Rounds/Family Conf  Outcome: Progressing      Problem: Alcohol Withdrawal Acute, Risk/Actual (Adult)  Goal: Signs and Symptoms of Listed Potential Problems Will be Absent, Minimized or Managed (Alcohol Withdrawal Acute, Risk/Actual)  Signs and symptoms of listed potential problems will be absent, minimized or managed by discharge/transition of care (reference Alcohol Withdrawal Acute, Risk/Actual (Adult) CPG).   Outcome: Progressing      Problem: Pain, Acute (Adult)  Goal: Identify Related Risk Factors and Signs and Symptoms  Related risk factors and signs and symptoms are identified upon initiation of Human Response Clinical Practice Guideline (CPG).   Outcome: Progressing    Goal: Acceptable Pain Control/Comfort Level  Patient will demonstrate the desired outcomes by discharge/transition of care.   Outcome: Progressing      Problem: Cystic Fibrosis (Adult)  Goal: Signs and Symptoms of Listed Potential Problems Will be Absent, Minimized or Managed (Cystic Fibrosis)  Signs and symptoms of listed potential problems will be absent, minimized or managed by discharge/transition of care (reference Cystic Fibrosis (Adult) CPG).   Outcome: Progressing      Problem: VTE, DVT and PE (Adult)  Goal: Signs and Symptoms of Listed Potential Problems Will be Absent, Minimized or Managed (VTE, DVT and PE)  Signs and symptoms of listed potential problems will be absent, minimized or managed by discharge/transition of care (reference VTE, DVT and PE (Adult) CPG).   Outcome: Progressing

## 2017-04-20 LAB — BASIC METABOLIC PANEL
ANION GAP: 8 mmol/L — ABNORMAL LOW (ref 9–15)
ANION GAP: 10 mmol/L (ref 9–15)
BLOOD UREA NITROGEN: 12 mg/dL (ref 7–21)
BLOOD UREA NITROGEN: 12 mg/dL (ref 7–21)
BUN / CREAT RATIO: 15
BUN / CREAT RATIO: 17
CALCIUM: 7.8 mg/dL — ABNORMAL LOW (ref 8.5–10.2)
CALCIUM: 7.7 mg/dL — ABNORMAL LOW (ref 8.5–10.2)
CHLORIDE: 95 mmol/L — ABNORMAL LOW (ref 98–107)
CO2: 31 mmol/L — ABNORMAL HIGH (ref 22.0–30.0)
CREATININE: 0.71 mg/dL (ref 0.70–1.30)
EGFR MDRD AF AMER: >=60 mL/min/{1.73_m2} (ref >=60)
EGFR MDRD AF AMER: >=60 mL/min/{1.73_m2} (ref >=60)
EGFR MDRD NON AF AMER: >=60 mL/min/{1.73_m2} (ref >=60)
EGFR MDRD NON AF AMER: >=60 mL/min/{1.73_m2} (ref >=60)
GLUCOSE RANDOM: 416 mg/dL (ref 65–179)
GLUCOSE RANDOM: 746 mg/dL (ref 65–179)
POTASSIUM: 4.7 mmol/L (ref 3.5–5.0)
POTASSIUM: 4.7 mmol/L (ref 3.5–5.0)
SODIUM: 132 mmol/L — ABNORMAL LOW (ref 135–145)
SODIUM: 131 mmol/L — ABNORMAL LOW (ref 135–145)

## 2017-04-20 LAB — CBC
HEMATOCRIT: 34 % — ABNORMAL LOW (ref 41.0–53.0)
HEMOGLOBIN: 11.9 g/dL — ABNORMAL LOW (ref 13.5–17.5)
MEAN CORPUSCULAR HEMOGLOBIN CONC: 35 g/dL (ref 31.0–37.0)
MEAN CORPUSCULAR HEMOGLOBIN: 31.9 pg (ref 26.0–34.0)
MEAN CORPUSCULAR VOLUME: 91.1 fL (ref 80.0–100.0)
MEAN PLATELET VOLUME: 7.8 fL (ref 7.0–10.0)
PLATELET COUNT: 55 10*9/L — ABNORMAL LOW (ref 150–440)
RED BLOOD CELL COUNT: 3.73 10*12/L — ABNORMAL LOW (ref 4.50–5.90)
RED CELL DISTRIBUTION WIDTH: 15.1 % — ABNORMAL HIGH (ref 12.0–15.0)

## 2017-04-20 LAB — PHOSPHORUS: Phosphate:MCnc:Pt:Ser/Plas:Qn:: 4.3

## 2017-04-20 LAB — MAGNESIUM: Magnesium:MCnc:Pt:Ser/Plas:Qn:: 1.5 — ABNORMAL LOW

## 2017-04-20 LAB — MEAN PLATELET VOLUME: Lab: 7.8

## 2017-04-20 LAB — CALCIUM: Calcium:MCnc:Pt:Ser/Plas:Qn:: 7.7 — ABNORMAL LOW

## 2017-04-20 LAB — SMEAR - MD REQUEST

## 2017-04-20 LAB — SODIUM: Sodium:SCnc:Pt:Ser/Plas:Qn:: 132 — ABNORMAL LOW

## 2017-04-20 NOTE — Unmapped (Addendum)
Gave PRN Oxycodone 2x's for abdominal pain. Scheduled BG was over 600 - gave 15 units of insulin per MD orders and NS Bolus. Pt reported being anxious - notified team and gave PRN PO Ativan  Medications given per MD orders. Contact Precautions Maintained. Patient rested comfortably. Will continue to monitor.       Problem: Patient Care Overview  Goal: Individualization and Mutuality  Outcome: Progressing    Goal: Discharge Needs Assessment  Outcome: Progressing    Goal: Interprofessional Rounds/Family Conf  Outcome: Progressing      Problem: Alcohol Withdrawal Acute, Risk/Actual (Adult)  Goal: Signs and Symptoms of Listed Potential Problems Will be Absent, Minimized or Managed (Alcohol Withdrawal Acute, Risk/Actual)  Signs and symptoms of listed potential problems will be absent, minimized or managed by discharge/transition of care (reference Alcohol Withdrawal Acute, Risk/Actual (Adult) CPG).   Outcome: Progressing      Problem: Pain, Acute (Adult)  Goal: Identify Related Risk Factors and Signs and Symptoms  Related risk factors and signs and symptoms are identified upon initiation of Human Response Clinical Practice Guideline (CPG).   Outcome: Progressing    Goal: Acceptable Pain Control/Comfort Level  Patient will demonstrate the desired outcomes by discharge/transition of care.   Outcome: Progressing      Problem: Cystic Fibrosis (Adult)  Goal: Signs and Symptoms of Listed Potential Problems Will be Absent, Minimized or Managed (Cystic Fibrosis)  Signs and symptoms of listed potential problems will be absent, minimized or managed by discharge/transition of care (reference Cystic Fibrosis (Adult) CPG).   Outcome: Progressing      Problem: VTE, DVT and PE (Adult)  Goal: Signs and Symptoms of Listed Potential Problems Will be Absent, Minimized or Managed (VTE, DVT and PE)  Signs and symptoms of listed potential problems will be absent, minimized or managed by discharge/transition of care (reference VTE, DVT and PE (Adult) CPG).   Outcome: Progressing

## 2017-04-20 NOTE — Unmapped (Signed)
Pharmacy Note: Tobramycin Dosing / Monitoring    Walter [name] 36 y.o. male    Admission: 04/18/2017  ??  ??  Admitted with elevated glucoses -- w/u for CFRD  Has hx of chronic pancreatitis, ETOH abuse. CFLD    Patient Parameters  HT: 5'11   WT: 71.9kg (75.3kg, 02/20/17 used on adm)  Bun/scr: 6/0.63  K/Mg 4.2 / 1.5      CF-HiDose tobramycin regimen  (1h infusion)  Goal Peak (at end of infusion):  20-46mcg/mL  Goal Post-Infusion12h random:  ~1-81mcg/mL  Goal Trough (b/f dose): <16mcg/mL    Abx/duration:  Day 1 = 11/4   Day 14 till 11/18    Cefepime 2g q8h  Tobramycin - CF highdose.  ??  Reported Labs   11/5:  Bun/scr;   K/Mg 3.6/1.3    Tobra levels:   11/5 = 13.34mcg/mL (peak/C1) at 1212 drawn ~ 1.25h after infusion ends   11/5 = 2.2 mg/mL (random/C2) draw ~ 5.57 h after C1    Using these values -- calculated ke ~ 0.321 T1/2 2.2h    Estimated peak at end of infusion ~ 48mcg/mL  Trough < 0.6    Peak value at high end of goal range, patient clearing drug appropriately at this time, however given patients hx of pancreatitis, CFLD and currently uncontrolled blood sugars, recommend a slightly lower peak.  ??  Recommend at this unless clinically indicated otherwise:  1. Continue :  Tobra 600 mg q24h (1h infusion)  2. Recheck tobra concentrations in 3-4 days, sooner if renal fx  Or clinical status changes  3. Monitor chem-10 at least 3-4x/week while inpatient.  4. Avoid NSAIDS and inhaled TOBI while on IV tobramycin      Pharmacy will continue to follow along with medical team    Beonca Gibb, PharmD  MDG Team Pharmacist for April 19, 2017 11:26 PM

## 2017-04-20 NOTE — Unmapped (Signed)
Cystic Fibrosis Nutrition Assessment    Inpatient: MD Consult this admission and related follow up  Primary Pulmonologist: Dr. Lurena Nida  ===================================================================  CLINICAL DATA:  Walter [name] is a 36 y.o. male seen for medical nutrition therapy. Currently admitted with alcohol withdrawal and likely CF exacerbation. Per MD notes he has been out of home enzyme regimen PTA; vomiting 3-4 x per day; reported abd pain in setting of binge drinking (KUB 04-18-17 showed nonobstructive bowel gas pattern, moderate amount of colonic stool).  Walter [name] awake this morning upon RD visit. Requests chocolate Ensure Plus with meals. Feels stools are slowly improving with resuming enzyme regimen.    Past Medical History:   Diagnosis Date   ??? Alcohol abuse    ??? Bipolar disorder (CMS-HCC)    ??? Chronic pancreatitis (CMS-HCC)    ??? Chronic sinusitis    ??? 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 Cystic fibrosis (CMS-HCC)    ??? Diabetes mellitus (CMS-HCC)     dx in-house 2015   ??? Kidney stones    ??? Portal hypertension (CMS-HCC)      Anthroprometric Evaluation:  Current BMI: Body mass index is 21.51 kg/m??.     Weight changes: Weight continues to be down this year. Overall appears to be down 19 pounds since January 2018.  Last 5 Recorded Weights    04/18/17 0945 04/18/17 1059   Weight: 71.9 kg (158 lb 8 oz) 71.9 kg (158 lb 7.8 oz)     Wt Readings from Last 12 Encounters:   04/18/17 71.9 kg (158 lb 7.8 oz)   02/20/17 75.3 kg (166 lb)   02/10/17 75.7 kg (166 lb 12.8 oz)   12/01/16 72.6 kg (160 lb)   10/09/16 76.2 kg (167 lb 15.9 oz)   08/04/16 77.4 kg (170 lb 10.2 oz)   07/24/16 70.3 kg (155 lb)   06/19/16 80.4 kg (177 lb 4.8 oz)   05/18/16 79.4 kg (175 lb)   04/10/16 79 kg (174 lb 2.6 oz)   03/19/16 69.3 kg (152 lb 11.2 oz)   11/25/15 79.2 kg (174 lb 11.2 oz)     Ht Readings from Last 3 Encounters:   04/18/17 182.8 cm (5' 11.97)   02/20/17 185.4 cm (6' 1)   02/01/17 185.4 cm (6' 1) Malnutrition Assessment using AND/ASPEN Clinical Characteristics:  Patient deferred NFPE. Unable to determine malnutrition at this time. Note + weight loss and decreased appetite PTA, however he did not disclose timeframe of decreased appetite.  ===================================================================  Cystic Fibrosis Nutrition Category = At Risk 2/2 weight loss   ===================================================================  Current Nutrition Orders (inpatient):  Nutrition Orders          Nutrition Therapy High Calorie High Protein (high calorie High protein) starting at 11/05 2133        CF Nutrition related medications (inpatient): Nutritionally relevant medications reviewed.    Folic acid  lantus  Lispro  Zenpep 20,000 (10 at meals, 5 at meals)  Magnesium sulfate  2 MVW Complete Formulation softgels daily  protonix  5 mg vitamin K twice weekly  Thiamine  Compazine PRN    CF Nutrition related labs (inpatient):    04-20-17: Glu 416, Glu-POC 189-457  04-19-17: Glu 285, Glu-POC [phone]  04-18-17: Glu-POC [phone]  ==================================================================  Energy Intake (outpatient):  Diet: High in calories, fat, & salt. Reports decreased appetite PTA, however he did not disclose timeframe of decreased appetite.  Food allergies: None known  Diet and CFTR modulators: Not applicable.  PO Supplements: [employer]. Has received PO supplements from [employer] in the past.  Appetite Stimulant: Per primary pulmonologist note 07-24-16 favor Remeron as was the plan from his last psychiatric admission. Hx of cyproheptadine.   Enteral feeding tube: none  Sodium in diet: Difficult to evaluate as Walter [name] did not wish to discuss details of PO intake PTA   Calcium in diet:  Difficult to evaluate as Walter [name] did not wish to discuss details of PO intake PTA     Fat Malabsorption (outpatient):  Enzyme brand, (meals/snacks): Zenpep 40,000 @ 6/meal and 3/snack   Enzyme administration details: Per MD notes has been  out of enzymes at home PTA.   - Reports he has not tolerated Creon in the past due to oily, loose stools.  Enzyme dose per MEAL (units lipase/kg/meal) 3409 - based on weight of 155 pounds  Enzyme dose per DAY (units lipase/kg/day) 15340 - based on weight of 155 pounds and 3 meals/snacks daily  Stools: Loose PTA. Reports some improvement in stools since restarting enzyme regimen.   Abdominal pain: Yes  Fecal Fat Studies: no new  No results found for: ELAST  GI meds: Nutritionally relevant medications reviewed.     Vitamins/Minerals (outpatient):  CF-specific MVI, dose, compliance: MVW Complete Formulation Softgel 2 daily - compliance unclear  Other vitamins/mienerals/herbals: none  Calcium supplement: yes  Patient Resources: Hx of enrollment in  [employer] (patient assistance program via Zenpep, provides CF vitamins and PO supplements free of charge).  Fat-soluble vitamin levels:   Lab Results   Component Value Date/Time    VITAMINA 22.4 (L) 03/19/2016 1529   - Low: However this level is likely falsely low as it was drawn at sick clinic visit, admitted ~ 1 week later for exacerbation/IV antibiotics.    Lab Results   Component Value Date/Time    VITDTOTAL 23.1 02/13/2017 0558    VITDTOTAL 8.8 (L) 02/02/2017 0534    VITDTOTAL 20 03/19/2016 1529   - low    Lab Results   Component Value Date/Time    VITD3 20 03/19/2016 1529     Lab Results   Component Value Date/Time    VITD2 <5 03/19/2016 1529     Lab Results   Component Value Date/Time    VITAME 4.5 (L) 03/19/2016 1529   - Low: However this level is likely falsely low as it was drawn at sick clinic visit, admitted ~1 week later for exacerbation/IV antibiotics.    Lab Results   Component Value Date/Time    PT 12.2 04/18/2017 0901    PT 11.1 10/31/2010 1115   - elevated    Bone Health: Due for DEXA. Last DEXA 2011 showed normal bone density.      CF Related Diabetes: Yes. Follows with [facility].  Lab Results Component Value Date/Time    A1C 10.5 (H) 02/01/2017 0842    A1C 8.8 (H) 11/25/2015 1426    A1C 9.3 (H) 04/16/2014 1604   ===================================================================  Assessment:  Current diet is appropriate for CF. Patient would benefit from PO supplement for additional calories. Enzyme dose is within established guidelines. Vitamin prescription is appropriate to reach/maintain optimal fat soluble vitamin levels. Patient may benefit from vitamin K supplementation while on IV antibiotics. Agree with acid reducer. Patient to benefit from insulin regimen for glucose management. Likely that weight loss related to EtOH use and lack of enzyme regimen PTA.  ===================================================================  Goals:  1. Meet estimated daily needs: 3191 kcals while inpatient considering lower activity factor,  recommend increase to 3988 kcals when outpatient considering higher activity factor; 84-112 gm pro; 2508 mL free water       Calories estimated using: Cystic Fibrosis Conference Formula, fluid per Holiday Segar, protein per DRI x 1.5-2  2. Reach/maintain established goals for CF:                Adult - BMI 22kg/m2 for CF females and 23kg/m2 for CF males  3. Normal fat-soluble vitamin levels: Vitamin A, Vitamin E and PT per lab range; Vitamin D 25OH total >30  4. Maintain glucose control. Carbohydrate content of diet should comprise 40-50% of total calorie needs, but carbohydrates are not restricted in this population.    5.  Meet sodium needs for CF  ===================================================================  Intervention:  1. Continue High Calorie High Protein diet. Send Ensure Plus (chocolate) TID with meals.    2. Home enzyme Zenpep 40,000 not on [facility] inpatient formulary. While inpatient can use Zenpep 20,000. Recommend: Zenpep 20,000 (10 at meals, 5 at snacks) provides 2840 units lipase/kg/meal & 16109 units lipase/kg/day    3. Continue CF  vitamin regimen to: 2 Complete Formulation capsules daily.  - Please check vitamin D level this admit as he received D2 supplementation at previous admission (August 2018).  - Continue thiamine and folic acid as appropriate considering EtOH use.    4. PT WNL. Continue 5mg  phytonadione twice weekly while on IV antibiotics.    5. Please weigh weigh patient twice weekly this admit.    6. Start bowel regimen as needed - has previously been on miralax    7. Continue remainder of nutrition regimen:  - High Calorie High Protein diet  - acid reducer  - insulin regimen - as appropriate, may consider consulting Endocrinology  ===================================================================  Inpatient:   Will follow up with patient per protocol: 1-2 times per week (and more frequent as indicated)

## 2017-04-20 NOTE — Unmapped (Signed)
Problem: Patient Care Overview  Goal: Plan of Care Review  Outcome: Progressing  Patient with observed hemoptysis. Scores 13-14 on CIWA-Ar. Given prn 2mg  IV Ativan and oxycodone 5mg  q4H. Scores primarily for nausea, vomiting, and anxiety. One episode emesis. Patient is calm and cooperative. New PIV placed following infiltration of previous site. IV antibiotic therapy continues without complications. Medications administered as ordered per MAR. Patient has remained free from falls and injury this shift. Continuing with plan of care.  Goal: Individualization and Mutuality  Outcome: Progressing    Goal: Discharge Needs Assessment  Outcome: Progressing    Goal: Interprofessional Rounds/Family Conf  Outcome: Progressing      Problem: Alcohol Withdrawal Acute, Risk/Actual (Adult)  Goal: Signs and Symptoms of Listed Potential Problems Will be Absent, Minimized or Managed (Alcohol Withdrawal Acute, Risk/Actual)  Signs and symptoms of listed potential problems will be absent, minimized or managed by discharge/transition of care (reference Alcohol Withdrawal Acute, Risk/Actual (Adult) CPG).   Outcome: Progressing      Problem: Pain, Acute (Adult)  Goal: Identify Related Risk Factors and Signs and Symptoms  Related risk factors and signs and symptoms are identified upon initiation of Human Response Clinical Practice Guideline (CPG).   Outcome: Progressing    Goal: Acceptable Pain Control/Comfort Level  Patient will demonstrate the desired outcomes by discharge/transition of care.   Outcome: Progressing      Problem: Cystic Fibrosis (Adult)  Goal: Signs and Symptoms of Listed Potential Problems Will be Absent, Minimized or Managed (Cystic Fibrosis)  Signs and symptoms of listed potential problems will be absent, minimized or managed by discharge/transition of care (reference Cystic Fibrosis (Adult) CPG).   Outcome: Progressing      Problem: VTE, DVT and PE (Adult)  Goal: Signs and Symptoms of Listed Potential Problems Will be Absent, Minimized or Managed (VTE, DVT and PE)  Signs and symptoms of listed potential problems will be absent, minimized or managed by discharge/transition of care (reference VTE, DVT and PE (Adult) CPG).   Outcome: Progressing

## 2017-04-20 NOTE — Unmapped (Signed)
Received a voicemail from patient's mother Walter [name]. She states that Walter [name] is an alcoholic, addict, and has significant psychological challenges. Asking for a psychiatric evaluation while he is in the hospital. She is also fearful that he will end up on the streets.     Paged Med G intern about the possibility of a psych evaluation or possible admission to the psych hospital at [facility].     Returned call to Walter [name]. She was appreciative of the return call. Feels like she is in serious panic mode. States that Walter [name] has been non-compliant with his CF care and is generally worried about his wellbeing. States it was worse after Walter [name] died unexpectedly of an MI during/following treatment for cancer. States Walter [name] and his [name] were very close, but wasn't connected when his [name] passed.     Living situation: Walter [name] has been/will be kicked out of his apartment in November by his roommates.. She is unable to allow Walter [name] to live with her. Worried that he will be out of the streets.     Insurance: States he will have an increase in premium January first. Walter [name] has asked Walter [name] to apply for disability and emergency medicaid, which she says hasn't happened.     Walter [name] does have the support of family and mentioned other resources for her own care. She was appreciative of the call and I will follow-up once I hear from the inpatient team.      Walter [name]. Gentry Fitz, RN  CF Nurse Coordinator   [phone]

## 2017-04-20 NOTE — Unmapped (Signed)
Patient refused resp. Treatments and airway clearance.

## 2017-04-20 NOTE — Unmapped (Signed)
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 *           Walter [name] is a 36 y.o. male with PMHx as reviewed in the EMR that presented to [facility] with Alcohol withdrawal (CMS-HCC).  ??  Alcohol Withdrawal w/ h/o Alcohol Use D/O: CIWA protocol, required Ativan 2mg  4x overnight, ainly scoring for nausea, vomiting last night but not this morning, tremor, anxiety, headache, and agitation. No hallucinations reported.   - CIWA protocol  - folvite, MV, thiamine, Vit K 5mg  2x/wk  - compazine PRN nausea  - protonix  ??  CF Exacerbation: afebrile, WBC ct wnl, on room air, refused treatment at noon today, will inquire as to why.  - IV Tobramycin (11/4-), started Cefepime (11/5-), discontinued Ceftazidime (11/4, intermediate sensitivity 01/2017)  - duonebs  - hypertonic 3%  - Chest vest per RT  - f/u CF culture, RVP  ??  Abdominal Pain: in setting of binge drinking, may be gastritis related to alcohol use vs chronic pancreatitis (lipase <10). Still having pain today. KUB wnl on 11/4.  - PRN tylenol and oxycodone    Diarrhea: 3-4x/day, similar to before admission, hasn't had Zenpep, likely pancreatic insufficiency but will CTM as he now has enzymes and consider other etiology if continues/worsens.  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  Malnutrition and dehydration: Binge drinking last two weeks before admission, not eating much at all. Will monitor for refeeding. Regular diet as tolerated  - nutrition following, appreciate recs  - folvite, MV, thiamine, Vit K 5mg  2x/wk  - BMP, Mg, Phos daily, consider BID if needing aggressive repletion  - Vit D level  - consider adding PPI or H2 blocker    Thrombocytopenia: does have hx of cirrhosis, 150-200 at baseline, CTM  - stop lovenox if below 50  ??  H/o Chronic Pancreatitis  - Zenpep  ????  AGMA with concomitant NAGMA, resolving: AG 11 today, will CTM  - 1L NS  - CTM labs    Cirrhosis 2/2 CF: also likely worsened with alcohol abuse, LFTs wnl other than mildly elevated alk phos  ??  Mood Disorder:   - continue home zyprexa 15mg  QHS  ??  H/o Opiate Use D/O: hasn't used for a couple of years, only used heroin for 5 days, Utox positive for opiates but sample collected after morphine given in ED  ??  Diet: Regular  DVT PPx: lovenox  GI PPx: protonix  Code Status: FULL  Dispo: Med G  ___________________________________________________________________    Subjective:  No acute events overnight. Required Ativan 2mg  x4 overnight per CIWA protocol, mainly scoring for nausea, vomiting last night but not this morning, tremor, anxiety, headache, and agitation. No hallucinations reported. He is still having abdominal pain and diarrhea about 3-4 times a day, consistent with the amount before admission.    Labs/Studies:  Labs and Studies from the last 24hrs per  EMR and Reviewed    Physical Exam:  Vitals:    04/19/17 1635   BP:    Pulse: 92   Resp: 20   Temp:    SpO2: 94%     General: laying in bed, tired but arousable to verbal stimuli.   HEENT: atraumatic, normocephalic, EOMI  Cardiac: RRR, normal S1 and S2  Pulmonary: some crackles at R base but otherwise CTA  Abdomen: soft, ND, mildly tender  Extremities: warm, 2+ DP and radial pulses    Shirlean Kelly  Internal Medicine PGY-1  Pager: [phone]

## 2017-04-20 NOTE — Unmapped (Signed)
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 *           Walter [name] is a 36 y.o. male with PMHx as reviewed in the EMR that presented to [facility] with Alcohol withdrawal (CMS-HCC).  ??  Alcohol Withdrawal w/ h/o Alcohol Use D/O: No prior history of complicated withdrawal. Despite patient receiving IV ativan q4h for CIWA scores >12, suspect symptoms to be multifactorial (i.e. Abdominal pain/Nausea due to constipation & chronic pancreatitis, Headache due to caffeine-withdrawal)    - CIWA protocol; d/c IV ativan  - folvite, MV, thiamine, Vit K 5mg  2x/wk  - Encouraged use of compazine PRN nausea  ??  CF Exacerbation: Continues to encounter issues with noncompliance w/ treatments & airway clearance.  - IV Tobramycin (11/4-), Cefepime (11/5-), discontinued Ceftazidime (11/4, intermediate sensitivity 01/2017)  - duonebs  - hypertonic 3%   - Chest vest per RT  - f/u CF culture, RVP  ??  Thrombocytopenia: Although patient carries diagnosis of cirrhosis, platelets are typically >150s. Differential includes splenic sequestration secondary to portal hypertension, marrow suppression from chronic alcohol use, vs heparin-induced thrombocytopenia. Despite elevated creatinine, low suspicion for TTP or HUS given clinical context.   - Will review smear  - LDH & Haptoglobin on AM labs  - Discontinue lovenox     Abdominal Pain, diarrhea: in setting of binge drinking, may be gastritis related to alcohol use vs chronic pancreatitis (lipase <10). KUB on admission showed moderate colonic stool burden, raising suspicion for constipation driving clinical symptoms. 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   - PRN tylenol and oxycodone  - Zenpep  - Start bowel regimen     CFrDM: sugars in 200s on admission, but dropped to 50 after regular SSI, monitor intake  - sensitive sliding scale  - Start lantus 10 units daily with improved PO intake  ??  Malnutrition and dehydration: Binge drinking last two weeks before admission, not eating much at all. Will monitor for refeeding. 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 ??  Mood Disorder: Home zyprexa 15mg  QHS  H/o Opiate Use D/O: CTM  ??  Diet: Regular  DVT PPx: Discontinue lovenox (Thrombocytopenia)  GI PPx: protonix  Code Status: FULL  Dispo: Med G  ___________________________________________________________________    Subjective:  No acute events overnight. Patient continued to receive oxycodone & ativan q4h PRN around the clock. Improved PO intake with occasional episodes of nausea & vomiting, however had not received anti-emetics.       Labs/Studies:  Labs and Studies from the last 24hrs per EMR and Reviewed    Physical Exam:  Vitals:    04/20/17 0959   BP: 122/76   Pulse: 94   Resp: 18   Temp: 36.7 ??C   SpO2: 91%     General:  laying in bed, improved somnolence, disheveled appearing   HEENT: atraumatic, normocephalic  Cardiac: RRR, normal S1 and S2  Pulmonary: some crackles at R base but otherwise CTA  Abdomen: soft, ND, mildly tender, normoactive bowel sounds   Extremities: warm, 2+ DP and radial pulses

## 2017-04-21 LAB — BASIC METABOLIC PANEL
ANION GAP: 13 mmol/L (ref 9–15)
ANION GAP: 10 mmol/L (ref 9–15)
BLOOD UREA NITROGEN: 11 mg/dL (ref 7–21)
BLOOD UREA NITROGEN: 10 mg/dL (ref 7–21)
BUN / CREAT RATIO: 14
BUN / CREAT RATIO: 14
CALCIUM: 8.4 mg/dL — ABNORMAL LOW (ref 8.5–10.2)
CALCIUM: 8.2 mg/dL — ABNORMAL LOW (ref 8.5–10.2)
CHLORIDE: 91 mmol/L — ABNORMAL LOW (ref 98–107)
CO2: 30 mmol/L (ref 22.0–30.0)
CREATININE: 0.76 mg/dL (ref 0.70–1.30)
CREATININE: 0.71 mg/dL (ref 0.70–1.30)
EGFR MDRD AF AMER: >=60 mL/min/{1.73_m2} (ref >=60)
EGFR MDRD AF AMER: >=60 mL/min/{1.73_m2} (ref >=60)
EGFR MDRD NON AF AMER: >=60 mL/min/{1.73_m2} (ref >=60)
GLUCOSE RANDOM: 595 mg/dL (ref 65–179)
GLUCOSE RANDOM: 191 mg/dL — ABNORMAL HIGH (ref 65–179)
POTASSIUM: 4.6 mmol/L (ref 3.5–5.0)
POTASSIUM: 4 mmol/L (ref 3.5–5.0)
SODIUM: 138 mmol/L (ref 135–145)

## 2017-04-21 LAB — PHOSPHORUS: Phosphate:MCnc:Pt:Ser/Plas:Qn:: 4.8 — ABNORMAL HIGH

## 2017-04-21 LAB — CBC
HEMATOCRIT: 36.2 % — ABNORMAL LOW (ref 41.0–53.0)
HEMOGLOBIN: 12.2 g/dL — ABNORMAL LOW (ref 13.5–17.5)
MEAN CORPUSCULAR HEMOGLOBIN CONC: 33.6 g/dL (ref 31.0–37.0)
MEAN CORPUSCULAR VOLUME: 94.2 fL (ref 80.0–100.0)
MEAN PLATELET VOLUME: 8.1 fL (ref 7.0–10.0)
PLATELET COUNT: 61 10*9/L — ABNORMAL LOW (ref 150–440)
RED BLOOD CELL COUNT: 3.85 10*12/L — ABNORMAL LOW (ref 4.50–5.90)
RED CELL DISTRIBUTION WIDTH: 15.4 % — ABNORMAL HIGH (ref 12.0–15.0)
WBC ADJUSTED: 5.3 10*9/L (ref 4.5–11.0)

## 2017-04-21 LAB — VITAMIN D, TOTAL (25OH): Lab: 9.5 — ABNORMAL LOW

## 2017-04-21 LAB — HEMATOCRIT: Lab: 36.2 — ABNORMAL LOW

## 2017-04-21 LAB — PROTIME-INR: INR: 0.94

## 2017-04-21 LAB — HEPARIN CORRELATION: Lab: <0.2

## 2017-04-21 LAB — POTASSIUM: Potassium:SCnc:Pt:Ser/Plas:Qn:: 4

## 2017-04-21 LAB — PROTIME: Lab: 10.7

## 2017-04-21 LAB — CO2: Carbon dioxide:SCnc:Pt:Ser/Plas:Qn:: 30

## 2017-04-21 LAB — HAPTOGLOBIN: Haptoglobin:MCnc:Pt:Ser/Plas:Qn:: 106

## 2017-04-21 LAB — MAGNESIUM: Magnesium:MCnc:Pt:Ser/Plas:Qn:: 1.7

## 2017-04-21 LAB — LACTATE DEHYDROGENASE: Lactate dehydrogenase:CCnc:Pt:Ser/Plas:Qn:: 351

## 2017-04-21 LAB — APTT: HEPARIN CORRELATION: <0.2

## 2017-04-21 NOTE — Unmapped (Signed)
Patient tolerated scheduled treatments but refused airway clearance.  Will continue to monitor.

## 2017-04-21 NOTE — Unmapped (Signed)
Patient was compliant with inhaled medications, but refused to do airway clearance.

## 2017-04-21 NOTE — Unmapped (Signed)
Problem: Patient Care Overview  Goal: Plan of Care Review  Outcome: Progressing  Pt is AxOx4, pt hypertensive this AM, team aware. CIWA protocol remains in place. Per MD, goal is to attempt to treat symptoms of withdrawal and reduce the frequency of Ativan. Last CIWA score of 9, pt given appropriate PRNs per orders. Pt appears to be resting comfortably in bed with current score and VSS. Blood sugars elevated this AM. Insulin given per order. No s/sx of dvt/pe noted, pt ambulates independently. [facility] and provide necessary interventions.     Problem: Alcohol Withdrawal Acute, Risk/Actual (Adult)  Goal: Signs and Symptoms of Listed Potential Problems Will be Absent, Minimized or Managed (Alcohol Withdrawal Acute, Risk/Actual)  Signs and symptoms of listed potential problems will be absent, minimized or managed by discharge/transition of care (reference Alcohol Withdrawal Acute, Risk/Actual (Adult) CPG).   Outcome: Progressing      Problem: Pain, Acute (Adult)  Goal: Identify Related Risk Factors and Signs and Symptoms  Related risk factors and signs and symptoms are identified upon initiation of Human Response Clinical Practice Guideline (CPG).   Outcome: Progressing      Problem: Cystic Fibrosis (Adult)  Goal: Signs and Symptoms of Listed Potential Problems Will be Absent, Minimized or Managed (Cystic Fibrosis)  Signs and symptoms of listed potential problems will be absent, minimized or managed by discharge/transition of care (reference Cystic Fibrosis (Adult) CPG).   Outcome: Progressing      Problem: VTE, DVT and PE (Adult)  Goal: Signs and Symptoms of Listed Potential Problems Will be Absent, Minimized or Managed (VTE, DVT and PE)  Signs and symptoms of listed potential problems will be absent, minimized or managed by discharge/transition of care (reference VTE, DVT and PE (Adult) CPG).   Outcome: Progressing

## 2017-04-21 NOTE — Unmapped (Signed)
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 *           Walter [name] is a 36 y.o. male with PMHx as reviewed in the EMR that presented to [facility] with Alcohol withdrawal (CMS-HCC).  ??  Alcohol Withdrawal w/ h/o Alcohol Use D/O: No prior history of complicated withdrawal. Despite patient receiving IV ativan q4h for CIWA scores >12, suspect symptoms to be multifactorial (i.e. Abdominal pain/Nausea due to constipation & chronic pancreatitis, Headache due to caffeine-withdrawal). Overall feeling better, improving tremor, no hallucinations.  - CIWA protocol; ativan 2mg  IV q12hr PRN  - folvite, MV, thiamine, Vit K 5mg  2x/wk  - Encouraged use of compazine PRN nausea    CFrDM: sugars in 200s on admission, now rising as high as 700 yesterday. AG 13 today.   - s/p 2L NS (one yest, one this AM)  - increased to home lantus of 20U daily  - regular sliding scale  - f/u PM BMP to eval for any increase in AG    CF Exacerbation: Continues to encounter issues with noncompliance w/ treatments & airway clearance. RVP negative.  - IV Tobramycin (11/4-), Cefepime (11/5-), discontinued Ceftazidime (11/4, intermediate sensitivity 01/2017)  - duonebs  - hypertonic 3%   - Chest vest per RT  - f/u CF culture  ??  Thrombocytopenia: Although patient carries diagnosis of cirrhosis, platelets are typically >150s. Differential includes splenic sequestration secondary to portal hypertension, marrow suppression from chronic alcohol use, vs heparin-induced thrombocytopenia. Despite elevated creatinine, low suspicion for TTP or HUS given clinical context especially as smear did not reveal schistocytes and LDH/haptoglobin were wnl, Hgb has been stable. Stable today ~60.  - f/u INR, PTT  - hold lovenox     Abdominal Pain, diarrhea, improving: in setting of binge drinking, may be gastritis related to alcohol use vs chronic pancreatitis (lipase <10). KUB on admission showed moderate colonic stool burden, raising suspicion for constipation driving clinical symptoms. 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 Improving as patient can tolerate more PO.  - PRN tylenol and oxycodone  - Zenpep  - Start bowel regimen     Malnutrition and dehydration: Binge drinking last two weeks before admission, not eating much at all. Will monitor for refeeding. 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 ??  Mood Disorder: Home zyprexa 15mg  QHS  H/o Opiate Use D/O: CTM  ??  Diet: Regular  DVT PPx: Discontinue lovenox (Thrombocytopenia)  GI PPx: protonix  Code Status: FULL  Dispo: Med G  ___________________________________________________________________    Subjective:  Feeling better overall with no acute events overnight. He says his nausea has improved, his stools are becoming more solid, he is tolerating PO, and his pain has decreased. He does note that he has been urinating more. No other new complaints.  Labs/Studies:  Labs and Studies from the last 24hrs per EMR and Reviewed    Physical Exam:  Vitals:    04/21/17 1403   BP: 151/96   Pulse: 82   Resp: 18   Temp: 36.7 ??C   SpO2: 95%     General: sitting up in bed, disheveled  HEENT: atraumatic, normocephalic  Cardiac: RRR, normal S1 and S2  Pulmonary: CTAB  Abdomen: soft, ND, still mildly tender mainly in epigastrium, normoactive bowel sounds   Extremities: warm, 2+ DP and radial pulses

## 2017-04-21 NOTE — Unmapped (Signed)
Problem: Patient Care Overview  Goal: Plan of Care Review  Outcome: Progressing  Patient continues to score 13-15 on CIWA-Ar screen. PO Ativan given q8h and oxycodone given q4h upon request. Patient endorsing headache and pain in upper gastric region. Alert, oriented, denies SOB.  IV antibiotic therapy continues without complications. Medications administered as ordered per MAR. Patient has remained free from falls and injury this shift. Continuing with plan of care.  Goal: Individualization and Mutuality  Outcome: Progressing    Goal: Discharge Needs Assessment  Outcome: Progressing    Goal: Interprofessional Rounds/Family Conf  Outcome: Progressing      Problem: Alcohol Withdrawal Acute, Risk/Actual (Adult)  Goal: Signs and Symptoms of Listed Potential Problems Will be Absent, Minimized or Managed (Alcohol Withdrawal Acute, Risk/Actual)  Signs and symptoms of listed potential problems will be absent, minimized or managed by discharge/transition of care (reference Alcohol Withdrawal Acute, Risk/Actual (Adult) CPG).   Outcome: Progressing      Problem: Pain, Acute (Adult)  Goal: Identify Related Risk Factors and Signs and Symptoms  Related risk factors and signs and symptoms are identified upon initiation of Human Response Clinical Practice Guideline (CPG).   Outcome: Progressing    Goal: Acceptable Pain Control/Comfort Level  Patient will demonstrate the desired outcomes by discharge/transition of care.   Outcome: Progressing      Problem: Cystic Fibrosis (Adult)  Goal: Signs and Symptoms of Listed Potential Problems Will be Absent, Minimized or Managed (Cystic Fibrosis)  Signs and symptoms of listed potential problems will be absent, minimized or managed by discharge/transition of care (reference Cystic Fibrosis (Adult) CPG).   Outcome: Progressing      Problem: VTE, DVT and PE (Adult)  Goal: Signs and Symptoms of Listed Potential Problems Will be Absent, Minimized or Managed (VTE, DVT and PE)  Signs and symptoms of listed potential problems will be absent, minimized or managed by discharge/transition of care (reference VTE, DVT and PE (Adult) CPG).   Outcome: Progressing

## 2017-04-21 NOTE — Unmapped (Signed)
Problem: Cystic Fibrosis (Adult)  Goal: Signs and Symptoms of Listed Potential Problems Will be Absent, Minimized or Managed (Cystic Fibrosis)  Signs and symptoms of listed potential problems will be absent, minimized or managed by discharge/transition of care (reference Cystic Fibrosis (Adult) CPG).   Outcome: Progressing  Pt has been asleep all day and has refused all medications.

## 2017-04-22 LAB — PHOSPHORUS
PHOSPHORUS: 5.2 mg/dL — ABNORMAL HIGH (ref 2.9–4.7)
Phosphate:MCnc:Pt:Ser/Plas:Qn:: 5.2 — ABNORMAL HIGH

## 2017-04-22 LAB — BASIC METABOLIC PANEL
ANION GAP: 9 mmol/L (ref 9–15)
BUN / CREAT RATIO: 22
CALCIUM: 8.7 mg/dL (ref 8.5–10.2)
CHLORIDE: 90 mmol/L — ABNORMAL LOW (ref 98–107)
CO2: 31 mmol/L — ABNORMAL HIGH (ref 22.0–30.0)
CREATININE: 0.67 mg/dL — ABNORMAL LOW (ref 0.70–1.30)
EGFR MDRD AF AMER: >=60 mL/min/{1.73_m2} (ref >=60)
EGFR MDRD NON AF AMER: >=60 mL/min/{1.73_m2} (ref >=60)
GLUCOSE RANDOM: 466 mg/dL (ref 65–179)
POTASSIUM: 4.4 mmol/L (ref 3.5–5.0)
SODIUM: 130 mmol/L — ABNORMAL LOW (ref 135–145)

## 2017-04-22 LAB — POTASSIUM: Potassium:SCnc:Pt:Ser/Plas:Qn:: 4.4

## 2017-04-22 LAB — CBC
HEMATOCRIT: 36.4 % — ABNORMAL LOW (ref 41.0–53.0)
HEMOGLOBIN: 12.4 g/dL — ABNORMAL LOW (ref 13.5–17.5)
MEAN CORPUSCULAR HEMOGLOBIN CONC: 34 g/dL (ref 31.0–37.0)
MEAN CORPUSCULAR HEMOGLOBIN: 31.5 pg (ref 26.0–34.0)
MEAN CORPUSCULAR VOLUME: 92.9 fL (ref 80.0–100.0)
MEAN PLATELET VOLUME: 7.7 fL (ref 7.0–10.0)
PLATELET COUNT: 84 10*9/L — ABNORMAL LOW (ref 150–440)
RED BLOOD CELL COUNT: 3.92 10*12/L — ABNORMAL LOW (ref 4.50–5.90)
WBC ADJUSTED: 5.7 10*9/L (ref 4.5–11.0)

## 2017-04-22 LAB — MEAN PLATELET VOLUME: Lab: 7.7

## 2017-04-22 LAB — MAGNESIUM: Magnesium:MCnc:Pt:Ser/Plas:Qn:: 1.4 — ABNORMAL LOW

## 2017-04-22 NOTE — Unmapped (Signed)
Problem: Cystic Fibrosis (Adult)  Goal: Signs and Symptoms of Listed Potential Problems Will be Absent, Minimized or Managed (Cystic Fibrosis)  Signs and symptoms of listed potential problems will be absent, minimized or managed by discharge/transition of care (reference Cystic Fibrosis (Adult) CPG).   Patient refused all treatments today.

## 2017-04-22 NOTE — Unmapped (Signed)
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 *           Walter [name] is a 36 y.o. male with PMHx as reviewed in the EMR that presented to [facility] with Alcohol withdrawal (CMS-HCC).  ??  Alcohol Withdrawal, resolved, h/o Alcohol Use D/O: Continues to feel better, tremors have improved HDS (other than likely essential HTN).  - discontinued CIWA protocol and PRN ativan  - folvite, MV, thiamine, Vit K 5mg  2x/wk  - compazine PRN nausea    CFrDM: sugars continue to be poorly controlled despite starting home insulin.  - consulted Endocrinology  --- increased Lantus to 24U from 20U daily  --- Lispro SSI  --- Meal time and snack carb counting insulin    CF Exacerbation: Continues to encounter issues with noncompliance w/ treatments & airway clearance. RVP negative.  - IV Tobramycin (11/4-), Cefepime (11/5-)  - PFTs in AM to determine severity of exacerbation, need to continue antibiotics  - duonebs, hypertonic 3%, Chest vest per RT  - f/u CF culture    Hypertension: BP in the 140s-150s systolic with diastolics occasionally in the 100s. Also had diagnosis of essential HTN previously in chart from 2004 but patient does not recall this.  - starting amlodipine 5mg  daily    Thrombocytopenia, improving: Pt does have history of cirrhosis, but plt typically >150. Differential includes splenic sequestration secondary to portal hypertension, marrow suppression from chronic alcohol use, vs less likely heparin-induced thrombocytopenia. Smear did not reveal schistocytes and LDH/haptoglobin, INR, and PTT were wnl, Hgb has been stable. Improved today 84.  - hold lovenox     Abdominal Pain, diarrhea, improving: in setting of binge drinking and chronic pancreatitis. May also have constipation with overflow as KUB on admission showed moderate colonic stool burden. 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 Continues to improve, stools now more solid  - daily Miralax  - PRN tylenol   - Zenpep    Malnutrition with Vitamin D Deficiency: Binge drinking last two weeks before admission, not eating much at all. Will monitor for refeeding. 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 ??  Mood Disorder: Home zyprexa 15mg  QHS  H/o Opiate Use D/O: CTM  ??  Diet: Regular  DVT PPx: Discontinue lovenox (Thrombocytopenia)  GI PPx: protonix  Code Status: FULL  Dispo: Med G  ___________________________________________________________________    Subjective:  He reports that overall his symptoms have improved. He still has abdominal discomfort that has improved some since admission but is still present. Otherwise, he has not vomited in 2 days and has been eating well. His stools are more formed.    Labs/Studies:  Labs and Studies from the last 24hrs per EMR and Reviewed    Physical  Exam:  Vitals:    04/22/17 1715   BP: 138/94   Pulse:    Resp:    Temp:    SpO2:      General: sitting up in bed, disheveled  HEENT: atraumatic, normocephalic, EOMI  Cardiac: RRR, normal S1 and S2  Pulmonary: CTAB, no accessory muscle use or excess work of breathing  Abdomen: soft, ND, still with diffuse tenderness, mainly in epigastrium  Extremities: warm, 2+ DP and radial pulses

## 2017-04-22 NOTE — Unmapped (Signed)
Problem: Patient Care Overview  Goal: Plan of Care Review  Outcome: Progressing  Client has requested pain medication and has been given according to schedule. Client has has several sugary drinks with elevated blood glucose levels this shift. Client has been up to use bathroom by self. Will continue to monitor.     Fuller Canada, student nurse    I have read the above note and agree with its contents. Patient updated on change to pain medication regimen. Oxycodone now every 8 hours (was every 4). Patient also notified that Ativan had been discontinued. Endocrine saw patient and insulin self management orders added. Patient was able to receive sliding scale + mealtime coverage with lunch. Patient with cough productive of thick green sputum. No dyspnea noted. Patient ambulating independently in room.     Joellen Jersey RN  Goal: Individualization and Mutuality  Outcome: Progressing    Goal: Interprofessional Rounds/Family Conf  Outcome: Progressing      Problem: Alcohol Withdrawal Acute, Risk/Actual (Adult)  Goal: Signs and Symptoms of Listed Potential Problems Will be Absent, Minimized or Managed (Alcohol Withdrawal Acute, Risk/Actual)  Signs and symptoms of listed potential problems will be absent, minimized or managed by discharge/transition of care (reference Alcohol Withdrawal Acute, Risk/Actual (Adult) CPG).   Outcome: Progressing      Problem: Pain, Acute (Adult)  Goal: Identify Related Risk Factors and Signs and Symptoms  Related risk factors and signs and symptoms are identified upon initiation of Human Response Clinical Practice Guideline (CPG).   Outcome: Progressing    Goal: Acceptable Pain Control/Comfort Level  Patient will demonstrate the desired outcomes by discharge/transition of care.   Outcome: Progressing      Problem: VTE, DVT and PE (Adult)  Goal: Signs and Symptoms of Listed Potential Problems Will be Absent, Minimized or Managed (VTE, DVT and PE)  Signs and symptoms of listed potential problems will be absent, minimized or managed by discharge/transition of care (reference VTE, DVT and PE (Adult) CPG).   Outcome: Progressing

## 2017-04-23 DIAGNOSIS — F1010 Alcohol abuse, uncomplicated: Principal | ICD-10-CM

## 2017-04-23 LAB — BASIC METABOLIC PANEL
ANION GAP: 11 mmol/L (ref 9–15)
BLOOD UREA NITROGEN: 16 mg/dL (ref 7–21)
BUN / CREAT RATIO: 24
CALCIUM: 9.1 mg/dL (ref 8.5–10.2)
CHLORIDE: 92 mmol/L — ABNORMAL LOW (ref 98–107)
CO2: 28 mmol/L (ref 22.0–30.0)
CREATININE: 0.68 mg/dL — ABNORMAL LOW (ref 0.70–1.30)
EGFR MDRD AF AMER: >=60 mL/min/{1.73_m2} (ref >=60)
EGFR MDRD NON AF AMER: >=60 mL/min/{1.73_m2} (ref >=60)
POTASSIUM: 4.7 mmol/L (ref 3.5–5.0)
SODIUM: 131 mmol/L — ABNORMAL LOW (ref 135–145)

## 2017-04-23 LAB — CBC
HEMATOCRIT: 39.6 % — ABNORMAL LOW (ref 41.0–53.0)
MEAN CORPUSCULAR HEMOGLOBIN CONC: 33.6 g/dL (ref 31.0–37.0)
MEAN CORPUSCULAR HEMOGLOBIN: 31.7 pg (ref 26.0–34.0)
MEAN CORPUSCULAR VOLUME: 94.2 fL (ref 80.0–100.0)
PLATELET COUNT: 79 10*9/L — ABNORMAL LOW (ref 150–440)
RED BLOOD CELL COUNT: 4.21 10*12/L — ABNORMAL LOW (ref 4.50–5.90)
RED CELL DISTRIBUTION WIDTH: 15.4 % — ABNORMAL HIGH (ref 12.0–15.0)
WBC ADJUSTED: 5.7 10*9/L (ref 4.5–11.0)

## 2017-04-23 LAB — TOBRAMYCIN RANDOM
Tobramycin:MCnc:Pt:Ser/Plas:Qn:: 1.7
Tobramycin:MCnc:Pt:Ser/Plas:Qn:: 18.8

## 2017-04-23 LAB — MAGNESIUM: Magnesium:MCnc:Pt:Ser/Plas:Qn:: 1.7

## 2017-04-23 LAB — PHOSPHORUS: Phosphate:MCnc:Pt:Ser/Plas:Qn:: 4.9 — ABNORMAL HIGH

## 2017-04-23 LAB — CHLORIDE: Chloride:SCnc:Pt:Ser/Plas:Qn:: 92 — ABNORMAL LOW

## 2017-04-23 LAB — MEAN CORPUSCULAR VOLUME: Lab: 94.2

## 2017-04-23 NOTE — Unmapped (Signed)
Received a call from Jefm Petty, nurse case manager with [facility] stating patient was admitted with Alcohol dependence and withdrawal.  Per Efraim Kaufmann, this is a FYI only message that they are trying to enroll patient in their case management services to try to prevent frequent readmissions.  [facility] LPN

## 2017-04-23 NOTE — Unmapped (Signed)
Problem: Patient Care Overview  Goal: Plan of Care Review  Outcome: Progressing  Patient complained abdominal pain. Patient received Oxycodone and Tylenol x1 which helped some. Patient's bedtime blood sugar was 366. Patient denied s/s of hyperglycemia. He received correctional insulin as ordered. Patient tolerated IV antibiotics as ordered without any adverse reaction. VSS, afebrile and O2sat 95% on RA no respiratory distress noted. Will continue to monitor.   04/23/17 0255   OTHER   Plan of Care Reviewed With patient   Plan of Care Review   Progress improving   .

## 2017-04-23 NOTE — Unmapped (Signed)
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 *           Walter [name] is a 36 y.o. male with PMHx as reviewed in the EMR that presented to [facility] with Alcohol withdrawal (CMS-HCC).  ??  Alcohol Withdrawal, resolved, h/o Alcohol Use D/O: Continues to feel better, tremors have improved HDS (other than likely essential HTN).  - folvite, MV, thiamine, Vit K 5mg  2x/wk  - compazine PRN nausea  - Discussing possible substance use counseling/rehab after discharge    CFrDM: sugars continue to be poorly controlled despite starting home insulin.  --- Endocrinology consulted; recs appreciated  --- increased Lantus to 28 units daily   --- Lispro SSI  --- Meal time and snack carb counting insulin    CF Pulmonary Exacerbation: Continues to encounter issues with noncompliance w/ treatments & airway clearance. RVP negative. Will assess PFTs  - IV Tobramycin (11/4-), Cefepime (11/5-)  - Repeat PFTs today  - duonebs, hypertonic 3%, Chest vest per RT  - f/u CF culture    Hypertension: BP in the 140s-150s systolic with diastolics occasionally in the 100s. Also had diagnosis of essential HTN previously in chart from 2004 but patient does not recall this.  - Continue amlodipine 5mg  daily    Thrombocytopenia: Pt does have history of cirrhosis, but plt typically >150. Differential includes splenic sequestration secondary to portal hypertension, marrow suppression from chronic alcohol use, vs less likely heparin-induced thrombocytopenia. Smear did not reveal schistocytes and LDH/haptoglobin, INR, and PTT were wnl, Hgb has been stable.   - hold lovenox   - Will discuss with Dr. Lurena Nida re: indication for repeat abdominal imaging.     Abdominal Pain, diarrhea, improving: in setting of binge drinking, chronic pancreatitis, & possible gastritis 2/2 alcohol. May also have constipation with overflow as KUB on admission showed moderate colonic stool burden. 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 Continues to improve, stools now more solid  - daily Miralax (Encouraged compliance)  - PRN tylenol   - Zenpep    Hx of severe protein-calorie malnutrition, Vitamin D Deficiency: Binge drinking last two weeks before admission, not eating much at all. Will monitor for refeeding. 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 ??  Mood Disorder: Home zyprexa 15mg  QHS  H/o Opiate Use D/O: CTM  ??  Diet: Regular  DVT PPx: Holding 2/2 thrombocytopenia  GI PPx: protonix  Code Status: FULL  Dispo: Med G  ___________________________________________________________________    Subjective:  No acute events overnight. Endorses continued abdominal discomfort & nausea. No bleeding, melena, hematochezia. Feels breathing has improved without crackles or wheezing.     Labs/Studies:  Labs and Studies from the last 24hrs per EMR and Reviewed    Physical  Exam:  Vitals:    04/23/17 0533   BP: 143/96   Pulse: 81   Resp: 18   Temp: 37.8 ??C   SpO2: 93%     General: sitting up in bed, disheveled  HEENT: atraumatic, normocephalic, EOMI  Cardiac: RRR, normal S1 and S2  Pulmonary: CTAB, no accessory muscle use or excess work of breathing  Abdomen: soft, ND, persistent mild epigastric tenderness   Extremities: warm, 2+ DP and radial pulses

## 2017-04-23 NOTE — Unmapped (Signed)
Problem: Cystic Fibrosis (Adult)  Goal: Signs and Symptoms of Listed Potential Problems Will be Absent, Minimized or Managed (Cystic Fibrosis)  Signs and symptoms of listed potential problems will be absent, minimized or managed by discharge/transition of care (reference Cystic Fibrosis (Adult) CPG).   Patient refused some  treatments today.

## 2017-04-23 NOTE — Unmapped (Signed)
Endocrinology Consult Note   Requesting Attending Physician : Nyra Jabs, MD  Service Requesting Consult : Pulmonology (MDG)  Primary Care Provider: Dellis Filbert, MD  Outpatient Endocrinologist: Jackquline Denmark, MD (last outpatient visit 11/2015)    IMPRESSION:  Walter [name] is a 36 y.o. male admitted for Alcohol withdrawal (CMS-HCC). I have been asked to evaluate Walter [name] for hyperglycmia. We are consulted for hyperglycemia in CFRD    RECOMMENDATIONS:  1. 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  -- increase glargine to 28u QAM tomorrow.  Give 4u glargine now.    --  self managmenet for lispro mealtime up to 15u depending on meal size.  Insulin ordered AC + 2 x daily PRN  -- continue SSI ACHS.  -- Educated patient on glucose control and lung function in CF, patient was not aware.      2. Nutrition:  Variable po intake complicating overall glycemic control.     I have communicated recs with primary team.    Patient seen and discussed with Dr. Tiburcio Pea    HPI:  Walter Suttonwith h/o CF c/b CFRD, alcohol abuse, bipolar disorder admitted for Cystic fibrosis exacerbation (RAF-HCC). I have been asked to evaluate Walter Suttonfor CFRD management. At home he is on Lantus 25u qAm and Novolog 4-8u premeal (does not carb count, estimates based on what he's eating and the size of the meal; adds correction but again estimates). Reports checking glucose 6x daily at home.  Did not f/u with endocrine after last admission.  He has lost 3-5 pounds recently      Glucose 106-461 yesterday  Patient ate pizza overnight without insulin coverage.  He received correction this morning and is going to wait until recheck of glucose to eat.        ROS: Per HPI, otherwise remaining of 10 systems negative.    ??? albuterol  2.5 mg Nebulization 4x Daily (RT)   ??? amLODIPine  5 mg Oral Daily   ??? Cefepime  2 g Intravenous Q8H   ??? folic acid  1 mg Oral Daily   ??? gabapentin  400 mg Oral TID   ??? flu vacc [id number] 6mos up(PF)  0.5 mL Intramuscular During hospitalization   ??? insulin glargine  24 Units Subcutaneous Daily   ??? insulin lispro  0-12 Units Subcutaneous ACHS   ??? insulin lispro  0-12 Units Subcutaneous TID AC   ??? lipase-protease-amylase  10 capsule Oral 3xd Meals   ??? MVW Complete (pediatric multivit 61-D3-vit K)  2 capsule Oral Daily   ??? OLANZapine  15 mg Oral Nightly   ??? pantoprazole  40 mg Oral BID   ??? phytonadione (vitamin K1)  5 mg Oral Once per day on Mon Thu   ??? polyethylene glycol  17 g Oral Daily   ??? sodium chloride  4 mL Nebulization 4x Daily (RT)   ??? thiamine  100 mg Oral Daily   ??? tobramycin (NEBCIN) IVPB (conventional dosing)  600 mg Intravenous Q24H [facility]       Past Medical History:   Diagnosis Date   ??? Alcohol abuse    ??? Bipolar disorder (CMS-HCC)    ??? Chronic pancreatitis (CMS-HCC)    ??? Chronic sinusitis    ??? 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 Cystic fibrosis (CMS-HCC)    ??? Diabetes mellitus (CMS-HCC)     dx in-house 2015   ??? Kidney stones    ???  Portal hypertension (CMS-HCC)        Family History   Problem Relation Age of Onset   ??? Diabetes Maternal Grandmother    ??? Diabetes Maternal Grandfather    ??? No Known Problems Mother    ??? Cancer Father    ??? No Known Problems Sister        Social History   Substance Use Topics   ??? Smoking status: Former Smoker     Types: Cigarettes   ??? Smokeless tobacco: Never Used   ??? Alcohol use 11.4 oz/week     16 Cans of beer, 3 Glasses of wine per week       PHYSICAL EXAMINATION:  BP 143/96  - Pulse 81  - Temp 37.8 ??C (Oral)  - Resp 18  - Ht 182.8 cm (5' 11.97)  - Wt 74 kg (163 lb 1.6 oz)  - SpO2 93%  - BMI 22.14 kg/m??   Wt Readings from Last 12 Encounters:   04/21/17 74 kg (163 lb 1.6 oz)   02/20/17 75.3 kg (166 lb)   02/10/17 75.7 kg (166 lb 12.8 oz)   12/01/16 72.6 kg (160 lb)   10/09/16 76.2 kg (167 lb 15.9 oz)   08/04/16 77.4 kg (170 lb 10.2 oz)   07/24/16 70.3 kg (155 lb)   06/19/16 80.4 kg (177 lb 4.8 oz)   05/18/16 79.4 kg (175 lb)   04/10/16 79 kg (174 lb 2.6 oz)   03/19/16 69.3 kg (152 lb 11.2 oz)   11/25/15 79.2 kg (174 lb 11.2 oz)     GEN: NAD noted  HEENT: MMM  MSK: normal tone  SKIN: warm and dry  Resp: CTAB  NEURO: alert and oriented  PSYCH: pleasant, cooperative    BG/insulin reviewed per EMR.      Summary of labs:    Data Review  Lab Results   Component Value Date    A1C 10.5 (H) 02/01/2017    A1C 8.2 (H) 12/02/2016    A1C 8.8 (H) 07/28/2016     Lab Results   Component Value Date    GFR >= 60 10/31/2010    CREATININE 0.68 (L) 04/23/2017     Lab Results   Component Value Date    WBC 5.7 04/23/2017    HGB 13.3 (L) 04/23/2017    HCT 39.6 (L) 04/23/2017    PLT 79 (L) 04/23/2017       Lab Results   Component Value Date    NA 131 (L) 04/23/2017    K 4.7 04/23/2017    CL 92 (L) 04/23/2017    CO2 28.0 04/23/2017    BUN 16 04/23/2017    CREATININE 0.68 (L) 04/23/2017    GLU 486 (HH) 04/23/2017    CALCIUM 9.1 04/23/2017    MG 1.7 04/23/2017    PHOS 4.9 (H) 04/23/2017       Lab Results   Component Value Date    BILITOT 0.7 04/18/2017    BILIDIR 0.20 04/18/2017    PROT 6.3 (L) 04/18/2017    ALBUMIN 3.2 (L) 04/18/2017    ALT 30 04/18/2017    AST 29 04/18/2017    ALKPHOS 183 (H) 04/18/2017    GGT 29 03/27/2016       Lab Results   Component Value Date    INR 0.94 04/21/2017    APTT 31.2 04/21/2017

## 2017-04-23 NOTE — Unmapped (Signed)
Endocrinology Consult Note   Requesting Attending Physician : Nyra Jabs, MD  Service Requesting Consult : Pulmonology (MDG)  Primary Care Provider: Dellis Filbert, MD  Outpatient Endocrinologist: Jackquline Denmark, MD (last outpatient visit 11/2015)    IMPRESSION:  Walter [name] is a 36 y.o. male admitted for Alcohol withdrawal (CMS-HCC). I have been asked to evaluate Walter [name] for hyperglycmia. We are consulted for hyperglycemia in CFRD    RECOMMENDATIONS:  1. 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  -- increase glargine to 24u QHS  -- start self managmenet for lispro mealtime up to 12u depending on meal size.  Insulin ordered AC + 2 x daily PRN  -- continue SSI ACHS.  -- Educated patient on glucose control and lung function in CF, patient was not aware.      2. Nutrition:  Variable po intake complicating overall glycemic control.     I have communicated recs with primary team.    Patient seen and discussed with Dr. Tiburcio Pea    HPI:  Walter [name]??is a 36 y.o.??male??with h/o CF c/b CFRD, alcohol abuse, bipolar disorder admitted for Cystic fibrosis exacerbation (RAF-HCC). I have been asked to evaluate Walter [name]??for CFRD management. At home he is on Lantus 25u qAm and Novolog 4-8u premeal (does not carb count, estimates based on what he's eating and the size of the meal; adds correction but again estimates). Reports checking glucose 6x daily at home.  Did not f/u with endocrine after last admission.  He has lost 3-5 pounds recently      Current inpatient diabetes regimen:  lantus 20 units qAM  Lispro standard SS achs    Glucose 106-579 yesterday  Patient says that he is eating 4-5 meals daily and has not been getting insulin with meals.      ROS: Per HPI, otherwise remaining of 10 systems negative.    ??? albuterol  2.5 mg Nebulization 4x Daily (RT)   ??? amLODIPine  5 mg Oral Daily   ??? Cefepime  2 g Intravenous Q8H   ??? folic acid  1 mg Oral Daily   ??? gabapentin  400 mg Oral TID   ??? flu vacc [id number] 6mos up(PF)  0.5 mL Intramuscular During hospitalization   ??? [START ON 04/23/2017] insulin glargine  24 Units Subcutaneous Daily   ??? insulin lispro  0-12 Units Subcutaneous ACHS   ??? insulin lispro  0-12 Units Subcutaneous TID AC   ??? lipase-protease-amylase  10 capsule Oral 3xd Meals   ??? MVW Complete (pediatric multivit 61-D3-vit K)  2 capsule Oral Daily   ??? OLANZapine  15 mg Oral Nightly   ??? pantoprazole  40 mg Oral BID   ??? phytonadione (vitamin K1)  5 mg Oral Once per day on Mon Thu   ??? polyethylene glycol  17 g Oral Daily   ??? sodium chloride  4 mL Nebulization 4x Daily (RT)   ??? thiamine  100 mg Oral Daily   ??? tobramycin (NEBCIN) IVPB (conventional dosing)  600 mg Intravenous Q24H [facility]       Past Medical History:   Diagnosis Date   ??? Alcohol abuse    ??? Bipolar disorder (CMS-HCC)    ??? Chronic pancreatitis (CMS-HCC)    ??? Chronic sinusitis    ??? 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 Cystic fibrosis (CMS-HCC)    ??? Diabetes mellitus (CMS-HCC)     dx in-house 2015   ???  Kidney stones    ??? Portal hypertension (CMS-HCC)        Family History   Problem Relation Age of Onset   ??? Diabetes Maternal Grandmother    ??? Diabetes Maternal Grandfather    ??? No Known Problems Mother    ??? Cancer Father    ??? No Known Problems Sister        Social History   Substance Use Topics   ??? Smoking status: Former Smoker     Types: Cigarettes   ??? Smokeless tobacco: Never Used   ??? Alcohol use 11.4 oz/week     16 Cans of beer, 3 Glasses of wine per week       PHYSICAL EXAMINATION:  BP 138/94  - Pulse 78  - Temp 37.4 ??C (Oral)  - Resp 21  - Ht 182.8 cm (5' 11.97)  - Wt 74 kg (163 lb 1.6 oz)  - SpO2 96%  - BMI 22.14 kg/m??   Wt Readings from Last 12 Encounters:   04/21/17 74 kg (163 lb 1.6 oz)   02/20/17 75.3 kg (166 lb)   02/10/17 75.7 kg (166 lb 12.8 oz)   12/01/16 72.6 kg (160 lb)   10/09/16 76.2 kg (167 lb 15.9 oz)   08/04/16 77.4 kg (170 lb 10.2 oz)   07/24/16 70.3 kg (155 lb)   06/19/16 80.4 kg (177 lb 4.8 oz)   05/18/16 79.4 kg (175 lb)   04/10/16 79 kg (174 lb 2.6 oz)   03/19/16 69.3 kg (152 lb 11.2 oz)   11/25/15 79.2 kg (174 lb 11.2 oz)     GEN: NAD noted  HEENT: MMM  MSK: normal tone  SKIN: warm and dry  CV: RRR  Resp: CTAB  NEURO: alert and oriented  PSYCH: pleasant, cooperative    BG/insulin reviewed per EMR.      Summary of labs:    Data Review  Lab Results   Component Value Date    A1C 10.5 (H) 02/01/2017    A1C 8.2 (H) 12/02/2016    A1C 8.8 (H) 07/28/2016     Lab Results   Component Value Date    GFR >= 60 10/31/2010    CREATININE 0.67 (L) 04/22/2017     Lab Results   Component Value Date    WBC 5.7 04/22/2017    HGB 12.4 (L) 04/22/2017    HCT 36.4 (L) 04/22/2017    PLT 84 (L) 04/22/2017       Lab Results   Component Value Date    NA 130 (L) 04/22/2017    K 4.4 04/22/2017    CL 90 (L) 04/22/2017    CO2 31.0 (H) 04/22/2017    BUN 15 04/22/2017    CREATININE 0.67 (L) 04/22/2017    GLU 466 (HH) 04/22/2017    CALCIUM 8.7 04/22/2017    MG 1.4 (L) 04/22/2017    PHOS 5.2 (H) 04/22/2017       Lab Results   Component Value Date    BILITOT 0.7 04/18/2017    BILIDIR 0.20 04/18/2017    PROT 6.3 (L) 04/18/2017    ALBUMIN 3.2 (L) 04/18/2017    ALT 30 04/18/2017    AST 29 04/18/2017    ALKPHOS 183 (H) 04/18/2017    GGT 29 03/27/2016       Lab Results   Component Value Date    INR 0.94 04/21/2017    APTT 31.2 04/21/2017

## 2017-04-24 LAB — CBC
HEMATOCRIT: 43.2 % (ref 41.0–53.0)
HEMOGLOBIN: 14.3 g/dL (ref 13.5–17.5)
MEAN CORPUSCULAR HEMOGLOBIN CONC: 33.2 g/dL (ref 31.0–37.0)
MEAN CORPUSCULAR HEMOGLOBIN: 31.9 pg (ref 26.0–34.0)
MEAN CORPUSCULAR VOLUME: 95.9 fL (ref 80.0–100.0)
PLATELET COUNT: 96 10*9/L — ABNORMAL LOW (ref 150–440)
RED BLOOD CELL COUNT: 4.5 10*12/L (ref 4.50–5.90)
RED CELL DISTRIBUTION WIDTH: 14.9 % (ref 12.0–15.0)
WBC ADJUSTED: 7 10*9/L (ref 4.5–11.0)

## 2017-04-24 LAB — BASIC METABOLIC PANEL
ANION GAP: 15 mmol/L (ref 9–15)
ANION GAP: 14 mmol/L (ref 9–15)
BLOOD UREA NITROGEN: 20 mg/dL (ref 7–21)
BUN / CREAT RATIO: 30
BUN / CREAT RATIO: 30
CALCIUM: 9.2 mg/dL (ref 8.5–10.2)
CALCIUM: 9.1 mg/dL (ref 8.5–10.2)
CHLORIDE: 91 mmol/L — ABNORMAL LOW (ref 98–107)
CHLORIDE: 96 mmol/L — ABNORMAL LOW (ref 98–107)
CO2: 26 mmol/L (ref 22.0–30.0)
CO2: 25 mmol/L (ref 22.0–30.0)
CREATININE: 0.7 mg/dL (ref 0.70–1.30)
CREATININE: 0.67 mg/dL — ABNORMAL LOW (ref 0.70–1.30)
EGFR MDRD AF AMER: >=60 mL/min/{1.73_m2} (ref >=60)
EGFR MDRD AF AMER: >=60 mL/min/{1.73_m2} (ref >=60)
EGFR MDRD NON AF AMER: >=60 mL/min/{1.73_m2} (ref >=60)
EGFR MDRD NON AF AMER: >=60 mL/min/{1.73_m2} (ref >=60)
GLUCOSE RANDOM: 576 mg/dL (ref 65–179)
GLUCOSE RANDOM: 250 mg/dL — ABNORMAL HIGH (ref 65–179)
POTASSIUM: 5.2 mmol/L — ABNORMAL HIGH (ref 3.5–5.0)
POTASSIUM: 4.7 mmol/L (ref 3.5–5.0)
SODIUM: 135 mmol/L (ref 135–145)

## 2017-04-24 LAB — HEMOGLOBIN A1C
ESTIMATED AVERAGE GLUCOSE: 194 mg/dL
HEMOGLOBIN A1C: 8.4 % — ABNORMAL HIGH (ref 4.8–5.6)
Hemoglobin A1c/Hemoglobin.total:MFr:Pt:Bld:Qn:: 8.4 — ABNORMAL HIGH

## 2017-04-24 LAB — MAGNESIUM: Magnesium:MCnc:Pt:Ser/Plas:Qn:: 1.9

## 2017-04-24 LAB — ANION GAP: Anion gap 3:SCnc:Pt:Ser/Plas:Qn:: 14

## 2017-04-24 LAB — PHOSPHORUS: Phosphate:MCnc:Pt:Ser/Plas:Qn:: 5.3 — ABNORMAL HIGH

## 2017-04-24 LAB — GLUCOSE RANDOM: Glucose:MCnc:Pt:Ser/Plas:Qn:: 576

## 2017-04-24 LAB — RED BLOOD CELL COUNT: Lab: 4.5

## 2017-04-24 NOTE — Unmapped (Signed)
Aminoglycoside Follow-Up Pharmacy Note    Walter [name] is a 36 y.o. male on tobramycin 600 mg IV  every 24 hours for CF exacerbation.  Currently on day 6 of therapy. (Dose received this AM at 0851)    Goal peak: 20 - 30   Goal trough: gentamicin/tobramycin <1    Measured peak = 18.8 mg/L  Calculated peak = 27.5 mg/L    Measured trough/random = 1.7 mg/L  Calculated trough = <1 mg/L    Patient-Specific Pharmacokinetic Parameters:  Vd = 25.6 L, ke = 0.38 hr-1    Based on levels, recommend continue at 600mg  every 24hours.    Pharmacy will continue to monitor for changes in renal function, toxicity, and efficacy and order additional levels as needed. Please page service pharmacist with questions/clarifications.    Theodoro Clock,  PharmD

## 2017-04-24 NOTE — Unmapped (Signed)
Endocrinology Consult Note   Requesting Attending Physician : Nyra Jabs, MD  Service Requesting Consult : Pulmonology (MDG)  Primary Care Provider: Dellis Filbert, MD  Outpatient Endocrinologist: Jackquline Denmark, MD (last outpatient visit 11/2015)    IMPRESSION:  Walter [name] is a 36 y.o. male admitted for Alcohol withdrawal (CMS-HCC). I have been asked to evaluate [facility] for hyperglycmia. We are consulted for hyperglycemia in CFRD    RECOMMENDATIONS:  1. 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  -- suspect patient is eating overnight without bolus insulin.  -- hyperglycemia worsened by psychologic stress  -- repeat A1C sent  -- increase glargine to 34u QAM today  -- continue self managmenet for lispro mealtime up to 15u depending on meal size.  Insulin ordered AC + 2 x daily PRN  -- continue SSI lispro standard ACHS.    2. Nutrition:  Variable po intake complicating overall glycemic control.     I have communicated recs with primary team.    Patient seen and discussed with Dr. Cory Roughen.     HPI:  Walter [name]??is a 36 y.o.??male??with h/o CF c/b CFRD, alcohol abuse, bipolar disorder admitted for Cystic fibrosis exacerbation (RAF-HCC). I have been asked to evaluate Sevon??for CFRD management. At home he is on Lantus 25u qAm and Novolog 4-8u premeal (does not carb count, estimates based on what he's eating and the size of the meal; adds correction but again estimates). Reports checking glucose 6x daily at home.  Did not f/u with endocrine after last admission.  He has lost 3-5 pounds recently      Interval events: Patient was on the phone and refusing evaluation on rounds.  Glu 184-576.  Mealtime boluses given during the day but not overnight.          ROS: Per HPI, otherwise remaining of 10 systems negative.    ??? albuterol  2.5 mg Nebulization 4x Daily (RT)   ??? amLODIPine  5 mg Oral Daily   ??? Cefepime  2 g Intravenous Q8H ??? folic acid  1 mg Oral Daily   ??? gabapentin  400 mg Oral TID   ??? flu vacc [id number] 6mos up(PF)  0.5 mL Intramuscular During hospitalization   ??? insulin glargine  34 Units Subcutaneous Daily   ??? insulin lispro  0-12 Units Subcutaneous ACHS   ??? insulin lispro  0-15 Units Subcutaneous TID AC   ??? lipase-protease-amylase  10 capsule Oral 3xd Meals   ??? MVW Complete (pediatric multivit 61-D3-vit K)  2 capsule Oral Daily   ??? OLANZapine  15 mg Oral Nightly   ??? pantoprazole  40 mg Oral BID   ??? phytonadione (vitamin K1)  5 mg Oral Once per day on Mon Thu   ??? polyethylene glycol  17 g Oral Daily   ??? sodium chloride  4 mL Nebulization 4x Daily (RT)   ??? thiamine  100 mg Oral Daily   ??? tobramycin (NEBCIN) IVPB (conventional dosing)  600 mg Intravenous Q24H [facility]       Past Medical History:   Diagnosis Date   ??? Alcohol abuse    ??? Bipolar disorder (CMS-HCC)    ??? Chronic pancreatitis (CMS-HCC)    ??? Chronic sinusitis    ??? 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 Cystic fibrosis (CMS-HCC)    ??? Diabetes mellitus (CMS-HCC)     dx in-house 2015   ??? Kidney stones    ???  Portal hypertension (CMS-HCC)        Family History   Problem Relation Age of Onset   ??? Diabetes Maternal Grandmother    ??? Diabetes Maternal Grandfather    ??? No Known Problems Mother    ??? Cancer Father    ??? No Known Problems Sister        Social History   Substance Use Topics   ??? Smoking status: Former Smoker     Types: Cigarettes   ??? Smokeless tobacco: Never Used   ??? Alcohol use 11.4 oz/week     16 Cans of beer, 3 Glasses of wine per week       PHYSICAL EXAMINATION:  BP 127/82  - Pulse 85  - Temp 36.5 ??C (Oral)  - Resp 18  - Ht 182.8 cm (5' 11.97)  - Wt 74 kg (163 lb 1.6 oz)  - SpO2 93%  - BMI 22.14 kg/m??   Wt Readings from Last 12 Encounters:   04/21/17 74 kg (163 lb 1.6 oz)   02/20/17 75.3 kg (166 lb)   02/10/17 75.7 kg (166 lb 12.8 oz)   12/01/16 72.6 kg (160 lb)   10/09/16 76.2 kg (167 lb 15.9 oz)   08/04/16 77.4 kg (170 lb 10.2 oz)   07/24/16 70.3 kg (155 lb) 06/19/16 80.4 kg (177 lb 4.8 oz)   05/18/16 79.4 kg (175 lb)   04/10/16 79 kg (174 lb 2.6 oz)   03/19/16 69.3 kg (152 lb 11.2 oz)   11/25/15 79.2 kg (174 lb 11.2 oz)     GEN: NAD, sitting up in bed talking on phone.    BG/insulin reviewed per EMR.      Summary of labs:    Data Review  Lab Results   Component Value Date    A1C 10.5 (H) 02/01/2017    A1C 8.2 (H) 12/02/2016    A1C 8.8 (H) 07/28/2016     Lab Results   Component Value Date    GFR >= 60 10/31/2010    CREATININE 0.70 04/24/2017     Lab Results   Component Value Date    WBC 7.0 04/24/2017    HGB 14.3 04/24/2017    HCT 43.2 04/24/2017    PLT 96 (L) 04/24/2017       Lab Results   Component Value Date    NA 132 (L) 04/24/2017    K 5.2 (H) 04/24/2017    CL 91 (L) 04/24/2017    CO2 26.0 04/24/2017    BUN 21 04/24/2017    CREATININE 0.70 04/24/2017    GLU 576 (HH) 04/24/2017    CALCIUM 9.2 04/24/2017    MG 1.9 04/24/2017    PHOS 5.3 (H) 04/24/2017       Lab Results   Component Value Date    BILITOT 0.7 04/18/2017    BILIDIR 0.20 04/18/2017    PROT 6.3 (L) 04/18/2017    ALBUMIN 3.2 (L) 04/18/2017    ALT 30 04/18/2017    AST 29 04/18/2017    ALKPHOS 183 (H) 04/18/2017    GGT 29 03/27/2016       Lab Results   Component Value Date    INR 0.94 04/21/2017    APTT 31.2 04/21/2017

## 2017-04-24 NOTE — Unmapped (Signed)
Problem: Patient Care Overview  Goal: Plan of Care Review  Outcome: Progressing   04/23/17 1731   OTHER   Plan of Care Reviewed With patient   Plan of Care Review   Progress improving     Pt A/Ox4.  VSS . Pt on RA.  No respiratory distress noted Remains afebrile.  Pt c/o abdominal pain.  Pt given PRN Oxycodone and Tylenol x1 which controled pain per pt's report.   Pt's blood sugar has been in the high 400s.  Pt  denies s/s of hyperglycemia. Pt received correctional dose of insulin per MAR.  Pt tolerated antibiotics well with no adverse reaction.. Will continue to monitor.    Problem: Pain, Acute (Adult)  Goal: Acceptable Pain Control/Comfort Level  Patient will demonstrate the desired outcomes by discharge/transition of care.   Outcome: Progressing      Problem: Cystic Fibrosis (Adult)  Goal: Signs and Symptoms of Listed Potential Problems Will be Absent, Minimized or Managed (Cystic Fibrosis)  Signs and symptoms of listed potential problems will be absent, minimized or managed by discharge/transition of care (reference Cystic Fibrosis (Adult) CPG).   Outcome: Progressing      Problem: VTE, DVT and PE (Adult)  Goal: Signs and Symptoms of Listed Potential Problems Will be Absent, Minimized or Managed (VTE, DVT and PE)  Signs and symptoms of listed potential problems will be absent, minimized or managed by discharge/transition of care (reference VTE, DVT and PE (Adult) CPG).   Outcome: Progressing

## 2017-04-24 NOTE — Unmapped (Signed)
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 *           Walter [name] is a 36 y.o. male with PMHx as reviewed in the EMR that presented to [facility] with Alcohol withdrawal (CMS-HCC).  ??  Alcohol Withdrawal, resolved, h/o Alcohol Use D/O:   - folvite, MV, thiamine, Vit K 5mg  2x/wk  - compazine PRN nausea  - Consulted psychiatry who will come to see him 11/12  - Discussing possible substance use counseling/rehab after discharge    Mood Disorder: History of depression and possible bipolar? (mentioned in problem list). History of SI but has not endorsed SI during this hospitalization. Psychiatric problems along with alcohol abuse are greatly contributing to his poor health and continued need for hospital care. Patient is also recently homeless.   - Home zyprexa 15mg  QHS  - ask if still taking zoloft 100mg  (on med list)  - Psychiatry consulted, will see patient on 11/12, appreciate recs    CFrDM: sugars continue to be poorly controlled despite starting home insulin.  --- Endocrinology consulted; recs appreciated  --- increased Lantus to 34 units daily   --- Lispro SSI  --- Meal time and snack carb counting insulin    CF Pulmonary Exacerbation: Continues to encounter issues with noncompliance w/ treatments & airway clearance. RVP negative. Repeat PFTs improved from prior w/ FEV1 of 1.75L (37.8% predicted) from previous 1.24L (26% predicted) on 8/30.  - IV Tobramycin (11/4-), Cefepime (11/5-)  - duonebs, hypertonic 3%, Chest vest per RT  - f/u CF culture    Hypertension: new dx  - Continue amlodipine 5mg  daily    Thrombocytopenia, improving: Pt does have history of cirrhosis, but plt typically >150. Differential includes splenic sequestration secondary to portal hypertension, marrow suppression from chronic alcohol use, vs less likely heparin-induced thrombocytopenia. Smear did not reveal schistocytes and LDH/haptoglobin, INR, and PTT were wnl, Hgb has been stable. Continue to improve  - hold lovenox, may be able to restart  - Will discuss with Dr. Lurena Nida re: indication for repeat abdominal imaging.     Abdominal Pain, diarrhea, improving: in setting of binge drinking, chronic pancreatitis, & possible gastritis 2/2 alcohol. May also have constipation with overflow as KUB on admission showed moderate colonic stool burden. 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 Continues to improve, stools now more solid  - daily Miralax (Encouraged compliance)  - PRN tylenol   - Zenpep    Hx of severe protein-calorie malnutrition, Vitamin D Deficiency: Binge drinking last two weeks before admission, not eating much at all. Will monitor for refeeding. 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 ??  H/o Opiate Use D/O: CTM  ??  Diet: Regular  DVT PPx: Holding 2/2 thrombocytopenia,  may be able to restart as thrombocytopenia improves  GI PPx: protonix  Code Status: FULL  Dispo: Med G  ___________________________________________________________________    Subjective:  No acute events overnight. He reports he is feeling better overall but is mainly annoyed at interruptions, people coming into his room. He  Is open to talking with psychiatry. He says his stools continue to be formed since he has been eating, abdominal pain much improved. Labs/Studies:  Labs and Studies from the last 24hrs per EMR and Reviewed    Physical Exam:  Vitals:    04/24/17 0602   BP: 127/82   Pulse: 85   Resp: 18   Temp: 36.5 ??C   SpO2: 93%     General: sitting up in bed, disheveled  HEENT: atraumatic, normocephalic, EOMI  Cardiac: RRR  Pulmonary: no accessory muscle use or excess work of breathing  Abdomen: soft, ND, persistent mild epigastric tenderness   Extremities: warm, 2+ DP and radial pulses

## 2017-04-24 NOTE — Unmapped (Signed)
[facility]    Initial Psychiatry Consult     Service Date: April 24, 2017  LOS:  LOS: 6 days   Consulting Attending: Kellie Simmering, MD  Consulting Resident: Sherald Hess, MD     Assessment:   Walter [name] is a 36 y.o. 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  Psychiatry was consulted by Nyra Jabs, MD with Pulmonology ([facility]) for Detox and alcohol use disorder treatment.    The patient's current presentation is most consistent with alcohol use disorder and substance-induced depressive disorder. He has recent stressors of conflict with his mother and father's death, although his reaction to this significant stressor seems in excess of expected grief reaction. He has coped poorly with increasing alcohol use, stimulant use (adderall obtained from a friend), and further isolation from sources of support in his life. His prominent depressive symptoms of amotivation, apathy, anhedonia, depressed mood, poor sleep, guilt, fatigue, low energy, and poor concentration would meet the criteria for major depressive episode, though it is in the setting of significant daily alcohol and stimulant use.  As such, it is most consistent with substance-induced mood disorder. While patient would cetainly warrant treatment of his underlying alcohol use disorder, he is agreeable to naltrexone and outpatient treatment at this time.    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    Stressors: finances, chronic medical condition and lack of social support    Safety Risk Assessment:  Risk factors for self-harm/suicide: previous suicide attempt(s), feelings of hopelessness, lack of social support, sense of isolation, unwillingness to seek help, barriers to accessing mental health treatment, current substance abuse, current diagnosis of depression, poor adherence to treatment , male age 32-35, chronic severe medical condition, chronic mental illness > 5 years, chronic impulsivity and chronic poor judgment  Protective factors against self-harm/suicide:  lack of active SI, no know access to weapons or firearms, supportive friends and safe housing  Risk factors for harm to others: current substance abuse  Protective factors against harm to others: no known violence towards others in the last 6 months and no known homicidal ideation in the last 6 months  While future psychiatric events cannot be accurately predicted, the patient is not currently at elevated imminent risk, and is at elevated chronic risk of harm to self and is not currently at elevated imminent risk, and is not at elevated chronic risk of harm to others.     Recommendations:   -- Safety Recommendations:   ?? If patient attempts to leave against medical advice and it is felt to be unsafe for them to leave, please call a Behavioral Response and page Psychiatry at [phone]   ?? No acute safety concerns    -- Medication Recommendations:   ?? Continue zyprexa 15 mg qhs  ?? Hold home zoloft as patient had not been adherent   ?? Continue thiamine, folic acid, MVI  ?? Patient  is interested in starting Naltrexone. Would defer Naltrexone until he establishes with outpatient psychiatrist or alcohol outpatient treatment     -- Medical Decision Making Capacity:   ?? Not formally assessed    -- Further Work-up:   ?? None at this time    -- Disposition:   ?? There are no psychiatric contraindications to discharging this patient when medically appropriate.  ?? While the patient is not currently receiving outpatient mental health care, we recommended to them that they establish care by contacting either their insurance carrier or their local Managed Care Organization, [employer] [phone]).   ?? Pt previously worked with Serita Grit, Child psychotherapist with pulmonology. He expressed interest in working with her again.  ?? Pt is declining any referrals to structured living such as [facility] or residential treatment such as [facility]  ?? Upon discharge patient would likely benefit from finding a therapist; patient can find information about therapists in the area using www.psychologytoday.com, which can be provided in a discharge summary.      Thank you for this consult request. Recommendations have been communicated to the primary team.  We will follow as needed at this time. Please [phone] (after hours) for any questions or concerns.     Discussed with  Attending Kellie Simmering, MD, who agrees with the assessment and plan.     Sherald Hess, MD    I examined the patient with the resident and agree with the assessment and plan as documented.      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    Relevant Aspects of Hospital Course:   Admitted on 04/18/2017 for alcohol detox and CF bronchiectasis exacerbation.    Patient Report:   Patient notes frustration with care and current living situation. He states he has a lot of issues alcohol, depression, his mother, and his father's death. He is really pissed off with his mother, when she tried to call his work.     He notes that he has liver damage and an enlarged spleen. He came to the hospital because he was going through alcohol withdrawal. He was shaking to the point he was going to have a seizure. He denies prior seizures, DTs and hx of complicated withdrawal. He was drinking 3-6 cans of 24oz beer per day. He came to the hospital for medical detox. He notes ongoing cravings for alcohol. He does not want to go to residential treatment facility. He does not want to go to 12 step program either. He would prefer to attend an outpatient treatment program in [place]. He is currently applying for disability. He is both motivated and confident in his ability to maintain sobriety from alcohol. He feels that his sobriety and completion of an outpatient treatment will be helpful to his overall health and support his disability claim. He also expresses ambivalence towards maintaining sobriety and at times thinks that he could start drinking again in the future.    He notes recent stress related his father's death in 2022/12/21. He reports staying for sober, but relapsed on alcohol. He was also was using adderall in the mornings, which he  was receiving from a friend. He notes he was using it to help with energy, fatigue, and motivation. He notes passive SI and low mood. He notes amotivation.    Patient interested in restarting naltrexone. He previously stopped taking naltrexone because of cost, although feels he should be able to afford it now. He is willing to try something else beside lexipro and Zoloft for depression. He has been taking gabapentin for anxiety and restarted it after being discharged from [facility] psychiatry inpatient hospital in late 2017.      ROS:  Constitutional: Denies fever/chills and weight loss  Cardiac: Denies chest pain and palpitations  Lungs: Denies shortness of breath  and dyspnea  GI: Denies nausea, vomiting , diarrhea  and constipation  Musculoskeletal: Denies muscle aches, spasms, and weakness  Neuro: Denies involuntary movements, tremor, weakness/numbness, headaches and dizziness      Collateral information:   Review of [place] Controlled Substance Reporting System,       Psychiatric History:   Psychiatric diagnoses: alcohol abuse disorder, bipolar disorder  Medication trials/compliance: Zyprexa 15 mg, Zoloft and lexipro (made him too numb), Naltrexone, Prozac, Lithium (was horrible), Celexa, (the worst), trazodone (mademe feel funny), remeron (didn't work), Subutex  Suicide attempts and/or non-suicidal self-injury: SA by OD w/ Xanax 2015  Current psychiatrist: none, previously with [facility] in [place]  ?? 3 prior psychiatric hospitalizations [facility] 11/22-16/6/17 for SI, mania, [facility] 2015 for suicide attempt, [place] for depression)  ?? [facility]2016 - discharged on Zyprexa 15 mg and naltrexone 15 mg qhs, discontinued gabapentin, discontinued seroquel  ?? Seen by consult service 01/2017 (recommended increasing Zoloft) and 06/2016 (recommended starting remeron)  ?? Seen by [place] consult service 09/2016 (zyprexa 5 mg qhs, zoloft 200 mg daily, and gabapentin 300 mg TID, hold naltrexone)  ?? Previously 8 months sobriety with involvement in IOP and AA  Family psychiatric history:  Mother has bipolar disorder    Medical History:  Past Medical History:   Diagnosis Date   ??? Alcohol abuse    ??? Bipolar disorder (CMS-HCC)    ??? Chronic pancreatitis (CMS-HCC)    ??? Chronic sinusitis    ??? 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 Cystic fibrosis (CMS-HCC)    ??? Diabetes mellitus (CMS-HCC)     dx in-house 2015   ??? Kidney stones    ??? Portal hypertension (CMS-HCC)        Surgical History:  Past Surgical History:   Procedure Laterality Date   ??? CHOLECYSTECTOMY  2008   ??? sinus surgery     ??? TONSILLECTOMY         Medications:     Current Facility-Administered Medications:   ???  acetaminophen (TYLENOL) tablet 650 mg, 650 mg, Oral, Q4H PRN, Elayne Guerin, MD, 650 mg at 04/23/17 0904  ???  albuterol 2.5 mg /3 mL (0.083 %) nebulizer solution 2.5 mg, 2.5 mg, Nebulization, 4x Daily (RT), Elayne Guerin, MD, 2.5 mg at 04/24/17 1447  ???  amLODIPine (NORVASC) tablet 5 mg, 5 mg, Oral, Daily, Jearld Pies, MD, 5 mg at 04/24/17 8119  ???  cefepime (MAXIPIME) 2 g in dextrose 100 mL IVPB (premix), 2 g, Intravenous, Q8H, Jearld Pies, MD, Last Rate: 200 mL/hr at 04/24/17 1319, 2 g at 04/24/17 1319  ???  dextrose 50 % in water (D50W) solution 12.5 g, 12.5 g, Intravenous, Q10 Min PRN, Elayne Guerin, MD  ???  folic acid (FOLVITE) tablet 1 mg, 1 mg, Oral, Daily,  Elayne Guerin, MD, 1 mg at 04/24/17 1610  ???  gabapentin (NEURONTIN) capsule 400 mg, 400 mg, Oral, TID, Elayne Guerin, MD, 400 mg at 04/24/17 1319  ???  influenza vaccine quad syringe (FLULAVAL) (6 MOS & UP) (PF) 2018-19, 0.5 mL, Intramuscular, During hospitalization, Tanja Port, MD  ???  insulin glargine (LANTUS) injection 34 Units, 34 Units, Subcutaneous, Daily, Jearld Pies, MD, 34 Units at 04/24/17 1227  ???  insulin lispro (HumaLOG) injection 0-12 Units, 0-12 Units, Subcutaneous, ACHS, Jearld Pies, MD, 2 Units at 04/24/17 1317  ???  insulin lispro (HumaLOG) injection 0-15 Units, 0-15 Units, Subcutaneous, BID PRN, Elayne Guerin, MD, 12 Units at 04/23/17 2105  ???  insulin lispro (HumaLOG) injection 0-15 Units, 0-15 Units, Subcutaneous, TID AC, Elayne Guerin, MD, 15 Units at 04/24/17 1318  ???  lipase-protease-amylase (ZENPEP) 20,000-63,000- 84,000 unit capsule, delayed release 100,000 units of lipase, 5 capsule, Oral, With snacks, Jearld Pies, MD  ???  lipase-protease-amylase (ZENPEP) 20,000-63,000- 84,000 unit capsule, delayed release 200,000 units of lipase, 10 capsule, Oral, 3xd Meals, Jearld Pies, MD, 200,000 units of lipase at 04/24/17 1225  ???  MVW Complete (pediatric multivit 61-D3-vit K) 1,500-800 unit-mcg 2 capsule, 2 capsule, Oral, Daily, Jearld Pies, MD, 2 capsule at 04/24/17 0841  ???  OLANZapine (ZYPREXA) tablet 15 mg, 15 mg, Oral, Nightly, Elayne Guerin, MD, 15 mg at 04/23/17 2102  ???  oxyCODONE (ROXICODONE) immediate release tablet 5 mg, 5 mg, Oral, Q8H PRN, Windell Moment, MD, 5 mg at 04/24/17 1319  ???  pantoprazole (PROTONIX) EC tablet 40 mg, 40 mg, Oral, BID, Elayne Guerin, MD, 40 mg at 04/24/17 1319  ???  phytonadione (vitamin K1) (MEPHYTON) tablet 5 mg, 5 mg, Oral, Once per day on Mon Thu, Katherine R Allen, MD, 5 mg at 04/22/17 0901  ???  polyethylene glycol (MIRALAX) packet 17 g, 17 g, Oral, Daily, Jearld Pies, MD, 17 g at 04/24/17 0841  ???  prochlorperazine (COMPAZINE) tablet 5 mg, 5 mg, Oral, Q6H PRN, Jearld Pies, MD, 5 mg at 04/21/17 1610  ???  sodium chloride 3 % nebulizer solution 4 mL, 4 mL, Nebulization, 4x Daily (RT), Jearld Pies, MD, 4 mL at 04/24/17 1452  ???  thiamine (B-1) tablet 100 mg, 100 mg, Oral, Daily, Elayne Guerin, MD, 100 mg at 04/24/17 0841  ???  tobramycin (NEBCIN) 600 mg in sodium chloride (NS) 0.9% 100 mL IVPB, 600 mg, Intravenous, Q24H SCH, Stopped at 04/24/17 1315 **AND** Inpatient consult to Pharmacy RX to dose: tobramycin, , , Once, Elayne Guerin, MD    Allergies:  Allergies   Allergen Reactions   ??? Quetiapine Shortness Of Breath     Pt states that he does not have an allergy to this medication.       Social History:   Living situation: the patient lives with roommates in [place].  Relationship Status: Single   Children: None  Education: Engineer, maintenance (IT)  Income/Employment/Disability: Currently employed   Abuse/Neglect/Trauma: emotional (by mother)  Domestic Violence: No  Current/Prior Legal: Yes; public intoxication, open container, related to one incident  Physical Aggression/Violence: None    Access to Firearms: None     Social History     Social History   ??? Marital status: Single     Spouse name: N/A   ??? Number of children: 0   ??? Years of education: 12+     Occupational History   ??? Not  on file.     Social History Main Topics   ??? Smoking status: Former Smoker     Types: Cigarettes   ??? Smokeless tobacco: Never Used   ??? Alcohol use 11.4 oz/week     16 Cans of beer, 3 Glasses of wine per week   ??? Drug use: Yes     Types: Marijuana      Comment: hx of alcohol and prescription pain medication abuse   ??? Sexual activity: Not on file     Other Topics Concern   ??? 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 Hillard Danker with [facility] in [place]    Current therapist: Nehemiah Settle with CF team        SUBSTANCE ABUSE HX:     # MJ     -Current use: once in awhile    # Alcohol     -Current use: Was sober from 2016 until two weeks ago. Admits to drinking enough to get drunk and admits to passing out. Denies history of complicated withdrawal. Admits to withdrawal symptoms of tremors, anxiety, headache, and tactile hallucinations at times.     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,  previously Acupuncturist, previously journalism major    -Abuse/neglect/victimization/trauma/DV: Reports emotional abuse from mother growing up    -Current/Prior Legal: Denies    Veterinary surgeon Service: None     -Family History: believes mother has bipolar         Objective:   Vital signs:   Temp:  [36.5 ??C-37.6 ??C] 36.6 ??C  Heart Rate:  [85-103] 103  Resp:  [16-18] 16  BP: (112-127)/(72-82) 117/72  MAP (mmHg):  [87-99] 87  SpO2:  [93 %-96 %] 93 %    Mental Status Exam:  Appearance:    Appears stated age and Lying in bed   Behavior:   Cooperative   Motor:   No abnormal movements   Speech/Language:    Normal rate, volume, tone, fluency and Language intact, well formed   Mood:   pissed off   Affect:   Irritable, Labile and frustrated   Thought process:   Logical, linear, clear, coherent, goal directed  Thought content:     Denies SI, HI, self harm, delusions, obsessions, paranoid ideation, or ideas of reference   Perceptual disturbances:     Denies auditory and visual hallucinations and Behavior not concerning for response to internal stimuli   Orientation:   Not formally assessed, but appears grossly oriented   Attention:   Able to fully attend without fluctuations in consciousness   Memory:   Not formally assessed, although appears grossly intact    Fund of knowledge:    Consistent with level of education and development   Insight:     Intact   Judgment:    Limited   Impulse Control:   Limited       Data Reviewed:  I reviewed labs from the last 24 hours.

## 2017-04-24 NOTE — Unmapped (Signed)
Problem: Patient Care Overview  Goal: Plan of Care Review  Outcome: Progressing  Patient is alert and oriented x3. No CP distress. Complain of intermittent right upper stomach ache with oxycodone po as management. ABT continued without advers reaction. Patient is ambulatory in the room.    04/24/17 0350   OTHER   Plan of Care Reviewed With patient     Goal: Individualization and Mutuality  Outcome: Progressing    Goal: Discharge Needs Assessment  Outcome: Progressing   04/24/17 0350   Discharge Needs Assessment   Concerns to be Addressed no discharge needs identified     Goal: Interprofessional Rounds/Family Conf  Outcome: Progressing   04/24/17 0350   Interdisciplinary Rounds/Family Conf   Participants nursing       Problem: Alcohol Withdrawal Acute, Risk/Actual (Adult)  Goal: Signs and Symptoms of Listed Potential Problems Will be Absent, Minimized or Managed (Alcohol Withdrawal Acute, Risk/Actual)  Signs and symptoms of listed potential problems will be absent, minimized or managed by discharge/transition of care (reference Alcohol Withdrawal Acute, Risk/Actual (Adult) CPG).   Outcome: Progressing      Problem: Pain, Acute (Adult)  Goal: Identify Related Risk Factors and Signs and Symptoms  Related risk factors and signs and symptoms are identified upon initiation of Human Response Clinical Practice Guideline (CPG).   Outcome: Progressing    Goal: Acceptable Pain Control/Comfort Level  Patient will demonstrate the desired outcomes by discharge/transition of care.   Outcome: Progressing   04/24/17 0350   Pain, Acute (Adult)   Acceptable Pain Control/Comfort Level making progress toward outcome       Problem: Cystic Fibrosis (Adult)  Goal: Signs and Symptoms of Listed Potential Problems Will be Absent, Minimized or Managed (Cystic Fibrosis)  Signs and symptoms of listed potential problems will be absent, minimized or managed by discharge/transition of care (reference Cystic Fibrosis (Adult) CPG).   Outcome: Progressing      Problem: VTE, DVT and PE (Adult)  Goal: Signs and Symptoms of Listed Potential Problems Will be Absent, Minimized or Managed (VTE, DVT and PE)  Signs and symptoms of listed potential problems will be absent, minimized or managed by discharge/transition of care (reference VTE, DVT and PE (Adult) CPG).   Outcome: Progressing

## 2017-04-24 NOTE — Unmapped (Signed)
Problem: Cystic Fibrosis (Adult)  Goal: Signs and Symptoms of Listed Potential Problems Will be Absent, Minimized or Managed (Cystic Fibrosis)  Signs and symptoms of listed potential problems will be absent, minimized or managed by discharge/transition of care (reference Cystic Fibrosis (Adult) CPG).   Per nurse do not enter pt room he is asleep and will refuse medications.

## 2017-04-25 LAB — BASIC METABOLIC PANEL
BLOOD UREA NITROGEN: 17 mg/dL (ref 7–21)
BUN / CREAT RATIO: 27
CALCIUM: 9.2 mg/dL (ref 8.5–10.2)
CHLORIDE: 97 mmol/L — ABNORMAL LOW (ref 98–107)
CO2: 29 mmol/L (ref 22.0–30.0)
CREATININE: 0.62 mg/dL — ABNORMAL LOW (ref 0.70–1.30)
EGFR MDRD AF AMER: >=60 mL/min/{1.73_m2} (ref >=60)
EGFR MDRD NON AF AMER: >=60 mL/min/{1.73_m2} (ref >=60)
GLUCOSE RANDOM: 229 mg/dL — ABNORMAL HIGH (ref 65–179)
POTASSIUM: 4.4 mmol/L (ref 3.5–5.0)

## 2017-04-25 LAB — CO2: Carbon dioxide:SCnc:Pt:Ser/Plas:Qn:: 29

## 2017-04-25 LAB — CBC
HEMATOCRIT: 42.7 % (ref 41.0–53.0)
HEMOGLOBIN: 14.5 g/dL (ref 13.5–17.5)
MEAN CORPUSCULAR HEMOGLOBIN: 31.4 pg (ref 26.0–34.0)
MEAN CORPUSCULAR VOLUME: 92.9 fL (ref 80.0–100.0)
MEAN PLATELET VOLUME: 8.2 fL (ref 7.0–10.0)
PLATELET COUNT: 114 10*9/L — ABNORMAL LOW (ref 150–440)
RED BLOOD CELL COUNT: 4.6 10*12/L (ref 4.50–5.90)
RED CELL DISTRIBUTION WIDTH: 14.7 % (ref 12.0–15.0)
WBC ADJUSTED: 6.3 10*9/L (ref 4.5–11.0)

## 2017-04-25 LAB — PHOSPHORUS: Phosphate:MCnc:Pt:Ser/Plas:Qn:: 3.7

## 2017-04-25 LAB — HEMATOCRIT: Lab: 42.7

## 2017-04-25 LAB — MAGNESIUM: Magnesium:MCnc:Pt:Ser/Plas:Qn:: 1.6

## 2017-04-25 NOTE — Unmapped (Signed)
Endocrinology Consult Note   Requesting Attending Physician : Nyra Jabs, MD  Service Requesting Consult : Pulmonology (MDG)  Primary Care Provider: Dellis Filbert, MD  Outpatient Endocrinologist: Walter Denmark, MD (last outpatient visit 11/2015)    IMPRESSION:  Walter [name] is a 36 y.o. male admitted for Alcohol withdrawal (CMS-HCC). I have been asked to evaluate Walter [name] for hyperglycmia. We are consulted for hyperglycemia in CFRD    RECOMMENDATIONS:  1. 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  -- Patient reminded to ask for lispro (5 or 10 units with smaller meals)  -- continue glargine to 34u QAM today  -- continue self managmenet for lispro mealtime up to 15u depending on meal size.  Insulin ordered AC + 2 x daily PRN  -- Will increase up to 18u if continues to have hyperglycemia today  -- continue SSI lispro standard ACHS.    2. Nutrition:  Variable po intake complicating overall glycemic control.     I have communicated recs with primary team.    Patient seen and discussed with Dr. Cory Roughen.     HPI:  Walter [name]??is a 36 y.o.??male??with h/o CF c/b CFRD, alcohol abuse, bipolar disorder admitted for Cystic fibrosis exacerbation (RAF-HCC). I have been asked to evaluate Walter [name]??for CFRD management. At home he is on Lantus 25u qAm and Novolog 4-8u premeal (does not carb count, estimates based on what he's eating and the size of the meal; adds correction but again estimates). Reports checking glucose 6x daily at home.  Did not f/u with endocrine after last admission.  He has lost 3-5 pounds recently      Interval events: Glu157-581.  Patient ate two meals at lunch time including rice, dumplings, egg roll.  In the evening, he had gatorade and chips and did not bolus.           ROS: Per HPI, otherwise remaining of 10 systems negative.    ??? albuterol  2.5 mg Nebulization 4x Daily (RT)   ??? amLODIPine  5 mg Oral Daily   ??? Cefepime  2 g Intravenous Q8H   ??? folic acid  1 mg Oral Daily   ??? gabapentin  400 mg Oral TID   ??? flu vacc [id number] 6mos up(PF)  0.5 mL Intramuscular During hospitalization   ??? insulin glargine  34 Units Subcutaneous Daily   ??? insulin lispro  0-12 Units Subcutaneous ACHS   ??? insulin lispro  0-15 Units Subcutaneous 6x Daily   ??? lipase-protease-amylase  10 capsule Oral 3xd Meals   ??? MVW Complete (pediatric multivit 61-D3-vit K)  2 capsule Oral Daily   ??? OLANZapine  15 mg Oral Nightly   ??? pantoprazole  40 mg Oral BID   ??? phytonadione (vitamin K1)  5 mg Oral Once per day on Mon Thu   ??? polyethylene glycol  17 g Oral Daily   ??? sodium chloride  4 mL Nebulization 4x Daily (RT)   ??? thiamine  100 mg Oral Daily   ??? tobramycin (NEBCIN) IVPB (conventional dosing)  600 mg Intravenous Q24H [facility]       Past Medical History:   Diagnosis Date   ??? Alcohol abuse    ??? Bipolar disorder (CMS-HCC)    ??? Chronic pancreatitis (CMS-HCC)    ??? Chronic sinusitis    ??? 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 Cystic fibrosis (CMS-HCC)    ??? Diabetes mellitus (CMS-HCC)  dx in-house 2015   ??? Kidney stones    ??? Portal hypertension (CMS-HCC)        Family History   Problem Relation Age of Onset   ??? Diabetes Maternal Grandmother    ??? Diabetes Maternal Grandfather    ??? No Known Problems Mother    ??? Cancer Father    ??? No Known Problems Sister        Social History   Substance Use Topics   ??? Smoking status: Former Smoker     Types: Cigarettes   ??? Smokeless tobacco: Never Used   ??? Alcohol use 11.4 oz/week     16 Cans of beer, 3 Glasses of wine per week       PHYSICAL EXAMINATION:  BP 118/80  - Pulse 98  - Temp 37.4 ??C (Oral)  - Resp 17  - Ht 182.8 cm (5' 11.97)  - Wt 74 kg (163 lb 1.6 oz)  - SpO2 95%  - BMI 22.14 kg/m??   Wt Readings from Last 12 Encounters:   04/21/17 74 kg (163 lb 1.6 oz)   02/20/17 75.3 kg (166 lb)   02/10/17 75.7 kg (166 lb 12.8 oz)   12/01/16 72.6 kg (160 lb)   10/09/16 76.2 kg (167 lb 15.9 oz)   08/04/16 77.4 kg (170 lb 10.2 oz)   07/24/16 70.3 kg (155 lb)   06/19/16 80.4 kg (177 lb 4.8 oz)   05/18/16 79.4 kg (175 lb)   04/10/16 79 kg (174 lb 2.6 oz)   03/19/16 69.3 kg (152 lb 11.2 oz)   11/25/15 79.2 kg (174 lb 11.2 oz)     GEN: NAD  Resp: no increased WOB  CV: well perfused  Psych: pleasant    BG/insulin reviewed per EMR.      Summary of labs:    Data Review  Lab Results   Component Value Date    A1C 8.4 (H) 04/24/2017    A1C 10.5 (H) 02/01/2017    A1C 8.2 (H) 12/02/2016     Lab Results   Component Value Date    GFR >= 60 10/31/2010    CREATININE 0.67 (L) 04/24/2017     Lab Results   Component Value Date    WBC 7.0 04/24/2017    HGB 14.3 04/24/2017    HCT 43.2 04/24/2017    PLT 96 (L) 04/24/2017       Lab Results   Component Value Date    NA 135 04/24/2017    K 4.7 04/24/2017    CL 96 (L) 04/24/2017    CO2 25.0 04/24/2017    BUN 20 04/24/2017    CREATININE 0.67 (L) 04/24/2017    GLU 250 (H) 04/24/2017    CALCIUM 9.1 04/24/2017    MG 1.9 04/24/2017    PHOS 5.3 (H) 04/24/2017       Lab Results   Component Value Date    BILITOT 0.7 04/18/2017    BILIDIR 0.20 04/18/2017    PROT 6.3 (L) 04/18/2017    ALBUMIN 3.2 (L) 04/18/2017    ALT 30 04/18/2017    AST 29 04/18/2017    ALKPHOS 183 (H) 04/18/2017    GGT 29 03/27/2016       Lab Results   Component Value Date    INR 0.94 04/21/2017    APTT 31.2 04/21/2017

## 2017-04-25 NOTE — Unmapped (Signed)
Problem: Patient Care Overview  Goal: Plan of Care Review  Outcome: Progressing  Pt. experienced no injury through out the day. Pt. is independent with ADLs and has a steady gait. Afebrile and receiving antibiotics as ordered (please see the VSS and the MARs). Contact precautions active. Pt. Had an elevated blood glucose through out the shift. Team (Med G) continuous to adjust insulin regimen for the pt.(please see the MARs). Pain management achieved by prn pain med as ordered (please see the MARs). Will continue to monitor pt.

## 2017-04-25 NOTE — Unmapped (Signed)
Problem: Patient Care Overview  Goal: Plan of Care Review  Outcome: Progressing  Patient is alert and oriented x3. No CP distress noted. Complain of right abdominal pain with oxycodone po prn. Patient is encourage to change position while in bed. ABT continued without adverse reaction.    04/25/17 0532   OTHER   Plan of Care Reviewed With patient     Goal: Discharge Needs Assessment  Outcome: Progressing   04/25/17 0532   Discharge Needs Assessment   Concerns to be Addressed no discharge needs identified     Goal: Interprofessional Rounds/Family Conf  Outcome: Progressing   04/25/17 0532   Interdisciplinary Rounds/Family Conf   Participants nursing       Problem: Pain, Acute (Adult)  Goal: Identify Related Risk Factors and Signs and Symptoms  Related risk factors and signs and symptoms are identified upon initiation of Human Response Clinical Practice Guideline (CPG).   Outcome: Progressing   04/25/17 0532   Pain, Acute (Adult)   Signs and Symptoms (Acute Pain) facial mask of pain/grimace     Goal: Acceptable Pain Control/Comfort Level  Patient will demonstrate the desired outcomes by discharge/transition of care.   Outcome: Progressing   04/25/17 0532   Pain, Acute (Adult)   Acceptable Pain Control/Comfort Level making progress toward outcome       Problem: Cystic Fibrosis (Adult)  Goal: Signs and Symptoms of Listed Potential Problems Will be Absent, Minimized or Managed (Cystic Fibrosis)  Signs and symptoms of listed potential problems will be absent, minimized or managed by discharge/transition of care (reference Cystic Fibrosis (Adult) CPG).   Outcome: Progressing      Problem: VTE, DVT and PE (Adult)  Goal: Signs and Symptoms of Listed Potential Problems Will be Absent, Minimized or Managed (VTE, DVT and PE)  Signs and symptoms of listed potential problems will be absent, minimized or managed by discharge/transition of care (reference VTE, DVT and PE (Adult) CPG).   Outcome: Progressing

## 2017-04-26 LAB — BASIC METABOLIC PANEL
ANION GAP: 14 mmol/L (ref 9–15)
BLOOD UREA NITROGEN: 19 mg/dL (ref 7–21)
BUN / CREAT RATIO: 26
CALCIUM: 9.5 mg/dL (ref 8.5–10.2)
CHLORIDE: 95 mmol/L — ABNORMAL LOW (ref 98–107)
CO2: 27 mmol/L (ref 22.0–30.0)
EGFR MDRD AF AMER: >=60 mL/min/{1.73_m2} (ref >=60)
EGFR MDRD NON AF AMER: >=60 mL/min/{1.73_m2} (ref >=60)
POTASSIUM: 5 mmol/L (ref 3.5–5.0)
SODIUM: 136 mmol/L (ref 135–145)

## 2017-04-26 LAB — CBC
HEMATOCRIT: 39.9 % — ABNORMAL LOW (ref 41.0–53.0)
HEMOGLOBIN: 13.4 g/dL — ABNORMAL LOW (ref 13.5–17.5)
MEAN CORPUSCULAR HEMOGLOBIN: 31.2 pg (ref 26.0–34.0)
MEAN CORPUSCULAR VOLUME: 92.8 fL (ref 80.0–100.0)
MEAN PLATELET VOLUME: 8 fL (ref 7.0–10.0)
PLATELET COUNT: 148 10*9/L — ABNORMAL LOW (ref 150–440)
RED CELL DISTRIBUTION WIDTH: 14.6 % (ref 12.0–15.0)
WBC ADJUSTED: 6.5 10*9/L (ref 4.5–11.0)

## 2017-04-26 LAB — PHOSPHORUS: Phosphate:MCnc:Pt:Ser/Plas:Qn:: 6 — ABNORMAL HIGH

## 2017-04-26 LAB — TOBRAMYCIN RANDOM
Tobramycin:MCnc:Pt:Ser/Plas:Qn:: 10.4
Tobramycin:MCnc:Pt:Ser/Plas:Qn:: 3

## 2017-04-26 LAB — EGFR MDRD NON AF AMER: Glomerular filtration rate/1.73 sq M.predicted.non black:ArVRat:Pt:Ser/Plas/Bld:Qn:Creatinine-based formula (MDRD): >=60

## 2017-04-26 LAB — MAGNESIUM: Magnesium:MCnc:Pt:Ser/Plas:Qn:: 1.8

## 2017-04-26 LAB — RED BLOOD CELL COUNT: Lab: 4.3 — ABNORMAL LOW

## 2017-04-26 NOTE — Unmapped (Signed)
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 *           Walter [name] is a 36 y.o. male with PMHx as reviewed in the EMR that presented to [name] with Alcohol withdrawal (CMS-HCC).  ??  Alcohol Withdrawal, resolved, h/o Alcohol Use D/O:   - folvite, MV, thiamine, Vit K 5mg  2x/wk  - compazine PRN nausea  - Psychiatry saw patient, looking into rehab options outpatient as below    Substance-induced Depressive Disorder: History of Bipolar I. History of SI but has not endorsed SI during this hospitalization other than passive SI. Psychiatric problems along with alcohol abuse are greatly contributing to his poor health and continued need for hospital care. Patient is also recently homeless.   - Psychiatry consulted, appreciate recs  --- continue Zyprexa 15mg  QHS  --- DO NOT restart Zoloft  --- looking into outpatient rehab options near [facility] and will trial Naltrexone upon D/C    CFrDM: sugars continue to be poorly controlled despite starting home insulin.  --- Endocrinology consulted; recs appreciated  --- Lantus to 34 units daily   --- Lispro SSI  --- Meal time and snack carb counting insulin    CF Pulmonary Exacerbation: RVP negative. Repeat PFTs improved from prior w/ FEV1 of 1.75L (37.8% predicted) from previous 1.24L (26% predicted) on 8/30.  - IV Tobramycin (11/4-), Cefepime (11/5-)  - duonebs, hypertonic 3%, Chest vest per RT  - CF Culture with mPsA, f/u sensitivities    Hypertension: new dx  - Continue amlodipine 5mg  daily    Thrombocytopenia, improving: Most likely worsened by alcohol use (possible bone marrow suppression), typically ~150-200 even with baseline cirrhosis, improving, now up to 114.   - restarted lovenox    Abdominal Pain, diarrhea, improving: in setting of binge drinking, chronic pancreatitis, & possible gastritis 2/2 alcohol. May also have constipation with overflow as KUB on admission showed moderate colonic stool burden. 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 Stools more solid with less abdominal pain  - daily Miralax (Encouraged compliance)  - PRN tylenol   - Zenpep    Hx of severe protein-calorie malnutrition, Vitamin D Deficiency: Binge drinking last two weeks before admission, not eating much at all. Will monitor for refeeding. 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 ??  H/o Opiate Use D/O: CTM  ??  Diet: Regular  DVT PPx: restarted lovenox  GI PPx: protonix  Code Status: FULL  Dispo: Med G  ___________________________________________________________________    Subjective:  No acute events overnight. He continues to feel better overall, improved nausea and dyspnea, still has some abdominal pain. He talked with psychiatry yesterday and they are helping him get in contact with outpatient rehab options near [place].  Labs/Studies:  Labs and Studies from the last 24hrs per EMR and Reviewed    Physical Exam:  Vitals:    04/25/17 1345   BP: 118/80   Pulse: 98   Resp: 17   Temp: 37.4 ??C   SpO2: 95%     General: resting in bed, disheveled  HEENT: atraumatic, normocephalic, EOMI  Cardiac: RRR, normal S1 and S2  Pulmonary: CTAB, no accessory muscle use or excess work of breathing  Abdomen: soft, ND, normoactive bowel sounds  Extremities: warm, 2+ DP and radial pulses

## 2017-04-26 NOTE — Unmapped (Signed)
No acute events throughout the day.  Patient slept the entire shift only awaking to receive medications of his choice.  Patient refused lovenox, insulin, & breathing treatments.  IV abx administetred as ordered.  Mild abdominal pain endorsed. VSS.

## 2017-04-26 NOTE — Unmapped (Signed)
Patient refused all treatments today.

## 2017-04-26 NOTE — Unmapped (Signed)
Problem: Patient Care Overview  Goal: Plan of Care Review  Outcome: Progressing  Patient tolerated treatments and medications well with nae.  Vs remain wnl.  Pain relieved by prn pain medications.  Ambulated in hallway x2.  Very pleasant. No ss of etoh wd.  Will continue to monitor.   Goal: Individualization and Mutuality  Outcome: Progressing    Goal: Discharge Needs Assessment  Outcome: Progressing   04/19/17 0922 04/24/17 1723 04/25/17 0532   OTHER   Equipment Currently Used at Home --  respiratory supplies --    Patient and/or family were provided with choice of facilities / services that are available and appropriate to meet post hospital care needs? --  N/A --    Discharge Needs Assessment   Concerns to be Addressed --  --  no discharge needs identified   Readmission Within the Last 30 Days --  no previous admission in last 30 days --    Patient/Family Anticipates Transition to --  home --    Patient/Family Anticipated Services at Transition --  none --    Transportation Anticipated --  family or friend will provide --    Anticipated Changes Related to Illness none --  --    Equipment Needed After Discharge --  none --    Discharge Facility/Level of Care Needs (home to self care) --  --    Current Discharge Risk --  chronically ill --      Goal: Interprofessional Rounds/Family Conf  Outcome: Progressing   04/19/17 1242 04/25/17 0532   OTHER   Clinical EDD (Estimated Discharge Date)  05/02/17 --    Interdisciplinary Rounds/Family Conf   Participants --  nursing       Problem: Alcohol Withdrawal Acute, Risk/Actual (Adult)  Intervention: Support/Optimize Psychosocial Response to Withdrawal Process   04/24/17 1723   Interventions   Supportive Measures active listening utilized;counseling provided;decision-making supported;self-responsibility promoted;relaxation techniques promoted;self-reflection promoted;verbalization of feelings encouraged   Environmental Support calm environment promoted;caregiver consistency promoted;distractions minimized;environmental consistency promoted;rest periods encouraged   Sleep/Rest Enhancement awakenings minimized;consistent schedule promoted;regular sleep/rest pattern promoted;relaxation techniques promoted       Goal: Signs and Symptoms of Listed Potential Problems Will be Absent, Minimized or Managed (Alcohol Withdrawal Acute, Risk/Actual)  Signs and symptoms of listed potential problems will be absent, minimized or managed by discharge/transition of care (reference Alcohol Withdrawal Acute, Risk/Actual (Adult) CPG).   Outcome: Progressing

## 2017-04-26 NOTE — Unmapped (Signed)
Endocrinology Consult Note   Requesting Attending Physician : Nyra Jabs, MD  Service Requesting Consult : Pulmonology (MDG)  Primary Care Provider: Dellis Filbert, MD  Outpatient Endocrinologist: Jackquline Denmark, MD (last outpatient visit 11/2015)    IMPRESSION:  Walter [name] is a 36 y.o. male admitted for Alcohol withdrawal (CMS-HCC). I have been asked to evaluate Walter [name] for hyperglycmia. We are consulted for hyperglycemia in CFRD    RECOMMENDATIONS:  1. 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  Some improvement in glucose with getting insulin for midnight meal.  Still above goal for the most part.  -- continue glargine to 40u QAM  -- continue self managmenet for lispro mealtime up to 18u depending on meal size.  Insulin ordered AC + 2 x daily PRN  -- continue SSI lispro standard ACHS.    2. Nutrition:  Variable po intake complicating overall glycemic control.     I have communicated recs with primary team.    Patient seen and discussed with Dr. Cory Roughen.     HPI:  Walter [name]??is a 36 y.o.??male??with h/o CF c/b CFRD, alcohol abuse, bipolar disorder admitted for Cystic fibrosis exacerbation (RAF-HCC). I have been asked to evaluate Walter [name]??for CFRD management. At home he is on Lantus 25u qAm and Novolog 4-8u premeal (does not carb count, estimates based on what he's eating and the size of the meal; adds correction but again estimates). Reports checking glucose 6x daily at home.  Did not f/u with endocrine after last admission.  He has lost 3-5 pounds recently      Interval events: Got insulin with every meal.  Glu range 137-408.  94u of lispro yesterday.    ROS: Per HPI, otherwise remaining of 10 systems negative.    ??? albuterol  2.5 mg Nebulization 4x Daily (RT)   ??? amLODIPine  5 mg Oral Daily   ??? Cefepime  2 g Intravenous Q8H   ??? enoxaparin (LOVENOX) injection  40 mg Subcutaneous Q24H [facility]   ??? folic acid  1 mg Oral Daily ??? gabapentin  400 mg Oral TID   ??? flu vacc [id number] 6mos up(PF)  0.5 mL Intramuscular During hospitalization   ??? [START ON 04/27/2017] insulin glargine  40 Units Subcutaneous Daily   ??? insulin lispro  0-12 Units Subcutaneous ACHS   ??? insulin lispro  0-18 Units Subcutaneous 6x Daily   ??? lipase-protease-amylase  10 capsule Oral 3xd Meals   ??? MVW Complete (pediatric multivit 61-D3-vit K)  2 capsule Oral Daily   ??? OLANZapine  15 mg Oral Nightly   ??? pantoprazole  40 mg Oral BID   ??? phytonadione (vitamin K1)  5 mg Oral Once per day on Mon Thu   ??? polyethylene glycol  17 g Oral Daily   ??? sodium chloride  4 mL Nebulization 4x Daily (RT)   ??? thiamine  100 mg Oral Daily   ??? tobramycin (NEBCIN) IVPB (conventional dosing)  600 mg Intravenous Q24H [name]       Past Medical History:   Diagnosis Date   ??? Alcohol abuse    ??? Bipolar disorder (CMS-HCC)    ??? Chronic pancreatitis (CMS-HCC)    ??? Chronic sinusitis    ??? 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 Cystic fibrosis (CMS-HCC)    ??? Diabetes mellitus (CMS-HCC)     dx in-house 2015   ??? Kidney stones    ??? Portal hypertension (  CMS-HCC)        Family History   Problem Relation Age of Onset   ??? Diabetes Maternal Grandmother    ??? Diabetes Maternal Grandfather    ??? No Known Problems Mother    ??? Cancer Father    ??? No Known Problems Sister        Social History   Substance Use Topics   ??? Smoking status: Former Smoker     Types: Cigarettes   ??? Smokeless tobacco: Never Used   ??? Alcohol use 11.4 oz/week     16 Cans of beer, 3 Glasses of wine per week       PHYSICAL EXAMINATION:  BP 121/80  - Pulse 82  - Temp 35.9 ??C (Oral)  - Resp 18  - Ht 182.8 cm (5' 11.97)  - Wt 74 kg (163 lb 1.6 oz)  - SpO2 92%  - BMI 22.14 kg/m??   Wt Readings from Last 12 Encounters:   04/21/17 74 kg (163 lb 1.6 oz)   02/20/17 75.3 kg (166 lb)   02/10/17 75.7 kg (166 lb 12.8 oz)   12/01/16 72.6 kg (160 lb)   10/09/16 76.2 kg (167 lb 15.9 oz)   08/04/16 77.4 kg (170 lb 10.2 oz)   07/24/16 70.3 kg (155 lb) 06/19/16 80.4 kg (177 lb 4.8 oz)   05/18/16 79.4 kg (175 lb)   04/10/16 79 kg (174 lb 2.6 oz)   03/19/16 69.3 kg (152 lb 11.2 oz)   11/25/15 79.2 kg (174 lb 11.2 oz)     GEN: NAD  Resp: no increased WOB  CV: well perfused  Psych: pleasant    BG/insulin reviewed per EMR.      Summary of labs:    Data Review  Lab Results   Component Value Date    A1C 8.4 (H) 04/24/2017    A1C 10.5 (H) 02/01/2017    A1C 8.2 (H) 12/02/2016     Lab Results   Component Value Date    GFR >= 60 10/31/2010    CREATININE 0.73 04/26/2017     Lab Results   Component Value Date    WBC 6.5 04/26/2017    HGB 13.4 (L) 04/26/2017    HCT 39.9 (L) 04/26/2017    PLT 148 (L) 04/26/2017       Lab Results   Component Value Date    NA 136 04/26/2017    K 5.0 04/26/2017    CL 95 (L) 04/26/2017    CO2 27.0 04/26/2017    BUN 19 04/26/2017    CREATININE 0.73 04/26/2017    GLU 329 (H) 04/26/2017    CALCIUM 9.5 04/26/2017    MG 1.8 04/26/2017    PHOS 6.0 (H) 04/26/2017       Lab Results   Component Value Date    BILITOT 0.7 04/18/2017    BILIDIR 0.20 04/18/2017    PROT 6.3 (L) 04/18/2017    ALBUMIN 3.2 (L) 04/18/2017    ALT 30 04/18/2017    AST 29 04/18/2017    ALKPHOS 183 (H) 04/18/2017    GGT 29 03/27/2016       Lab Results   Component Value Date    INR 0.94 04/21/2017    APTT 31.2 04/21/2017

## 2017-04-26 NOTE — Unmapped (Signed)
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 *           Walter [name] is a 36 y.o. male with PMHx as reviewed in the EMR that presented to [facility] with Alcohol withdrawal (CMS-HCC).  ??  Alcohol Withdrawal, resolved, h/o Alcohol Use D/O:   - folvite, MV, thiamine, Vit K 5mg  2x/wk  - compazine PRN nausea  - Psychiatry has seen patient and provided resources as below, SW to see patient today    Substance-induced Depressive Disorder: History of Bipolar I. History of SI but has not endorsed SI during this hospitalization other than passive SI. Psychiatric problems along with alcohol abuse are greatly contributing to his poor health and continued need for hospital care. Patient is also recently homeless.   - Psychiatry consulted and now signed off  --- continue Zyprexa 15mg  QHS  --- DO NOT restart Zoloft  --- patient prefers to follow with outpatient psychiatry in [place] closer to home, recommend trial of naltrexone, establish care by contacting either their insurance carrier or their local Managed Care Organization, [employer] [phone]).??  --- given resources for outpatient rehab (not interested in inpatient or 12 step program)  --- Upon discharge patient would likely benefit from finding a therapist; patient can find information about therapists in the area using www.psychologytoday.com, which can be provided in a discharge summary.    CFrDM: sugars continue to be poorly controlled despite starting home insulin.  --- Endocrinology consulted; recs appreciated  --- increased Lantus to 40 units daily   --- Lispro SSI  --- Meal time and snack carb counting insulin (increased to 0-18U)  --- Upon discharge, keep with same regimen as in hospital if possible, but if decreasing intake, decrease long-acting by 20%, only cover however much he is eating with lispro; if becoming hypoglycemic decrease by another 20%    CF Pulmonary Exacerbation: RVP negative. Repeat PFTs improved from prior w/ FEV1 of 1.75L (37.8% predicted) from previous 1.24L (26% predicted) on 8/30.  - IV Tobramycin (11/4-), Cefepime (11/5-)  - duonebs, hypertonic 3%, Chest vest per RT  - CF Culture with mPsA, f/u sensitivities    Hypertension: new dx  - Continue amlodipine 5mg  daily    Thrombocytopenia, improving: Most likely worsened by alcohol use (possible bone marrow suppression), typically ~150-200 even with baseline cirrhosis, improving, now up to 148    Abdominal Pain, diarrhea, improving: in setting of binge drinking, chronic pancreatitis, & possible gastritis 2/2 alcohol. May also have constipation with overflow as KUB on admission showed moderate colonic stool burden. 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 Stools more solid with less abdominal pain  - daily Miralax (Encouraged compliance)  - PRN tylenol   - Zenpep    Hx of severe protein-calorie malnutrition, Vitamin D Deficiency: Binge drinking last two weeks before admission, not eating much at all. Will monitor for refeeding. 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 ??  H/o Opiate Use D/O: CTM  ??  Diet: Regular  DVT PPx: lovenox  GI PPx: protonix  Code Status: FULL  Dispo: Med G  ___________________________________________________________________    Subjective:  Sleeping in bed, asked not do be disturbed this afternoon and not to be examined.    Labs/Studies:  Labs and Studies from the last 24hrs per EMR and Reviewed    Physical Exam:  Vitals:    04/26/17 1207   BP: 121/80   Pulse: 82   Resp: 18   Temp: 35.9 ??C   SpO2: 92%     General: sleeping in bed but awakens and is alert, disheveled  Cardiac: regular rate  Pulmonary: no accessory muscle use or excess work of breathing, resting comfortably on room air  Neuro: alert, moving all four extremities

## 2017-04-26 NOTE — Unmapped (Signed)
Brief Psychiatry Consult Note    The patient was last seen by the psychiatry service on 04/24/17. Interim documentation by primary team and nursing staff has been reviewed. At this time, there is no evidence of acute psychiatric disturbance requiring ongoing psychiatric consultation. Resources for outpatient substance abuse and mental health treatment has placed in the patient's discharge instructions.    We will sign off at this time. This has been communicated to the primary team. If issues arise in the future, don't hesitate to contact the Psychiatry Inpatient Consult Service (pager [phone]).     Purcell Nails, MD    I was available. [name], MD

## 2017-04-26 NOTE — Unmapped (Signed)
Problem: Patient Care Overview  Goal: Plan of Care Review  Outcome: Progressing   04/25/17 1851   OTHER   Plan of Care Reviewed With patient   Plan of Care Review   Progress improving     Patient AOX4. Pt blood sugars have remained high today, patient received long acting, sliding scale, and prandial insulin. Pt c/o abdominal pain relieved by prn pain medication. Patient received IV abx and tolerated them well. Will continue POC.     Problem: Alcohol Withdrawal Acute, Risk/Actual (Adult)  Goal: Signs and Symptoms of Listed Potential Problems Will be Absent, Minimized or Managed (Alcohol Withdrawal Acute, Risk/Actual)  Signs and symptoms of listed potential problems will be absent, minimized or managed by discharge/transition of care (reference Alcohol Withdrawal Acute, Risk/Actual (Adult) CPG).    04/25/17 1851   Alcohol Withdrawal Acute, Risk/Actual (Adult)   Problems Assessed (Alcohol Withdrawal Syndrome) all   Problems Present (Alcohol W/D Syndrome) none       Problem: Pain, Acute (Adult)  Goal: Identify Related Risk Factors and Signs and Symptoms  Related risk factors and signs and symptoms are identified upon initiation of Human Response Clinical Practice Guideline (CPG).   Outcome: Progressing   04/25/17 1851   Pain, Acute (Adult)   Related Risk Factors (Acute Pain) disease process     Goal: Acceptable Pain Control/Comfort Level  Patient will demonstrate the desired outcomes by discharge/transition of care.   Outcome: Progressing   04/25/17 1851   Pain, Acute (Adult)   Acceptable Pain Control/Comfort Level making progress toward outcome       Problem: Cystic Fibrosis (Adult)  Goal: Signs and Symptoms of Listed Potential Problems Will be Absent, Minimized or Managed (Cystic Fibrosis)  Signs and symptoms of listed potential problems will be absent, minimized or managed by discharge/transition of care (reference Cystic Fibrosis (Adult) CPG).   Outcome: Progressing   04/25/17 1851   Cystic Fibrosis (Adult) Problems Assessed (Cystic Fibrosis) all   Problems Present (Cystic Fibrosis) infection;impaired glycemic control;situational response       Problem: VTE, DVT and PE (Adult)  Goal: Signs and Symptoms of Listed Potential Problems Will be Absent, Minimized or Managed (VTE, DVT and PE)  Signs and symptoms of listed potential problems will be absent, minimized or managed by discharge/transition of care (reference VTE, DVT and PE (Adult) CPG).   Outcome: Progressing   04/25/17 1851   VTE, DVT and PE (Adult)   Problems Assessed (VTE, DVT, PE) all   Problems Present (VTE, DVT, PE) none

## 2017-04-27 NOTE — Unmapped (Signed)
Physician Discharge Summary    Identifying Information:   Walter [name]  1981/03/08  [id number]    Admit date: 04/18/2017    Discharge date: 04/27/2017     Discharge Service: Pulmonology (MDG)    Discharge Attending Physician: No att. 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 *      Outpatient Provider Follow Up Issues:   - F/u glycemic control with compliance to outpatient DM regimen.  - Recommend outpatient vs. Inpatient substance use counseling.     Hospital Course:   [name] is a 36 y.o. 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 Had recently been on a 2 week drinking binge after leaving a rehab facility.    Discharged Against Medical Advice: Overnight on 11/12, patient was found to not be in his room. Patient was unable to be contacted. His mother's cell-phone was contacted to inform her. Patient was deemed to have left the hospital against medical advice. His IV tubing and peripheral IV were noted to be discarded in trash can.     Alcohol Withdrawal, resolved, h/o Alcohol Use D/O:  He was able to be managed on the floor with CIWA protocol and PRN ativan. Main symptoms were nausea, tremors, headache, and hypertension. After a few days, ativan was weaned and some symptoms were thought to be unrelated to withdrawal. He did not hallucinate or have seizures and has no history of DTs/seizures/intubation. He was seen by nutrition and we continued folvite, MV, thiamine, Vit K 5mg  2x/wk. We gave compazine PRN nausea and though his headaches may have been secondary to caffeine withdrawal. He had a diagnosis of essential hypertension from 2004 but did not recall this, however it was thought he is likely hypertensive at baseline as he remained hypertensive for the first few days of admission. Psychiatry came to see the patient on 04/24/17 to provide resources for rehabilitation.   ??  Substance-induced Depressive Disorder: History of Bipolar I. History of SI but has not endorsed SI during this hospitalization other than passive SI. Psychiatric problems along with alcohol abuse are greatly contributing to his poor health and continued need for hospital care. Patient is also recently homeless. Psychiatry was consulted and agreed with continuing home Zyprexa and not restarting Zoloft.   - Zyprexa 15mg  QHS  - Psych Consulted  ??  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 He had not been eating the 2 weeks prior to admission and ate ravenously in the hospital. Endocrinology was consulted to help control his sugars. Prior to leaving AMA, he received lantus 40 units, lispro 0-18 units with meals pending meal size.  ??  CF Pulmonary Exacerbation:??RVP, RSV, flu negative. Repeat PFTs improved from prior w/ FEV1 of 1.75L (37.8% predicted) from previous 1.24L (26% predicted) on 8/30. Received Tobra (11/4 - 11/12), & cefepime (11/5 - 11/12)  ??  Hypertension: New diagnosis. Received amlodipine 5 mg daily  while admitted.  ??  Thrombocytopenia, improving: Most likely worsened by alcohol use (possible bone marrow suppression), typically ~150-200 even with baseline cirrhosis, improving, now up to 114 by time of discharge  ??  Abdominal Pain, diarrhea, improving: in setting of binge drinking, chronic pancreatitis, & possible gastritis 2/2 alcohol. 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 Stools more solid with less abdominal pain. Has improved with PO intake.  ??  Hx of severe protein-calorie malnutrition, Vitamin D Deficiency: Binge drinking last two weeks before admission, not eating much at all. Will monitor for refeeding. Regular diet as tolerated.    ??  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 ??  H/o Opiate Use D/O: CTM    Outpatient Follow Up  - Endocrine Recs:  --- For sugars as outpatient, keep with same regimen as in hospital if possible, but if decreasing intake, decrease long-acting by 20%, only cover however much he is eating with lispro; if becoming hypoglycemic decrease by another 20%    - Psychiatry recs:  --- Naltrexone outpatient per psychiatry, will follow with outpatient psychiatry in [facility]  --- While the patient is not currently receiving outpatient mental health care, we recommended to them that they establish care by contacting either their insurance carrier or their local Managed Care Organization, [employer] [phone]).   --- Upon discharge patient would likely benefit from finding a therapist; patient can find information about therapists in the area using www.psychologytoday.com, which can be provided in a discharge summary.    Procedures:  No admission procedures for hospital encounter.  ______________________________________________________________________    Discharge Day Services:  BP 148/98  - Pulse 92  - Temp 37.7 ??C (Oral)  - Resp 20  - Ht 182.8 cm (5' 11.97)  - Wt 74 kg (163 lb 1.6 oz)  - SpO2 94%  - BMI 22.14 kg/m??   Pt seen on the day of discharge and determined appropriate for discharge.    Condition at Discharge: fair  ______________________________________________________________________  Discharge Medications:     Your Medication List      ASK your doctor about these medications    albuterol 90 mcg/actuation inhaler  Commonly known as:  PROVENTIL HFA;VENTOLIN HFA  Inhale 2 puffs every six (6) hours as needed for wheezing.     albuterol 2.5 mg /3 mL (0.083 %) nebulizer solution  Inhale 3 mL (2.5 mg total) by nebulization 4 (four) times a day.     calcium carbonate 600 mg calcium (1,500 mg) tablet  Commonly known as:  OS-CAL  Take 1 tablet (600 mg of elem calcium total) by mouth daily.     folic acid 1 MG tablet  Commonly known as:  FOLVITE  Take 1 tablet (1 mg total) by mouth daily.     gabapentin 400 MG capsule  Commonly known as:  NEURONTIN  Take 1 capsule (400 mg total) by mouth Three (3) times a day.     ibuprofen 200 MG tablet  Commonly known as:  ADVIL,MOTRIN  Take 800 mg by mouth daily.     insulin ASPART 100 unit/mL injection pen  Commonly known as:  NovoLOG FLEXPEN  Inject 0.08-0.12 mL (8-12 Units total) under the skin Three (3) times a day before meals.     insulin glargine 100 unit/mL injection  Commonly known as:  LANTUS  Inject 0.2 mL (20 Units total) under the skin  daily.     ketoconazole 2 % cream  Commonly known as:  NIZORAL  Apply 1 application topically daily.     lipase-protease-amylase 40,000-126,000- 168,000 unit Cpdr capsule, delayed release  Commonly known as:  ZENPEP  Take 6 capsules by mouth Three (3) times a day. 3 capsules with snacks three times a day     MVW Complete (pediatric multivit 61-D3-vit K) 1,500-800 unit-mcg Cap  Take 2 capsules by mouth daily.     OLANZapine 15 MG tablet  Commonly known as:  ZyPREXA  Take 1 tablet (15 mg total) by mouth nightly.     pantoprazole 40 MG tablet  Commonly known as:  PROTONIX  Take 40 mg by mouth Two (2) times a day.     sertraline 100 MG tablet  Commonly known as:  ZOLOFT  Take 2 tablets (200 mg total) by mouth daily.     sodium chloride 7% 7 % Nebu  Inhale 4 mL by nebulization QID.     thiamine 100 MG tablet  Commonly known as:  B-1  Take 1 tablet (100 mg total) by mouth daily.          ______________________________________________________________________  Pending Test Results (if blank, then none):   Order Current Status    Blood Culture, Adult Collected (04/18/17 0902)    CF Sputum/ CF Sinus Culture Preliminary result          Most Recent Labs:  Microbiology Results (last day)     ** No results found for the last 24 hours. **          Lab Results   Component Value Date    WBC 6.5 04/26/2017    HGB 13.4 (L) 04/26/2017    HCT 39.9 (L) 04/26/2017    PLT 148 (L) 04/26/2017       Lab Results   Component Value Date    NA 136 04/26/2017    K 5.0 04/26/2017    CL 95 (L) 04/26/2017    CO2 27.0 04/26/2017    BUN 19 04/26/2017    CREATININE 0.73 04/26/2017    CALCIUM 9.5 04/26/2017    MG 1.8 04/26/2017    PHOS 6.0 (H) 04/26/2017       Lab Results   Component Value Date    ALKPHOS 183 (H) 04/18/2017    BILITOT 0.7 04/18/2017    BILIDIR 0.20 04/18/2017    PROT 6.3 (L) 04/18/2017    ALBUMIN 3.2 (L) 04/18/2017    ALT 30 04/18/2017    AST 29 04/18/2017    GGT 29 03/27/2016       Lab Results   Component Value Date    PT 10.7 04/21/2017    INR 0.94 04/21/2017    APTT 31.2 04/21/2017     Hospital Radiology:  Xr Chest 2 Views    Result Date: 04/18/2017  EXAM: XR CHEST 2 VIEWS DATE: 04/18/2017 6:35 AM ACCESSION: [id number] UN DICTATED: 04/18/2017 6:51 AM INTERPRETATION LOCATION: [facility] CLINICAL INDICATION: 36 years old Male with DYSPNEA--  COMPARISON: CXR 02/20/2017 and earlier. CTA chest 11/30/2016. TECHNIQUE: PA and Lateral Chest Radiographs. FINDINGS: Lungs are well-inflated. Sequelae of cystic fibrosis. Multifocal reticulonodular opacities, which appear similar to prior chest radiograph. No new or enlarging airspace opacities. No pneumothorax or sizable pleural effusion. Stable cardiomediastinal silhouette. No acute osseous abnormality.      Unchanged multifocal reticulonodular opacities, favored represent chronic sequelae of cystic fibrosis.     Xr Abdomen 1 View    Result Date: 04/18/2017  EXAM: XR ABDOMEN 1 VIEW DATE: 04/18/2017 9:16 AM ACCESSION: [id number] UN DICTATED: 04/18/2017 9:17 AM INTERPRETATION LOCATION: [facility] CLINICAL INDICATION: 36 years old Male with ABDOMINAL PAIN  UNSPECIFIED SITE--  COMPARISON: 11/30/2016 KUB TECHNIQUE: Supine view of the abdomen. FINDINGS: Nonobstructive bowel gas pattern. Right upper quadrant cholecystectomy clips. Moderate amount of colonic stool. The bones are unremarkable for age. Lung bases are clear.      Nonobstructive bowel gas pattern.      ______________________________________________________________________                  Length of Discharge: I spent greater than 30 mins in the discharge of this patient.

## 2017-04-27 NOTE — Unmapped (Signed)
Covering SW for ongoing CF outpt SW, Serita Grit, LCSW, today reviewed chart prior to visiting 6[facility] to learn that pt left inpatient AMA late yesterday. Pt was admitted on Nov 4 for both ETOH detox as well as CF exacerbation and was to have remained inpt for several more days of IV antibiotics.    (Covering SW had earlier responded last week to inbasket tc messages from CF RN's Sallie and Joni Reining re: tc they had received from pt's mother with her concerns for pt's status presently.  SW suggested and subsequently forwarded these inbasket messages to inpt CCM's CM- Clinical Care Management's Case Manager - Marcellus Scott - who later informed CF SW colleague, Mercy Moore, LCSW, that she was making a referral to inpt CM SW, Kathee Polite, LCSW, for f/u on these concerns.)    Per chart review today, ascertained that Kathee Polite, LCSW, had contacted pt yesterday and was planning to visit again with him today after receiving listing of inpt and outpt MH/SA (Mental Health/Substance Abuse) resources that his insurance would cover (likely from CCM CMA [facility]' - referral who would have researched these options and given a list to inpt SW).     Also per chart review, [facility] had signed off on pt's care yesterday- stating that they would be placing MH/SA resources as well in pt's discharge summary.  (Per earlier inpt psych consult note, pt was only interested in outpt treatment program in [place].) Pt also informed inpt CM SW that he would be going back to his roommates' home following his inpt discharge and did not understand any other concerns to the contrary.    Today, covering SW sent EPIC inbasket message to inpt CM SW, Kathee Polite, LCSW, with cc's to others on outpt CF team (SW colleague, Mercy Moore, LCSW; RN, Norlene Duel; and MD, Dr Lurena Nida) to request that when inpt CM SW receives listing of covered outpt treatment programs - preferably in [place] per pt's desire - she forward these resources to [facility] outpt team such that they can be given and reviewed with pt.  Will also collaborate with outpt CF SW colleague, Mercy Moore, LCSW, on this case later in the week.

## 2017-05-13 MED ORDER — LIPASE-PROTEASE-AMYLASE 40,000-126,000-168,000 UNIT CAPSULE, DELAY REL
ORAL_CAPSULE | 11 refills | 0 days | Status: SS
Start: 2017-05-13 — End: 2017-07-07

## 2017-05-14 NOTE — Unmapped (Signed)
Adult Cystic Fibrosis Clinic Pharmacist Note: Medication Management    [facility] LVM on CF nurse line asking for RX for Zenpep to reflect how many caps per day.  Resent RX for Zenpep 40,000 (total daily 27 caps)      Anell Barr, PharmD, BCPS, CPP  Clinical Pharmacist Practitioner  [facility] / [facility]  Pager: [phone]      CC: Walter [name]

## 2017-05-18 NOTE — Unmapped (Signed)
Disenrolled from [facility], RF coordination calls.  [facility] has been unable to reach patient since last refill of Pulmozyme in 11/27/16.    Anell Barr, PharmD, BCPS, CPP  Clinical Pharmacist Practitioner  [facility]yst[facility] Fib[facility] / [facility]  Pager: [phone]

## 2017-05-31 MED ORDER — PANTOPRAZOLE 40 MG TABLET,DELAYED RELEASE
ORAL_TABLET | Freq: Two times a day (BID) | ORAL | 2 refills | 0.00000 days | Status: CP
Start: 2017-05-31 — End: 2018-08-10

## 2017-06-01 MED ORDER — GABAPENTIN 400 MG CAPSULE
ORAL_CAPSULE | Freq: Three times a day (TID) | ORAL | 0 refills | 0 days | Status: SS
Start: 2017-06-01 — End: 2017-07-09

## 2017-06-05 ENCOUNTER — Inpatient Hospital Stay
Admission: EM | Admit: 2017-06-05 | Discharge: 2017-06-23 | Disposition: A | Discharge status: Home / Self Care | Payer: BC Managed Care – PPO | Source: Intra-hospital

## 2017-06-05 ENCOUNTER — Ambulatory Visit
Admit: 2017-06-05 | Discharge: 2017-06-23 | Disposition: A | Discharge status: Home / Self Care | Payer: PRIVATE HEALTH INSURANCE | Admitting: Pulmonary Disease

## 2017-06-05 DIAGNOSIS — S3210XA Unspecified fracture of sacrum, initial encounter for closed fracture: Principal | ICD-10-CM

## 2017-06-07 DIAGNOSIS — S3210XA Unspecified fracture of sacrum, initial encounter for closed fracture: Principal | ICD-10-CM

## 2017-06-18 DIAGNOSIS — S3210XA Unspecified fracture of sacrum, initial encounter for closed fracture: Principal | ICD-10-CM

## 2017-06-22 MED ORDER — INSULIN ASPART (U-100) 100 UNIT/ML (3 ML) SUBCUTANEOUS PEN
Freq: Three times a day (TID) | SUBCUTANEOUS | 0 refills | 0 days | Status: SS
Start: 2017-06-22 — End: 2018-02-04

## 2017-06-22 MED ORDER — INSULIN GLARGINE (U-100) 100 UNIT/ML SUBCUTANEOUS SOLUTION
Freq: Every day | SUBCUTANEOUS | 0 refills | 0.00000 days | Status: SS
Start: 2017-06-22 — End: 2017-07-07

## 2017-06-22 MED ORDER — THIAMINE HCL (VITAMIN B1) 100 MG TABLET
ORAL_TABLET | Freq: Every day | ORAL | 1 refills | 0 days | Status: CP
Start: 2017-06-22 — End: 2017-08-21

## 2017-06-22 MED ORDER — OLANZAPINE 15 MG TABLET
ORAL_TABLET | Freq: Every evening | ORAL | 1 refills | 0.00000 days | Status: CP
Start: 2017-06-22 — End: 2017-09-14

## 2017-06-22 MED ORDER — URSODIOL 300 MG CAPSULE
ORAL_CAPSULE | Freq: Two times a day (BID) | ORAL | 1 refills | 0 days | Status: CP
Start: 2017-06-22 — End: 2017-08-21

## 2017-06-22 MED ORDER — CHOLECALCIFEROL (VITAMIN D3) 25 MCG (1,000 UNIT) TABLET
ORAL_TABLET | Freq: Every day | ORAL | 1 refills | 0.00000 days | Status: SS
Start: 2017-06-22 — End: 2017-07-06

## 2017-06-23 MED ORDER — MAGNESIUM OXIDE 400 MG (241.3 MG MAGNESIUM) TABLET
ORAL_TABLET | Freq: Every day | ORAL | 11 refills | 0.00000 days | Status: SS
Start: 2017-06-23 — End: 2017-08-31

## 2017-06-30 ENCOUNTER — Encounter
Admit: 2017-06-30 | Discharge: 2017-06-30 | Disposition: A | Discharge status: Home / Self Care | Payer: PRIVATE HEALTH INSURANCE

## 2017-06-30 DIAGNOSIS — M545 Low back pain: Secondary | ICD-10-CM

## 2017-06-30 DIAGNOSIS — M79642 Pain in left hand: Principal | ICD-10-CM

## 2017-06-30 MED ORDER — OXYCODONE-ACETAMINOPHEN 5 MG-325 MG TABLET
ORAL_TABLET | ORAL | 0 refills | 0.00000 days | Status: CP | PRN
Start: 2017-06-30 — End: 2017-07-07

## 2017-07-02 ENCOUNTER — Ambulatory Visit
Admit: 2017-07-02 | Discharge: 2017-07-12 | Disposition: A | Discharge status: Home / Self Care | Payer: PRIVATE HEALTH INSURANCE

## 2017-07-09 MED ORDER — GABAPENTIN 300 MG CAPSULE
ORAL_CAPSULE | Freq: Three times a day (TID) | ORAL | 1 refills | 0.00000 days | Status: CP
Start: 2017-07-09 — End: 2017-09-14

## 2017-07-09 MED ORDER — OXYCODONE 5 MG TABLET
ORAL_TABLET | Freq: Four times a day (QID) | ORAL | 0 refills | 0 days | Status: CP | PRN
Start: 2017-07-09 — End: 2017-07-14

## 2017-07-09 MED ORDER — ESCITALOPRAM 10 MG TABLET
ORAL_TABLET | 3 refills | 0 days | Status: CP
Start: 2017-07-09 — End: 2017-07-12

## 2017-07-09 MED ORDER — CHOLECALCIFEROL (VITAMIN D3) 25 MCG (1,000 UNIT) TABLET
ORAL_TABLET | Freq: Every day | ORAL | 1 refills | 0 days | Status: SS
Start: 2017-07-09 — End: 2017-08-31

## 2017-07-09 MED ORDER — FOLIC ACID 1 MG TABLET
ORAL_TABLET | Freq: Every day | ORAL | 1 refills | 0.00000 days | Status: SS
Start: 2017-07-09 — End: 2017-08-31

## 2017-07-09 MED ORDER — INSULIN GLARGINE (U-100) 100 UNIT/ML SUBCUTANEOUS SOLUTION
Freq: Every day | SUBCUTANEOUS | 1 refills | 0 days | Status: SS
Start: 2017-07-09 — End: 2017-09-13

## 2017-07-09 MED ORDER — HYDROXYZINE HCL 25 MG TABLET
ORAL_TABLET | Freq: Four times a day (QID) | ORAL | 0 refills | 0.00000 days | Status: CP | PRN
Start: 2017-07-09 — End: 2017-08-08

## 2017-07-09 MED ORDER — PEDIATRIC MULTIVITAMIN NO.61-VIT D3 1,500 UNIT-VIT K 800 MCG CAPSULE
ORAL_CAPSULE | Freq: Every day | ORAL | 1 refills | 0 days | Status: SS
Start: 2017-07-09 — End: 2017-08-31

## 2017-07-09 MED ORDER — CHLORDIAZEPOXIDE 25 MG CAPSULE
ORAL_CAPSULE | 0 refills | 0 days | Status: CP
Start: 2017-07-09 — End: 2017-07-12

## 2017-07-10 MED ORDER — LIDOCAINE 5 % TOPICAL PATCH
MEDICATED_PATCH | Freq: Every day | TRANSDERMAL | 1 refills | 0.00000 days | Status: CP
Start: 2017-07-10 — End: 2017-08-09

## 2017-07-13 MED ORDER — CHLORDIAZEPOXIDE 25 MG CAPSULE
ORAL_CAPSULE | Freq: Every day | ORAL | 0 refills | 0.00000 days
Start: 2017-07-13 — End: 2017-07-15

## 2017-07-13 MED ORDER — ESCITALOPRAM 20 MG TABLET
ORAL_TABLET | 0 refills | 0 days | Status: CP
Start: 2017-07-13 — End: 2017-09-14

## 2017-07-31 ENCOUNTER — Encounter: Admit: 2017-07-31 | Discharge: 2017-07-31 | Discharge status: Against Medical Advice | Payer: PRIVATE HEALTH INSURANCE

## 2017-07-31 DIAGNOSIS — F10920 Alcohol use, unspecified with intoxication, uncomplicated: Principal | ICD-10-CM

## 2017-08-30 ENCOUNTER — Ambulatory Visit
Admit: 2017-08-30 | Discharge: 2017-09-14 | Disposition: A | Discharge status: Home / Self Care | Payer: PRIVATE HEALTH INSURANCE | Admitting: Pulmonary Disease

## 2017-08-30 DIAGNOSIS — J471 Bronchiectasis with (acute) exacerbation: Principal | ICD-10-CM

## 2017-08-31 DIAGNOSIS — J471 Bronchiectasis with (acute) exacerbation: Principal | ICD-10-CM

## 2017-09-13 DIAGNOSIS — J471 Bronchiectasis with (acute) exacerbation: Principal | ICD-10-CM

## 2017-09-13 MED ORDER — OLANZAPINE 15 MG TABLET
ORAL_TABLET | Freq: Every evening | ORAL | 2 refills | 0 days | Status: CP
Start: 2017-09-13 — End: 2017-09-29

## 2017-09-13 MED ORDER — MAGNESIUM OXIDE 400 MG (241.3 MG MAGNESIUM) TABLET
ORAL_TABLET | Freq: Every day | ORAL | 2 refills | 0 days | Status: SS
Start: 2017-09-13 — End: 2017-11-19

## 2017-09-13 MED ORDER — GABAPENTIN 300 MG CAPSULE
ORAL_CAPSULE | Freq: Three times a day (TID) | ORAL | 0 refills | 0 days | Status: CP
Start: 2017-09-13 — End: 2017-09-29

## 2017-09-29 ENCOUNTER — Ambulatory Visit
Admit: 2017-09-29 | Discharge: 2017-09-30 | Payer: PRIVATE HEALTH INSURANCE | Attending: Psychosomatic Medicine | Primary: Psychosomatic Medicine

## 2017-09-29 DIAGNOSIS — F1021 Alcohol dependence, in remission: Secondary | ICD-10-CM

## 2017-09-29 DIAGNOSIS — Z79899 Other long term (current) drug therapy: Secondary | ICD-10-CM

## 2017-09-29 DIAGNOSIS — F419 Anxiety disorder, unspecified: Secondary | ICD-10-CM

## 2017-09-29 DIAGNOSIS — Z0189 Encounter for other specified special examinations: Secondary | ICD-10-CM

## 2017-09-29 DIAGNOSIS — F329 Major depressive disorder, single episode, unspecified: Secondary | ICD-10-CM

## 2017-09-29 MED ORDER — MIRTAZAPINE 7.5 MG TABLET
ORAL_TABLET | Freq: Every evening | ORAL | 0 refills | 0.00000 days | Status: CP
Start: 2017-09-29 — End: 2017-10-21

## 2017-09-29 MED ORDER — OLANZAPINE 15 MG TABLET
ORAL_TABLET | Freq: Every evening | ORAL | 0 refills | 0 days | Status: CP
Start: 2017-09-29 — End: 2017-12-10

## 2017-09-29 MED ORDER — GABAPENTIN 300 MG CAPSULE
ORAL_CAPSULE | Freq: Three times a day (TID) | ORAL | 0 refills | 0 days | Status: CP
Start: 2017-09-29 — End: 2017-10-21

## 2017-10-21 MED ORDER — GABAPENTIN 300 MG CAPSULE
ORAL_CAPSULE | Freq: Three times a day (TID) | ORAL | 0 refills | 0 days | Status: CP
Start: 2017-10-21 — End: 2017-11-15

## 2017-10-21 MED ORDER — MIRTAZAPINE 7.5 MG TABLET
ORAL_TABLET | Freq: Every evening | ORAL | 0 refills | 0.00000 days | Status: CP
Start: 2017-10-21 — End: 2017-11-15

## 2017-11-15 MED ORDER — GABAPENTIN 300 MG CAPSULE
ORAL_CAPSULE | Freq: Three times a day (TID) | ORAL | 0 refills | 0.00000 days | Status: CP
Start: 2017-11-15 — End: 2017-12-10

## 2017-11-15 MED ORDER — MIRTAZAPINE 7.5 MG TABLET
ORAL_TABLET | Freq: Every evening | ORAL | 0 refills | 0.00000 days | Status: SS
Start: 2017-11-15 — End: 2017-12-04

## 2017-11-17 ENCOUNTER — Ambulatory Visit
Admit: 2017-11-17 | Discharge: 2017-12-04 | Disposition: A | Discharge status: Home / Self Care | Payer: PRIVATE HEALTH INSURANCE | Admitting: Specialist

## 2017-11-17 DIAGNOSIS — M545 Low back pain: Principal | ICD-10-CM

## 2017-11-20 DIAGNOSIS — M545 Low back pain: Principal | ICD-10-CM

## 2017-11-22 DIAGNOSIS — M545 Low back pain: Principal | ICD-10-CM

## 2017-11-29 DIAGNOSIS — M545 Low back pain: Principal | ICD-10-CM

## 2017-12-03 MED ORDER — SODIUM CHLORIDE 7 % FOR NEBULIZATION: mL | 2 refills | 0 days

## 2017-12-03 MED ORDER — SODIUM CHLORIDE 7 % FOR NEBULIZATION
Freq: Four times a day (QID) | RESPIRATORY_TRACT | 2 refills | 0.00000 days | Status: SS
Start: 2017-12-03 — End: 2017-12-03

## 2017-12-04 MED ORDER — POLYETHYLENE GLYCOL 3350 17 GRAM ORAL POWDER PACKET
Freq: Every day | ORAL | 0 refills | 0.00000 days | Status: CP | PRN
Start: 2017-12-04 — End: 2018-04-06

## 2017-12-04 MED ORDER — MIRTAZAPINE 7.5 MG TABLET
ORAL_TABLET | Freq: Every evening | ORAL | 0 refills | 0 days | Status: CP
Start: 2017-12-04 — End: 2017-12-09

## 2017-12-04 MED ORDER — LIDOCAINE 5 % TOPICAL PATCH
MEDICATED_PATCH | Freq: Every day | TRANSDERMAL | 0 refills | 0.00000 days | Status: SS | PRN
Start: 2017-12-04 — End: 2018-01-22

## 2017-12-04 MED ORDER — NICOTINE 21 MG/24 HR DAILY TRANSDERMAL PATCH
MEDICATED_PATCH | Freq: Every day | TRANSDERMAL | 0 refills | 0.00000 days | Status: CP
Start: 2017-12-04 — End: 2018-04-06

## 2017-12-04 MED ORDER — NICOTINE (POLACRILEX) 4 MG GUM
BUCCAL | 0 refills | 0.00000 days | Status: SS | PRN
Start: 2017-12-04 — End: 2018-01-22

## 2017-12-04 MED ORDER — TRAZODONE 50 MG TABLET
ORAL_TABLET | Freq: Every evening | ORAL | 0 refills | 0.00000 days | Status: SS
Start: 2017-12-04 — End: 2018-01-22

## 2017-12-04 MED ORDER — INSULIN GLARGINE (U-100) 100 UNIT/ML (3 ML) SUBCUTANEOUS PEN
Freq: Every evening | SUBCUTANEOUS | 0 refills | 0.00000 days | Status: CP
Start: 2017-12-04 — End: 2018-02-04

## 2017-12-10 MED ORDER — OLANZAPINE 15 MG TABLET
ORAL_TABLET | Freq: Every evening | ORAL | 0 refills | 0 days | Status: CP
Start: 2017-12-10 — End: 2018-01-04

## 2017-12-10 MED ORDER — GABAPENTIN 300 MG CAPSULE
ORAL_CAPSULE | Freq: Three times a day (TID) | ORAL | 0 refills | 0 days | Status: CP
Start: 2017-12-10 — End: 2018-01-04

## 2017-12-10 MED ORDER — MIRTAZAPINE 7.5 MG TABLET
ORAL_TABLET | Freq: Every evening | ORAL | 0 refills | 0 days | Status: SS
Start: 2017-12-10 — End: 2018-01-22

## 2018-01-05 ENCOUNTER — Ambulatory Visit
Admit: 2018-01-05 | Discharge: 2018-01-06 | Payer: PRIVATE HEALTH INSURANCE | Attending: Psychosomatic Medicine | Primary: Psychosomatic Medicine

## 2018-01-05 DIAGNOSIS — F329 Major depressive disorder, single episode, unspecified: Secondary | ICD-10-CM

## 2018-01-05 DIAGNOSIS — F1021 Alcohol dependence, in remission: Secondary | ICD-10-CM

## 2018-01-05 DIAGNOSIS — F411 Generalized anxiety disorder: Principal | ICD-10-CM

## 2018-01-05 DIAGNOSIS — F1010 Alcohol abuse, uncomplicated: Secondary | ICD-10-CM

## 2018-01-05 MED ORDER — OLANZAPINE 15 MG TABLET: 15 mg | tablet | Freq: Every evening | 2 refills | 0 days | Status: SS

## 2018-01-05 MED ORDER — TRAZODONE 50 MG TABLET
ORAL_TABLET | Freq: Every evening | ORAL | 2 refills | 0.00000 days | Status: CP
Start: 2018-01-05 — End: 2018-02-28

## 2018-01-05 MED ORDER — GABAPENTIN 300 MG CAPSULE
ORAL_CAPSULE | Freq: Three times a day (TID) | ORAL | 2 refills | 0.00000 days | Status: CP
Start: 2018-01-05 — End: 2018-02-04

## 2018-01-05 MED ORDER — MIRTAZAPINE 30 MG TABLET
ORAL_TABLET | Freq: Every evening | ORAL | 2 refills | 0 days | Status: SS
Start: 2018-01-05 — End: 2018-01-22

## 2018-01-05 MED ORDER — OLANZAPINE 15 MG TABLET
ORAL_TABLET | Freq: Every evening | ORAL | 2 refills | 0.00000 days | Status: SS
Start: 2018-01-05 — End: 2018-01-22

## 2018-01-05 MED ORDER — GABAPENTIN 300 MG CAPSULE: 900 mg | capsule | Freq: Three times a day (TID) | 0 refills | 0 days | Status: AC

## 2018-01-12 ENCOUNTER — Ambulatory Visit
Admit: 2018-01-12 | Discharge: 2018-02-04 | Disposition: A | Discharge status: Home / Self Care | Payer: PRIVATE HEALTH INSURANCE | Admitting: Internal Medicine

## 2018-01-25 MED ORDER — SODIUM POLYSTYRENE SULFONATE 15 GRAM-SORBITOL 20 GRAM/60 ML ORAL SUSP
Freq: Four times a day (QID) | ORAL | 0 refills | 0.00000 days
Start: 2018-01-25 — End: 2018-01-25

## 2018-02-04 MED ORDER — INSULIN ASPART (U-100) 100 UNIT/ML (3 ML) SUBCUTANEOUS PEN
Freq: Three times a day (TID) | SUBCUTANEOUS | 0 refills | 0.00000 days | Status: CP
Start: 2018-02-04 — End: 2018-04-06

## 2018-02-04 MED ORDER — LIDOCAINE 5 % TOPICAL PATCH
MEDICATED_PATCH | TRANSDERMAL | 0 refills | 0 days | Status: CP
Start: 2018-02-04 — End: 2018-04-06

## 2018-02-04 MED ORDER — NICOTINE (POLACRILEX) 4 MG GUM
BUCCAL | 0 refills | 0 days | Status: CP | PRN
Start: 2018-02-04 — End: 2018-04-06

## 2018-02-04 MED ORDER — MELATONIN 3 MG TABLET
ORAL_TABLET | Freq: Every evening | ORAL | 0 refills | 0.00000 days | Status: CP
Start: 2018-02-04 — End: ?

## 2018-02-04 MED ORDER — GABAPENTIN 300 MG CAPSULE
ORAL_CAPSULE | Freq: Three times a day (TID) | ORAL | 0 refills | 0.00000 days | Status: CP
Start: 2018-02-04 — End: 2018-02-28

## 2018-02-04 MED ORDER — NORTRIPTYLINE 10 MG CAPSULE
ORAL_CAPSULE | Freq: Every evening | ORAL | 0 refills | 0.00000 days | Status: CP
Start: 2018-02-04 — End: 2018-04-06

## 2018-02-04 MED ORDER — INSULIN GLARGINE (U-100) 100 UNIT/ML SUBCUTANEOUS SOLUTION
Freq: Every evening | SUBCUTANEOUS | 0 refills | 0 days | Status: CP
Start: 2018-02-04 — End: 2018-04-06

## 2018-02-04 MED ORDER — METHYL SALICYLATE-MENTHOL TOPICAL OINTMENT
Freq: Four times a day (QID) | TOPICAL | 1 refills | 0.00000 days | Status: CP | PRN
Start: 2018-02-04 — End: 2018-04-06

## 2018-02-04 MED ORDER — MIRTAZAPINE 45 MG TABLET
ORAL_TABLET | Freq: Every evening | ORAL | 3 refills | 0 days | Status: CP
Start: 2018-02-04 — End: 2018-02-28

## 2018-02-04 MED ORDER — BUSPIRONE 10 MG TABLET
ORAL_TABLET | Freq: Three times a day (TID) | ORAL | 0 refills | 0 days | Status: CP
Start: 2018-02-04 — End: 2018-02-28

## 2018-02-05 MED ORDER — MAGNESIUM OXIDE 400 MG (241.3 MG MAGNESIUM) TABLET
ORAL_TABLET | Freq: Every day | ORAL | 11 refills | 0 days | Status: CP
Start: 2018-02-05 — End: 2018-05-09

## 2018-02-05 MED ORDER — CHOLECALCIFEROL (VITAMIN D3) 125 MCG (5,000 UNIT) TABLET
ORAL_TABLET | Freq: Every day | ORAL | 0 refills | 0 days | Status: CP
Start: 2018-02-05 — End: 2018-04-06

## 2018-02-05 MED ORDER — PEDIATRIC MULTIVITAMIN NO.61-VIT D3 1,500 UNIT-VIT K 800 MCG CAPSULE
Freq: Every day | ORAL | 1 refills | 0 days | Status: CP
Start: 2018-02-05 — End: 2018-06-02

## 2018-02-05 MED ORDER — PHYTONADIONE (VITAMIN K1) 5 MG TABLET
ORAL_TABLET | ORAL | 11 refills | 0.00000 days | Status: SS
Start: 2018-02-05 — End: 2018-04-12

## 2018-02-28 ENCOUNTER — Encounter
Admit: 2018-02-28 | Discharge: 2018-03-01 | Payer: PRIVATE HEALTH INSURANCE | Attending: Psychiatric/Mental Health | Primary: Psychiatric/Mental Health

## 2018-02-28 DIAGNOSIS — F411 Generalized anxiety disorder: Principal | ICD-10-CM

## 2018-02-28 MED ORDER — BUSPIRONE 10 MG TABLET
ORAL_TABLET | Freq: Three times a day (TID) | ORAL | 0 refills | 0.00000 days | Status: CP
Start: 2018-02-28 — End: 2018-03-28

## 2018-02-28 MED ORDER — GABAPENTIN 300 MG CAPSULE
ORAL_CAPSULE | 0 refills | 0 days | Status: CP
Start: 2018-02-28 — End: 2018-03-28

## 2018-02-28 MED ORDER — MIRTAZAPINE 45 MG TABLET
ORAL_TABLET | Freq: Every evening | ORAL | 0 refills | 0 days | Status: CP
Start: 2018-02-28 — End: 2018-03-28

## 2018-02-28 MED ORDER — OLANZAPINE 15 MG TABLET
ORAL_TABLET | Freq: Every evening | ORAL | 0 refills | 0 days | Status: CP
Start: 2018-02-28 — End: 2018-03-28

## 2018-02-28 MED ORDER — TRAZODONE 50 MG TABLET
ORAL_TABLET | Freq: Every evening | ORAL | 0 refills | 0 days | Status: CP
Start: 2018-02-28 — End: 2018-03-28

## 2018-02-28 NOTE — Unmapped (Signed)
[facility]  Psychiatry   Established Patient E&M Service - Outpatient     Assessment:  Walter [name] is a 37 y.o. male with a history of bipolar disorder, alcohol use disorder,  history of cystic fibrosis, cirrhosis, pancreatic insufficiency, and diabetes who presents for follow-up evaluation. He was seen on 09/29/17 for initial evaluation and at that time mirtazapine was added to his zyprexa regimen for treatment of anxiety. He was admitted for CF exacerbation twice, 11/17/17 and 01/05/18 and seen by psychiatry C/L service during that hospitalization for management of ongoing anxiety. Mirtazapine dose was increased to 15mg  QHS. Since then he reports improvement in mood but he continues to struggle with anxiety and panic attacks. He reports that he has been sober for the last 5 months and attending AA meetings.  Will increase dose of mirtazapine to 30mg  QHS for anxiety and increase gabapentin dose to 1200/900/900 (will follow renal function). Continue all other medications Hospitalization in 8/19. Panic attacks about 1x per week. Endorsing anxiety every day. Will increase mirtazapine to 45mg  today for further effectiveness. No additional medication changes today.      Risk Assessment:  A suicide and violence risk assessment was performed as part of this evaluation. There patient is deemed to be at chronic elevated risk for self-harm/suicide given the following factors: male age 36-35, chronic severe medical condition, chronic mental illness > 5 years and past substance abuse. The patient is deemed to be at chronic elevated risk for violence given the following factors: male gender and younger age. These risk factors are mitigated by the following factors:lack of active SI/HI, no know access to weapons or firearms, expresses purpose for living, safe housing and presence of a safety plan with follow-up care. There is no acute risk for suicide or violence at this time. The patient was educated about relevant modifiable risk factors including following recommendations for treatment of psychiatric illness and abstaining from substance abuse.    While future psychiatric events cannot be accurately predicted, the patient does not currently require acute inpatient psychiatric care and does not currently meet [facility] involuntary commitment criteria.      Stressors: chronic mental illness, stress from employment     Plan:  #Depression, unspecified (h/o bipolar disorder) -   -Continue with zyprexa 15mg  QHS   -Continue with mirtazapine 45mg  qHS   -Recommended individual psychotherapy but patient would like to hold off on this  ??  #Anxiety, unspecified (consider GAD) - increase mirtazapine to 30mg  QHS  -increase gabapentin to 1200mg  QAM, 900mg  qPM and 900mg  QHS (previously 900mg  TID)- BUN/Creatinine from 12/04/17 -  19/1.20. Consider increasing dose further if continued anxiety, while monitoring renal function.  -has been on hydroxyzine in the past and reports it doesn't work for him  -consider initiation of buspirone   -recommended individual psychotherapy but patient would like to hold off on this     #Insomnia -continue with trazodone 50mg  QHS  ??  ??  #Alcohol use disorder -encouraged continued abstinence and attendance in AA meetings     Risks/benefits and indications for treatment with medications above were discussed with the patient. The patient asked appropriate questions, acknowledged understanding of answers, and provided informed consent to increasing doses of mirtazapine and gabapentin and continuing zyprexa and trazodone.     Psychotherapy:  No billable psychotherapy service provided.    Patient has been given this writer's contact information as well as the [facility] Psychiatry urgent line number. The patient has been instructed to call  [phone] for emergencies.        Subjective:     Psychiatric Chief Concern:  Follow-up psychiatric evaluation for depression    Interval History:  He reports that continues to struggle with anxiety and difficulty controlling worry especially at work. He reports panic attacks a few times a week. 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   Sleep is pretty good. Denies trouble falling or staying asleep. Appetite: pretty good.   Denies anhedonia. He denies any problems with energy but reports that has difficulty with concentration.  He denies hopelessness or suicidal ideation. Denies AH/VH or PI. No trouble with ADL's. Encouraged OPT.   He reports that he has been sober since 07/30/17. He is living in apartment with a friend.        Social History: reviewed; pertinents have been documented in the interval history section.    ROS:  As per Interval History and:  Constitutional:  fatigued  Neuro:  none / negative      Objective:    Mental Status Exam:  Appearance:    Appears stated age   Motor:   No abnormal movements   Speech/Language:    Normal rate, volume, tone, fluency   Mood:   Anxious   Affect:   Cooperative and Constricted   Thought process and Associations:   Logical, linear, clear, coherent, goal directed   Abnormal/psychotic thought content:     Denies SI, HI, self harm, delusions, obsessions, paranoid ideation, or ideas of reference   Perceptual disturbances:     Denies auditory and visual hallucinations, behavior not concerning for response to internal stimuli     Orientation:   Oriented to person, place, time, and general circumstances   Attention and Concentration:   Able to fully concentrate and attend   Memory:   Immediate, short-term, long-term, and recall grossly intact    Fund of knowledge:    Consistent with level of education and development   Insight:     Fair   Judgment:    Fair   Impulse Control:   Fair       Medications: reviewed at today's visit    Vitals: Blood pressure 130/95, pulse 97, temperature 36.3 ??C (97.4 ??F), temperature source Oral, height 185.4 cm (6' 0.99), weight 75.4 kg (166 lb 3.2 oz).    PE:   Vital signs were reviewed.      Gait and station assessed with no abnormalities    Psychometrics:  Psych Scale Scores - Adult      Office Visit from 09/29/2017 in [facility] [place]   **PHQ-9: Severity Measure for DEPRESSION Total Score**  12 Collected on 09/29/2017 0000          Robert Bellow PMHNP-BC  02/28/2018

## 2018-03-02 MED ORDER — DORNASE ALFA 1 MG/ML SOLUTION FOR INHALATION
Freq: Every day | RESPIRATORY_TRACT | 3 refills | 0 days | Status: CP
Start: 2018-03-02 — End: 2019-03-02
  Filled 2018-03-15: qty 75, 30d supply, fill #0

## 2018-03-02 MED ORDER — SODIUM CHLORIDE 7 % FOR NEBULIZATION
Freq: Two times a day (BID) | RESPIRATORY_TRACT | 3 refills | 0 days | Status: CP
Start: 2018-03-02 — End: 2019-03-02
  Filled 2018-03-04: qty 720, 90d supply, fill #0

## 2018-03-02 MED ORDER — NEBULIZERS
11 refills | 0 days | Status: CP
Start: 2018-03-02 — End: ?

## 2018-03-02 NOTE — Unmapped (Signed)
Walter [name] called the [facility] nurse coordinators. States he needs hypertonic saline and Pulmozyme sent to his home. Asking if [employer] Kennedy Bucker can be applied to these.     Prescriptions sent in to [facility]. Will notify pharmacist of this request.     [name] was not interested in getting follow-up lab work (from hospitalization) at this time.    [name], RN  CF Nurse Coordinator   [phone]

## 2018-03-02 NOTE — Unmapped (Signed)
Per test claim for PULMOZYME 1 MG/ML NEB SOL at the [facility], patient needs Medication Assistance Program for High Copay. [phone]    PARI-$0.00  SODIUM CHLORIDE $0.00

## 2018-03-04 MED FILL — LC PLUS MISC: 30 days supply | Qty: 2 | Fill #0

## 2018-03-04 MED FILL — LC PLUS MISC: 30 days supply | Qty: 2 | Fill #0 | Status: AC

## 2018-03-04 MED FILL — SODIUM CHLORIDE 7 % FOR NEBULIZATION: 90 days supply | Qty: 720 | Fill #0 | Status: AC

## 2018-03-14 NOTE — Unmapped (Signed)
[facility]   Patient Onboarding/Medication Counseling    Mr.Federici is a 37 y.o. male with Cystic Fibrosis who I am counseling today on initiation of therapy.    Verified patient's date of birth / HIPAA.    Specialty medication(s) to be sent: CF/Pulmonary: -PULMOZYME (dornase alfa) 1mg /mL inhalation solution      Non-specialty medications to be sent: neb cup       Medications not needed at this time: na         Patient declined any counseling.  Advised him that this does need to be kept in the fridge     Comorbidities and Allergies     Reviewed and up to date in Epic.    Appropriateness of Therapy     Verified therapy is appropriate and should continue    Financial Information     Medication Assistance provided: Kennedy Bucker Assistance     Anticipated copay of $0 reviewed with patient. Verified delivery address.    Delivery Information     Scheduled Delivery Date: 03/15/18 courier     Explained that we ship using UPS or courier and this shipment will not require a signature.      Explained the services we provide at [facility] and that each month we would call to set up refills.  Stressed importance of returning phone calls so that we could ensure they receive their medications in time each month.  Informed patient that we should be setting up refills 7-10 days prior to when they will run out of medication.  Informed patient that a welcome packet and a drug information handout will be sent.      Patient verbalized understanding of the above information as well as how to contact the pharmacy at [phone] option 4 with any questions/concerns.  The pharmacy is open Monday through Friday 8:30am-4:30pm.  A pharmacist is available 24/7 via pager to answer any clinical questions they may have.    Patient Specific Needs     ? Patient has no physical, cognitive, or cultural barriers.    ? Patient prefers to have medications discussed with  Patient     ? Patient is able to read and understand education materials at a high school level or above.    ? Patient's primary language is  English       Counselling psychologist  [facility] Specialty Pharmacist

## 2018-03-14 NOTE — Unmapped (Signed)
[facility]  Adult Cystic Fibrosis Clinic     As part of [facility] CF [employer], [place] mailed patient [phone] gift card for food due to financial difficulties and associated food insecurity.

## 2018-03-15 MED FILL — LC PLUS MISC: 30 days supply | Qty: 1 | Fill #1 | Status: AC

## 2018-03-15 MED FILL — PULMOZYME 1 MG/ML SOLUTION FOR INHALATION: 30 days supply | Qty: 75 | Fill #0 | Status: AC

## 2018-03-15 MED FILL — LC PLUS MISC: 30 days supply | Qty: 1 | Fill #1

## 2018-03-15 NOTE — Unmapped (Signed)
Walter [name] called the CF nurse coordinator office. He is request an appointment as soon as possible and as early as possible in the day. Stated Dr. Lurena Nida may have some availability. Aqib does not want to do PFTs.     Will reach out to Dr. Lorriane Shire to see if he can see Walter [name] after QI on Thursday or another day this week.     [name], RN  CF Nurse Coordinator   [phone]

## 2018-03-15 NOTE — Unmapped (Signed)
Discussed with Dr. Lurena Nida. He is agreeable to seeing Walter [name] on Thursday at 0900. Front desk scheduled the appointment. Spoke with [employer]. He was made aware of the day and time. He expressed his appreciation for being worked in.

## 2018-03-28 ENCOUNTER — Encounter
Admit: 2018-03-28 | Discharge: 2018-03-29 | Payer: PRIVATE HEALTH INSURANCE | Attending: Psychiatric/Mental Health | Primary: Psychiatric/Mental Health

## 2018-03-28 DIAGNOSIS — F411 Generalized anxiety disorder: Principal | ICD-10-CM

## 2018-03-28 MED ORDER — GABAPENTIN 300 MG CAPSULE
ORAL_CAPSULE | 1 refills | 0 days | Status: CP
Start: 2018-03-28 — End: 2018-06-02

## 2018-03-28 MED ORDER — MIRTAZAPINE 45 MG TABLET
ORAL_TABLET | Freq: Every evening | ORAL | 1 refills | 0 days | Status: CP
Start: 2018-03-28 — End: 2018-06-02

## 2018-03-28 MED ORDER — OLANZAPINE 15 MG TABLET
ORAL_TABLET | Freq: Every evening | ORAL | 1 refills | 0 days | Status: CP
Start: 2018-03-28 — End: 2018-05-30

## 2018-03-28 MED ORDER — TRAZODONE 50 MG TABLET
ORAL_TABLET | Freq: Every evening | ORAL | 1 refills | 0 days | Status: CP
Start: 2018-03-28 — End: 2018-06-02

## 2018-03-28 MED ORDER — BUSPIRONE 15 MG TABLET
ORAL_TABLET | Freq: Three times a day (TID) | ORAL | 1 refills | 0.00000 days | Status: CP
Start: 2018-03-28 — End: 2018-04-27

## 2018-03-28 NOTE — Unmapped (Signed)
Follow-up instructions:  -- Please continue taking your medications as prescribed for your mental health.   -- Do not make changes to your medications, including taking more or less than prescribed, unless under the supervision of your physician. Be aware that some medications may make you feel worse if abruptly stopped  -- Please refrain from using illicit substances, as these can affect your mood and could cause anxiety or other concerning symptoms.   -- Seek further medical care for any increase in symptoms or new symptoms such as thoughts of wanting to hurt yourself or hurt others.     Contact info:  Life-threatening emergencies: call 911 or go to the nearest ER for medical or psychiatric attention.     Issues that need urgent attention but are not life threatening: call the clinic at [phone].    Non-urgent routine concerns, questions, and refill requests: please call the clinic for assistance.     Regarding appointments:  - If you need to cancel your appointment, we ask that you call [phone] at least 24 hours before your scheduled appointment.  - If for any reason you arrive 15 minutes later than your scheduled appointment time, you may not be seen and your visit may be rescheduled.  - Please remember that we will not automatically reschedule missed appointments.  - If you miss two (2) appointments without letting us know at least 24 hours in advance, you will likely be referred to a provider in your community.  - We will do our best to be on time. Sometimes an emergency will arise that might cause your clinician to be late. We will try to inform you of this when you check in for your appointment. If you wait more than 15 minutes past your appointment time without such notice, please speak with the front desk staff.    In the event of bad weather, the clinic staff will attempt to contact you, should your appointment need to be rescheduled. Additionally, you can call the Patient Weather Line [phone] for system-wide clinic status    For more information and reminders regarding clinic policies (these were provided when you were admitted to the clinic), please ask the front desk.    Deyvi Bonanno, MPH, PMHNP-BC  Department of Psychiatry  [facility]  [address]  [address]  [place], [place] [address]  Phone: [phone]

## 2018-03-28 NOTE — Unmapped (Signed)
[facility]  Psychiatry   Established Patient E&M Service - Outpatient     Assessment:  Walter [name] is a 37 y.o. male with a history of bipolar disorder, alcohol use disorder,  history of cystic fibrosis, cirrhosis, pancreatic insufficiency, and diabetes who presents for follow-up evaluation. This is a patient that usually seen by Dr. Lucile Shutters who this writer is covering for today. Chart reviewed today.  He was seen on 09/29/17 for initial evaluation and at that time mirtazapine was added to his zyprexa regimen for treatment of anxiety. He was admitted for CF exacerbation twice, 11/17/17 and 01/05/18 and seen by psychiatry C/L service during that hospitalization for management of ongoing anxiety. At 01/05/18 follow up appointment, Mirtazapine dose was increased to 45mg  QHS and gabapentin dose  to 1200/900/900 (will follow renal function). States he noticed his appetite increased with mirtazapine dose but is now low and he is only eating once per day.  He reports that he has been sober for the last 5 months and attending AA meetings.  Today patient is endorsing panic attacks about 1x per week and high anxiety every day, high irritability, hopelessness. States he feels like his medications are just an experiment and are not helping. Encouraged patient to engage in OPT, does not feel ready for referral today. Denies SI, intent or plan. Will increase buspirone today for anxiety. No other medication changes made today.     Risk Assessment:  A suicide and violence risk assessment was performed as part of this evaluation. There patient is deemed to be at chronic elevated risk for self-harm/suicide given the following factors: male age 74-35, chronic severe medical condition, chronic mental illness > 5 years and past substance abuse. The patient is deemed to be at chronic elevated risk for violence given the following factors: male gender and younger age. These risk factors are mitigated by the following factors:lack of active SI/HI, no know access to weapons or firearms, expresses purpose for living, safe housing and presence of a safety plan with follow-up care. There is no acute risk for suicide or violence at this time. The patient was educated about relevant modifiable risk factors including following recommendations for treatment of psychiatric illness and abstaining from substance abuse.    While future psychiatric events cannot be accurately predicted, the patient does not currently require acute inpatient psychiatric care and does not currently meet [facility] involuntary commitment criteria.      Stressors: chronic mental illness, stress from employment     Plan:  #Depression, unspecified (h/o bipolar disorder) -   -Continue with zyprexa 15mg  QHS   -Continue with mirtazapine 45mg  qHS   -increase buspar 15mg  TID  -Recommended individual psychotherapy but patient would like to hold off on this  ??  #Anxiety, unspecified (consider GAD) - increase mirtazapine to 45 mg QHS  -increase gabapentin to 1200mg  QAM, 900mg  qPM and 900mg  QHS (previously 900mg  TID)- BUN/Creatinine from 12/04/17 -  19/1.20. Consider increasing dose further if continued anxiety, while monitoring renal function.  -has been on hydroxyzine in the past and reports it doesn't work for him  -recommended individual psychotherapy but patient would like to hold off on this     #Insomnia -continue with trazodone 50mg  QHS  ??  ??  #Alcohol use disorder -encouraged continued abstinence and attendance in AA meetings     Risks/benefits and indications for treatment with medications above were discussed with the patient. The patient asked appropriate questions, acknowledged understanding of answers, and provided informed  consent to increasing doses of mirtazapine and gabapentin and continuing zyprexa and trazodone.     Psychotherapy:  No billable psychotherapy service provided.    Patient has been given this writer's contact information as well as the [facility] Psychiatry urgent line number. The patient has been instructed to call [phone] for emergencies.        Subjective:     Psychiatric Chief Concern:  Follow-up psychiatric evaluation for depression    Interval History:  Since increasing mirtazapine, patient noticed appetite increased for awhile and now is back to eating one meal a day and low appetite. Had one anxiety attack last week at work. Happening about every other week. Sleep has been good lately. Has had increased thirst and is getting up every 2 hours to use the restroom. Encouraged patient to speak to PCP/Endocronologist. States glucose has been running in the mid 200-300 range. Reports he is taking insulin as prescribed. Patient was highly irritable today. States he is only at this appointment because it gets him out of work.     Reports compliance with medications. Denies SI/HI/AH/VH/paranoia. Denies substance abuse. No problems with ADLs. No new financial stressors. Reports positive relationships with others.[name]  He reports that he has been sober since 07/30/17. He is living in apartment with a friend.        Social History: reviewed; pertinents have been documented in the interval history section.    ROS:  As per Interval History and:  Constitutional:  fatigued  Neuro:  none / negative      Objective:    Mental Status Exam:  Appearance:    Appears stated age   Motor:   No abnormal movements   Speech/Language:    Normal rate, volume, tone, fluency   Mood:   Anxious   Affect:   Anxious, Dysthymic and Irritable   Thought process and Associations:   Logical, linear, clear, coherent, goal directed   Abnormal/psychotic thought content:     Denies SI, HI, self harm, delusions, obsessions, paranoid ideation, or ideas of reference   Perceptual disturbances:     Denies auditory and visual hallucinations, behavior not concerning for response to internal stimuli     Orientation:   Oriented to person, place, time, and general circumstances   Attention and Concentration:   Able to fully concentrate and attend   Memory:   Immediate, short-term, long-term, and recall grossly intact    Fund of knowledge:    Consistent with level of education and development   Insight:     Fair   Judgment:    Fair   Impulse Control:   Fair       Medications: reviewed at today's visit    Vitals: There were no vitals taken for this visit.    PE:   Vital signs were reviewed.      Gait and station assessed with no abnormalities    Psychometrics:  Psych Scale Scores - Adult      Office Visit from 03/28/2018 in [facility] [place]   **PHQ-9: Severity Measure for DEPRESSION Total Score**  11 Collected on 03/28/2018 0000          Robert Bellow PMHNP-BC  03/28/2018

## 2018-04-05 DIAGNOSIS — R0602 Shortness of breath: Principal | ICD-10-CM

## 2018-04-06 ENCOUNTER — Ambulatory Visit
Admit: 2018-04-06 | Discharge: 2018-04-20 | Disposition: A | Discharge status: Home / Self Care | Payer: PRIVATE HEALTH INSURANCE | Admitting: Pulmonary Disease

## 2018-04-06 LAB — COMPREHENSIVE METABOLIC PANEL
ALBUMIN: 3.4 g/dL — ABNORMAL LOW (ref 3.5–5.0)
ALKALINE PHOSPHATASE: 402 U/L — ABNORMAL HIGH (ref 38–126)
ALT (SGPT): 24 U/L (ref 19–72)
ANION GAP: 16 mmol/L — ABNORMAL HIGH (ref 7–15)
AST (SGOT): 36 U/L (ref 19–55)
BILIRUBIN TOTAL: 0.5 mg/dL (ref 0.0–1.2)
BLOOD UREA NITROGEN: 15 mg/dL (ref 7–21)
BUN / CREAT RATIO: 9
CALCIUM: 7.6 mg/dL — ABNORMAL LOW (ref 8.5–10.2)
CHLORIDE: 81 mmol/L — ABNORMAL LOW (ref 98–107)
CREATININE: 1.75 mg/dL — ABNORMAL HIGH (ref 0.70–1.30)
EGFR CKD-EPI NON-AA MALE: 49 mL/min/{1.73_m2} — ABNORMAL LOW (ref >=60)
GLUCOSE RANDOM: 1190 mg/dL (ref 65–179)
POTASSIUM: 5.6 mmol/L — ABNORMAL HIGH (ref 3.5–5.0)
PROTEIN TOTAL: 6.1 g/dL — ABNORMAL LOW (ref 6.5–8.3)
SODIUM: 123 mmol/L — ABNORMAL LOW (ref 135–145)

## 2018-04-06 LAB — URINALYSIS
BACTERIA: NONE SEEN /HPF
BILIRUBIN UA: NEGATIVE
BLOOD UA: NEGATIVE
GLUCOSE UA: >1000 — AB
KETONES UA: NEGATIVE
LEUKOCYTE ESTERASE UA: NEGATIVE
NITRITE UA: NEGATIVE
PH UA: 5.5 (ref 5.0–9.0)
PROTEIN UA: NEGATIVE
RBC UA: <1 /HPF (ref <=3)
SQUAMOUS EPITHELIAL: <1 /HPF (ref 0–5)
UROBILINOGEN UA: 0.2
WBC UA: <1 /HPF (ref <=2)

## 2018-04-06 LAB — BASIC METABOLIC PANEL
ANION GAP: 14 mmol/L (ref 7–15)
ANION GAP: 7 mmol/L (ref 7–15)
ANION GAP: 12 mmol/L (ref 7–15)
ANION GAP: 11 mmol/L (ref 7–15)
BLOOD UREA NITROGEN: 18 mg/dL (ref 7–21)
BLOOD UREA NITROGEN: 16 mg/dL (ref 7–21)
BLOOD UREA NITROGEN: 15 mg/dL (ref 7–21)
BUN / CREAT RATIO: 11
BUN / CREAT RATIO: 10
BUN / CREAT RATIO: 10
BUN / CREAT RATIO: 8
CALCIUM: 7.7 mg/dL — ABNORMAL LOW (ref 8.5–10.2)
CALCIUM: 7.8 mg/dL — ABNORMAL LOW (ref 8.5–10.2)
CALCIUM: 8.1 mg/dL — ABNORMAL LOW (ref 8.5–10.2)
CALCIUM: 7.9 mg/dL — ABNORMAL LOW (ref 8.5–10.2)
CHLORIDE: 85 mmol/L — ABNORMAL LOW (ref 98–107)
CHLORIDE: 95 mmol/L — ABNORMAL LOW (ref 98–107)
CHLORIDE: 91 mmol/L — ABNORMAL LOW (ref 98–107)
CO2: 29 mmol/L (ref 22.0–30.0)
CO2: 30 mmol/L (ref 22.0–30.0)
CO2: 29 mmol/L (ref 22.0–30.0)
CO2: 27 mmol/L (ref 22.0–30.0)
CREATININE: 1.56 mg/dL — ABNORMAL HIGH (ref 0.70–1.30)
CREATININE: 1.52 mg/dL — ABNORMAL HIGH (ref 0.70–1.30)
CREATININE: 1.53 mg/dL — ABNORMAL HIGH (ref 0.70–1.30)
EGFR CKD-EPI AA MALE: 59 mL/min/{1.73_m2} — ABNORMAL LOW (ref >=60)
EGFR CKD-EPI AA MALE: 66 mL/min/{1.73_m2} (ref >=60)
EGFR CKD-EPI AA MALE: 67 mL/min/{1.73_m2} (ref >=60)
EGFR CKD-EPI NON-AA MALE: 51 mL/min/{1.73_m2} — ABNORMAL LOW (ref >=60)
EGFR CKD-EPI NON-AA MALE: 59 mL/min/{1.73_m2} — ABNORMAL LOW (ref >=60)
EGFR CKD-EPI NON-AA MALE: 58 mL/min/{1.73_m2} — ABNORMAL LOW (ref >=60)
GLUCOSE RANDOM: 966 mg/dL (ref 65–179)
GLUCOSE RANDOM: 456 mg/dL — ABNORMAL HIGH (ref 65–179)
GLUCOSE RANDOM: 361 mg/dL — ABNORMAL HIGH (ref 65–99)
POTASSIUM: 4.3 mmol/L (ref 3.5–5.0)
POTASSIUM: 4.1 mmol/L (ref 3.5–5.0)
POTASSIUM: 4 mmol/L (ref 3.5–5.0)
SODIUM: 128 mmol/L — ABNORMAL LOW (ref 135–145)
SODIUM: 132 mmol/L — ABNORMAL LOW (ref 135–145)
SODIUM: 132 mmol/L — ABNORMAL LOW (ref 135–145)
SODIUM: 133 mmol/L — ABNORMAL LOW (ref 135–145)

## 2018-04-06 LAB — CBC W/ AUTO DIFF
BASOPHILS ABSOLUTE COUNT: 0 10*9/L (ref 0.0–0.1)
BASOPHILS RELATIVE PERCENT: 0.3 %
EOSINOPHILS ABSOLUTE COUNT: 0.1 10*9/L (ref 0.0–0.4)
EOSINOPHILS RELATIVE PERCENT: 1.4 %
HEMATOCRIT: 36.5 % — ABNORMAL LOW (ref 41.0–53.0)
LARGE UNSTAINED CELLS: 2 % (ref 0–4)
LYMPHOCYTES ABSOLUTE COUNT: 0.8 10*9/L — ABNORMAL LOW (ref 1.5–5.0)
LYMPHOCYTES RELATIVE PERCENT: 11.1 %
MEAN CORPUSCULAR HEMOGLOBIN CONC: 30.2 g/dL — ABNORMAL LOW (ref 31.0–37.0)
MEAN CORPUSCULAR HEMOGLOBIN: 29 pg (ref 26.0–34.0)
MEAN CORPUSCULAR VOLUME: 95.8 fL (ref 80.0–100.0)
MEAN PLATELET VOLUME: 8.5 fL (ref 7.0–10.0)
MONOCYTES ABSOLUTE COUNT: 0.3 10*9/L (ref 0.2–0.8)
MONOCYTES RELATIVE PERCENT: 3.9 %
NEUTROPHILS ABSOLUTE COUNT: 6.1 10*9/L (ref 2.0–7.5)
NEUTROPHILS RELATIVE PERCENT: 81.5 %
PLATELET COUNT: 180 10*9/L (ref 150–440)
RED BLOOD CELL COUNT: 3.81 10*12/L — ABNORMAL LOW (ref 4.50–5.90)
WBC ADJUSTED: 7.5 10*9/L (ref 4.5–11.0)

## 2018-04-06 LAB — C-REACTIVE PROTEIN
C reactive protein:MCnc:Pt:Ser/Plas:Qn:: 51.1 — ABNORMAL HIGH
C reactive protein:MCnc:Pt:Ser/Plas:Qn:: 53 — ABNORMAL HIGH

## 2018-04-06 LAB — TROPONIN I: Troponin I.cardiac:MCnc:Pt:Ser/Plas:Qn:: <0.034

## 2018-04-06 LAB — SMEAR REVIEW

## 2018-04-06 LAB — BLOOD GAS, VENOUS
BASE EXCESS VENOUS: 5 — ABNORMAL HIGH (ref -2.0–2.0)
HCO3 VENOUS: 30 mmol/L — ABNORMAL HIGH (ref 22–27)
O2 SATURATION VENOUS: 93.6 % — ABNORMAL HIGH (ref 40.0–85.0)
PO2 VENOUS: 63 mmHg — ABNORMAL HIGH (ref 30–55)

## 2018-04-06 LAB — BLOOD UREA NITROGEN
Urea nitrogen:MCnc:Pt:Ser/Plas:Qn:: 15
Urea nitrogen:MCnc:Pt:Ser/Plas:Qn:: 18

## 2018-04-06 LAB — PROTIME-INR: PROTIME: 12 s (ref 10.2–13.1)

## 2018-04-06 LAB — MAGNESIUM: Magnesium:MCnc:Pt:Ser/Plas:Qn:: 1.4 — ABNORMAL LOW

## 2018-04-06 LAB — AMMONIA: Ammonia:SCnc:Pt:Plas:Qn:: 10

## 2018-04-06 LAB — BETA-HYDROXYBUTYRATE: Lab: 0.74 — ABNORMAL HIGH

## 2018-04-06 LAB — CREATININE: Creatinine:MCnc:Pt:Ser/Plas:Qn:: 1.56 — ABNORMAL HIGH

## 2018-04-06 LAB — KETONES UA: Lab: NEGATIVE

## 2018-04-06 LAB — SLIDE REVIEW

## 2018-04-06 LAB — AST (SGOT): Aspartate aminotransferase:CCnc:Pt:Ser/Plas:Qn:: 36

## 2018-04-06 LAB — OSMOLALITY MEASURED: Osmolality:Osmol:Pt:Ser/Plas:Qn:: 313 — ABNORMAL HIGH

## 2018-04-06 LAB — INR: Lab: 1.04

## 2018-04-06 LAB — CALCIUM: Calcium:MCnc:Pt:Ser/Plas:Qn:: 7.9 — ABNORMAL LOW

## 2018-04-06 LAB — PHOSPHORUS: Phosphate:MCnc:Pt:Ser/Plas:Qn:: 3.8

## 2018-04-06 LAB — LIPASE: Triacylglycerol lipase:CCnc:Pt:Ser/Plas:Qn:: 14 — ABNORMAL LOW

## 2018-04-06 LAB — O2 SATURATION VENOUS: Oxygen saturation:MFr:Pt:BldV:Qn:: 93.6 — ABNORMAL HIGH

## 2018-04-06 LAB — EOSINOPHILS ABSOLUTE COUNT: Lab: 0.1

## 2018-04-06 NOTE — Unmapped (Signed)
[facility]  Emergency Department Provider Note      ED Clinical Impression     Final diagnoses:   Shortness of breath (Primary)       Initial Impression, ED Course, Assessment and Plan     Impression: Walter [name] is a 37 y.o. male with history of cystic fibrosis, complicated by cirrhosis and diabetes, history of hypertension, history of bipolar, alcohol abuse, hyperglycemia, anxiety, presented to ED for evaluation of coughing, shortness of breath for 1 week, chest pain for 1 day.  Patient reports that for the past week, he had progressively worsening shortness of breath, and coffee, which is typical to his previous cystic fibrosis exacerbation.  However, today, he had worse chest pain which is atypical to him, bilateral lower ribs, worse when he takes deep breath, worse when he coughs.  Patient denies vomiting or diarrhea, endorses chills recently.  Patient denies any recent trauma.  Patient denies any recent travel, no history of steroid use, no recent surgery or trauma, no history of DVT or PE.    On exam, patient has crackles over the left midlung.  Patient has tenderness to palpation over the bilateral lower ribs, reproducible on palpation.    On presentation, patient is hypoxic to 86% on room air, was started on 4 L nasal cannula which increased O2 sats to 94%.  Patient is not on home oxygen.  I think patient has CF exacerbation, however, his chest pain appears to be atypical to him.  I reviewed patient chart which shows that patient had a history of AK I with creatinine 2, this excluded him as a candidate for CT PE protocol.  Patient also has chronic illnesses including cystic fibrosis and cirrhosis, which makes him a poor candidate for d-dimer to rule out PE.  He has baseline chest x-ray that is abnormal significantly enough that he will be a good candidate for VQ scan either.  His chest x-ray looks like cystic fibrosis per radiology.  Patient does have a creatinine today 1.75.  Although unable to rule out, my suspicion is low given the fact that the patient has no risk factor for PE and that he has reproducible chest pain.  I will talk to admitting team before triggering PE evaluation for him.  Patient also has hyperglycemia with glucose 1190, no evidence of DKA.  Will treat patient with insulin and recheck glucose.Spoken with the Medical Admissions Officer. They will contact and disposition the patient to an appropriate medical service for admission in order to continue further treatment and evaluation.    Per chart review, patient was previously on meropenem, ceftaroline, and ciprofloxacin with a positive sputum culture for MRSA, Pseudomonas, andachromobacter.  I therefore ordered a ceftaroline, meropenem, and ciprofloxacin for patient for 1 dose in the ED.      ED Triage Vitals   Vital Signs Group      Temp 04/05/18 2303 37 ??C (98.6 ??F)      Temp Source 04/05/18 2303 Skin      Heart Rate 04/05/18 2303 117      SpO2 Pulse 04/06/18 0102 96      Heart Rate Source 04/05/18 2303 Monitor      Resp 04/05/18 2303 24      BP 04/05/18 2303 124/83      BP Location 04/05/18 2303 Right arm      BP Method 04/05/18 2303 Automatic      Patient Position 04/05/18 2303 Sitting   SpO2 04/05/18 2303 [name])  86 %       ED Course as of Apr 07 1737   Wed Apr 06, 2018   1610 I spoke to medicine team, who recommended insulin drip at the ED given high glucose level, concern for HHS.  I put in the order for insulin drip starting from 0.1 units/KG/hour as recommended from medicine team.        On recheck of his glucose without initiating insulin drip, already down to 500, I therefore hold off the insulin drip and instructed the ED RN to recheck glucose every hour.  Patient currently reporting at ED.  I spoke to admitting team, who accepted the admission.    Additional Medical Decision Making     I have reviewed the vital signs and the nursing notes. Labs and radiology results that were available during my care of the patient were independently reviewed by me and considered in my medical decision making.     Case discussed with ED attending Dr. Hyacinth Meeker who is agreeable with the plan.    ____________________________________________       History     Chief Complaint  Cough      HPI   Walter [name] is a 37 y.o. male with history of cystic fibrosis, complicated by cirrhosis and diabetes, history of hypertension, history of bipolar, alcohol abuse, hyperglycemia, anxiety, presented to ED for evaluation of coughing, shortness of breath for 1 week, chest pain for 1 day.  Patient reports that for the past week, he had progressively worsening shortness of breath, and coffee, which is typical to his previous cystic fibrosis exacerbation.  However, today, he had worse chest pain which is atypical to him, bilateral lower ribs, worse when he takes deep breath, worse when he coughs.  Patient denies vomiting or diarrhea, endorses chills recently.  Patient denies any recent trauma.  Patient denies any recent travel, no history of steroid use, no recent surgery or trauma, no history of DVT or PE.     Review of Systems:  Constitutional: no fever.  Eyes: no visual changes.  ENT: no sore throat.  Cardiovascular:+ chest pain.  Respiratory: + shortness of breath.  Gastrointestinal: no abdominal pain, no vomiting, no diarrhea.  Genitourinary: no dysuria.  Musculoskeletal: no back pain.  Skin: no rash.  Neurological: no headaches, no focal weakness or numbness.    Past Medical History:   Diagnosis Date   ??? Alcohol abuse    ??? 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 Bipolar disorder (CMS-HCC)    ??? Chronic pancreatitis (CMS-HCC)    ??? Chronic sinusitis    ??? 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 Cystic fibrosis (CMS-HCC)    ??? Diabetes mellitus (CMS-HCC)     dx in-house 2015   ??? Disease of thyroid gland    ??? Hypertension    ??? Kidney stones    ??? Portal hypertension (CMS-HCC)        Patient Active Problem List Diagnosis   ??? Cirrhosis (CMS-HCC)   ??? Essential hypertension (RAF-HCC)   ??? Internal hemorrhoids   ??? Chronic abdominal pain   ??? Chronic pancreatitis (CMS-HCC)   ??? Cystic fibrosis exacerbation (CMS-HCC)   ??? Dysthymic disorder   ??? 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 Intentional drug overdose (CMS-HCC)   ??? ETOH abuse   ??? Bipolar disorder, most recent episode manic (CMS-HCC)   ??? Major depressive disorder   ??? Vitamin D deficiency   ???  Thyroid goiter (RAF-HCC)   ??? Tobacco use   ??? DIOS (distal intestinal obstruction syndrome) (CMS-HCC)   ??? Elevated blood pressure reading   ??? Abdominal pain   ??? Anxiety   ??? Cystic fibrosis with pulmonary exacerbation (CMS-HCC)   ??? Suicidal ideation   ??? Alcoholic intoxication without complication (CMS-HCC)   ??? Opioid use disorder, severe, dependence (CMS-HCC)   ??? Cystic fibrosis with liver disease (CMS-HCC)   ??? Bronchiectasis with acute exacerbation (CMS-HCC)   ??? Bipolar affective disorder in remission (CMS-HCC)   ??? Adjustment disorder with mixed disturbance of emotions and conduct   ??? Malnutrition (CMS-HCC)   ??? 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 Depression   ??? Alcohol use disorder, severe, in early remission, in controlled environment (CMS-HCC)   ??? Anxiety disorder due to medical condition   ??? Insomnia   ??? Substance induced mood disorder (CMS-HCC)       Past Surgical History:   Procedure Laterality Date   ??? CHOLECYSTECTOMY  2008   ??? sinus surgery     ??? TONSILLECTOMY           Current Facility-Administered Medications:   ???  ceftaroline (TEFLARO) 600 mg in sodium chloride (NS) 0.9 % 50 mL IVPB, 600 mg, Intravenous, Once, Raynald Blend, MD  ???  ciprofloxacin (CIPRO) 2 mg/mL in dextrose 5% IVPB 400 mg, 400 mg, Intravenous, Once, Raynald Blend, MD, Last Rate: 200 mL/hr at 04/06/18 0215, 400 mg at 04/06/18 0215  ???  HYDROmorphone (PF) (DILAUDID) injection 1 mg, 1 mg, Intravenous, Once, Jamas Lav, MD  ???  lidocaine (LIDODERM) 5 % patch 1 patch, 1 patch, Transdermal, Once, Raynald Blend, MD, 1 patch at 04/06/18 0114  ???  meropenem (MERREM) 2 g in sodium chloride (NS) 0.9 % 100 mL IVPB, 2 g, Intravenous, Once, Diane Burman Riis, MD    Current Outpatient Medications:   ???  albuterol 2.5 mg /3 mL (0.083 %) nebulizer solution, Inhale 3 mL (2.5 mg total) by nebulization 4 (four) times a day., Disp: 360 mL, Rfl: 11  ???  busPIRone (BUSPAR) 15 MG tablet, Take 1 tablet (15 mg total) by mouth Three (3) times a day., Disp: 90 tablet, Rfl: 1  ???  cholecalciferol, vitamin D3, 5,000 unit tablet, Take 1.5 tablets (7,500 Units total) by mouth daily., Disp: 30 tablet, Rfl: 0  ???  dornase alfa (PULMOZYME) 1 mg/mL nebulizer solution, Inhale the contents of 1 vial (2.5 mg) via nebulizer once daily., Disp: 225 mL, Rfl: 3  ???  gabapentin (NEURONTIN) 300 MG capsule, Take 4 capsules (1200mg ) in the morning and 3 capsule (900mg ) at 12p and bedtime., Disp: 300 capsule, Rfl: 1  ???  ibuprofen (ADVIL,MOTRIN) 200 MG tablet, Take 800 mg by mouth 4 (four) times a day as needed. , Disp: , Rfl:   ???  insulin ASPART (NOVOLOG FLEXPEN) 100 unit/mL (3 mL) injection pen, Inject 0.14 mL (14 Units total) under the skin Three (3) times a day before meals., Disp: 12.6 mL, Rfl: 0  ???  insulin glargine (LANTUS) 100 unit/mL injection, Inject 0.3 mL (30 Units total) under the skin nightly., Disp: 9 mL, Rfl: 0  ???  lipase-protease-amylase (ZENPEP) 20,000-63,000- 84,000 unit CpDR capsule, delayed release, Take 12 capsules by mouth Three (3) times a day with a meal. 12 caps with meals and 3 caps with snacks, Disp: , Rfl:   ???  magnesium oxide (MAG-OX) 400 mg (241.3 mg magnesium) tablet, Take 2 tablets (800 mg total)  by mouth daily., Disp: 60 tablet, Rfl: 11  ???  melatonin 3 mg Tab, Take 2 tablets (6 mg total) by mouth every evening., Disp: 30 tablet, Rfl: 0  ???  methyl salicylate-menthol Oint, Apply 1 application topically 4 (four) times a day as needed., Disp: 1 Tube, Rfl: 1  ???  mirtazapine (REMERON) 45 MG tablet, Take 1 tablet (45 mg total) by mouth nightly., Disp: 30 tablet, Rfl: 1  ???  MVW Complete, pediatric multivit 61-D3-vit K, 1,500-800 unit-mcg cap, Take 2 capsules by mouth daily., Disp: 60 each, Rfl: 1  ???  nebulizers (LC PLUS) Misc, Pari LC Plus Reusable Cup, Disp: 2 each, Rfl: 11  ???  nicotine (NICODERM CQ) 21 mg/24 hr patch, Place 1 patch on the skin daily., Disp: 28 patch, Rfl: 0  ???  OLANZapine (ZYPREXA) 15 MG tablet, Take 1 tablet (15 mg total) by mouth nightly., Disp: 30 tablet, Rfl: 1  ???  pantoprazole (PROTONIX) 40 MG tablet, Take 1 tablet (40 mg total) by mouth Two (2) times a day., Disp: 180 tablet, Rfl: 2  ???  phytonadione, vitamin K1, (MEPHYTON) 5 mg tablet, Take 1 tablet (5 mg total) by mouth Every Wednesday and Saturday., Disp: 8 tablet, Rfl: 11  ???  polyethylene glycol (MIRALAX) 17 gram packet, Take 17 g by mouth daily as needed., Disp: 100 each, Rfl: 0  ???  sodium chloride 7% 7 % Nebu, Inhale 4 mL by nebulization two (2) times a day., Disp: 720 mL, Rfl: 3  ???  traZODone (DESYREL) 50 MG tablet, Take 1 tablet (50 mg total) by mouth nightly., Disp: 30 tablet, Rfl: 1    Allergies  Patient has no known allergies.    Family History   Problem Relation Age of Onset   ??? Diabetes Maternal Grandmother    ??? Diabetes Maternal Grandfather    ??? No Known Problems Mother    ??? Cancer Father    ??? No Known Problems Sister        Social History  Social History     Tobacco Use   ??? Smoking status: Former Smoker     Types: Cigarettes   ??? Smokeless tobacco: Never Used   ??? Tobacco comment: quit several months ago   Substance Use Topics   ??? Alcohol use: Not Currently     Alcohol/week: 0.0 standard drinks   ??? Drug use: Not Currently     Comment: hx of alcohol and prescription pain medication abuse         Physical Exam     ED Triage Vitals   Enc Vitals Group      BP 04/05/18 2303 124/83      Heart Rate 04/05/18 2303 117      SpO2 Pulse 04/06/18 0102 96      Resp 04/05/18 2303 24      Temp 04/05/18 2303 37 ??C (98.6 ??F)      Temp Source 04/05/18 2303 Skin      SpO2 04/05/18 2303 (!) 86 %      Weight --       Height --       Head Circumference --       Peak Flow --       Pain Score --       Pain Loc --       Pain Edu? --       Excl. in GC? --        Constitutional: Chronically ill-appearing, NAD.  Eyes: Conjunctivae  are normal.  ENT       Head: Normocephalic and atraumatic.       Nose: No congestion.       Mouth/Throat: Mucous membranes are moist.       Neck: No stridor.  Hematological/Lymphatic/Immunilogical: No cervical lymphadenopathy.  Cardiovascular: rate as vital signs, regular rhythm. Normal and symmetric distal pulses are present in all extremities.  Respiratory: crackles over the left midlung.  Patient has tenderness to palpation over the bilateral lower ribs, reproducible on palpation. Satting 94 on 4L  Gastrointestinal: Soft and nontender. No rigid abdomen present.  Genitourinary: Deferred  Musculoskeletal: No tenderness or edema of bilateral LEs.  Neurologic: Normal speech and language. No gross focal neurologic deficits are appreciated.  Skin: Skin is warm, dry and intact. No rash noted.  Psychiatric: Mood and affect are normal. Speech and behavior are normal.    Radiology     XR Chest 2 views   Preliminary Result      -Findings consistent with cystic fibrosis. No focal consolidation.          Pertinent labs & imaging results that were available during my care of the patient were reviewed by me and considered in my medical decision making (see chart for details).     Please note- This chart has been created using [employer]. Chart creation errors have been sought, but may not always be located and such creation errors, especially pronoun confusion, do NOT reflect on the standard of medical care.          Raynald Blend, MD  Resident  04/07/18 [phone]

## 2018-04-06 NOTE — Unmapped (Signed)
Patient rounds completed. The following patient needs were addressed:  Pain, Toileting, Personal Belongings, Plan of Care, Call Bell in Reach and Bed Position Low .

## 2018-04-06 NOTE — Unmapped (Signed)
Patient rounds completed. The following patient needs were addressed:  Pain, Toileting, Positioning;   SUPINE, Personal Belongings, Plan of Care, Call Bell in Reach and Bed Position Low. HOB elevated for comfort. Cup of water provided as requested. Bed wheels locked. Side rails in use x2. Call bell within patient's reach. All personal belongings at bedside. Patient updated, will be moved to private room in D. [place] in place. No respiratory distress noted. Equal rise and fall of chest. Breathing easy and unlabored. No needs expressed at this time.

## 2018-04-06 NOTE — Unmapped (Signed)
Report given to [facility].

## 2018-04-06 NOTE — Unmapped (Signed)
Paged admit team regarding pt's pain.

## 2018-04-06 NOTE — Unmapped (Signed)
Bed: 81-D  Expected date:   Expected time:   Means of arrival:   Comments:  For rm 17

## 2018-04-06 NOTE — Unmapped (Signed)
Pt refused ABG draw.

## 2018-04-06 NOTE — Unmapped (Signed)
Nephrology at bedside

## 2018-04-06 NOTE — Unmapped (Signed)
Cystic Fibrosis Nutrition Assessment    Inpatient: MD Consult this admission and related follow up   Primary Pulmonary Provider: Dr. Lurena Nida  ==================================================================  Walter [name] is a 37 y.o. male   Currently in the ED with elevated blood glucose levels - per MD H&P On presentation, glucose 1,190 with elevated anion gap 16 and elevated beta-hydroxybutyrate after drinking 4 sodas in ED waiting room.  Walter [name] reported to ED staff occasionally taking lantus but no other insulin.  - Currently NPO and on insulin gtt.  ===================================================================  INTERVENTION:  1. As medically appropriate recommend advance diet as tolerated with goal of High Calorie High Protein diet.     - Once diet advanced, could consider starting PO supplement - 1 Ensure Plus daily to help meet increased nutritional needs but with lower potassium & carbohydrate content than SuperShakes.  (1 Ensure Plus provides 350 kcals, 13 grams protein, 50 grams carbohydrate, 400 mg potassium where as 1 SuperShake provides ~ 630 kcals, 14 grams protein, 81 grams carbohydrate, 729 mg potassium)    - Note during previous admit Walter [name] had elevated K+ levels. During that admit RD provided verbal/written education re: potassium content of foods available on [facility] menu as well as education for home.    2. Pancreatic enzyme regimen (Zenpep) is not needed while NPO or if advanced to clear liquid diet. Zenpep would need to be resumed when advanced to full liquid diet and/or solid foods.   - Walter [name] home enzyme regimen Zenpep 40,000 is not available on inpatient drug formulary, typically substitute with Zenpep 20,000 during admissions.  - Based on current weight of 70.7 kg -  Recommend decrease Zenpep 20,000 to  (9 at meals, 5 at snacks) provides 2,545 units lipase/kg/meal & 11,881 units lipase/kg/day.    3. Continue CF vitamin regimen:  - MVW Complete Formulation capsules 2 daily - during admission  - vitamin D3/cholecalciferol 7,500 units daily - during admission    4. PT WNL on admit - agree with 5mg  phytonadione twice weekly only during admission.    5. Weigh patient twice weekly this admit.    6. Continue remainder of nutrition regimen:  - acid reducer    - insulin regimen per MD team  - bowel regimen   - remeron    Inpatient:   Will follow up with patient per protocol: 1-2 times per week (and more frequent as indicated)  ===================================================================  ASSESSMENT:  Cystic Fibrosis Nutrition Category = Urgent Need      NPO order appropriate at this time. No need for pancreatic enzyme regimen while NPO. [name] Suspect weight changes this year could be multifactorial - poor glucose management (HGB A1Cs have ranged from 8.4% to 11.9% since November 2018) , variable PO intake, increase needs related to pulmonary status (FEV1 35% in April 2019, noting occasional need for supplemental oxygen).    Malnutrition Assessment using AND/ASPEN Clinical Characteristics:  Deferred NFPE.     Goals:  1. Meet estimated daily needs: 3314 kcals while inpatient considering lower activity factor, recommend increase to 4141 kcals when outpatient considering higher activity factor; 84-112 gm pro; 2514 mL free water      Calories estimated using: Cystic Fibrosis Conference Formula, fluid per Holiday Segar, protein per DRI x 1.5-2  2. Reach/maintain established goals for CF:                Adult - BMI 22kg/m2 for CF females and 23kg/m2 for CF males    Pediatric - BMI or wt/ht  ratio > 50%ile for age  49. Normal fat-soluble vitamin levels: Vitamin A, Vitamin E and PT per lab range; Vitamin D 25OH total >30  4. Maintain glucose control. Carbohydrate content of diet should comprise 40-50% of total calorie needs, but carbohydrates are not restricted in this population.    5. Meet sodium needs for CF  ===================================================================  INPATIENT: Current Nutrition Orders (inpatient):       Nutrition Orders   (From admission, onward)             Start     Ordered    04/06/18 0711  NPO Sips with ice chips; Medically necessary  Effective now     Comments:  May drink water   Question Answer Comment   NPO Except: Sips with ice chips    Reason: Medically necessary        04/06/18 0710              Nutrition Hx:  lanuts, lispro; home Zenpep 40,000 (5 at meals, 3 at snacks - provides 2828 units lipase/kg/meal & 08657 units lipase/kg/day) substitute while inpatient with Zenpep 20,000, did not tolerate Creon;  2 MVW complete formulation D30000 chewables daily; miralax, protonix, ursodisol; Hx of drinking chocolate Ensure Plus during admits; remeron for appetite. Hx Alcohol Abuse noted.    CF Nutrition related medications (inpatient): Nutritionally relevant medications reviewed.   7500 International units vitamin D3 daily  Insulin gtt  Lactulose BID  Zenpep 20,000 (12 at meals)  Magnesium oxide  Remeron 45 mg nightly  2 MVW Complete Formulation capsules daily  protonix  5 mg phytonadione twice weekly  miralax 17 grams BID  IV fluits 1/2NS at 150 mL/hr  miralax PRN    CF Nutrition related labs (inpatient):   04-06-18: Glu 1,190  - elevated, Na+ 123 - low, K+ 5.6 - elevated, CRP 53 - elevated, PT 12 - WNL  Recent Labs   Lab Units 04/06/18  0651 04/06/18  0534 04/06/18  0330   POC GLUCOSE mg/dL 846* 962* >952*   ==================================================================  CLINICAL DATA:  Past Medical History:   Diagnosis Date   ??? Alcohol abuse    ??? 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 Bipolar disorder (CMS-HCC)    ??? Chronic pancreatitis (CMS-HCC)    ??? Chronic sinusitis    ??? 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 Cystic fibrosis (CMS-HCC)    ??? Diabetes mellitus (CMS-HCC)     dx in-house 2015   ??? Disease of thyroid gland    ??? Hypertension    ??? Kidney stones    ??? Portal hypertension (CMS-HCC)      Anthroprometric Evaluation:  Weight changes: Weight is down 11 pounds (6%) over the last 1 month. Overall weight remains down 23 pounds from highest weight this year (January 2019).    There were no vitals filed for this visit.  BMI Readings from Last 3 Encounters:   03/28/18 20.56 kg/m??   02/28/18 21.93 kg/m??   02/01/18 22.16 kg/m??     Wt Readings from Last 12 Encounters:   03/28/18 70.7 kg (155 lb 12.8 oz)   02/28/18 75.4 kg (166 lb 3.2 oz)   02/01/18 76.2 kg (167 lb 15.9 oz)   01/05/18 67.9 kg (149 lb 9.6 oz)   11/21/17 65.4 kg (144 lb 2.9 oz)   09/29/17 67.6 kg (149 lb)   09/07/17 70.4 kg (155 lb 4.8 oz)   07/30/17 72.6 kg (160 lb)  07/11/17 80.9 kg (178 lb 4.8 oz)   06/30/17 74.8 kg (165 lb)   06/20/17 75.2 kg (165 lb 12.6 oz)   06/05/17 72.6 kg (160 lb)     Ht Readings from Last 3 Encounters:   03/28/18 185.4 cm (6' 0.99)   02/28/18 185.4 cm (6' 0.99)   01/28/18 185.4 cm (6' 1)   ==================================================================  Fat-soluble vitamin levels: low related to chronic non-compliance  Lab Results   Component Value Date/Time    VITAMINA 22.4 (L) 03/19/2016 1529     Lab Results   Component Value Date/Time    VITDTOTAL 23.2 01/29/2018 2048    VITDTOTAL 20.7 11/17/2017 1054    VITDTOTAL 9.5 (L) 04/20/2017 0531    VITDTOTAL 23.1 02/13/2017 0558    VITDTOTAL 8.8 (L) 02/02/2017 0534    VITDTOTAL 20 03/19/2016 1529     Lab Results   Component Value Date/Time    VITAME 4.5 (L) 03/19/2016 1529     Lab Results   Component Value Date/Time    PT 12.0 04/06/2018 0332    PT 10.9 01/26/2018 1230    PT 13.1 (H) 01/12/2018 1140    PT 10.4 11/23/2017 1156    PT 15.5 (H) 11/17/2017 1518    PT 11.1 10/31/2010 1115     Bone Health: Abnormal vitamin D level, being treated. 2011 DEXA showed normal bone density.    CF Related Diabetes: Yes. Prescribed insulin regimen, however note non-compliance.  Lab Results   Component Value Date/Time    A1C 9.6 (H) 01/12/2018 0340    A1C 8.8 (H) 11/25/2015 1426    A1C 9.3 (H) 04/16/2014 1604

## 2018-04-06 NOTE — Unmapped (Signed)
Pt in bed, calm and cooperative, independently ambulated to the restroom, cardiac and O2 monitor on, meds given per order. Patient rounds completed. The following patient needs were addressed:  Pain, Toileting, Personal Belongings, Plan of Care, Call Bell in Reach and Bed Position Low .

## 2018-04-06 NOTE — Unmapped (Signed)
Patient reports exacerbation of CF, hurts when I breath. Increased cough and sputum production that has been going on for a few days.

## 2018-04-06 NOTE — Unmapped (Signed)
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    HHS, CF-related diabetes mellitus: Follows with endocrinology. A1c 9.6% (12/2017). Reports very poor adherence to insulin regimen, occasionally takes lantus but doesn't take any other insulin. On presentation, glucose 1,190 with elevated anion gap 16 and elevated beta-hydroxybutyrate after drinking 4 sodas in ED waiting room. Discussed with ED team that he will need insulin gtt initially and to be transitioned off prior to being able to go to a floor/stepdown bed, otherwise will need ICU. Insulin regimen from prior admission: lantus 30U nightly, lispro 14U TID AC, SSI lispro.  - insulin gtt  - NS 500cc/hr with K per liter  - q1h POC glucose checks, q2h BMP  - insulin gtt until sugar improves and gap closes, discussed with ED provider    Hypoxia, CF with pulmonary manifestations with bronchiectasis exacerbation: ?F-508/?I-507, primary pulmonologist is Dr. Lurena Nida. FEV1 35% (09/2017). Prior cultures with mucoid and smooth PsA, H flu, achromobacter. CXR on admission without focal consolidation.  - notify Dr. Lurena Nida of admission  - Microbiology data:   - CF sputum Cx   - AFB Cx   - fungal Cx   - flu negative  - Abx: continue mero + ceftaroline + ciprofloxacin, could change regimen if Cr improves  - Airway clearance:   - 7% HTS and albuterol nebs QID   - vest QID  - pulmozyme daily  - f/u CRP  - PFTs when able    AKI vs CKD, hyperkalemia: AKI and hyperkalemia during prior admission as well. I suspect there is some diabetes-related CKD, but that much of this is AKI related to dehydration in setting of HHS & prolonged NSAID use.  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 Currently with  - evaluate for ascites via [place] in AM, needs diagnostic paracentesis if present  - consider hepatology consult  - daily CMP, INR  - consider HCC screening and variceal screening if not up to date    Asterixis: severe bilateral asterixis in ED. Possibly 2/2 cirrhosis w/ HE vs toxic-metabolic in setting of AKI & high-dose gabapentin, as well as HHS  - check ammonia level  - empiric lactulose, titrate to 3-5 soft stools/day  - hold gabapentin  - treat HHS as above  - CTM clinically    BRBPR: 1-2 day history of BRBPR with bowel movements. He suspects this is due to hemorrhoids, which is likely the case given  significant constipation. Hgb is stable from priors.  - CTM Hgb  - lactulose BID  - hydrocortisone cream PRN    Anxiety: currently reports taking gabapentin for anxiety. Reports anxiety is worse when not breathing. Previously on Xanax and would like to restart that but reports it was stopped after a suicide attempt.  - Psych consult in AM  - would benefit from coordinating with CF SW  - buspar  - trazodone    CF-related exocrine pancreatic insufficiency: has not been taking zenpep with meals consistently  - zenpep 12 capsules with meals  - MVW, vitD  - nutrition consult    Chronic back pain, improved: Chronic pain team consulted during prior admission, recommended against opioids. Has L5-S1 disc herniation on imaging, to which pain has been attributed. Reports this is now 100% better.  - hold gabapentin, he's not using this for back pain but for anxiety  - no longer taking nortriptyline  - lidocaine patches  - tylenol PRN    Rash: involving neck and upper chest/shoulders. ?pytiriasis versicolor  - monitor response to ketoconazole    Checklist:  FEN: NPO pending resolution of HHS  DVT PPx: SQH  GI PPx: home PPI  Access: PIV  Code status: full code  Dispo: admit to Med G, floor status  ___________________________________________________________________    Chief Complaint:  Chief Complaint   Patient presents with   ??? Cough     Cystic fibrosis with pulmonary exacerbation (CMS-HCC)    HPI:  Walter [name] is a 37 y.o. male with PMHx as reviewed in the EMR that presented to [facility] with Cystic fibrosis with pulmonary exacerbation (CMS-HCC).    He reports very poor compliance with diabetes regimen, with very elevated blood sugar levels at home often just reading high on his glucometer. He has been taking his lantus 24 units often but not daily, and almost never takes any other forms of insulin. He drank 4 sodas from the vending machine while in the ED waiting room. He doesn't drink diet soda because he doesn't like the taste. He admits he should do better with diabetes management. He has been urinating frequently, denies dysuria, change in appearance or odor. He endorses frequent nausea, and has been losing weight.    He presented today because he has had progressive dyspnea, worse with exertion over past few weeks. Increased cough and feels he has secretions rattling in his chest but hasn't been able to cough up the sputum. Does airway clearance with vest, albuterol, 7%HTS BID. Scant streaks of blood in sputum. Roommate was sick 1 week ago with a cold. Denies fevers at home. Did get flu shot this year. No oxygen at home. No oxygen meter so doesn't know if he's been hypoxic at home. At his best after recent discharge was able to walk up stairs but would get short of breath. H    Endorses constipation with straining and small pellet-like stools, hemorrhoidal bleeding starting yesterday with small amount of bright red blood in stools.    Takes ibuprofen 200mg  x4 tabs once daily. Denies supplements/herbals.    Tobacco: exposed to secondhand smoke daily from housemate, prior smoker quit several months ago  Alcohol: prior heavy drinker, hadn't drank since 07/30/17, had a slip up July 25th but reports he has not drank since  Illicit drugs: no    Lives in [place] with two roomates.  Occupation: was working for [employer] but fired 3 days ago because of frequent absences during hospitalization.  Allergies:  Patient has no known allergies.    Medications:   Prior to Admission medications    Medication Dose, Route, Frequency   albuterol 2.5 mg /3 mL (0.083 %) nebulizer solution 2.5 mg, Nebulization, 4 times daily (RT)   busPIRone (BUSPAR) 15 MG tablet 15 mg, Oral, 3 times a day (standard)   cholecalciferol, vitamin D3, 5,000 unit tablet 7,500 Units, Oral, Daily (standard)   dornase alfa (PULMOZYME) 1 mg/mL nebulizer solution 2.5 mg, Inhalation, Daily (RT)   gabapentin (NEURONTIN) 300 MG capsule Take 4 capsules (1200mg ) in the morning and 3 capsule (900mg ) at 12p and bedtime.   ibuprofen (ADVIL,MOTRIN) 200 MG tablet 800 mg, Oral, 4 times daily PRN   insulin ASPART (NOVOLOG FLEXPEN) 100 unit/mL (3 mL) injection pen 14 Units, Subcutaneous, 3 times a day (AC)   insulin glargine (LANTUS) 100 unit/mL injection 30 Units, Subcutaneous, Nightly   lipase-protease-amylase (ZENPEP) 20,000-63,000- 84,000 unit CpDR capsule, delayed release 12 capsules, Oral, 3 times a day (with meals), 12 caps with meals and 3 caps with snacks    magnesium oxide (MAG-OX) 400 mg (241.3 mg magnesium) tablet 800 mg, Oral, Daily (standard)   melatonin 3 mg Tab 6 mg, Oral, Every evening   methyl salicylate-menthol Oint 1 application, Topical, 4 times daily PRN   mirtazapine (REMERON) 45 MG tablet 45 mg, Oral, Nightly   MVW Complete, pediatric multivit 61-D3-vit K, 1,500-800 unit-mcg cap 2 capsules, Oral, Daily (standard)   nebulizers (LC PLUS) Misc Pari LC Plus Reusable Cup   nicotine (NICODERM CQ) 21 mg/24 hr patch 1 patch, Transdermal, Daily (standard)   OLANZapine (ZYPREXA) 15 MG tablet 15 mg, Oral, Nightly   pantoprazole (PROTONIX) 40 MG tablet 40 mg, Oral, 2 times a day   phytonadione, vitamin K1, (MEPHYTON) 5 mg tablet 5 mg, Oral, Every Wed,Sat   polyethylene glycol (MIRALAX) 17 gram packet 17 g, Oral, Daily PRN   sodium chloride 7% 7 % Nebu 4 mL, Nebulization, 2 times a day   traZODone (DESYREL) 50 MG tablet 50 mg, Oral, Nightly       Medical History:  Past Medical History:   Diagnosis Date   ??? Alcohol abuse    ??? 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 Bipolar disorder (CMS-HCC)    ??? Chronic pancreatitis (CMS-HCC)    ??? Chronic sinusitis    ??? 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 Cystic fibrosis (CMS-HCC)    ??? Diabetes mellitus (CMS-HCC)     dx in-house 2015   ??? Disease of thyroid gland    ??? Hypertension    ??? Kidney stones    ??? Portal hypertension (CMS-HCC)        Surgical History:  Past Surgical History:   Procedure Laterality Date   ??? CHOLECYSTECTOMY  2008   ??? sinus surgery     ??? TONSILLECTOMY         Social History:  Social History     Socioeconomic History   ??? Marital status: Single     Spouse name: Not on file   ??? Number of children: 0   ??? Years of education: 12+   ??? Highest education level: Bachelor's degree (e.g., BA, AB, BS)   Occupational History   ??? Occupation: Restaurant manager, fast food     Comment: works at [employer]   ??? Financial resource strain: Very hard   ??? Food insecurity:     Worry: Often true     Inability:  Often true   ??? Transportation needs:     Medical: No     Non-medical: No Tobacco Use   ??? Smoking status: Former Smoker     Types: Cigarettes   ??? Smokeless tobacco: Never Used   ??? Tobacco comment: quit several months ago   Substance and Sexual Activity   ??? Alcohol use: Not Currently     Alcohol/week: 0.0 standard drinks   ??? Drug use: Not Currently     Comment: hx of alcohol and prescription pain medication abuse   ??? Sexual activity: Not Currently   Lifestyle   ??? Physical activity:     Days per week: 0 days     Minutes per session: 0 min   ??? Stress: Very much   Relationships   ??? Social connections:     Talks on phone: Twice a week     Gets together: Once a week     Attends religious service: Never     Active member of club or organization: No     Attends meetings of clubs or organizations: Never     Relationship status: Never married   Other Topics Concern   ??? 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 Hillard Danker with [facility] in [place]    Current therapist: Nehemiah Settle with CF team        SUBSTANCE ABUSE HX:     # MJ     -Current use: once in awhile    # Alcohol     -Current use: Was sober from 2016 until two weeks ago. Admits to drinking enough to get drunk and admits to passing out. Denies history of complicated withdrawal. Admits to withdrawal symptoms of tremors, anxiety, headache, and tactile hallucinations at times.     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 Diabetes Maternal  Grandmother    ??? Diabetes Maternal Grandfather    ??? No Known Problems Mother    ??? Cancer Father    ??? No Known Problems Sister        Review of Systems:  10 systems reviewed and are negative unless otherwise mentioned in HPI    Labs/Studies:  Labs and Studies from the last 24hrs per EMR and Reviewed    Physical Exam:  Temp:  [37 ??C] 37 ??C  Heart Rate:  [96-117] 102  SpO2 Pulse:  [96] 96  Resp:  [22-24] 22  BP: (124-160)/(83-100) 160/100  SpO2:  [86 %-98 %] 98 %,     Intake/Output Summary (Last 24 hours) at 04/06/2018 0519  Last data filed at 04/06/2018 0430  Gross per 24 hour   Intake 427 ml   Output ???   Net 427 ml   , There is no height or weight on file to calculate BMI.,   Wt Readings from Last 3 Encounters:   03/28/18 70.7 kg (155 lb 12.8 oz)   02/28/18 75.4 kg (166 lb 3.2 oz)   02/01/18 76.2 kg (167 lb 15.9 oz)       General: Patient resting comfortably in bed, NAD  HEENT: EOMI, PERRL, MMM, neck supple, no lymphadenopathy  CV: Regular rate and rhythm, no murmurs rubs or gallops, audible S1 and S2  Pulm: Coarse breath sounds with poor air movement throughout  Abd: Soft, nontender, nondistended abdomen. No palpable masses or organomegaly. Normal active bowel sounds.  Ext: Warm and well perfused. No edema. Good capillary refill.  Skin: Rash on neck and upper chest.  Neuro: Patient fully alert and cooperative with examination. Prominent asterixis.

## 2018-04-06 NOTE — Unmapped (Signed)
Paged admit team regarding pt's NPO status.

## 2018-04-06 NOTE — Unmapped (Signed)
Pt in bed with eyes closed, cardiac and O2 monitor on, breathing is regular and unlabored. Patient rounds completed. The following patient needs were addressed:  Pain, Toileting, Personal Belongings, Plan of Care, Call Bell in Reach and Bed Position Low .

## 2018-04-06 NOTE — Unmapped (Signed)
Placed on 4L [place] and now 93%.

## 2018-04-07 LAB — COMPREHENSIVE METABOLIC PANEL
ALBUMIN: 3.3 g/dL — ABNORMAL LOW (ref 3.5–5.0)
ALKALINE PHOSPHATASE: 353 U/L — ABNORMAL HIGH (ref 38–126)
ANION GAP: 10 mmol/L (ref 7–15)
AST (SGOT): 37 U/L (ref 19–55)
BILIRUBIN TOTAL: 0.6 mg/dL (ref 0.0–1.2)
BLOOD UREA NITROGEN: 11 mg/dL (ref 7–21)
BUN / CREAT RATIO: 9
CALCIUM: 8.2 mg/dL — ABNORMAL LOW (ref 8.5–10.2)
CHLORIDE: 102 mmol/L (ref 98–107)
CO2: 26 mmol/L (ref 22.0–30.0)
CREATININE: 1.29 mg/dL (ref 0.70–1.30)
EGFR CKD-EPI AA MALE: 82 mL/min/{1.73_m2} (ref >=60)
EGFR CKD-EPI NON-AA MALE: 71 mL/min/{1.73_m2} (ref >=60)
GLUCOSE RANDOM: 219 mg/dL — ABNORMAL HIGH (ref 65–179)
POTASSIUM: 4.1 mmol/L (ref 3.5–5.0)
PROTEIN TOTAL: 6.5 g/dL (ref 6.5–8.3)
SODIUM: 138 mmol/L (ref 135–145)

## 2018-04-07 LAB — PROTIME-INR: INR: 1.05

## 2018-04-07 LAB — BUN / CREAT RATIO: Urea nitrogen/Creatinine:MRto:Pt:Ser/Plas:Qn:: 9

## 2018-04-07 LAB — INR: Lab: 1.05

## 2018-04-07 NOTE — Unmapped (Signed)
Received report from [employer].

## 2018-04-07 NOTE — Unmapped (Signed)
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  Walter [name] is a 37 y.o. 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     Hyperglycemia, CF related DM: A1C 7/19 of 9.6% and has been taking home lantus about 3/week due to not concentrating on his DM diagnosis in setting of other comborbidities including CF. AG closed and glucoses better controlled with morning glucose around 200. Resuming home nightly Lantus, home meal time 14 U, SSI  -endo recs appreciated    Hypoxia in setting of CF bronchiectasis exacerbation: Currently on 4L [place]. Recent FEV1 of 35%. Prior cultures with mucoid and smooth PsA, H flu, achromobacter. He has been unable to produce sputum for culture studies, RPP negative. 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 He has not experienced any change in swelling lately. AST (37) ALT (21), elevated ALP to 353 and INR within normal limits.   - trend ALP  - abdominal ultrasound?  - HCC and variceal screening on discharge    Anxiety: struggles with anxiety and has a history of suicide attempt. We are continuing home meds which include Buspar and Trazodone. Psych consult with recs appreciated.     CF related pancreatic insufficiency: Zen pep regimen, vitamin regimen, vitamin K twice a week.    Hypopigmented rash: pytriasis versicolor vs. [place]o reticularis appearance  -ketoconazole topical     LOS: 1 day     Subjective:    Still complaining of shortness of breath that is slightly improved and having pain in lower ribs with deep breaths. He has been unable to produce sputum for cultures.     Objective:    Vital signs in last 24 hours:  Temp:  [36.4 ??C-36.6 ??C] 36.4 ??C  Heart Rate:  [73-83] 76  SpO2 Pulse:  [75-99] 99  Resp:  [16-18] 17  BP: (101-135)/(58-96) 113/64  MAP (mmHg):  [90-107] 107  SpO2:  [90 %-99 %] 96 %    Labs: Reviewed    Medications Changes:    Imaging:Reviewed    Physical Exam:  General Appearrance: Alert and oriented x3, NAD, chronically ill appearing  HEENT: EOMI, PERRL, anicteric.  Heart: RRR, no murmurs rubs or gallops, normal S1 and S2.   Lungs: coarse breath sounds with poor air movement bilaterally, no focal sounds appreciated.  Abdomen:  soft NT ND  Extremities: No pitting edema, no tenderness to palpation.  Psych: Flat affect, normal speech.  Skin: hypopigmented rash on neck

## 2018-04-07 NOTE — Unmapped (Signed)
Patient rounds complete. Airway intact, breathing even and unlabored and color appropriate for ethnicity. Pt in NAD. The following needs have been addressed: stretcher low and locked, call light within reach, pt belongings addressed, pain, and toileting. Will continue to monitor and wait for further orders from [facility].

## 2018-04-07 NOTE — Unmapped (Signed)
[facility]   Initial Psychiatry Consult Note     Service Date: April 07, 2018  LOS:  LOS: 1 day      Assessment:   Walter [name] is a 37 y.o. 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  Patient was seen in consultation by Psychiatry at the request of Rolan Lipa, MD with [facility] for evaluation of Anxiety.    The patient's current presentation of worries about a variety of subjects with associated muscle tension, fatigue, difficulties concentrating, irritability, sleep disturbances is most consistent with unspecified anxiety disorder (Generalized Anxiety Disorder should also be considered, but patient's associated symptoms may be more related to current medical conditions contributing to the majority of his associated symptoms). Patient also reports depressed mood that he relates to losing his job last week. Patient with history of bipolar disorder, but has had more difficulties with depression in recent past. Would recommend re-starting patient's home gabapentin and adding zyprexa 2.5 mg BID PRN anxiety for patient's anxiety. Gabapentin is renally metabolized and current creatine clearance is normal. Would also obtain UDS as patient historically has had substance use disorders though currently denying use. Patient is not a candidate for benzodiazepine therapy as he has had suicide attempt with benzodiazepines in the past.     Please see below for detailed recommendations.    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)    Bipolar disorder, most recent episode depressed (CMS-HCC)    Cystic fibrosis with liver disease (CMS-HCC)    Bronchiectasis with acute exacerbation (CMS-HCC)    Malnutrition (CMS-HCC)    Alcohol use disorder, severe, in early remission, in controlled environment (CMS-HCC)    Anxiety disorder, unspecified (r/o generalized anxiety disorder)       Problems edited/added by me:  Problem   Anxiety disorder, unspecified (r/o generalized anxiety disorder)   Bipolar Disorder, Most Recent Episode Depressed (Cms-Hcc)       Safety Risk Assessment:  A suicide and violence risk assessment was performed as part of this evaluation. Risk factors for self-harm/suicide: history of substance abuse, history of depression, male age 46-35, previous acts of self-harm, chronic severe medical condition and chronic mental illness > 5 years.  Protective factors against self-harm/suicide:  lack of active SI, no know access to weapons or firearms, no previous suicide attempts in the last 6 months, currently receiving mental health treatment, supportive friends, safe housing and presence of a safety plan with follow-up care.  Risk factors for harm to others: high emotional distress, past substance abuse and male gender.  Protective factors against harm to others: no known violence towards others in the last 6 months, no commands hallucinations to harm others in the last 6 months, no active symptoms of psychosis, no active symptoms of mania and positive social orientation.  While future psychiatric events cannot be accurately predicted, the patient is not currently at elevated imminent risk, and is at elevated chronic risk of harm to self and is not currently at elevated imminent risk, and is at elevated chronic risk  of harm to others.       Recommendations:   ## Safety:   -- No acute saftey concerns.  Please see safety assessment for further discussion.    ## Medications:   -- Continue home Zyprexa 15 mg qHS  -- START Zyprexa 2.5 mg BID PRN anxiety  -- Continue home mirtazapine 45 mg qHS  -- Continue home buspar 15 mg TID  -- START home gabapentin 1200 mg qAM, 900 mg qPM, 900 mg qHS  -- Continue home Trazodone 50 mg qHS  -- Would avoid benzodiazepines given possible history of benzodiazepine use disorder and previous suicide attempt with overdose of Xanax   -- While the patient is receiving medications (such as zyprexa) that may prolong QTc and increase risk for torsades:      - MONITOR and KEEP Mg>2 and K>4      - MONITOR QTc regularly.  If QTc on tele strip >491ms, obtain 12-lead EKG.      ## Medical Decision Making Capacity:   -- A formal capacity assessement was not performed as a part of this evaluation.  If specific capacity questions arise, please contact our team as below.     ## Further Work-up:   -- Given his history of possible BZD abuse, every time patient comes to the hospital, he should get UDS. If positive for opiates and BZD, obtain confirmation panel to see what he is using.     ## Disposition:   -- The patient currently receives mental health care with [facility].  Assessing interest / ability to continue seeing this provider.    ## Behavioral / Environmental:   -- Although not currently delirious, the patient is at an elevated risk for developing delirium given multiple medical conditions, infection, pain, and treatment with multiple medications. Implement delirium precautions:  -- Please order Delirium (prevention) protocol: the following can be copied into a single misc nursing order.        - RN to open blinds every morning        - To bedside: glasses, hearing aide, patient's own shoes. Make available to patient's when possible and encourage use        - RN to assess orientation (person, place, & time) qam and prn, with frequent reorientation (verbal & whiteboard) & introduction of caregivers. ??         - Recommend extended visiting hours with familiar family/friends as feasible        - Encourage normal sleep-wake cycle by promoting a dark, quiet environment at night and stimulating, light environment during the day. ??        - Turn the TV off when patient is asleep or not in use.    Thank you for this consult request. Recommendations have been communicated to the primary team.  We will follow as needed at this time. Please page [phone] or [phone] (after hours/weekends) for any questions or concerns.     Discussed with and seen by Fellow, Murray Hodgkins.  Discussed with and seen by Attending Torrie Mayers, MD, who agrees with the assessment and plan.    Ree Kida Register, MD  I saw and evaluated the patient, participating in the key portions of the service.  I reviewed and edited the resident???s note.  I agree with the resident???s findings and plan.     Elray Buba, MD        History:   Relevant Aspects of Hospital Course:   Admitted  on 04/06/2018 for CF with pulmonary exacerbation and hyperglycemic hyperosmotic state. When patient presented to the ED his glucose was 1190. Patient initially maintained on insulin ggt and was then transitioned to subQ insulin when blood sugar was more regulated. Patient was hydrated and creatinine normalized. CF Sputum Cx, AFB Cx, Fungal Cx sent. Respiratory Virus panel negative. Patient started on meropenem, ceftaroline, and ciprofloxacin.    Patient Report:   Patient states that he is having crazy anxiety but that it is a waste of time to speak to psychiatry. He states that he is tired of making medication adjustments that don't work and that a benzo would fix everything. Patient states that he was well controlled for his anxiety for several years on Xanax TID but then he entered dark times and tried to overdose with the Xanax. Since then, no one will re-prescribe him a benzodiazepine. He states that his current medications haven't been working well for him, though he has continued to take them. Last seen by outpatient psychiatry on 10/14.     Patient reports that his anxiety is an internal feeling of constant dread. He reports associated shortness of breath, muscle tension in his shoulder, an impending sense of doom, and feeling pins and needles. Patient reports that he also has panic attacks with last one about a week ago. 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 States that he has felt more irritable, has had difficulty sleeping/fatigue (likely related to nightly polyuria 2/2 high blood sugars), and difficulties concentrating. Denies SI, HI, and AVH.     ROS:   Pertinent positives and negative are detailed below.  All other systems reviewed are negative: polyuria, polydipsia, shortness of breath, fatigue    Collateral information:   Reviewed the following sources, including relevant findings.  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  Suicide attempts / Non-suicidal self-injury: He has been admitted for overdose in 09/2016 (alcohol and tramadol) and 07/2013 (opiates and benzodiazepines (Xanax)).   Medication trials/compliance: Subutex, Seroquel (started to give SE--feeling disoriented), Gabapentin, Prozac, Zoloft (too numb), Lithium (was horrible), Lexapro (too numb in 2006), trazodone (made me feel funny), Celexa (the worse), Xanax (worked).  Current psychiatrist: Dr. Arlean Hopping (Currently seeing NP Smolko at [facility] while she is on leave)  Current therapist: Pt denies, he states he tried in past and not helpful  Endorses family history of mother with bipolar disorder and paternal uncle with alcohol use disorder    Medical History:  Past Medical History:   Diagnosis Date   ??? Alcohol abuse    ??? 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 Bipolar disorder (CMS-HCC)    ??? Chronic pancreatitis (CMS-HCC)    ??? Chronic sinusitis    ??? 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  Cystic fibrosis (CMS-HCC)    ??? Diabetes mellitus (CMS-HCC)     dx in-house 2015   ??? Disease of thyroid gland    ??? Hypertension    ??? Kidney stones    ??? Portal hypertension (CMS-HCC)        Surgical History:  Past Surgical History:   Procedure Laterality Date   ??? CHOLECYSTECTOMY  2008   ??? sinus surgery     ??? TONSILLECTOMY         Medications:     Current Facility-Administered Medications:   ???  acetaminophen (TYLENOL) tablet 500 mg, 500 mg, Oral, Q6H PRN, Mackey Birchwood, MD, 500 mg at 04/06/18 2134  ???  albuterol 2.5 mg /3 mL (0.083 %) nebulizer solution 2.5 mg, 2.5 mg, Nebulization, 4x Daily (RT), Mackey Birchwood, MD, 2.5 mg at 04/07/18 0804  ???  busPIRone (BUSPAR) tablet 15 mg, 15 mg, Oral, TID, Mackey Birchwood, MD, 15 mg at 04/07/18 0826  ???  ceftaroline (TEFLARO) 600 mg in sodium chloride (NS) 0.9 % 50 mL IVPB, 600 mg, Intravenous, Q12H, Mackey Birchwood, MD, Stopped at 04/07/18 0400  ???  cholecalciferol (vitamin D3) tablet 7,500 Units, 7,500 Units, Oral, Daily, Mackey Birchwood, MD, 7,500 Units at 04/07/18 0823  ???  ciprofloxacin HCl (CIPRO) tablet 750 mg, 750 mg, Oral, Q12H SCH, Mackey Birchwood, MD, 750 mg at 04/07/18 1610  ???  dextrose (D10W) 10% bolus 125 mL, 12.5 g, Intravenous, Q30 Min PRN, Chinelo C Okigbo, MD  ???  dextrose (D10W) 10% bolus 250 mL, 250 mL, Intravenous, Once PRN, Raynald Blend, MD  ???  dornase alfa (PULMOZYME) 1 mg/mL nebulizer solution 2.5 mg, 2.5 mg, Inhalation, Daily (RT), Mackey Birchwood, MD, 2.5 mg at 04/07/18 0805  ???  heparin (porcine) injection 5,000 Units, 5,000 Units, Subcutaneous, Q8H SCH, Mackey Birchwood, MD, 5,000 Units at 04/07/18 0803  ???  hydrocortisone 1 % cream, , Topical, Daily PRN, Mackey Birchwood, MD  ???  insulin glargine (LANTUS) injection 30 Units, 30 Units, Subcutaneous, Nightly, Lovie Chol, MD, 30 Units at 04/06/18 2009  ???  insulin lispro (HumaLOG) injection 0-10 Units, 0-10 Units, Subcutaneous, ACHS, Lovie Chol, MD  ???  insulin lispro (HumaLOG) injection 14 Units, 14 Units, Subcutaneous, TID AC, Lovie Chol, MD, 14 Units at 04/07/18 0803  ???  ketoconazole (NIZORAL) 2 % cream 1 application, 1 application, Topical, Daily, Mackey Birchwood, MD, 1 application at 04/07/18 0826  ???  lactulose (CHRONULAC) oral solution (30 mL cup), 20 g, Oral, BID, Mackey Birchwood, MD, 20 g at 04/06/18 0833  ???  lidocaine (LIDODERM) 5 % patch 2 patch, 2 patch, Transdermal, Q24H, Mackey Birchwood, MD, Stopped at 04/07/18 0941  ???  lipase-protease-amylase (ZENPEP) 20,000-63,000- 84,000 unit capsule, delayed release 240,000 units of lipase, 12 capsule, Oral, 3xd Meals, Mackey Birchwood, MD, 240,000 units of lipase at 04/07/18 0804  ???  magnesium oxide (MAG-OX) tablet 400 mg, 400 mg, Oral, BID, Mackey Birchwood, MD, 400 mg at 04/07/18 0823  ???  melatonin tablet 3 mg, 3 mg, Oral, QPM, Mackey Birchwood, MD, 3 mg at 04/06/18 1807  ???  meropenem (MERREM) 2 g in sodium chloride (NS) 0.9 % 100 mL IVPB, 2 g, Intravenous, Q8H, Amit Ringel, MD, Last Rate: 150 mL/hr at 04/07/18 0430, 2 g at 04/07/18 0430  ???  mirtazapine (REMERON) tablet 45 mg, 45 mg, Oral, Nightly, Mackey Birchwood, MD, 45 mg at 04/06/18 2133  ???  MVW Complete (pediatric multivit 61-D3-vit K) 1,500-800 unit-mcg 2 capsule, 2 capsule, Oral, Daily, Amit Ringel,  MD, 2 capsule at 04/07/18 0823  ???  OLANZapine (ZYPREXA) tablet 15 mg, 15 mg, Oral, Nightly, Mackey Birchwood, MD, 15 mg at 04/06/18 2134  ???  oxyCODONE (ROXICODONE) 5 MG immediate release tablet, , , ,   ???  oxyCODONE (ROXICODONE) immediate release tablet 5 mg, 5 mg, Oral, Q8H PRN, Mackey Birchwood, MD, 5 mg at 04/07/18 0546  ???  pantoprazole (PROTONIX) EC tablet 40 mg, 40 mg, Oral, BID with meals, Mackey Birchwood, MD, 40 mg at 04/07/18 0804  ???  phytonadione (vitamin K1) (MEPHYTON) tablet 5 mg, 5 mg, Oral, Q W and Sa, Amit Ringel, MD, 5 mg at 04/06/18 1610  ???  polyethylene glycol (MIRALAX) packet 17 g, 17 g, Oral, BID, Mackey Birchwood, MD, 17 g at 04/06/18 9604  ???  polyethylene glycol (MIRALAX) packet 17 g, 17 g, Oral, TID PRN, Mackey Birchwood, MD  ???  sodium chloride 7% nebulizer solution 4 mL, 4 mL, Nebulization, 4x Daily (RT), Mackey Birchwood, MD, 4 mL at 04/07/18 0806  ???  traZODone (DESYREL) tablet 50 mg, 50 mg, Oral, Nightly, Mackey Birchwood, MD, 50 mg at 04/06/18 2134    Current Outpatient Medications:   ???  albuterol 2.5 mg /3 mL (0.083 %) nebulizer solution, Inhale 2.5 mg by nebulization 2 (two) times a day., Disp: , Rfl:   ???  cholecalciferol, vitamin D3, 5,000 unit tablet, Take 7,500 Units by mouth daily., Disp: , Rfl:   ???  insulin glargine (LANTUS) 100 unit/mL injection, Inject 24 Units under the skin nightly., Disp: , Rfl:   ???  lidocaine (LIDODERM) 5 % patch, Place 1 patch on the skin daily. Apply to affected area for 12 hours only each day (then remove patch), Disp: , Rfl:   ???  busPIRone (BUSPAR) 15 MG tablet, Take 1 tablet (15 mg total) by mouth Three (3) times a day., Disp: 90 tablet, Rfl: 1  ???  dornase alfa (PULMOZYME) 1 mg/mL nebulizer solution, Inhale the contents of 1 vial (2.5 mg) via nebulizer once daily., Disp: 225 mL, Rfl: 3  ???  gabapentin (NEURONTIN) 300 MG capsule, Take 4 capsules (1200mg ) in the morning and 3 capsule (900mg ) at 12p and bedtime., Disp: 300 capsule, Rfl: 1  ???  ibuprofen (ADVIL,MOTRIN) 200 MG tablet, Take 800 mg by mouth 4 (four) times a day as needed. , Disp: , Rfl:   ???  lipase-protease-amylase (ZENPEP) 20,000-63,000- 84,000 unit CpDR capsule, delayed release, Take 12 capsules by mouth Three (3) times a day with a meal. 12 caps with meals and 3 caps with snacks, Disp: , Rfl:   ???  magnesium oxide (MAG-OX) 400 mg (241.3 mg magnesium) tablet, Take 2 tablets (800 mg total) by mouth daily., Disp: 60 tablet, Rfl: 11  ???  melatonin 3 mg Tab, Take 2 tablets (6 mg total) by mouth every evening., Disp: 30 tablet, Rfl: 0  ???  mirtazapine (REMERON) 45 MG tablet, Take 1 tablet (45 mg total) by mouth nightly., Disp: 30 tablet, Rfl: 1  ???  MVW Complete, pediatric multivit 61-D3-vit K, 1,500-800 unit-mcg cap, Take 2 capsules by mouth daily., Disp: 60 each, Rfl: 1  ???  nebulizers (LC PLUS) Misc, Pari LC Plus Reusable Cup, Disp: 2 each, Rfl: 11  ???  OLANZapine (ZYPREXA) 15 MG tablet, Take 1 tablet (15 mg total) by mouth nightly., Disp: 30 tablet, Rfl: 1  ???  pantoprazole (PROTONIX) 40 MG tablet, Take 1 tablet (40 mg total) by mouth Two (2) times a day., Disp: 180 tablet, Rfl: 2  ???  phytonadione, vitamin K1, (MEPHYTON) 5 mg tablet, Take 1 tablet (5 mg total) by mouth Every Wednesday and Saturday., Disp: 8 tablet, Rfl: 11  ???  sodium chloride 7% 7 % Nebu, Inhale 4 mL by nebulization two (2) times a day., Disp: 720 mL, Rfl: 3  ???  traZODone (DESYREL) 50 MG tablet, Take 1 tablet (50 mg total) by mouth nightly., Disp: 30 tablet, Rfl: 1    Allergies:  No Known Allergies    Social History:   Per chart review and interview:  Living situation: The patient lives with [place] with two roommates. States that he gets along well with them, one of them is also in recovery and is good support for him. Reports in 2014 November fiance broke up with him and he moved from [place] back to [place] area.   Support: Mom lives in [place] area, sister is a good support  Guardian/Payee: None  Relationship Status: Single  Children: None, previously stated he is infertile due to CF  Education: Bachelor's degree from [employer]  Income/Employment/Disability: Was working at [employer] in [place] for 7 years. He states he was fired for getting drunk and angrily texting boss. 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  Abuse/Neglect/Trauma:Reports emotional abuse from mother growing up  Current/Prior Legal: Pt denies current, reports he was arrested in [place] for being passed out drunk   Access to Firearms: Pt denies any access   ??  Tobacco use: Previously 1 PPD 2015-2019, quit several months ago  Alcohol use: He states that the longest he was sober was 9 months in rehab. Relapsed before he came in the hospital (July 2019), sober between Jul 30 2017-July 2019. Patient reports sobriety again lately and attending AA meetings. Denies history of complicated withdrawal. Admits to withdrawal symptoms of tremors, anxiety, headache, and tactile hallucinations at times. He started drinking alcohol at age 88 -h/o blackouts. Admitted to the hospital because of withdrawal a number of times.   Drug use:  MJ: Reports starting MJ smoking in college, stopped 1 year ago because hurting lungs too much. Reports it helped with anxiety and he enjoyed it.   Opiates: Pt denies  BZD: Pt denies abuse, reports being prescribed Xanax for years but OD'd on it  Meth: Pt denies  Mushrooms: Tried once, denies current use  Ectasy: Pt denies  LSD: Pt denies  Cocaine: Cocaine problem in 2015 which led him to rehab  Heroin: Tried once, Pt denies recent use    Objective:   Vital signs:   Temp:  [36.4 ??C-37.4 ??C] 36.4 ??C  Heart Rate:  [73-83] 76  SpO2 Pulse:  [75-90] 78  Resp:  [16-18] 17  BP: (101-135)/(58-96) 113/64  MAP (mmHg):  [90-107] 107  SpO2:  [90 %-100 %] 98 %    Physical Exam:  Gen: No acute distress.  Pulm: Normal work of breathing. Nasal cannula in place.  Neuro/MSK: Normal bulk/tone. Gait/station deferred given acute CF exacerbation.  Skin: pale.    Mental Status Exam:  Appearance:  appears stated age, lying in bed, wearing nasal cannula and ill-appearing   Attitude:   irritable Behavior/Psychomotor:  limited eye contact   Speech/Language:   normal rate, volume, tone, fluency and language intact, well formed   Mood:  ???crazy anxious???   Affect:  constricted and mood incongruent    Thought process:  logical, linear, clear, coherent, goal directed   Thought content:    denies thoughts of self-harm.  Denies SI, plans, or intent. Denies HI.  No grandiose, self-referential, persecutory, or paranoid delusions noted.   Perceptual disturbances:   denies auditory and visual hallucinations and behavior not concerning for response to internal stimuli   Attention:  able to fully attend without fluctuations in consciousness   Concentration:  Able to fully concentrate and attend   Orientation:  grossly oriented.   Memory:  not formally tested, but grossly intact   Fund of knowledge:   not formally assessed   Insight:    Fair   Judgment:   Impaired   Impulse Control:  Fair       Data Reviewed:  I reviewed labs from the last 24 hours.  I obtained history from the collateral sources noted above in the history.    Additional Psychometric Testing:  Not applicable.

## 2018-04-07 NOTE — Unmapped (Signed)
Patient rounds complete. Pt sleeping. Airway intact, breathing even and unlabored and color appropriate for ethnicity. Pt in NAD. The following needs have been addressed: stretcher low and locked, call light within reach, pt belongings addressed, pain, and toileting. Will continue to monitor and wait for further orders from [facility].

## 2018-04-07 NOTE — Unmapped (Signed)
Patient rounds completed. The following patient needs were addressed:  Plan of Care, Call Bell in [place], Bed Position Low with wheels locked. Pt resting in bed, NAD, appears to be asleep. No complaints or needs at the moment.

## 2018-04-07 NOTE — Unmapped (Signed)
Endocrinology Consult Follow Up Note    Requesting Attending Physician :  No att. providers found  Service Requesting Consult : Emergency Medicine  Primary Care Provider: Dellis Filbert, MD  Outpatient Endocrinologist: N/A    Assessment/Recommendations:    Walter [name] is a 37 y.o. 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 I have been asked to see in consultation at the request of [name] MD for evaluation of CFRD.    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 Complicated by infection and noncompliance. Presented in DKA and now off IV insulin infusion. Glycemic control is now improving. Recommendations are as follows:  -Continue Lantus 30 units qhs  -Continue Lispro 14 units TIDAC (hold if NPO; give half if <75% of meals eaten)  -Reduce to standard correction scale 2:50>150    The patient was discussed with Dr. Tiburcio Pea.    We will continue to follow patient as needed. Please call/page [phone] with questions or concerns.     Farrel Conners, MD  [facility] Endocrinology Fellow      History of Present Illness:     Reason for Consult: Hyperglycemia    Walter [name] is a 37 y.o. male admitted for Cystic fibrosis with pulmonary exacerbation (CMS-HCC). I have been asked to evaluate [facility] for CFRD. He has been started on IV antibiotics. He is not on any steroids for this exacerbation.     Interval history: Denies any symptoms. Glycemic control is improving.    History from initial encounter:  Patient is well known to the consult service from prior admissions. He was last seen by our team on 02/03/18 at which point he was receiving Lantus 30 units qhs, Lispro 14 units with meals and on a standard correction scale. His most recent A1c is 9.6% (01/12/18). Patient reports DM medication noncompliance as he was focusing on trying to control his CF. He acknowledged that he takes up to 18 units of lispro with meals and 30 units of lantus at home but is not consistent. He presented with BG >1000 and an elevated anion gap. He received 10 units of regular insulin overnight and 15 units of NOH this morning. He was briefly placed on IV insulin gtt and his anion gap closed. He reports good appetite. Thus, we will restart a prior known in-hospital regimen that worked.    Past Medical History:    Medical History:    Past Medical History:   Diagnosis Date   ??? Alcohol abuse    ??? 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 Bipolar disorder (CMS-HCC)    ??? Chronic pancreatitis (CMS-HCC)    ??? Chronic sinusitis    ??? 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 Cystic fibrosis (CMS-HCC)    ??? Diabetes mellitus (CMS-HCC)     dx in-house 2015   ??? Disease of thyroid gland    ??? Hypertension    ??? Kidney stones    ??? Portal hypertension (CMS-HCC)        Surgical History:    Past Surgical History:   Procedure Laterality Date   ??? CHOLECYSTECTOMY  2008   ??? sinus surgery     ??? TONSILLECTOMY         Allergies:    Patient has no known allergies.    All Medications:     Current Facility-Administered Medications   Medication Dose Route Frequency Provider Last Rate Last Dose   ??? acetaminophen (TYLENOL)  tablet 500 mg  500 mg Oral Q6H PRN Mackey Birchwood, MD   500 mg at 04/06/18 2134   ??? albuterol 2.5 mg /3 mL (0.083 %) nebulizer solution 2.5 mg  2.5 mg Nebulization 4x Daily (RT) Mackey Birchwood, MD   2.5 mg at 04/06/18 2132   ??? busPIRone (BUSPAR) tablet 15 mg  15 mg Oral TID Mackey Birchwood, MD   15 mg at 04/06/18 2150   ??? ceftaroline (TEFLARO) 600 mg in sodium chloride (NS) 0.9 % 50 mL IVPB  600 mg Intravenous Q12H Mackey Birchwood, MD   Stopped at 04/07/18 0400   ??? cholecalciferol (vitamin D3) tablet 7,500 Units  7,500 Units Oral Daily Mackey Birchwood, MD   7,500 Units at 04/06/18 0921   ??? ciprofloxacin HCl (CIPRO) tablet 750 mg  750 mg Oral Q12H [facility] Mackey Birchwood, MD   750 mg at 04/06/18 1516   ??? dextrose (D10W) 10% bolus 125 mL  12.5 g Intravenous Q30 Min PRN Rhoderick Farrel C Corita Allinson, MD       ??? dextrose (D10W) 10% bolus 250 mL 250 mL Intravenous Once PRN Raynald Blend, MD       ??? dornase alfa (PULMOZYME) 1 mg/mL nebulizer solution 2.5 mg  2.5 mg Inhalation Daily (RT) Mackey Birchwood, MD       ??? heparin (porcine) injection 5,000 Units  5,000 Units Subcutaneous [facility] Mackey Birchwood, MD   5,000 Units at 04/06/18 2135   ??? hydrocortisone 1 % cream   Topical Daily PRN Mackey Birchwood, MD       ??? insulin glargine (LANTUS) injection 30 Units  30 Units Subcutaneous Nightly Lovie Chol, MD   30 Units at 04/06/18 2009   ??? insulin lispro (HumaLOG) injection 0-10 Units  0-10 Units Subcutaneous ACHS Lovie Chol, MD       ??? insulin lispro (HumaLOG) injection 14 Units  14 Units Subcutaneous TID AC Drema Eddington C Yesha Muchow, MD       ??? ketoconazole (NIZORAL) 2 % cream 1 application  1 application Topical Daily Mackey Birchwood, MD   1 application at 04/06/18 0834   ??? lactulose (CHRONULAC) oral solution (30 mL cup)  20 g Oral BID Mackey Birchwood, MD   20 g at 04/06/18 0833   ??? lidocaine (LIDODERM) 5 % patch 2 patch  2 patch Transdermal Q24H Mackey Birchwood, MD   1 patch at 04/06/18 2135   ??? lipase-protease-amylase (ZENPEP) 20,000-63,000- 84,000 unit capsule, delayed release 240,000 units of lipase  12 capsule Oral 3xd Meals Mackey Birchwood, MD   Stopped at 04/06/18 0921   ??? magnesium oxide (MAG-OX) tablet 400 mg  400 mg Oral BID Mackey Birchwood, MD   400 mg at 04/06/18 2134   ??? melatonin tablet 3 mg  3 mg Oral QPM Mackey Birchwood, MD   3 mg at 04/06/18 1807   ??? meropenem (MERREM) 2 g in sodium chloride (NS) 0.9 % 100 mL IVPB  2 g Intravenous Q8H Amit Ringel, MD 150 mL/hr at 04/07/18 0430 2 g at 04/07/18 0430   ??? mirtazapine (REMERON) tablet 45 mg  45 mg Oral Nightly Mackey Birchwood, MD   45 mg at 04/06/18 2133   ??? MVW Complete (pediatric multivit 61-D3-vit K) 1,500-800 unit-mcg 2 capsule  2 capsule Oral Daily Mackey Birchwood, MD   2 capsule at 04/06/18 0833   ??? OLANZapine (ZYPREXA) tablet 15 mg  15 mg Oral Nightly Mackey Birchwood, MD   15 mg at 04/06/18 2134   ??? oxyCODONE (ROXICODONE) 5  MG immediate release tablet            ??? oxyCODONE (ROXICODONE) immediate release tablet 5 mg  5 mg Oral Q8H PRN Mackey Birchwood, MD   5 mg at 04/07/18 0546   ??? pantoprazole (PROTONIX) EC tablet 40 mg  40 mg Oral BID with meals Mackey Birchwood, MD   40 mg at 04/06/18 1807   ??? phytonadione (vitamin K1) (MEPHYTON) tablet 5 mg  5 mg Oral Q W and Sa Mackey Birchwood, MD   5 mg at 04/06/18 1610   ??? polyethylene glycol (MIRALAX) packet 17 g  17 g Oral BID Mackey Birchwood, MD   17 g at 04/06/18 9604   ??? polyethylene glycol (MIRALAX) packet 17 g  17 g Oral TID PRN Mackey Birchwood, MD       ??? sodium chloride 7% nebulizer solution 4 mL  4 mL Nebulization 4x Daily (RT) Mackey Birchwood, MD   4 mL at 04/06/18 1612   ??? traZODone (DESYREL) tablet 50 mg  50 mg Oral Nightly Mackey Birchwood, MD   50 mg at 04/06/18 2134     Current Outpatient Medications   Medication Sig Dispense Refill   ??? albuterol 2.5 mg /3 mL (0.083 %) nebulizer solution Inhale 2.5 mg by nebulization 2 (two) times a day.     ??? cholecalciferol, vitamin D3, 5,000 unit tablet Take 7,500 Units by mouth daily.     ??? insulin glargine (LANTUS) 100 unit/mL injection Inject 24 Units under the skin nightly.     ??? lidocaine (LIDODERM) 5 % patch Place 1 patch on the skin daily. Apply to affected area for 12 hours only each day (then remove patch)     ??? busPIRone (BUSPAR) 15 MG tablet Take 1 tablet (15 mg total) by mouth Three (3) times a day. 90 tablet 1   ??? dornase alfa (PULMOZYME) 1 mg/mL nebulizer solution Inhale the contents of 1 vial (2.5 mg) via nebulizer once daily. 225 mL 3   ??? gabapentin (NEURONTIN) 300 MG capsule Take 4 capsules (1200mg ) in the morning and 3 capsule (900mg ) at 12p and bedtime. 300 capsule 1   ??? ibuprofen (ADVIL,MOTRIN) 200 MG tablet Take 800 mg by mouth 4 (four) times a day as needed.      ??? lipase-protease-amylase (ZENPEP) 20,000-63,000- 84,000 unit CpDR capsule, delayed release Take 12 capsules by mouth Three (3) times a day with a meal. 12 caps with meals and 3 caps with snacks     ??? magnesium oxide (MAG-OX) 400 mg (241.3 mg magnesium) tablet Take 2 tablets (800 mg total) by mouth daily. 60 tablet 11   ??? melatonin 3 mg Tab Take 2 tablets (6 mg total) by mouth every evening. 30 tablet 0   ??? mirtazapine (REMERON) 45 MG tablet Take 1 tablet (45 mg total) by mouth nightly. 30 tablet 1   ??? MVW Complete, pediatric multivit 61-D3-vit K, 1,500-800 unit-mcg cap Take 2 capsules by mouth daily. 60 each 1   ??? nebulizers (LC PLUS) Misc Pari LC Plus Reusable Cup 2 each 11   ??? OLANZapine (ZYPREXA) 15 MG tablet Take 1 tablet (15 mg total) by mouth nightly. 30 tablet 1   ??? pantoprazole (PROTONIX) 40 MG tablet Take 1 tablet (40 mg total) by mouth Two (2) times a day. 180 tablet 2   ??? phytonadione, vitamin K1, (MEPHYTON) 5 mg tablet Take 1 tablet (5 mg total) by mouth Every Wednesday and Saturday. 8 tablet 11   ??? sodium chloride 7%  7 % Nebu Inhale 4 mL by nebulization two (2) times a day. 720 mL 3   ??? traZODone (DESYREL) 50 MG tablet Take 1 tablet (50 mg total) by mouth nightly. 30 tablet 1       Social History:     Social History     Socioeconomic History   ??? Marital status: Single     Spouse name: None   ??? Number of children: 0   ??? Years of education: 12+   ??? Highest education level: Bachelor's degree (e.g., BA, AB, BS)   Occupational History   ??? Occupation: Restaurant manager, fast food     Comment: works at [employer]   ??? Financial resource strain: Very hard   ??? Food insecurity:     Worry: Often true     Inability: Often true   ??? Transportation needs:     Medical: No     Non-medical: No   Tobacco Use   ??? Smoking status: Former Smoker     Types: Cigarettes   ??? Smokeless tobacco: Never Used   ??? Tobacco comment: quit several months ago   Substance and Sexual Activity   ??? Alcohol use: Not Currently     Alcohol/week: 0.0 standard drinks   ??? Drug use: Not Currently     Comment: hx of alcohol and prescription pain medication abuse   ??? Sexual activity: Not Currently   Lifestyle   ??? Physical activity:     Days per week: 0 days Minutes per session: 0 min   ??? Stress: Very much   Relationships   ??? Social connections:     Talks on phone: Twice a week     Gets together: Once a week     Attends religious service: Never     Active member of club or organization: No     Attends meetings of clubs or organizations: Never     Relationship status: Never married   Other Topics Concern   ??? 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 Hillard Danker with [facility] in [place]    Current therapist: Nehemiah Settle with CF team        SUBSTANCE ABUSE HX:     # MJ     -Current use: once in awhile    # Alcohol     -Current use: Was sober from 2016 until two weeks ago. Admits to drinking enough to get drunk and admits to passing out. Denies history of complicated withdrawal. Admits to withdrawal symptoms of tremors, anxiety, headache, and tactile hallucinations at times.     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    Code Status:    Full Code    Review of Systems:    A 12 system review of systems was negative except as noted in HPI.    Objective: :    BP 126/96  - Pulse 83  - Temp 36.6 ??C (Axillary)  - Resp 16  - SpO2 98%     Physical Exam:  Constitutional: alert, no acute distress  ENT: eyes closed, mucous membranes moist  CVS: RRR no MRG, S1S2 distinct  Resp: clear to auscultation, symmetric air entry  GI: soft, nontender, nondistended  MSK: no obvious joint deformity, feet-no obvious cuts, scabs, bruises, sensation intact  Neurological: awake, oriented to person, place, and time, normal speech.  Lymph: peripheral pulses normal, no pedal edema  Skin: normal coloration and turgor, no rashes  Psych: normal affect      Test Results    Data Review  Lab Results   Component Value Date    POCGLU 171 04/07/2018    POCGLU 411 (HH) 04/06/2018    POCGLU 279 (H) 04/06/2018    POCGLU 342 (H) 04/06/2018    POCGLU 363 (H) 04/06/2018    POCGLU 409 (HH) 04/06/2018    POCGLU 431 (HH) 04/06/2018       Lab Results   Component Value Date    A1C 9.6 (H) 01/12/2018    A1C 11.9 (H) 11/17/2017    A1C 10.0 (H) 08/30/2017     Lab Results   Component Value Date    GFR >= 60 10/31/2010    CREATININE 1.53 (H) 04/06/2018     Lab Results   Component Value Date    CHOL 116 01/29/2018     Lab Results   Component Value Date    HDL 61 (H) 01/29/2018     Lab Results   Component Value Date    LDL 35 (L) 01/29/2018     Lab Results   Component Value Date    TRIG 102 01/29/2018

## 2018-04-07 NOTE — Unmapped (Signed)
Endocrinology Consult Note    Requesting Attending Physician :  No att. providers found  Service Requesting Consult : Emergency Medicine  Primary Care Provider: Dellis Filbert, MD  Outpatient Endocrinologist: N/A    Assessment/Recommendations:    Walter [name] is a 37 y.o. 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 I have been asked to see in consultation at the request of [name] MD for evaluation of CFRD.    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 Complicated by infection and noncompliance. Presented in DKA and now off IV insulin infusion. Still hyperglycemic with BG >300s. Recommendations are as follows:  -Start Lantus 30 units qhs  -Start Lispro 14 units TIDAC (hold if NPO; give half if <75% of meals eaten)  -Start resistant correction scale 4:50>150    The patient was seen and discussed with Dr. Tiburcio Pea.    Thank you for the consult. We will continue to follow patient as needed. Please call/page [phone] with questions or concerns.     Farrel Conners, MD  [facility] Endocrinology Fellow      History of Present Illness:     Reason for Consult: Hyperglycemia    Walter [name] is a 37 y.o. male admitted for Cystic fibrosis with pulmonary exacerbation (CMS-HCC). I have been asked to evaluate [facility] for CFRD. He has been started on IV antibiotics. He is not on any steroids for this exacerbation.     Patient is well known to the consult service from prior admissions. He was last seen by our team on 02/03/18 at which point he was receiving Lantus 30 units qhs, Lispro 14 units with meals and on a standard correction scale. His most recent A1c is 9.6% (01/12/18). Patient reports DM medication noncompliance as he was focusing on trying to control his CF. He acknowledged that he takes up to 18 units of lispro with meals and 30 units of lantus at home but is not consistent. He presented with BG >1000 and an elevated anion gap. He received 10 units of regular insulin overnight and 15 units of NOH this morning. He was briefly placed on IV insulin gtt and his anion gap closed. He reports good appetite. Thus, we will restart a prior known in-hospital regimen that worked.    Past Medical History:    Medical History:    Past Medical History:   Diagnosis Date   ??? Alcohol abuse    ??? 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 Bipolar disorder (CMS-HCC)    ??? Chronic pancreatitis (CMS-HCC)    ??? Chronic sinusitis    ??? 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 Cystic fibrosis (CMS-HCC)    ??? Diabetes mellitus (CMS-HCC)     dx in-house 2015   ??? Disease of thyroid gland    ??? Hypertension    ??? Kidney stones    ??? Portal hypertension (CMS-HCC)        Surgical History:    Past Surgical History:   Procedure Laterality Date   ??? CHOLECYSTECTOMY  2008   ??? sinus surgery     ??? TONSILLECTOMY         Allergies:    Patient has no known allergies.    All Medications:     Current Facility-Administered Medications   Medication Dose Route Frequency Provider Last Rate Last Dose   ??? acetaminophen (TYLENOL) tablet 500 mg  500 mg Oral Q6H PRN Mackey Birchwood, MD       ???  albuterol 2.5 mg /3 mL (0.083 %) nebulizer solution 2.5 mg  2.5 mg Nebulization 4x Daily (RT) Mackey Birchwood, MD   2.5 mg at 04/06/18 1516   ??? busPIRone (BUSPAR) tablet 15 mg  15 mg Oral TID Mackey Birchwood, MD   15 mg at 04/06/18 1611   ??? ceftaroline (TEFLARO) 600 mg in sodium chloride (NS) 0.9 % 50 mL IVPB  600 mg Intravenous Q12H Mackey Birchwood, MD   Stopped at 04/06/18 1818   ??? cholecalciferol (vitamin D3) tablet 7,500 Units  7,500 Units Oral Daily Mackey Birchwood, MD   7,500 Units at 04/06/18 0921   ??? ciprofloxacin HCl (CIPRO) tablet 750 mg  750 mg Oral Q12H [facility] Mackey Birchwood, MD   750 mg at 04/06/18 1516   ??? dextrose (D10W) 10% bolus 125 mL  12.5 g Intravenous Q30 Min PRN Aparna Vanderweele C Tamyrah Burbage, MD       ??? dextrose (D10W) 10% bolus 250 mL  250 mL Intravenous Once PRN Raynald Blend, MD       ??? dornase alfa (PULMOZYME) 1 mg/mL nebulizer solution 2.5 mg  2.5 mg Inhalation Daily (RT) Mackey Birchwood, MD       ??? heparin (porcine) injection 5,000 Units  5,000 Units Subcutaneous Q8H [facility] Mackey Birchwood, MD   5,000 Units at 04/06/18 1435   ??? hydrocortisone 1 % cream   Topical Daily PRN Mackey Birchwood, MD       ??? insulin glargine (LANTUS) injection 30 Units  30 Units Subcutaneous Nightly Sheva Mcdougle Philip Aspen, MD       ??? insulin lispro (HumaLOG) injection 1-20 Units  1-20 Units Subcutaneous ACHS Lovie Chol, MD       ??? insulin lispro (HumaLOG) injection 18 Units  18 Units Subcutaneous TID AC Yahaira Bruski C Bedelia Pong, MD       ??? ketoconazole (NIZORAL) 2 % cream 1 application  1 application Topical Daily Mackey Birchwood, MD   1 application at 04/06/18 0834   ??? lactulose (CHRONULAC) oral solution (30 mL cup)  20 g Oral BID Mackey Birchwood, MD   20 g at 04/06/18 0833   ??? lidocaine (LIDODERM) 5 % patch 2 patch  2 patch Transdermal Q24H Mackey Birchwood, MD       ??? lipase-protease-amylase (ZENPEP) 20,000-63,000- 84,000 unit capsule, delayed release 240,000 units of lipase  12 capsule Oral 3xd Meals Mackey Birchwood, MD   Stopped at 04/06/18 0921   ??? magnesium oxide (MAG-OX) tablet 400 mg  400 mg Oral BID Mackey Birchwood, MD   400 mg at 04/06/18 0833   ??? melatonin tablet 3 mg  3 mg Oral QPM Mackey Birchwood, MD   3 mg at 04/06/18 1807   ??? meropenem (MERREM) 2 g in sodium chloride (NS) 0.9 % 100 mL IVPB  2 g Intravenous Q8H Mackey Birchwood, MD   Stopped at 04/06/18 1448   ??? mirtazapine (REMERON) tablet 45 mg  45 mg Oral Nightly Mackey Birchwood, MD       ??? MVW Complete (pediatric multivit 61-D3-vit K) 1,500-800 unit-mcg 2 capsule  2 capsule Oral Daily Mackey Birchwood, MD   2 capsule at 04/06/18 0833   ??? OLANZapine (ZYPREXA) tablet 15 mg  15 mg Oral Nightly Amit Ringel, MD       ??? oxyCODONE (ROXICODONE) 5 MG immediate release tablet            ??? oxyCODONE (ROXICODONE) immediate release tablet 5 mg  5 mg Oral Q8H PRN Mackey Birchwood, MD   5 mg at  04/06/18 1516   ??? pantoprazole (PROTONIX) EC tablet 40 mg  40 mg Oral BID with meals Mackey Birchwood, MD 40 mg at 04/06/18 1807   ??? phytonadione (vitamin K1) (MEPHYTON) tablet 5 mg  5 mg Oral Q W and Sa Mackey Birchwood, MD   5 mg at 04/06/18 1610   ??? polyethylene glycol (MIRALAX) packet 17 g  17 g Oral BID Mackey Birchwood, MD   17 g at 04/06/18 9604   ??? polyethylene glycol (MIRALAX) packet 17 g  17 g Oral TID PRN Mackey Birchwood, MD       ??? sodium chloride 7% nebulizer solution 4 mL  4 mL Nebulization 4x Daily (RT) Mackey Birchwood, MD   4 mL at 04/06/18 1612   ??? traZODone (DESYREL) tablet 50 mg  50 mg Oral Nightly Mackey Birchwood, MD         Current Outpatient Medications   Medication Sig Dispense Refill   ??? albuterol 2.5 mg /3 mL (0.083 %) nebulizer solution Inhale 2.5 mg by nebulization 2 (two) times a day.     ??? cholecalciferol, vitamin D3, 5,000 unit tablet Take 7,500 Units by mouth daily.     ??? insulin glargine (LANTUS) 100 unit/mL injection Inject 24 Units under the skin nightly.     ??? lidocaine (LIDODERM) 5 % patch Place 1 patch on the skin daily. Apply to affected area for 12 hours only each day (then remove patch)     ??? busPIRone (BUSPAR) 15 MG tablet Take 1 tablet (15 mg total) by mouth Three (3) times a day. 90 tablet 1   ??? dornase alfa (PULMOZYME) 1 mg/mL nebulizer solution Inhale the contents of 1 vial (2.5 mg) via nebulizer once daily. 225 mL 3   ??? gabapentin (NEURONTIN) 300 MG capsule Take 4 capsules (1200mg ) in the morning and 3 capsule (900mg ) at 12p and bedtime. 300 capsule 1   ??? ibuprofen (ADVIL,MOTRIN) 200 MG tablet Take 800 mg by mouth 4 (four) times a day as needed.      ??? lipase-protease-amylase (ZENPEP) 20,000-63,000- 84,000 unit CpDR capsule, delayed release Take 12 capsules by mouth Three (3) times a day with a meal. 12 caps with meals and 3 caps with snacks     ??? magnesium oxide (MAG-OX) 400 mg (241.3 mg magnesium) tablet Take 2 tablets (800 mg total) by mouth daily. 60 tablet 11   ??? melatonin 3 mg Tab Take 2 tablets (6 mg total) by mouth every evening. 30 tablet 0   ??? mirtazapine (REMERON) 45 MG tablet Take 1 tablet (45 mg total) by mouth nightly. 30 tablet 1   ??? MVW Complete, pediatric multivit 61-D3-vit K, 1,500-800 unit-mcg cap Take 2 capsules by mouth daily. 60 each 1   ??? nebulizers (LC PLUS) Misc Pari LC Plus Reusable Cup 2 each 11   ??? OLANZapine (ZYPREXA) 15 MG tablet Take 1 tablet (15 mg total) by mouth nightly. 30 tablet 1   ??? pantoprazole (PROTONIX) 40 MG tablet Take 1 tablet (40 mg total) by mouth Two (2) times a day. 180 tablet 2   ??? phytonadione, vitamin K1, (MEPHYTON) 5 mg tablet Take 1 tablet (5 mg total) by mouth Every Wednesday and Saturday. 8 tablet 11   ??? sodium chloride 7% 7 % Nebu Inhale 4 mL by nebulization two (2) times a day. 720 mL 3   ??? traZODone (DESYREL) 50 MG tablet Take 1 tablet (50 mg total) by mouth nightly. 30 tablet 1  Social History:     Social History     Socioeconomic History   ??? Marital status: Single     Spouse name: None   ??? Number of children: 0   ??? Years of education: 12+   ??? Highest education level: Bachelor's degree (e.g., BA, AB, BS)   Occupational History   ??? Occupation: Restaurant manager, fast food     Comment: works at [employer]   ??? Financial resource strain: Very hard   ??? Food insecurity:     Worry: Often true     Inability: Often true   ??? Transportation needs:     Medical: No     Non-medical: No   Tobacco Use   ??? Smoking status: Former Smoker     Types: Cigarettes   ??? Smokeless tobacco: Never Used   ??? Tobacco comment: quit several months ago   Substance and Sexual Activity   ??? Alcohol use: Not Currently     Alcohol/week: 0.0 standard drinks   ??? Drug use: Not Currently     Comment: hx of alcohol and prescription pain medication abuse   ??? Sexual activity: Not Currently   Lifestyle   ??? Physical activity:     Days per week: 0 days     Minutes per session: 0 min   ??? Stress: Very much   Relationships   ??? Social connections:     Talks on phone: Twice a week     Gets together: Once a week     Attends religious service: Never     Active member of club or organization: No Attends meetings of clubs or organizations: Never     Relationship status: Never married   Other Topics Concern   ??? 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 Hillard Danker with [facility] in [place]    Current therapist: Nehemiah Settle with CF team        SUBSTANCE ABUSE HX:     # MJ     -Current use: once in awhile    # Alcohol     -Current use: Was sober from 2016 until two weeks ago. Admits to drinking enough to get drunk and admits to passing out. Denies history of complicated withdrawal. Admits to withdrawal symptoms of tremors, anxiety, headache, and tactile hallucinations at times.     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    Code Status:    Full Code    Review of Systems:    A 12 system review of systems was negative except as noted in HPI.    Objective: :    BP 115/78  - Pulse 83  - Temp 36.6 ??C (Axillary)  - Resp 17  - SpO2 90%     Physical Exam:  Constitutional: sleepy but responsive to verbal stimuli, disheveled, no acute distress  ENT: eyes closed, mucous membranes moist  CVS: RRR no MRG, S1S2 distinct  Resp: clear to auscultation, symmetric air entry  GI: soft, nontender, nondistended  MSK: no obvious joint deformity, feet-no obvious cuts, scabs, bruises, sensation intact  Neurological: awake, oriented to person, place, and time, normal speech.  Lymph: peripheral pulses normal, no pedal edema  Skin: normal coloration and turgor, no rashes  Psych: normal affect      Test Results    Data Review  Lab Results   Component Value Date    POCGLU 342 (H) 04/06/2018    POCGLU 363 (H) 04/06/2018    POCGLU 409 (HH) 04/06/2018    POCGLU 431 (HH) 04/06/2018    POCGLU 430 (HH) 04/06/2018    POCGLU 529 (HH) 04/06/2018    POCGLU >600 (HH) 04/06/2018       Lab Results   Component Value Date    A1C 9.6 (H) 01/12/2018    A1C 11.9 (H) 11/17/2017    A1C 10.0 (H) 08/30/2017     Lab Results   Component Value Date    GFR >= 60 10/31/2010    CREATININE 1.53 (H) 04/06/2018     Lab Results   Component Value Date    CHOL 116 01/29/2018     Lab Results   Component Value Date    HDL 61 (H) 01/29/2018     Lab Results   Component Value Date    LDL 35 (L) 01/29/2018     Lab Results   Component Value Date    TRIG 102 01/29/2018     Time spent on counseling/coordination of care: 30 Minutes  Total time spent with patient: 30 Minutes

## 2018-04-07 NOTE — Unmapped (Signed)
Patient rounds completed. The following patient needs were addressed:  Plan of Care, Call Bell in [place], Bed Position Low with wheels locked. Pt resting in bed, NAD. No complaints or needs at the moment.

## 2018-04-07 NOTE — Unmapped (Signed)
Pt states he is unable to provide sputum sample at this time. Pt says he is unable to cough up anything.

## 2018-04-07 NOTE — Unmapped (Signed)
Meal tray ordered 

## 2018-04-07 NOTE — Unmapped (Addendum)
Walter [name] is a 37 y.o. 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    HHS, CF-related diabetes mellitus: Follows with endocrinology. A1c 9.6% (12/2017). Reports very poor adherence to insulin regimen, occasionally takes lantus but doesn't take any other insulin. On presentation, glucose 1,190 with elevated anion gap 16 and elevated beta-hydroxybutyrate. A1C 10/19 13.3%, recent insulin adjustments with better controlled glucoses.Continue  28 U Lantus QHS at home.    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 Lurena Nida. FEV1 35% (09/2017). Prior cultures with mucoid and smooth PsA, H flu, achromobacter. CXR on admission without focal consolidation.  Cultures this admission grew achromobacter and Mucoid + Smooth Pseudomonas aeruginosa. Fluronef was increased. Pt had CRP of 40 on admission, downtrending to 12 prior to discharge.   PFTs preformed while inpatient on 10/28 and showed FEV1 of 29.3%, patient refused repeat PFT prior to discharge.  ??  CKD & Hyperkalemia: likely has component of CKD at baseline with Cr fluctuating around 1.5. Stable today and will continue to monitor. Hyperkalemia thought to be 2/2 recurrent episodes of hyperglycemia and CKD type IV RTA. Stim test with normal response. Started on Flurinef for probable RTA and remains at higher end of normal for K level despite improved glucose control.  ??  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 LFTs were stable throughout admission.     ??Anxiety:??struggles with anxiety and has a history of suicide attempt. We are continuing home meds which include Buspar and Trazodone. Have held home Gabapentin due to lethargy and asterixis.     CF related pancreatic insufficiency:??Zen pep regimen, vitamin regimen, vitamin K twice a week.    Prior to discharge patient was counseled that he had follow-up appointments with pulmonology on May 05, 2018, 10:15 for PFTs and 10:30 with the physician assistant.

## 2018-04-08 LAB — COMPREHENSIVE METABOLIC PANEL
ALBUMIN: 2.6 g/dL — ABNORMAL LOW (ref 3.5–5.0)
ALKALINE PHOSPHATASE: 252 U/L — ABNORMAL HIGH (ref 38–126)
ALT (SGPT): 19 U/L (ref 19–72)
ANION GAP: 7 mmol/L (ref 7–15)
AST (SGOT): 23 U/L (ref 19–55)
BILIRUBIN TOTAL: 0.3 mg/dL (ref 0.0–1.2)
BLOOD UREA NITROGEN: 14 mg/dL (ref 7–21)
BUN / CREAT RATIO: 9
CALCIUM: 8 mg/dL — ABNORMAL LOW (ref 8.5–10.2)
CHLORIDE: 101 mmol/L (ref 98–107)
CREATININE: 1.57 mg/dL — ABNORMAL HIGH (ref 0.70–1.30)
EGFR CKD-EPI AA MALE: 65 mL/min/{1.73_m2} (ref >=60)
EGFR CKD-EPI NON-AA MALE: 56 mL/min/{1.73_m2} — ABNORMAL LOW (ref >=60)
GLUCOSE RANDOM: 102 mg/dL (ref 65–179)
POTASSIUM: 3.7 mmol/L (ref 3.5–5.0)
PROTEIN TOTAL: 5.4 g/dL — ABNORMAL LOW (ref 6.5–8.3)
SODIUM: 138 mmol/L (ref 135–145)

## 2018-04-08 LAB — ALT (SGPT): Alanine aminotransferase:CCnc:Pt:Ser/Plas:Qn:: 19

## 2018-04-08 LAB — ESTIMATED AVERAGE GLUCOSE: Estimated average glucose:MCnc:Pt:Bld:Qn:Estimated from glycated hemoglobin: 335

## 2018-04-08 LAB — C-REACTIVE PROTEIN: C reactive protein:MCnc:Pt:Ser/Plas:Qn:: 40.9 — ABNORMAL HIGH

## 2018-04-08 LAB — MAGNESIUM: Magnesium:MCnc:Pt:Ser/Plas:Qn:: 1.6

## 2018-04-08 LAB — PROTIME: Lab: 11.9

## 2018-04-08 LAB — HEMOGLOBIN A1C: ESTIMATED AVERAGE GLUCOSE: 335 mg/dL

## 2018-04-08 NOTE — Unmapped (Addendum)
Patient admitted from ED last night. Pt A&O until midway through the shift (see previous note). The latter part of night, pt remained resting in bed, no acute events during this time. VSS. Pt medicated with Tylenol for pain with positive results with pt observed sleeping upon reassessment. CIWA score has continued to decrease through the night. Will continue with POC.       Problem: Adult Inpatient Plan of Care  Goal: Plan of Care Review  Outcome: Ongoing - Unchanged  Flowsheets (Taken 04/08/2018 0612)  Progress: no change  Plan of Care Reviewed With: patient  Goal: Patient-Specific Goal (Individualization)  Outcome: Ongoing - Unchanged  Flowsheets (Taken 04/08/2018 0612)  Patient-Specific Goals (Include Timeframe): Patient will be free of physiological withdrawl symptoms by 04/14/18  Individualized Care Needs: Patient prefers to be called Walter [name]  Goal: Absence of Hospital-Acquired Illness or Injury  Outcome: Ongoing - Unchanged  Intervention: Identify and Manage Fall Risk  Flowsheets (Taken 04/07/2018 2155 by Leander Rams, CNA)  Safety Interventions: lighting adjusted for tasks/safety;low bed  Goal: Optimal Comfort and Wellbeing  Outcome: Ongoing - Unchanged  Intervention: Monitor Pain and Promote Comfort  Flowsheets (Taken 04/08/2018 0612)  Pain Management Interventions: awakened for pain meds per patient request; quiet environment facilitated; care clustered  Intervention: Provide Person-Centered Care  Flowsheets (Taken 04/08/2018 0612)  Trust Relationship/Rapport: care explained; empathic listening provided; choices provided  Goal: Readiness for Transition of Care  Outcome: Ongoing - Unchanged  Intervention: Mutually Develop Transition Plan  Flowsheets (Taken 04/08/2018 0612)  Concerns to be Addressed: denies needs/concerns at this time  Goal: Rounds/Family Conference  Outcome: Ongoing - Unchanged  Flowsheets (Taken 04/08/2018 0612)  Clinical EDD (Estimated Discharge Date) : 04/21/2018  Participants: nursing Problem: Infection  Goal: Infection Symptom Resolution  Outcome: Ongoing - Unchanged  Intervention: Prevent or Manage Infection  Flowsheets (Taken 04/08/2018 0612)  Infection Management: aseptic technique maintained  Isolation Precautions: droplet precautions maintained; contact precautions maintained     Problem: Infection (Cystic Fibrosis)  Goal: Absence of Infection Signs/Symptoms  Outcome: Ongoing - Unchanged  Intervention: Manage Infection and Prevent Transmission  Flowsheets (Taken 04/08/2018 0612)  Infection Management: aseptic technique maintained  Isolation Precautions: droplet precautions maintained; contact precautions maintained     Problem: Respiratory Compromise (Cystic Fibrosis)  Goal: Effective Oxygenation and Ventilation  Outcome: Ongoing - Unchanged  Intervention: Promote Airway Secretion Clearance  Flowsheets (Taken 04/08/2018 0612)  Breathing Techniques/Airway Clearance: deep/controlled cough encouraged  Cough And Deep Breathing: done independently per patient  Intervention: Optimize Oxygenation and Ventilation  Flowsheets  Taken 04/07/2018 2155 by Leander Rams, CNA  Head of Bed [facility]): HOB at 20-30 degrees  Taken 04/08/2018 0612 by Leda Min, RN  Airway/Ventilation Management: airway patency maintained

## 2018-04-08 NOTE — Unmapped (Signed)
Endocrinology Consult Follow Up Note    Requesting Attending Physician :  Riccardo Dubin*  Service Requesting Consult : Pulmonology (MDG)  Primary Care Provider: Dellis Filbert, MD  Outpatient Endocrinologist: N/A    Assessment/Recommendations:    Walter [name] is a 37 y.o. 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 I have been asked to see in consultation at the request of [name] MD for evaluation of CFRD.    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 Complicated by infection and noncompliance. Presented in DKA and now off IV insulin infusion. Glycemic control is currently at target. Recommendations are as follows:  -Continue Lantus 30 units qhs  -Continue Lispro 14 units TIDAC (hold if NPO; give half if <75% of meals eaten)  -Continue standard correction scale 2:50>150    The patient was seen and discussed with Dr. Marcello Fennel.    We will continue to follow patient as needed. Please call/page [phone] with questions or concerns.     Farrel Conners, MD  [facility] Endocrinology Fellow    I saw and evaluated patient, participating in the critical and key portions of the service.  I discussed the findings, assessment, and plan with the resident/fellow, and agree with the resident/fellow's findings and plan as documented in the their note.  I was immediately available for the entirety of the of the history taking and examination, and present for key and critical portion thereof.    Thane Edu, MD, MPH  Attending - Endocrinology and Metabolism        History of Present Illness:     Reason for Consult: Hyperglycemia    Walter [name] is a 37 y.o. male admitted for Cystic fibrosis with pulmonary exacerbation (CMS-HCC). I have been asked to evaluate [facility] for CFRD. He has been started on IV antibiotics. He is not on any steroids for this exacerbation.     Interval history: Having mild pain this morning. Still sleepy, had a rapid response overnight for altered mental status. Now AAOx3. Glycemic control is now in target.    History from initial encounter:  Patient is well known to the consult service from prior admissions. He was last seen by our team on 02/03/18 at which point he was receiving Lantus 30 units qhs, Lispro 14 units with meals and on a standard correction scale. His most recent A1c is 9.6% (01/12/18). Patient reports DM medication noncompliance as he was focusing on trying to control his CF. He acknowledged that he takes up to 18 units of lispro with meals and 30 units of lantus at home but is not consistent. He presented with BG >1000 and an elevated anion gap. He received 10 units of regular insulin overnight and 15 units of NOH this morning. He was briefly placed on IV insulin gtt and his anion gap closed. He reports good appetite. Thus, we will restart a prior known in-hospital regimen that worked.    Past Medical History:    Medical History:    Past Medical History:   Diagnosis Date   ??? Alcohol abuse    ??? 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 Bipolar disorder (CMS-HCC)    ??? Chronic pancreatitis (CMS-HCC)    ??? Chronic sinusitis    ??? 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 Cystic fibrosis (CMS-HCC)    ??? Diabetes mellitus (CMS-HCC)     dx in-house 2015   ??? Disease of thyroid  gland    ??? Hypertension    ??? Kidney stones    ??? Portal hypertension (CMS-HCC)        Surgical History:    Past Surgical History:   Procedure Laterality Date   ??? CHOLECYSTECTOMY  2008   ??? sinus surgery     ??? TONSILLECTOMY       Allergies:  Patient has no known allergies.    All Medications:   Current Facility-Administered Medications   Medication Dose Route Frequency Provider Last Rate Last Dose   ??? acetaminophen (TYLENOL) tablet 500 mg  500 mg Oral Q6H PRN Mackey Birchwood, MD   500 mg at 04/08/18 1104   ??? albuterol 2.5 mg /3 mL (0.083 %) nebulizer solution 2.5 mg  2.5 mg Nebulization 4x Daily (RT) Mackey Birchwood, MD   2.5 mg at 04/08/18 1121   ??? busPIRone (BUSPAR) tablet 15 mg  15 mg Oral TID Mackey Birchwood, MD   15 mg at 04/08/18 1104   ??? ceftaroline (TEFLARO) 600 mg in sodium chloride (NS) 0.9 % 50 mL IVPB  600 mg Intravenous Q12H Mackey Birchwood, MD   Stopped at 04/08/18 0350   ??? cholecalciferol (vitamin D3) tablet 7,500 Units  7,500 Units Oral Daily Mackey Birchwood, MD   7,500 Units at 04/08/18 1104   ??? ciprofloxacin HCl (CIPRO) tablet 750 mg  750 mg Oral Q12H [facility] Mackey Birchwood, MD   750 mg at 04/08/18 1105   ??? dextrose (D10W) 10% bolus 125 mL  12.5 g Intravenous Q30 Min PRN Chinelo C Okigbo, MD       ??? dextrose (D10W) 10% bolus 250 mL  250 mL Intravenous Once PRN Raynald Blend, MD       ??? dornase alfa (PULMOZYME) 1 mg/mL nebulizer solution 2.5 mg  2.5 mg Inhalation Daily (RT) Mackey Birchwood, MD   2.5 mg at 04/08/18 1121   ??? folic acid (FOLVITE) tablet 1 mg  1 mg Oral Daily Mackey Birchwood, MD   1 mg at 04/08/18 1105   ??? heparin (porcine) injection 5,000 Units  5,000 Units Subcutaneous [facility] Mackey Birchwood, MD   5,000 Units at 04/08/18 1610   ??? hydrocortisone 1 % cream   Topical Daily PRN Mackey Birchwood, MD       ??? insulin glargine (LANTUS) injection 30 Units  30 Units Subcutaneous Nightly Lovie Chol, MD   30 Units at 04/07/18 2315   ??? insulin lispro (HumaLOG) injection 0-10 Units  0-10 Units Subcutaneous ACHS Lovie Chol, MD   2 Units at 04/07/18 1834   ??? insulin lispro (HumaLOG) injection 14 Units  14 Units Subcutaneous TID AC Chinelo Philip Aspen, MD   14 Units at 04/07/18 1833   ??? ketoconazole (NIZORAL) 2 % cream 1 application  1 application Topical Daily Mackey Birchwood, MD   1 application at 04/08/18 1106   ??? lactulose (CHRONULAC) oral solution (30 mL cup)  20 g Oral BID Mackey Birchwood, MD   20 g at 04/06/18 0833   ??? lidocaine (LIDODERM) 5 % patch 2 patch  2 patch Transdermal Q24H Mackey Birchwood, MD   Stopped at 04/07/18 0941   ??? lipase-protease-amylase (ZENPEP) 20,000-63,000- 84,000 unit capsule, delayed release 240,000 units of lipase  12 capsule Oral 3xd Meals Mackey Birchwood, MD 240,000 units of lipase at 04/07/18 1336   ??? magnesium oxide (MAG-OX) tablet 400 mg  400 mg Oral BID Mackey Birchwood, MD   400 mg at 04/08/18 1104   ??? melatonin tablet 3 mg  3 mg Oral QPM Mackey Birchwood, MD   3 mg at 04/07/18 2308   ??? meropenem (MERREM) 2 g in sodium chloride (NS) 0.9 % 100 mL IVPB  2 g Intravenous Q12H Azucena Freed, MD       ??? mirtazapine (REMERON) tablet 45 mg  45 mg Oral Nightly Mackey Birchwood, MD   45 mg at 04/07/18 2228   ??? MVW Complete (pediatric multivit 61-D3-vit K) 1,500-800 unit-mcg 2 capsule  2 capsule Oral Daily Mackey Birchwood, MD   2 capsule at 04/08/18 1103   ??? OLANZapine (ZYPREXA) tablet 15 mg  15 mg Oral Nightly Mackey Birchwood, MD   15 mg at 04/07/18 2249   ??? OLANZapine (ZYPREXA) tablet 2.5 mg  2.5 mg Oral BID PRN Azucena Freed, MD       ??? oxyCODONE (ROXICODONE) 5 MG immediate release tablet            ??? oxyCODONE (ROXICODONE) immediate release tablet 5 mg  5 mg Oral Q8H PRN Mackey Birchwood, MD       ??? pantoprazole (PROTONIX) EC tablet 40 mg  40 mg Oral BID with meals Mackey Birchwood, MD   40 mg at 04/08/18 1105   ??? phytonadione (vitamin K1) (MEPHYTON) tablet 5 mg  5 mg Oral Q W and Sa Mackey Birchwood, MD   5 mg at 04/06/18 1610   ??? polyethylene glycol (MIRALAX) packet 17 g  17 g Oral BID Mackey Birchwood, MD   17 g at 04/06/18 9604   ??? polyethylene glycol (MIRALAX) packet 17 g  17 g Oral TID PRN Mackey Birchwood, MD       ??? sodium chloride 7% nebulizer solution 4 mL  4 mL Nebulization 4x Daily (RT) Mackey Birchwood, MD   4 mL at 04/08/18 1121   ??? thiamine (B-1) tablet 200 mg  200 mg Oral [facility] Mackey Birchwood, MD   200 mg at 04/08/18 5409    Followed by   ??? [START ON 04/11/2018] thiamine (B-1) tablet 200 mg  200 mg Oral Daily Mackey Birchwood, MD       ??? traZODone (DESYREL) tablet 50 mg  50 mg Oral Nightly Mackey Birchwood, MD   50 mg at 04/07/18 2228       Social History:     Social History     Socioeconomic History   ??? Marital status: Single     Spouse name: None   ??? Number of children: 0   ??? Years of education: 12+   ??? Highest education level: Bachelor's degree (e.g., BA, AB, BS)   Occupational History   ??? Occupation: Restaurant manager, fast food     Comment: works at [employer]   ??? Financial resource strain: Very hard   ??? Food insecurity:     Worry: Often true     Inability: Often true   ??? Transportation needs:     Medical: No     Non-medical: No   Tobacco Use   ??? Smoking status: Former Smoker     Packs/day: 1.00     Years: 3.00     Pack years: 3.00     Types: Cigarettes   ??? Smokeless tobacco: Never Used   ??? Tobacco comment: reports quit prior to last admission   Substance and Sexual Activity   ??? Alcohol use: Yes     Alcohol/week: 0.0 standard drinks     Comment: A fifth of [place] per day    ??? Drug use: Not  Currently     Comment: hx of alcohol and prescription pain medication abuse   ??? Sexual activity: Not Currently     Partners: Female   Lifestyle   ??? Physical activity:     Days per week: 0 days     Minutes per session: 0 min   ??? Stress: Very much   Relationships   ??? Social connections:     Talks on phone: Twice a week     Gets together: Once a week     Attends religious service: Never     Active member of club or organization: No     Attends meetings of clubs or organizations: Never     Relationship status: Never married   Other Topics Concern   ??? 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 Hillard Danker with [facility] in [place]    Current therapist: Nehemiah Settle with CF team        SUBSTANCE ABUSE HX:     # MJ     -Current use: once in awhile    # Alcohol     -Current use: Was sober from 2016 until two weeks ago. Admits to drinking enough to get drunk and admits to passing out. Denies history of complicated withdrawal. Admits to withdrawal symptoms of tremors, anxiety, headache, and tactile hallucinations at times.     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    Code Status:  Full Code    Review of Systems:  A 12 system review of systems was negative except as noted in HPI.    Objective:     BP 151/92  - Pulse 82  - Temp 36.6 ??C (Oral)  - Resp 16  - Ht 182.9 cm (6')  - Wt 76.2  kg (167 lb 14.4 oz)  - SpO2 93%  - BMI 22.77 kg/m??     Physical Exam:  Constitutional: alert, no acute distress  ENT: eyes closed, mucous membranes moist  CVS: RRR no MRG, S1S2 distinct  Resp: clear to auscultation, symmetric air entry  GI: soft, nontender, nondistended  MSK: no obvious joint deformity, feet-no obvious cuts, scabs, bruises, sensation intact  Neurological: awake, oriented to person, place, and time, normal speech.  Lymph: peripheral pulses normal, no pedal edema  Skin: normal coloration and turgor, no rashes  Psych: normal affect      Test Results    Data Review  Lab Results   Component Value Date    POCGLU 150 04/08/2018    POCGLU 103 04/08/2018    POCGLU 154 04/08/2018    POCGLU 114 04/07/2018    POCGLU 182 (H) 04/07/2018    POCGLU 247 (H) 04/07/2018    POCGLU 206 (H) 04/07/2018       Lab Results   Component Value Date    A1C 13.3 (H) 04/06/2018    A1C 9.6 (H) 01/12/2018    A1C 11.9 (H) 11/17/2017     Lab Results   Component Value Date    GFR >= 60 10/31/2010    CREATININE 1.57 (H) 04/08/2018     Lab Results   Component Value Date    CHOL 116 01/29/2018     Lab Results   Component Value Date    HDL 61 (H) 01/29/2018     Lab Results   Component Value Date    LDL 35 (L) 01/29/2018     Lab Results   Component Value Date    TRIG 102 01/29/2018

## 2018-04-08 NOTE — Unmapped (Signed)
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  Walter [name] is a 37 y.o. 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, anxiety, bipolar disorder, prior suicide attempt, prior tobacco and alcohol use that presented to [facility] with Cystic fibrosis with pulmonary exacerbation and found to be hyperglycemic >1000.     Hyperglycemia, CF related DM: A1C 10/19 13.3%, glucose is better controlled with morning glucose. Resuming home nightly Lantus, home meal time, SSI  -endo recs appreciated    Substance abuse: Concern for alcohol withdrawals requiring ativan and became drowsy. Holding sedating medications, monitor with CIWA but not linking to Ativan dose. Concern episode last night was exaggerated as I would have expected vitals to be more abnormal for a high CIWA.   - CTM closely    Hypoxia in setting of CF bronchiectasis exacerbation: Currently on roomair. Recent FEV1 of 35%. Prior cultures with mucoid and smooth PsA, H flu, achromobacter. RPP negative. CRP elevated to 5. Sputum cultures are pending, Mero/Ceftaroline/Cipro (10/22-) anticipate 2 week course while inpatient  -airway clearance with vest, HTS, albuterol,pulmozyme    AKI: likely has component of CKD at baseline with Cr fluctuating around 1.5. Bump today, give gentle fluids expect it is due to decreased po intake  -CTM  -encourage po intake    Cirrhosis 2/2 CF related liver disease: no ascites or new swelling appreciated on physical exam. He has not experienced any change in swelling lately. Initial AST (37) ALT (21), elevated ALP to 353 and INR within normal limits.   - trend ALP (downtrending)  - CTM, abdominal ultrasound if changes in abdominal symptoms  - HCC and variceal screening on discharge    Anxiety: struggles with anxiety and has a history of suicide attempt. We are continuing home meds which include Buspar and Trazodone. Psych consult with recs appreciated.   - holding gabapentin 2/2 asterixis and lethargy    CF related pancreatic insufficiency: Zen pep regimen, vitamin regimen, vitamin K twice a week.    Hypopigmented rash: pytriasis versicolor vs. Livedo reticularis appearance  -ketoconazole topical     LOS: 2 days     Subjective:    Sleepy this morning, complaining of shaking and sweats similar to alcohol withdrawals in the past. Last drink was 1/5th Soco day before he came to the hospital. Elevated CIWA score and was given Ativan 4 mg last night and became drowsy. He is having BMs.    Objective:    Vital signs in last 24 hours:  Temp:  [36.6 ??C-37.3 ??C] 36.6 ??C  Heart Rate:  [82-101] 82  Resp:  [16-18] 16  BP: (102-171)/(56-106) 151/92  FiO2 (%):  [97 %] 97 %  SpO2:  [91 %-98 %] 93 %  BMI (Calculated):  [22.77] 22.77    Labs: Reviewed    Medications Changes:  Imaging:Reviewed    Physical Exam:  General Appearrance: Alert and oriented x2, NAD, chronically ill appearing, sleeping, no sweating, asterixis  HEENT: EOMI, anicteric.  Heart: RRR, no murmurs rubs or gallops, normal S1 and S2.   Lungs: coarse breath sounds with poor air movement bilaterally, no focal sounds appreciated.  Abdomen: soft NT ND  Extremities: No pitting edema, no tenderness to palpation, no tremors.  Psych: Flat affect, normal speech.  Skin: hypopigmented rash on neck

## 2018-04-08 NOTE — Unmapped (Signed)
Cystic Fibrosis Nutrition Assessment    Inpatient: MD Consult this admission and related follow up   Primary Pulmonary Provider: Dr. Lurena Nida  ==================================================================  Walter [name] is a 37 y.o. 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 On admit note elevated blood glucose levels - per MD H&P On presentation, glucose 1,190 with elevated anion gap 16 and elevated beta-hydroxybutyrate after drinking 4 sodas in ED waiting room.   - Endocrinology following, now off IV insulin infusion.  - Upon RD visit today, he is resting in bed. Declines PO supplements/snacks. RN at beside as well.  ===================================================================  INTERVENTION:  1. Encouraged PO intake.   - Consider starting PO supplement - 1 Ensure Plus daily to help meet increased nutritional needs but with lower potassium & carbohydrate content than SuperShakes.  (1 Ensure Plus provides 350 kcals, 13 grams protein, 50 grams carbohydrate, 400 mg potassium where as 1 SuperShake provides ~ 630 kcals, 14 grams protein, 81 grams carbohydrate, 729 mg potassium)    - Note during previous admit Lannis had elevated K+ levels. During that admit RD provided verbal/written education re: potassium content of foods available on [facility] menu as well as education for home.    2. [name] home enzyme regimen Zenpep 40,000 is not available on inpatient drug formulary, typically substitute with Zenpep 20,000 during admissions.  - Recommend decrease Zenpep 20,000 to  (9 at meals, 5 at snacks) provides 2,545 units lipase/kg/meal & 11,881 units lipase/kg/day.    3. Continue CF vitamin regimen:  - MVW Complete Formulation capsules 2 daily - during admission  - vitamin D3/cholecalciferol 7,500 units daily - during admission    4. PT WNL on admit - agree with 5mg  phytonadione twice weekly only during admission.    5. Weigh patient twice weekly this admit.    6. Continue remainder of nutrition regimen:  - acid reducer    - insulin regimen per MD team  - bowel regimen   - remeron    Inpatient:   Will follow up with patient per protocol: 1-2 times per week (and more frequent as indicated)  ===================================================================  ASSESSMENT:  Cystic Fibrosis Nutrition Category = Urgent Need     Current diet is appropriate for CF. Current PO intake is not adequate to meet estimated CF needs. Patient would benefit from adjustment in enzyme regimen. Vitamin prescription is appropriate to reach/maintain optimal fat soluble vitamin levels. Patient may benefit from vitamin K supplementation while on IV antibiotics. Bowel regimen is appropriate. Acid reducer appropriate for GERD and enzyme activation. Insulin as appropriate per Endocrinology for glucose management.. Suspect weight changes this year could be multifactorial - poor glucose management (HGB A1Cs have ranged from 8.4% to 11.9% since November 2018) , variable PO intake, increase needs related to pulmonary status (FEV1 35% in April 2019, noting occasional need for supplemental oxygen).    Malnutrition Assessment using AND/ASPEN Clinical Characteristics:  Deferred NFPE.     Goals:  1. Meet estimated daily needs: 3314 kcals while inpatient considering lower activity factor, recommend increase to 4141 kcals when outpatient considering higher activity factor; 84-112 gm pro; 2514 mL free water      Calories estimated using: Cystic Fibrosis Conference Formula, fluid per Holiday Segar, protein per DRI x 1.5-2  2. Reach/maintain established goals for CF:                Adult - BMI 22kg/m2 for CF females and 23kg/m2 for CF males    Pediatric - BMI or wt/ht  ratio > 50%ile for age  39. Normal fat-soluble vitamin levels: Vitamin A, Vitamin E and PT per lab range; Vitamin D 25OH total >30  4. Maintain glucose control. Carbohydrate content of diet should comprise 40-50% of total calorie needs, but carbohydrates are not restricted in this population.    5. Meet sodium needs for CF  ===================================================================  INPATIENT:  Current Nutrition Orders (inpatient):       Nutrition Orders   (From admission, onward)             Start     Ordered    04/07/18 1334  Nutrition Therapy High Calorie High Protein  Effective now     Question:  Nutrition Therapy (T):  Answer:  High Calorie High Protein    04/07/18 1333              Nutrition Hx:  lanuts, lispro; home Zenpep 40,000 (5 at meals, 3 at snacks - provides 2828 units lipase/kg/meal & 62130 units lipase/kg/day) substitute while inpatient with Zenpep 20,000, did not tolerate Creon;  2 MVW complete formulation D30000 chewables daily; miralax, protonix, ursodisol; Hx of drinking chocolate Ensure Plus during admits; remeron for appetite. Hx Alcohol Abuse noted.    CF Nutrition related medications (inpatient): Nutritionally relevant medications reviewed.   7500 International units vitamin D3 daily  Folic acid  Thiamine   lantus  HumaLOG  Lactulose BID  Zenpep 20,000 (12 at meals)  Magnesium oxide  Remeron 45 mg nightly  2 MVW Complete Formulation capsules daily  protonix  5 mg phytonadione twice weekly  miralax 17 grams BID  miralax PRN    CF Nutrition related labs (inpatient):   04-08-18: Na+/K+ WNL, Glu 102, PT WNL  Recent Labs   Lab Units 04/08/18  0748 04/08/18  0034 04/07/18  2155 04/07/18  1814 04/07/18  1200   POC GLUCOSE mg/dL [phone] 295* 284*   04-06-18: Glu 1,190  - elevated, Na+ 123 - low, K+ 5.6 - elevated, CRP 53 - elevated, PT 12 - WNL  ==================================================================  CLINICAL DATA:  Past Medical History:   Diagnosis Date   ??? Alcohol abuse    ??? 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 Bipolar disorder (CMS-HCC)    ??? Chronic pancreatitis (CMS-HCC)    ??? Chronic sinusitis    ??? 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 Cystic fibrosis (CMS-HCC)    ??? Diabetes mellitus (CMS-HCC)     dx in-house 2015 ??? Disease of thyroid gland    ??? Hypertension    ??? Kidney stones    ??? Portal hypertension (CMS-HCC)      Anthroprometric Evaluation:  Weight changes: New weight obtained this admit, improved from 10-14.    Last 5 Recorded Weights    04/07/18 2125   Weight: 76.2 kg (167 lb 14.4 oz)     BMI Readings from Last 3 Encounters:   04/07/18 22.77 kg/m??   03/28/18 20.56 kg/m??   02/28/18 21.93 kg/m??     Wt Readings from Last 12 Encounters:   04/07/18 76.2 kg (167 lb 14.4 oz)   03/28/18 70.7 kg (155 lb 12.8 oz)   02/28/18 75.4 kg (166 lb 3.2 oz)   02/01/18 76.2 kg (167 lb 15.9 oz)   01/05/18 67.9 kg (149 lb 9.6 oz)   11/21/17 65.4 kg (144 lb 2.9 oz)   09/29/17 67.6 kg (149 lb)   09/07/17 70.4 kg (155 lb 4.8 oz)   07/30/17 72.6 kg (  160 lb)   07/11/17 80.9 kg (178 lb 4.8 oz)   06/30/17 74.8 kg (165 lb)   06/20/17 75.2 kg (165 lb 12.6 oz)     Ht Readings from Last 3 Encounters:   04/07/18 182.9 cm (6')   03/28/18 185.4 cm (6' 0.99)   02/28/18 185.4 cm (6' 0.99)   ==================================================================  Fat-soluble vitamin levels: low related to chronic non-compliance  Lab Results   Component Value Date/Time    VITAMINA 22.4 (L) 03/19/2016 1529     Lab Results   Component Value Date/Time    VITDTOTAL 23.2 01/29/2018 2048    VITDTOTAL 20.7 11/17/2017 1054    VITDTOTAL 9.5 (L) 04/20/2017 0531    VITDTOTAL 23.1 02/13/2017 0558    VITDTOTAL 8.8 (L) 02/02/2017 0534    VITDTOTAL 20 03/19/2016 1529     Lab Results   Component Value Date/Time    VITAME 4.5 (L) 03/19/2016 1529     Lab Results   Component Value Date/Time    PT 11.9 04/08/2018 0558    PT 12.1 04/07/2018 0816    PT 12.0 04/06/2018 0332    PT 10.9 01/26/2018 1230    PT 13.1 (H) 01/12/2018 1140    PT 11.1 10/31/2010 1115     Bone Health: Abnormal vitamin D level, being treated. 2011 DEXA showed normal bone density.    CF Related Diabetes: Yes. Prescribed insulin regimen, however note non-compliance.  Lab Results   Component Value Date/Time    A1C 13.3 (H) 04/06/2018 0117    A1C 8.8 (H) 11/25/2015 1426    A1C 9.3 (H) 04/16/2014 1604

## 2018-04-08 NOTE — Unmapped (Signed)
Problem: Infection (Cystic Fibrosis)  Goal: Absence of Infection Signs/Symptoms  Outcome: Progressing     Problem: Respiratory Compromise (Cystic Fibrosis)  Goal: Effective Oxygenation and Ventilation  Outcome: Progressing   Pt tol tx well , refused airway clearance, pt resting

## 2018-04-08 NOTE — Unmapped (Signed)
Care Management  Initial Transition Planning Assessment      Walter [name] is a 37 y.o. 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, anxiety, bipolar disorder, prior suicide attempt, prior tobacco and alcohol use that presented to [facility] with Cystic fibrosis                 General  Care Manager assessed the patient by : In person interview with patient, Medical record review, Discussion with Clinical Care team  Orientation Level: Disoriented to situation  Who provides care at home?: N/A    Contact/Decision Maker        Extended Emergency Contact Information  Primary Emergency Contact: Mellody [place]  Home Phone: [phone]  Mobile Phone: [phone]  Relation: Mother  Secondary Emergency Contact: Dwight, Adamczak  Home Phone: [phone]  Relation: Sister    Legal Next of Kin / Guardian / POA / Advance Directives       Advance Directive (Medical Treatment)  Does patient have an advance directive covering medical treatment?: Patient does not have advance directive covering medical treatment.  Reason patient does not have an advance directive covering medical treatment:: Patient does not wish to complete one at this time  Reason there is not a Health Care Decision Maker appointed:: Patient does not wish to appoint a Health Care Decision Maker at this time  Information provided on advance directive:: No  Patient requests assistance:: No    Advance Directive (Mental Health Treatment)  Does patient have an advance directive covering mental health treatment?: Patient does not have advance directive covering mental health treatment.  Reason patient does not have an advance directive covering mental health treatment:: Patient does not wish to complete one at this time.    Patient Information  Lives with: Friends    Type of Residence: Private residence    Type of Residence: Mailing Address:  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  Past Surgical History:   has a past surgical history that includes Tonsillectomy; sinus surgery; and Cholecystectomy (2008).   Previous admit date: 01/12/2018    Primary Insurance- Payor: BCBS / Plan: BCBS BLUE OPTIONS/PPO/ADV / Product Type: *No Product type* /   Secondary Insurance ??? None  Prescription Coverage ???   Preferred Pharmacy - [id number] - [place], [place] - [address] AT [facility] [address]  [facility]  [facility][id number] - [place], [place] - [address]  [facility][id number] - [place], [place] - [address] AT [facility] OF [address]    Transportation home: Private vehicle  Level of function prior to admission: Independent     Location/Detail: [address]    Support Systems: Family Members, Friends/Neighbors, Parent    Responsibilities/Dependents at  home?: No    Home Care services in place prior to admission?: No     Outpatient/Community Resources in place prior to admission: Clinic  Agency detail (Name/Phone #): [facility] [phone]    Equipment Currently Used at Home: respiratory supplies     Current HME Agency (Name/Phone #): percussion vest, nebulizer    Currently receiving outpatient dialysis?: No     Financial Information     Need for financial assistance?: No     Social Determinants of Health  Social Determinants of Health were addressed in provider documentation.  Please refer to patient history.    Discharge Needs Assessment  Concerns to be Addressed: denies needs/concerns at this time    Clinical Risk Factors: Multiple Diagnoses (Chronic)    Barriers to taking medications: No    Prior overnight hospital stay or ED visit in last 90 days: Yes    Readmission Within the Last 30 Days: previous discharge plan unsuccessful    Anticipated Changes Related to Illness: none    Equipment Needed After Discharge: none    Discharge Facility/Level of Care Needs: other (see comments)(home to self care)    Readmission  Risk of Unplanned Readmission Score: UNPLANNED READMISSION SCORE: 43%  Readmitted Within the Last 30 Days?        Discharge Plan  Screen findings are: Care Manager reviewed the plan of the patient's care with the Multidisciplinary Team. No discharge planning needs identified at this time. Care Manager will continue to manage plan and monitor patient's progress with the team.    Expected Discharge Date: 04/21/18    Expected Transfer from Critical Care:      Patient and/or family were provided with choice of facilities / services that are available and appropriate to meet post hospital care needs?: N/A     Initial Assessment complete?: Yes

## 2018-04-09 LAB — COMPREHENSIVE METABOLIC PANEL
ALBUMIN: 2.9 g/dL — ABNORMAL LOW (ref 3.5–5.0)
ANION GAP: 10 mmol/L (ref 7–15)
AST (SGOT): 37 U/L (ref 19–55)
BILIRUBIN TOTAL: 0.3 mg/dL (ref 0.0–1.2)
BLOOD UREA NITROGEN: 14 mg/dL (ref 7–21)
BUN / CREAT RATIO: 10
CALCIUM: 8.2 mg/dL — ABNORMAL LOW (ref 8.5–10.2)
CHLORIDE: 102 mmol/L (ref 98–107)
CO2: 25 mmol/L (ref 22.0–30.0)
CREATININE: 1.44 mg/dL — ABNORMAL HIGH (ref 0.70–1.30)
EGFR CKD-EPI AA MALE: 72 mL/min/{1.73_m2} (ref >=60)
EGFR CKD-EPI NON-AA MALE: 62 mL/min/{1.73_m2} (ref >=60)
GLUCOSE RANDOM: 372 mg/dL — ABNORMAL HIGH (ref 65–179)
POTASSIUM: 4.7 mmol/L (ref 3.5–5.0)
PROTEIN TOTAL: 5.8 g/dL — ABNORMAL LOW (ref 6.5–8.3)
SODIUM: 137 mmol/L (ref 135–145)

## 2018-04-09 LAB — INR: Lab: 0.92

## 2018-04-09 LAB — MAGNESIUM: Magnesium:MCnc:Pt:Ser/Plas:Qn:: 1.7

## 2018-04-09 LAB — CALCIUM: Calcium:MCnc:Pt:Ser/Plas:Qn:: 8.2 — ABNORMAL LOW

## 2018-04-09 NOTE — Unmapped (Signed)
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  Walter [name] is a 37 y.o. 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     Hyperglycemia, CF related DM: A1C 10/19 13.3%, glucose is better controlled. Had elevated BS this morning 2/2 to missing dose of insulin yesterday. Will continue nightly Lantus and meal time and SSI. CTM and adjust as needed.   -endo recs appreciated    Substance abuse: Concern for alcohol withdrawals requiring ativan and became drowsy. Holding sedating medications. Concern previous episode was exaggerated so plan to monitor with vitals.   - CTM closely  - MVI, thiamine, folate    CF exacerbation with acute exacerbation of bronchiectasis:  Prior cultures with mucoid and smooth PsA, H flu, achromobacter. Sputum cultures are pending, Mero/Ceftaroline/Cipro (10/22-) anticipate 2 week course while inpatient, will discuss with Dr. Lurena Nida about placing PICC line vs. Port due to history of difficulty with PICC line in past however port placement is somewhat more invasive.   -airway clearance with vest, HTS, albuterol,pulmozyme  -repeat PFTs    AKI: likely has component of CKD at baseline with Cr fluctuating around 1.5. Near baseline today.  -CTM  -encourage po intake    Cirrhosis 2/2 CF related liver disease: no ascites or new swelling appreciated on physical exam. He has not experienced any change in swelling lately. Initial AST (37) ALT (21), elevated ALP to 353 and INR within normal limits.   - trend ALP (downtrending)  - CTM, abdominal ultrasound if changes in abdominal symptoms  - HCC and variceal screening on discharge    Anxiety: struggles with anxiety and has a history of suicide attempt. We are continuing home meds which include Buspar and Trazodone. Psych consult with recs appreciated.   - holding gabapentin 2/2 asterixis and lethargy    CF related pancreatic insufficiency: Zen pep regimen, vitamin regimen, vitamin K twice a week.    Hypopigmented rash: pytriasis versicolor vs. Livedo reticularis appearance  -ketoconazole topical     LOS: 3 days     Subjective:    NAEON, states he did not notify nursing last night before eating so missed a dose of insulin. Feels that his breathing and pain is improved.     Objective:    Vital signs in last 24 hours:  Temp:  [36.7 ??C-37.1 ??C] 36.7 ??C  Heart Rate:  [77-89] 77  Resp:  [16-18] 16  BP: (160-161)/(96-103) 160/103  SpO2:  [91 %-97 %] 95 %    Labs: Reviewed    Medications Changes:    Imaging:Reviewed    Physical Exam:  General Appearrance: Alert and oriented x2, NAD, chronically ill appearing,  resting comfortably, no sweating, mild asterixis improved from previous  HEENT: EOMI, anicteric.   Heart: RRR, no murmurs rubs or gallops, normal S1 and S2.   Lungs: CTAB, no WOB, on room air, no focal sounds appreciated.  Abdomen: soft NT ND  Extremities: No pitting edema, no tenderness to palpation, no tremors.  Psych: Flat affect, normal speech.  Skin: hypopigmented rash on neck

## 2018-04-09 NOTE — Unmapped (Signed)
Patient A&O x3, continues to be disoriented to situation. Pt's clave was discovered removed from IV (still attached to IV tubing) and was educated on not disconnecting line without assistance from RN. Pt has rested well during the shift and has been easily arousable throughout the night. Pt has remained free from injury and falls. VSS. Will continue with POC.       Problem: Adult Inpatient Plan of Care  Goal: Plan of Care Review  Outcome: Progressing  Flowsheets  Taken 04/09/2018 0311  Progress: improving  Taken 04/08/2018 0612  Plan of Care Reviewed With: patient  Goal: Patient-Specific Goal (Individualization)  Outcome: Progressing  Flowsheets (Taken 04/08/2018 0612)  Patient-Specific Goals (Include Timeframe): Patient will be free of physiological withdrawl symptoms by 04/14/18  Individualized Care Needs: Patient prefers to be called Walter [name]  Goal: Absence of Hospital-Acquired Illness or Injury  Outcome: Progressing  Intervention: Identify and Manage Fall Risk  Flowsheets (Taken 04/08/2018 2100 by Leander Rams, CNA)  Safety Interventions: isolation precautions;lighting adjusted for tasks/safety;low bed;nonskid shoes/slippers when out of bed  Goal: Optimal Comfort and Wellbeing  Outcome: Progressing  Intervention: Monitor Pain and Promote Comfort  Flowsheets (Taken 04/08/2018 0612)  Pain Management Interventions: awakened for pain meds per patient request;quiet environment facilitated;care clustered  Goal: Readiness for Transition of Care  Outcome: Progressing  Intervention: Mutually Develop Transition Plan  Flowsheets (Taken 04/08/2018 1306 by Elease Etienne, RN)  Equipment Currently Used at Home: respiratory supplies  Concerns to be Addressed: denies needs/concerns at this time  Goal: Rounds/Family Conference  Outcome: Progressing  Flowsheets  Taken 04/08/2018 1306 by Elease Etienne, RN  Clinical EDD (Estimated Discharge Date) : 04/21/18  Taken 04/08/2018 0612 by Leda Min, RN  Participants: nursing Problem: Infection  Goal: Infection Symptom Resolution  Outcome: Progressing  Intervention: Prevent or Manage Infection  Flowsheets (Taken 04/08/2018 2100 by Leander Rams, CNA)  Infection Management: aseptic technique maintained  Isolation Precautions: contact precautions maintained     Problem: Infection (Cystic Fibrosis)  Goal: Absence of Infection Signs/Symptoms  Outcome: Progressing  Intervention: Manage Infection and Prevent Transmission  Flowsheets (Taken 04/08/2018 2100 by Leander Rams, CNA)  Infection Management: aseptic technique maintained  Isolation Precautions: contact precautions maintained     Problem: Respiratory Compromise (Cystic Fibrosis)  Goal: Effective Oxygenation and Ventilation  Outcome: Progressing     Problem: Diabetes Comorbidity  Goal: Blood Glucose Level Within Desired Range  Outcome: Progressing

## 2018-04-09 NOTE — Unmapped (Signed)
Patient drowsy but arousable. Patient disoriented to time. Patient received medications per [facility]. Patient received IV abx and tolerated them well. Will continue POC.     Problem: Adult Inpatient Plan of Care  Goal: Plan of Care Review  Outcome: Progressing  Goal: Patient-Specific Goal (Individualization)  Outcome: Progressing  Goal: Absence of Hospital-Acquired Illness or Injury  Outcome: Progressing  Intervention: Identify and Manage Fall Risk  Flowsheets (Taken 04/08/2018 2026)  Safety Interventions: aspiration precautions; fall reduction program maintained  Intervention: Prevent Skin Injury  Flowsheets (Taken 04/07/2018 2155 by Leander Rams, CNA)  Pressure Reduction Techniques: frequent weight shift encouraged  Intervention: Prevent VTE (venous thromboembolism)  Flowsheets (Taken 04/08/2018 2026)  VTE Prevention/Management: ambulation promoted  Intervention: Prevent Infection  Flowsheets (Taken 04/08/2018 2026)  Infection Prevention: handwashing promoted  Goal: Optimal Comfort and Wellbeing  Outcome: Progressing  Intervention: Monitor Pain and Promote Comfort  Flowsheets (Taken 04/08/2018 0612 by Leda Min, RN)  Pain Management Interventions: awakened for pain meds per patient request;quiet environment facilitated;care clustered     Problem: Infection  Goal: Infection Symptom Resolution  Outcome: Progressing     Problem: Infection (Cystic Fibrosis)  Goal: Absence of Infection Signs/Symptoms  Outcome: Ongoing - Unchanged     Problem: Respiratory Compromise (Cystic Fibrosis)  Goal: Effective Oxygenation and Ventilation  Outcome: Ongoing - Unchanged

## 2018-04-10 LAB — COMPREHENSIVE METABOLIC PANEL
ALBUMIN: 2.8 g/dL — ABNORMAL LOW (ref 3.5–5.0)
ALKALINE PHOSPHATASE: 375 U/L — ABNORMAL HIGH (ref 38–126)
ALT (SGPT): 27 U/L (ref 19–72)
ANION GAP: 6 mmol/L — ABNORMAL LOW (ref 7–15)
AST (SGOT): 59 U/L — ABNORMAL HIGH (ref 19–55)
BILIRUBIN TOTAL: 0.2 mg/dL (ref 0.0–1.2)
BLOOD UREA NITROGEN: 17 mg/dL (ref 7–21)
BUN / CREAT RATIO: 11
CALCIUM: 8.2 mg/dL — ABNORMAL LOW (ref 8.5–10.2)
CHLORIDE: 98 mmol/L (ref 98–107)
CO2: 31 mmol/L — ABNORMAL HIGH (ref 22.0–30.0)
CREATININE: 1.6 mg/dL — ABNORMAL HIGH (ref 0.70–1.30)
EGFR CKD-EPI AA MALE: 64 mL/min/{1.73_m2} (ref >=60)
EGFR CKD-EPI NON-AA MALE: 55 mL/min/{1.73_m2} — ABNORMAL LOW (ref >=60)
POTASSIUM: 5.6 mmol/L — ABNORMAL HIGH (ref 3.5–5.0)
PROTEIN TOTAL: 5.6 g/dL — ABNORMAL LOW (ref 6.5–8.3)
SODIUM: 135 mmol/L (ref 135–145)

## 2018-04-10 LAB — CBC
HEMATOCRIT: 31.2 % — ABNORMAL LOW (ref 41.0–53.0)
MEAN CORPUSCULAR HEMOGLOBIN CONC: 32.2 g/dL (ref 31.0–37.0)
MEAN CORPUSCULAR VOLUME: 89.3 fL (ref 80.0–100.0)
MEAN PLATELET VOLUME: 7.4 fL (ref 7.0–10.0)
RED BLOOD CELL COUNT: 3.49 10*12/L — ABNORMAL LOW (ref 4.50–5.90)
RED CELL DISTRIBUTION WIDTH: 14.6 % (ref 12.0–15.0)
WBC ADJUSTED: 5.5 10*9/L (ref 4.5–11.0)

## 2018-04-10 LAB — TOXICOLOGY SCREEN, URINE
AMPHETAMINE SCREEN URINE: <500
BARBITURATE SCREEN URINE: <200
BENZODIAZEPINE SCREEN, URINE: <200
COCAINE(METAB.)SCREEN, URINE: <150
METHADONE SCREEN, URINE: <300
OPIATE SCREEN URINE: <300

## 2018-04-10 LAB — POTASSIUM
Potassium:SCnc:Pt:Ser/Plas:Qn:: 5.1 — ABNORMAL HIGH
Potassium:SCnc:Pt:Ser/Plas:Qn:: 5.3 — ABNORMAL HIGH

## 2018-04-10 LAB — BARBITURATE SCREEN URINE: Lab: <200

## 2018-04-10 LAB — BILIRUBIN TOTAL: Bilirubin:MCnc:Pt:Ser/Plas:Qn:: 0.2

## 2018-04-10 LAB — HEMATOCRIT: Lab: 31.2 — ABNORMAL LOW

## 2018-04-10 LAB — INR: Lab: 0.92

## 2018-04-10 LAB — MAGNESIUM: Magnesium:MCnc:Pt:Ser/Plas:Qn:: 1.6

## 2018-04-10 NOTE — Unmapped (Addendum)
Endocrinology Consult Follow Up Note    Requesting Attending Physician :  Riccardo Dubin*  Service Requesting Consult : Pulmonology (MDG)  Primary Care Provider: Dellis Filbert, MD  Outpatient Endocrinologist: N/A    Assessment/Recommendations:    Walter [name] is a 37 y.o. 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 I have been asked to see in consultation at the request of [name] MD for evaluation of CFRD.    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 Complicated by infection and noncompliance. Presented in DKA and now off IV insulin infusion. Glycemic control above target. Recommendations are as follows:  -Continue Lantus 30 units qhs  -Increase to Lispro 16 units TIDAC (hold if NPO; give half if <75% of meals eaten)  -Continue standard correction scale 2:50>150    The patient was seen and discussed with Dr. Naoma Diener.    We will continue to follow patient as needed. Please call/page [phone] with questions or concerns.     Farrel Conners, MD  [facility] Endocrinology Fellow    Teaching physician attestation:  I saw and evaluated the patient, participating in the key portions of the service.?? I reviewed the resident???s note.?? I agree with the resident???s findings and plan.   ??  Leandra Kern, MD  ------------------------------------------------------------    History of Present Illness:     Reason for Consult: Hyperglycemia    Walter [name] is a 37 y.o. male admitted for Cystic fibrosis with pulmonary exacerbation (CMS-HCC). I have been asked to evaluate [name] for CFRD. He has been started on IV antibiotics. He is not on any steroids for this exacerbation.     Interval history: BG in the 400s this morning and patient reports he did not get insulin coverage for dinner. Patient denies any symptoms.      History from initial encounter:  Patient is well known to the consult service from prior admissions. He was last seen by our team on 02/03/18 at which point he was receiving Lantus 30 units qhs, Lispro 14 units with meals and on a standard correction scale. His most recent A1c is 9.6% (01/12/18). Patient reports DM medication noncompliance as he was focusing on trying to control his CF. He acknowledged that he takes up to 18 units of lispro with meals and 30 units of lantus at home but is not consistent. He presented with BG >1000 and an elevated anion gap. He received 10 units of regular insulin overnight and 15 units of NOH this morning. He was briefly placed on IV insulin gtt and his anion gap closed. He reports good appetite. Thus, we will restart a prior known in-hospital regimen that worked.    Past Medical History:    Medical History:    Past Medical History:   Diagnosis Date   ??? Alcohol abuse    ??? 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 Bipolar disorder (CMS-HCC)    ??? Chronic pancreatitis (CMS-HCC)    ??? Chronic sinusitis    ??? 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 Cystic fibrosis (CMS-HCC)    ??? Diabetes mellitus (CMS-HCC)     dx in-house 2015   ??? Disease of thyroid gland    ??? Hypertension    ??? Kidney stones    ??? Portal hypertension (CMS-HCC)        Surgical History:    Past Surgical History:   Procedure Laterality Date   ??? CHOLECYSTECTOMY  2008   ??? sinus surgery     ??? TONSILLECTOMY       Allergies:  Patient has no known allergies.    All Medications:   Current Facility-Administered Medications   Medication Dose Route Frequency Provider Last Rate Last Dose   ??? acetaminophen (TYLENOL) tablet 500 mg  500 mg Oral Q6H PRN Azucena Freed, MD       ??? albuterol 2.5 mg /3 mL (0.083 %) nebulizer solution 2.5 mg  2.5 mg Nebulization 4x Daily (RT) Mackey Birchwood, MD   2.5 mg at 04/09/18 1515   ??? busPIRone (BUSPAR) tablet 15 mg  15 mg Oral TID Mackey Birchwood, MD   15 mg at 04/09/18 1341   ??? ceftaroline (TEFLARO) 600 mg in sodium chloride (NS) 0.9 % 50 mL IVPB  600 mg Intravenous Q12H Mackey Birchwood, MD 308 mL/hr at 04/09/18 1536 600 mg at 04/09/18 1536 ??? cholecalciferol (vitamin D3) tablet 7,500 Units  7,500 Units Oral Daily Mackey Birchwood, MD   7,500 Units at 04/09/18 1610   ??? ciprofloxacin HCl (CIPRO) tablet 750 mg  750 mg Oral Q12H [facility] Mackey Birchwood, MD   750 mg at 04/09/18 9604   ??? dextrose (D10W) 10% bolus 125 mL  12.5 g Intravenous Q30 Min PRN Chinelo C Okigbo, MD       ??? dextrose (D10W) 10% bolus 250 mL  250 mL Intravenous Once PRN Raynald Blend, MD       ??? dornase alfa (PULMOZYME) 1 mg/mL nebulizer solution 2.5 mg  2.5 mg Inhalation Daily (RT) Mackey Birchwood, MD   2.5 mg at 04/09/18 1059   ??? folic acid (FOLVITE) tablet 1 mg  1 mg Oral Daily Mackey Birchwood, MD   1 mg at 04/09/18 5409   ??? heparin (porcine) injection 5,000 Units  5,000 Units Subcutaneous [facility] Mackey Birchwood, MD   5,000 Units at 04/09/18 1341   ??? hydrocortisone 1 % cream   Topical Daily PRN Mackey Birchwood, MD       ??? insulin glargine (LANTUS) injection 30 Units  30 Units Subcutaneous Nightly Lovie Chol, MD   30 Units at 04/08/18 2101   ??? insulin lispro (HumaLOG) injection 0-10 Units  0-10 Units Subcutaneous ACHS Lovie Chol, MD   2 Units at 04/09/18 1206   ??? insulin lispro (HumaLOG) injection 16 Units  16 Units Subcutaneous TID AC Chinelo C Okigbo, MD   16 Units at 04/09/18 1629   ??? ketoconazole (NIZORAL) 2 % cream 1 application  1 application Topical Daily Mackey Birchwood, MD   1 application at 04/09/18 1207   ??? lactulose (CHRONULAC) oral solution (30 mL cup)  20 g Oral BID Mackey Birchwood, MD   20 g at 04/06/18 0833   ??? lidocaine (LIDODERM) 5 % patch 2 patch  2 patch Transdermal Q24H Mackey Birchwood, MD   Stopped at 04/07/18 0941   ??? lipase-protease-amylase (ZENPEP) 20,000-63,000- 84,000 unit capsule, delayed release 240,000 units of lipase  12 capsule Oral 3xd Meals Mackey Birchwood, MD   240,000 units of lipase at 04/09/18 1638   ??? magnesium oxide (MAG-OX) tablet 800 mg  800 mg Oral BID Azucena Freed, MD   800 mg at 04/09/18 8119   ??? melatonin tablet 3 mg  3 mg Oral QPM Mackey Birchwood, MD   3 mg at 04/08/18 2107   ??? meropenem (MERREM) 2 g in sodium chloride (NS) 0.9 % 100 mL IVPB  2 g Intravenous Q12H Azucena Freed, MD 150  mL/hr at 04/09/18 1627 2 g at 04/09/18 1627   ??? mirtazapine (REMERON) tablet 45 mg  45 mg Oral Nightly Mackey Birchwood, MD   45 mg at 04/08/18 2104   ??? MVW Complete (pediatric multivit 61-D3-vit K) 1,500-800 unit-mcg 2 capsule  2 capsule Oral Daily Mackey Birchwood, MD   2 capsule at 04/09/18 1610   ??? OLANZapine (ZYPREXA) tablet 15 mg  15 mg Oral Nightly Mackey Birchwood, MD   15 mg at 04/08/18 2104   ??? OLANZapine (ZYPREXA) tablet 2.5 mg  2.5 mg Oral BID PRN Azucena Freed, MD       ??? oxyCODONE (ROXICODONE) 5 MG immediate release tablet            ??? oxyCODONE (ROXICODONE) immediate release tablet 5 mg  5 mg Oral Q8H PRN Azucena Freed, MD       ??? pantoprazole (PROTONIX) EC tablet 40 mg  40 mg Oral BID with meals Mackey Birchwood, MD   40 mg at 04/09/18 9604   ??? phytonadione (vitamin K1) (MEPHYTON) tablet 5 mg  5 mg Oral Q W and Sa Mackey Birchwood, MD   5 mg at 04/09/18 5409   ??? polyethylene glycol (MIRALAX) packet 17 g  17 g Oral BID Mackey Birchwood, MD   17 g at 04/06/18 8119   ??? polyethylene glycol (MIRALAX) packet 17 g  17 g Oral TID PRN Mackey Birchwood, MD       ??? sodium chloride 7% nebulizer solution 4 mL  4 mL Nebulization 4x Daily (RT) Mackey Birchwood, MD   4 mL at 04/09/18 1516   ??? thiamine (B-1) tablet 200 mg  200 mg Oral [facility] Mackey Birchwood, MD   200 mg at 04/09/18 1341    Followed by   ??? [START ON 04/11/2018] thiamine (B-1) tablet 200 mg  200 mg Oral Daily Mackey Birchwood, MD       ??? traZODone (DESYREL) tablet 50 mg  50 mg Oral Nightly Mackey Birchwood, MD   50 mg at 04/08/18 2104       Social History:     Social History     Socioeconomic History   ??? Marital status: Single     Spouse name: None   ??? Number of children: 0   ??? Years of education: 12+   ??? Highest education level: Bachelor's degree (e.g., BA, AB, BS)   Occupational History   ??? Occupation: Restaurant manager, fast food     Comment: works at [employer]   ??? Financial resource strain: Very hard   ??? Food insecurity:     Worry: Often true     Inability: Often true   ??? Transportation needs:     Medical: No     Non-medical: No   Tobacco Use   ??? Smoking status: Former Smoker     Packs/day: 1.00     Years: 3.00     Pack years: 3.00     Types: Cigarettes   ??? Smokeless tobacco: Never Used   ??? Tobacco comment: reports quit prior to last admission   Substance and Sexual Activity   ??? Alcohol use: Yes     Alcohol/week: 0.0 standard drinks     Comment: A fifth of [place] per day    ??? Drug use: Not Currently     Comment: hx of alcohol and prescription pain medication abuse   ??? Sexual activity: Not Currently     Partners: Female   Lifestyle   ???  Physical activity:     Days per week: 0 days     Minutes per session: 0 min   ??? Stress: Very much   Relationships   ??? Social connections:     Talks on phone: Twice a week     Gets together: Once a week     Attends religious service: Never     Active member of club or organization: No     Attends meetings of clubs or organizations: Never     Relationship status: Never married   Other Topics Concern   ??? 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 Hillard Danker with [facility] in [place]    Current therapist: Nehemiah Settle with CF team        SUBSTANCE ABUSE HX:     # MJ     -Current use: once in awhile    # Alcohol     -Current use: Was sober from 2016 until two weeks ago. Admits to drinking enough to get drunk and admits to passing out. Denies history of complicated withdrawal. Admits to withdrawal symptoms of tremors, anxiety, headache, and tactile hallucinations at times.     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    Code Status:  Full Code    Review of Systems:  A 12 system review of systems was negative except as noted in HPI.    Objective:     BP 138/78  - Pulse 79  - Temp 37.4 ??C (Oral)  - Resp 16  - Ht 182.9 cm (6')  - Wt 76.2 kg (167 lb 14.4 oz)  - SpO2 93%  - BMI 22.77 kg/m??     Physical Exam:  Constitutional: alert, no acute distress  ENT: eyes closed, mucous membranes moist  CVS: RRR no MRG, S1S2 distinct  Resp: clear to auscultation, symmetric air entry  GI: soft, nontender, nondistended  MSK: no obvious joint deformity, feet-no obvious cuts, scabs, bruises, sensation intact  Neurological: awake, oriented to person, place, and time, normal speech.  Lymph: peripheral pulses normal, no pedal edema  Skin: normal coloration and turgor, no rashes  Psych: normal affect      Test Results    Data Review  Lab Results   Component Value Date    POCGLU 132 04/09/2018    POCGLU 164 04/09/2018    POCGLU 427 (HH) 04/09/2018    POCGLU 306 (H) 04/08/2018    POCGLU 174 04/08/2018    POCGLU 202 (H) 04/08/2018    POCGLU 150 04/08/2018       Lab Results   Component Value Date    A1C 13.3 (H) 04/06/2018    A1C 9.6 (H) 01/12/2018    A1C 11.9 (H) 11/17/2017     Lab Results   Component Value Date    GFR >= 60 10/31/2010    CREATININE 1.44 (H) 04/09/2018     Lab Results   Component Value Date    CHOL 116 01/29/2018     Lab Results   Component Value Date    HDL 61 (H) 01/29/2018     Lab Results   Component Value Date    LDL 35 (L) 01/29/2018     Lab Results   Component Value Date    TRIG 102 01/29/2018

## 2018-04-10 NOTE — Unmapped (Signed)
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  Walter [name] is a 37 y.o. 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, anxiety, bipolar disorder, prior suicide attempt, prior tobacco and alcohol use that presented to [facility] with Cystic fibrosis with pulmonary exacerbation and found to be hyperglycemic >1000.     CF related DM: A1C 10/19 13.3%, glucose is better controlled. Had elevated BS this morning 2/2 to missing dose of insulin yesterday. Will continue nightly Lantus and meal time and SSI. CTM and adjust as needed.   -endo recs appreciated    CF with acute pulmonary exacerbation:  Prior cultures with mucoid and smooth PsA, H flu, achromobacter. CF sputum cultures positive with mucoid PsA, achromobacter.  -airway clearance with vest, HTS, albuterol,pulmozyme  -repeat PFTs  - Continue meropenem/ceftaroline/cipro (10/22-11/5)  - PICC placement Monday with [facility]    Cirrhosis 2/2 CF related liver disease: no ascites or new swelling appreciated on physical exam. He has not experienced any change in swelling lately. Initial AST (37) ALT (21), elevated ALP to 353 and INR within normal limits. May need dose adjustments to antibiotics if persists.  - LFTs daily  - CTM, abdominal ultrasound if changes in abdominal symptoms  - HCC and variceal screening on discharge    AKI: likely has component of CKD at baseline with Cr fluctuating around 1.5.  -CTM  -encourage po intake  - Per pharmacy, no dose adjustment to meropenem at this time    Substance abuse: Concern for alcohol withdrawals requiring ativan and became drowsy. Holding sedating medications. Concern previous episode was exaggerated so plan to monitor with vitals.   - CTM closely  - MVI, thiamine, folate    Anxiety: struggles with anxiety and has a history of suicide attempt. We are continuing home meds which include Buspar and Trazodone. Psych consult with recs appreciated.   - holding gabapentin 2/2 asterixis and lethargy    CF related pancreatic insufficiency: Zen pep regimen, vitamin regimen, vitamin K twice a week.    Hypopigmented rash: pytriasis versicolor vs. Livedo reticularis appearance  -ketoconazole topical     LOS: 4 days     Subjective:    Remains hyperglycemic overnight. Pulled his IV out this morning. Otherwise, no acute events. Patient feels fine this morning, about the same as yesterday. Excited about the [facility] game last night.    Objective:    Vital signs in last 24 hours:  Temp:  [37.1 ??C-37.9 ??C] 37.1 ??C  Heart Rate:  [77-90] 83  Resp:  [16-20] 20  BP: (126-149)/(77-91) 126/77  MAP (mmHg):  [87-104] 87  SpO2:  [91 %-97 %] 91 %    Labs: Reviewed    Medications Changes:    Imaging:Reviewed    Physical Exam:  General Appearrance: NAD, chronically ill appearing lying comfortably in  bed  HEENT: EOMI, anicteric.   Heart: RRR, no murmurs rubs or gallops, normal S1 and S2.   Lungs: CTAB, no WOB, on room air, no focal sounds appreciated.  Abdomen: soft NT ND  Extremities: No pitting edema, no tenderness to palpation, no tremors.  Psych: Flat affect, normal speech.  Skin: hypopigmented rash on neck

## 2018-04-10 NOTE — Unmapped (Signed)
Patient Walter [name] 6. Patient received medications per [facility]. Patient much more awake than yesterday. Patient complained of loose stools to MD this morning; but a hat was placed in patient's bathroom and he did not have any more stools. Patient stated that he had not had anymore stools since conversing with MD. Patient denied pain. Will continue POC.    Problem: Adult Inpatient Plan of Care  Goal: Plan of Care Review  Outcome: Progressing  Goal: Patient-Specific Goal (Individualization)  Outcome: Progressing  Goal: Absence of Hospital-Acquired Illness or Injury  Outcome: Progressing  Intervention: Identify and Manage Fall Risk  Flowsheets (Taken 04/09/2018 0900 by Ria Bush, CNA)  Safety Interventions: low bed;isolation precautions;nonskid shoes/slippers when out of bed  Intervention: Prevent Skin Injury  Flowsheets (Taken 04/08/2018 2100 by Leander Rams, CNA)  Pressure Reduction Techniques: frequent weight shift encouraged  Intervention: Prevent VTE (venous thromboembolism)  Flowsheets (Taken 04/08/2018 2026)  VTE Prevention/Management: ambulation promoted  Intervention: Prevent Infection  Flowsheets (Taken 04/09/2018 0800)  Infection Prevention: single patient room provided;rest/sleep promoted  Goal: Optimal Comfort and Wellbeing  Outcome: Progressing  Intervention: Monitor Pain and Promote Comfort  Flowsheets (Taken 04/09/2018 1943)  Pain Management Interventions: care clustered  Intervention: Provide Person-Centered Care  Flowsheets (Taken 04/08/2018 0612 by Leda Min, RN)  Trust Relationship/Rapport: care explained;empathic listening provided;choices provided  Goal: Readiness for Transition of Care  Outcome: Progressing  Goal: Rounds/Family Conference  Outcome: Progressing     Problem: Infection  Goal: Infection Symptom Resolution  Outcome: Progressing     Problem: Infection (Cystic Fibrosis)  Goal: Absence of Infection Signs/Symptoms  Outcome: Progressing     Problem: Respiratory Compromise (Cystic Fibrosis)  Goal: Effective Oxygenation and Ventilation  Outcome: Progressing     Problem: Diabetes Comorbidity  Goal: Blood Glucose Level Within Desired Range  Outcome: Progressing

## 2018-04-10 NOTE — Unmapped (Signed)
Problem: Infection (Cystic Fibrosis)  Goal: Absence of Infection Signs/Symptoms  Outcome: Progressing     Problem: Respiratory Compromise (Cystic Fibrosis)  Goal: Effective Oxygenation and Ventilation  Outcome: Progressing   Pt refused some of the tx and airway clearance, pt resting

## 2018-04-10 NOTE — Unmapped (Addendum)
Endocrinology Consult Follow Up Note    Requesting Attending Physician :  Riccardo Dubin*  Service Requesting Consult : Pulmonology (MDG)  Primary Care Provider: Dellis Filbert, MD  Outpatient Endocrinologist: N/A    Assessment/Recommendations:    Walter [name] is a 37 y.o. 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 I have been asked to see in consultation at the request of Walter Hight MD for evaluation of CFRD.    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 Complicated by infection and noncompliance. Presented in DKA and now off IV insulin infusion. BG in the 400s today; patient reports eating overnight and not having insulin coverage with late night meals/snack. Recommendations are as follows:  -Continue Lantus 30 units qhs  -Continue Lispro 16 units but increase frequency to up to 5x per day to cover for late night meals  -Continue standard correction scale 2:50>150    The patient was seen and discussed with Dr. Naoma [name].    We will continue to follow patient as needed. Please call/page [phone] with questions or concerns.     Farrel Conners, MD  [facility] Endocrinology Fellow    Teaching physician attestation:  I saw and evaluated the patient, participating in the key portions of the service.?? I reviewed the resident???s note.?? I agree with the resident???s findings and plan.   ??  [name] continues to have highly variable blood sugars.He consumes a very high calorie diet in setting of CF. He is missing doses of insulin and eating fairly continuously. We discussed with him that it is best if he can let nursing know before eating so he can receive insulin. We increased meal dosing to up to 5 times daily given frequency of eating, including overnight.   ??  Leandra Kern, MD  ------------------------------------------------------------    History of Present Illness:     Reason for Consult: Hyperglycemia    Walter [name] is a 37 y.o. male admitted for Cystic fibrosis with pulmonary exacerbation (CMS-HCC). I have been asked to evaluate [facility] for CFRD. He has been started on IV antibiotics. He is not on any steroids for this exacerbation.     Interval history: BG in the 400s again this morning. Patient reports having a smoothie last night and did not ask for insulin coverage. He was encouraged to ask for his insulin. Spoke to [employer] as well.    History from initial encounter:  Patient is well known to the consult service from prior admissions. He was last seen by our team on 02/03/18 at which point he was receiving Lantus 30 units qhs, Lispro 14 units with meals and on a standard correction scale. His most recent A1c is 9.6% (01/12/18). Patient reports DM medication noncompliance as he was focusing on trying to control his CF. He acknowledged that he takes up to 18 units of lispro with meals and 30 units of lantus at home but is not consistent. He presented with BG >1000 and an elevated anion gap. He received 10 units of regular insulin overnight and 15 units of NOH this morning. He was briefly placed on IV insulin gtt and his anion gap closed. He reports good appetite. Thus, we will restart a prior known in-hospital regimen that worked.    Past Medical History:    Medical History:    Past Medical History:   Diagnosis Date   ??? Alcohol abuse    ??? 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  Bipolar disorder (CMS-HCC)    ??? Chronic pancreatitis (CMS-HCC)    ??? Chronic sinusitis    ??? 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 Cystic fibrosis (CMS-HCC)    ??? Diabetes mellitus (CMS-HCC)     dx in-house 2015   ??? Disease of thyroid gland    ??? Hypertension    ??? Kidney stones    ??? Portal hypertension (CMS-HCC)        Surgical History:    Past Surgical History:   Procedure Laterality Date   ??? CHOLECYSTECTOMY  2008   ??? sinus surgery     ??? TONSILLECTOMY       Allergies:  Patient has no known allergies.    All Medications:   Current Facility-Administered Medications Medication Dose Route Frequency Provider Last Rate Last Dose   ??? acetaminophen (TYLENOL) tablet 500 mg  500 mg Oral Q6H PRN Azucena Freed, MD       ??? albuterol 2.5 mg /3 mL (0.083 %) nebulizer solution 2.5 mg  2.5 mg Nebulization 4x Daily (RT) Mackey Birchwood, MD   2.5 mg at 04/10/18 1106   ??? busPIRone (BUSPAR) tablet 15 mg  15 mg Oral TID Mackey Birchwood, MD   15 mg at 04/10/18 1610   ??? ceftaroline (TEFLARO) 600 mg in sodium chloride (NS) 0.9 % 50 mL IVPB  600 mg Intravenous Q12H Mackey Birchwood, MD 308 mL/hr at 04/10/18 0200 600 mg at 04/10/18 0200   ??? cholecalciferol (vitamin D3) tablet 7,500 Units  7,500 Units Oral Daily Mackey Birchwood, MD   7,500 Units at 04/10/18 0827   ??? ciprofloxacin HCl (CIPRO) tablet 750 mg  750 mg Oral Q12H [facility] Mackey Birchwood, MD   750 mg at 04/10/18 0826   ??? dextrose (D10W) 10% bolus 125 mL  12.5 g Intravenous Q30 Min PRN Chinelo C Okigbo, MD       ??? dextrose (D10W) 10% bolus 250 mL  250 mL Intravenous Once PRN Raynald Blend, MD       ??? dextrose (D10W) 10% bolus 250 mL  25 g Intravenous Q30 Min PRN Wyatt Mage, MD       ??? dornase alfa (PULMOZYME) 1 mg/mL nebulizer solution 2.5 mg  2.5 mg Inhalation Daily (RT) Mackey Birchwood, MD   2.5 mg at 04/10/18 1106   ??? folic acid (FOLVITE) tablet 1 mg  1 mg Oral Daily Mackey Birchwood, MD   1 mg at 04/10/18 0827   ??? glucose chewable tablet 16 g  16 g Oral Q10 Min PRN Wyatt Mage, MD       ??? heparin (porcine) injection 5,000 Units  5,000 Units Subcutaneous [facility] Mackey Birchwood, MD   5,000 Units at 04/10/18 0537   ??? hydrocortisone 1 % cream   Topical Daily PRN Mackey Birchwood, MD       ??? insulin glargine (LANTUS) injection 30 Units  30 Units Subcutaneous Nightly Wyatt Mage, MD       ??? insulin lispro (HumaLOG) injection 0-12 Units  0-12 Units Subcutaneous ACHS Wyatt Mage, MD   12 Units at 04/10/18 0829   ??? insulin lispro (HumaLOG) injection 16 Units  16 Units Subcutaneous 5XD insulin Chinelo C Okigbo, MD       ??? ketoconazole (NIZORAL) 2 % cream 1 application  1 application Topical Daily Mackey Birchwood, MD   1 application at 04/10/18 0828   ??? lactulose (CHRONULAC) oral solution (30 mL cup)  20 g Oral BID Darliss Cheney  Nugent, MD       ??? lidocaine (LIDODERM) 5 % patch 2 patch  2 patch Transdermal Q24H Mackey Birchwood, MD   Stopped at 04/07/18 0941   ??? lipase-protease-amylase (ZENPEP) 20,000-63,000- 84,000 unit capsule, delayed release 240,000 units of lipase  12 capsule Oral 3xd Meals Mackey Birchwood, MD   240,000 units of lipase at 04/10/18 1146   ??? magnesium oxide (MAG-OX) tablet 800 mg  800 mg Oral BID Azucena Freed, MD   800 mg at 04/10/18 1147   ??? melatonin tablet 3 mg  3 mg Oral QPM Mackey Birchwood, MD   3 mg at 04/09/18 2008   ??? meropenem (MERREM) 2 g in sodium chloride (NS) 0.9 % 100 mL IVPB  2 g Intravenous Q12H Azucena Freed, MD 150 mL/hr at 04/10/18 0321 2 g at 04/10/18 0321   ??? mirtazapine (REMERON) tablet 45 mg  45 mg Oral Nightly Mackey Birchwood, MD   45 mg at 04/09/18 2008   ??? MVW Complete (pediatric multivit 61-D3-vit K) 1,500-800 unit-mcg 2 capsule  2 capsule Oral Daily Mackey Birchwood, MD   2 capsule at 04/10/18 1147   ??? OLANZapine (ZYPREXA) tablet 15 mg  15 mg Oral Nightly Mackey Birchwood, MD   15 mg at 04/09/18 2005   ??? OLANZapine (ZYPREXA) tablet 2.5 mg  2.5 mg Oral BID PRN Azucena Freed, MD       ??? oxyCODONE (ROXICODONE) 5 MG immediate release tablet            ??? oxyCODONE (ROXICODONE) immediate release tablet 5 mg  5 mg Oral Q8H PRN Azucena Freed, MD       ??? pantoprazole (PROTONIX) EC tablet 40 mg  40 mg Oral BID with meals Mackey Birchwood, MD   40 mg at 04/10/18 0981   ??? phytonadione (vitamin K1) (MEPHYTON) tablet 5 mg  5 mg Oral Q W and Sa Mackey Birchwood, MD   5 mg at 04/09/18 1914   ??? polyethylene glycol (MIRALAX) packet 17 g  17 g Oral BID Mackey Birchwood, MD   17 g at 04/06/18 7829   ??? polyethylene glycol (MIRALAX) packet 17 g  17 g Oral TID PRN Mackey Birchwood, MD       ??? sodium chloride 7% nebulizer solution 4 mL  4 mL Nebulization 4x Daily (RT) Mackey Birchwood, MD   4 mL at 04/10/18 1106   ??? thiamine (B-1) tablet 200 mg  200 mg Oral [facility] Mackey Birchwood, MD   200 mg at 04/10/18 0538    Followed by   ??? [START ON 04/11/2018] thiamine (B-1) tablet 200 mg  200 mg Oral Daily Mackey Birchwood, MD       ??? traZODone (DESYREL) tablet 50 mg  50 mg Oral Nightly Mackey Birchwood, MD   50 mg at 04/09/18 2008       Social History:     Social History     Socioeconomic History   ??? Marital status: Single     Spouse name: None   ??? Number of children: 0   ??? Years of education: 12+   ??? Highest education level: Bachelor's degree (e.g., BA, AB, BS)   Occupational History   ??? Occupation: Restaurant manager, fast food     Comment: works at [employer]   ??? Financial resource strain: Very hard   ??? Food insecurity:     Worry: Often true     Inability: Often true   ??? Transportation needs:  Medical: No     Non-medical: No   Tobacco Use   ??? Smoking status: Former Smoker     Packs/day: 1.00     Years: 3.00     Pack years: 3.00     Types: Cigarettes   ??? Smokeless tobacco: Never Used   ??? Tobacco comment: reports quit prior to last admission   Substance and Sexual Activity   ??? Alcohol use: Yes     Alcohol/week: 0.0 standard drinks     Comment: A fifth of [place] per day    ??? Drug use: Not Currently     Comment: hx of alcohol and prescription pain medication abuse   ??? Sexual activity: Not Currently     Partners: Female   Lifestyle   ??? Physical activity:     Days per week: 0 days     Minutes per session: 0 min   ??? Stress: Very much   Relationships   ??? Social connections:     Talks on phone: Twice a week     Gets together: Once a week     Attends religious service: Never     Active member of club or organization: No     Attends meetings of clubs or organizations: Never     Relationship status: Never married   Other Topics Concern   ??? 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 Hillard Danker with [facility] in [place]    Current therapist: Nehemiah Settle with CF team        SUBSTANCE ABUSE HX:     # MJ     -Current use: once in awhile    # Alcohol     -Current use: Was sober from 2016 until two weeks ago. Admits to drinking enough to get drunk and admits to passing out. Denies history of complicated withdrawal. Admits to withdrawal symptoms of tremors, anxiety, headache, and tactile hallucinations at times.     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    Code Status:  Full Code    Review of Systems:  A 12 system review of systems was negative except as noted in HPI.    Objective:     BP 126/77  - Pulse 80  - Temp 37.1 ??C (Oral)  - Resp 16  - Ht 182.9 cm (6')  - Wt 76.2 kg (167 lb 14.4 oz)  - SpO2 95%  - BMI 22.77 kg/m??     Physical Exam:  Constitutional: alert, no acute distress  ENT: eyes closed, mucous membranes moist  CVS: RRR no MRG, S1S2 distinct  Resp: clear to auscultation, symmetric air entry  GI: soft, nontender, nondistended  MSK: no obvious joint deformity, feet-no obvious cuts, scabs, bruises, sensation intact  Neurological: awake, oriented to person, place, and time, normal speech.  Lymph: peripheral pulses normal, no pedal edema  Skin: normal coloration and turgor, no rashes  Psych: normal affect      Test Results    Data Review  Lab Results   Component Value Date    POCGLU 110 04/10/2018    POCGLU 484 (HH) 04/10/2018    POCGLU 460 (HH) 04/09/2018    POCGLU 404 (HH) 04/09/2018    POCGLU 132 04/09/2018    POCGLU 164 04/09/2018    POCGLU 427 (HH) 04/09/2018       Lab Results   Component Value Date    A1C 13.3 (H) 04/06/2018    A1C 9.6 (H) 01/12/2018    A1C 11.9 (H) 11/17/2017     Lab Results   Component Value Date    GFR >= 60 10/31/2010    CREATININE 1.60 (H) 04/10/2018     Lab Results   Component Value Date    CHOL 116 01/29/2018     Lab Results   Component Value Date    HDL 61 (H) 01/29/2018     Lab Results   Component Value Date    LDL 35 (L) 01/29/2018     Lab Results   Component Value Date    TRIG 102 01/29/2018

## 2018-04-10 NOTE — Unmapped (Signed)
Patient is alert and oriented times 4. All medications given per order. Sliding scale insulin given for blood glucose control. Denies pain at present. Contact precaution maintained.        Problem: Adult Inpatient Plan of Care  Goal: Plan of Care Review  Outcome: Progressing  Goal: Patient-Specific Goal (Individualization)  Outcome: Progressing  Goal: Absence of Hospital-Acquired Illness or Injury  Outcome: Progressing  Goal: Optimal Comfort and Wellbeing  Outcome: Progressing  Goal: Readiness for Transition of Care  Outcome: Progressing  Goal: Rounds/Family Conference  Outcome: Progressing     Problem: Infection  Goal: Infection Symptom Resolution  Outcome: Progressing  Intervention: Prevent or Manage Infection  Flowsheets  Taken 04/09/2018 0800 by Johnsie Cancel, RN  Infection Management: aseptic technique maintained  Taken 04/09/2018 2350 by Karna Christmas, RN  Fever Reduction/Comfort Measures: medication administered  Isolation Precautions: contact precautions maintained     Problem: Infection (Cystic Fibrosis)  Goal: Absence of Infection Signs/Symptoms  Outcome: Progressing     Problem: Respiratory Compromise (Cystic Fibrosis)  Goal: Effective Oxygenation and Ventilation  Outcome: Progressing     Problem: Diabetes Comorbidity  Goal: Blood Glucose Level Within Desired Range  Outcome: Progressing

## 2018-04-10 NOTE — Unmapped (Signed)
Patient with BG of 484 this morning. RN asked Endocrine for insulin orders which will cover patient eating late at night. Patient updated that he now has insulin ordered to cover late night meals. Patient has been compliant for calling for insulin thus far this shift. Patient updated that his potassium was elevated this morning. Patient did take lactulose. Patient saved a stool sample to show his loose stools to staff. Stool was soft, but not watery. Patient has been asleep for majority of shift. Patient encouraged to ambulate in the hallway.     Problem: Adult Inpatient Plan of Care  Goal: Plan of Care Review  Outcome: Progressing  Goal: Patient-Specific Goal (Individualization)  Outcome: Progressing  Flowsheets (Taken 04/10/2018 1507)  Patient-Specific Goals (Include Timeframe): Patient will call RN for insulin for all meals/snacks by 10/31.  Goal: Absence of Hospital-Acquired Illness or Injury  Outcome: Progressing  Goal: Optimal Comfort and Wellbeing  Outcome: Progressing  Goal: Readiness for Transition of Care  Outcome: Progressing  Goal: Rounds/Family Conference  Outcome: Progressing     Problem: Infection  Goal: Infection Symptom Resolution  Outcome: Progressing     Problem: Infection (Cystic Fibrosis)  Goal: Absence of Infection Signs/Symptoms  Outcome: Progressing     Problem: Respiratory Compromise (Cystic Fibrosis)  Goal: Effective Oxygenation and Ventilation  Outcome: Progressing     Problem: Diabetes Comorbidity  Goal: Blood Glucose Level Within Desired Range  Outcome: Progressing

## 2018-04-11 DIAGNOSIS — R0602 Shortness of breath: Principal | ICD-10-CM

## 2018-04-11 LAB — COMPREHENSIVE METABOLIC PANEL
ALBUMIN: 2.9 g/dL — ABNORMAL LOW (ref 3.5–5.0)
ALKALINE PHOSPHATASE: 426 U/L — ABNORMAL HIGH (ref 38–126)
ALT (SGPT): 55 U/L (ref 19–72)
ANION GAP: 6 mmol/L — ABNORMAL LOW (ref 7–15)
AST (SGOT): 109 U/L — ABNORMAL HIGH (ref 19–55)
BILIRUBIN TOTAL: 0.3 mg/dL (ref 0.0–1.2)
BLOOD UREA NITROGEN: 21 mg/dL (ref 7–21)
BUN / CREAT RATIO: 13
CALCIUM: 8.3 mg/dL — ABNORMAL LOW (ref 8.5–10.2)
CHLORIDE: 101 mmol/L (ref 98–107)
CO2: 27 mmol/L (ref 22.0–30.0)
CREATININE: 1.63 mg/dL — ABNORMAL HIGH (ref 0.70–1.30)
EGFR CKD-EPI AA MALE: 62 mL/min/{1.73_m2} (ref >=60)
GLUCOSE RANDOM: 380 mg/dL — ABNORMAL HIGH (ref 65–179)
POTASSIUM: 5.9 mmol/L — ABNORMAL HIGH (ref 3.5–5.0)
PROTEIN TOTAL: 5.7 g/dL — ABNORMAL LOW (ref 6.5–8.3)
SODIUM: 134 mmol/L — ABNORMAL LOW (ref 135–145)

## 2018-04-11 LAB — CBC
HEMATOCRIT: 31 % — ABNORMAL LOW (ref 41.0–53.0)
HEMOGLOBIN: 9.8 g/dL — ABNORMAL LOW (ref 13.5–17.5)
MEAN CORPUSCULAR HEMOGLOBIN CONC: 31.6 g/dL (ref 31.0–37.0)
MEAN CORPUSCULAR HEMOGLOBIN: 28.6 pg (ref 26.0–34.0)
MEAN CORPUSCULAR VOLUME: 90.5 fL (ref 80.0–100.0)
MEAN PLATELET VOLUME: 7.7 fL (ref 7.0–10.0)
PLATELET COUNT: 155 10*9/L (ref 150–440)
RED BLOOD CELL COUNT: 3.43 10*12/L — ABNORMAL LOW (ref 4.50–5.90)
WBC ADJUSTED: 4.8 10*9/L (ref 4.5–11.0)

## 2018-04-11 LAB — HEPATIC FUNCTION PANEL
ALKALINE PHOSPHATASE: 424 U/L — ABNORMAL HIGH (ref 38–126)
AST (SGOT): 128 U/L — ABNORMAL HIGH (ref 19–55)
BILIRUBIN DIRECT: <0.1 mg/dL (ref 0.00–0.40)
BILIRUBIN TOTAL: 0.2 mg/dL (ref 0.0–1.2)
PROTEIN TOTAL: 5.8 g/dL — ABNORMAL LOW (ref 6.5–8.3)

## 2018-04-11 LAB — PLATELET COUNT: Lab: 155

## 2018-04-11 LAB — POTASSIUM
POTASSIUM: 6 mmol/L — ABNORMAL HIGH (ref 3.5–5.0)
Potassium:SCnc:Pt:Ser/Plas:Qn:: 4.7
Potassium:SCnc:Pt:Ser/Plas:Qn:: 4.8
Potassium:SCnc:Pt:Ser/Plas:Qn:: 5.1 — ABNORMAL HIGH
Potassium:SCnc:Pt:Ser/Plas:Qn:: 6 — ABNORMAL HIGH

## 2018-04-11 LAB — MAGNESIUM: Magnesium:MCnc:Pt:Ser/Plas:Qn:: 1.7

## 2018-04-11 LAB — PROTIME: Lab: 10.5

## 2018-04-11 LAB — BILIRUBIN TOTAL: Bilirubin:MCnc:Pt:Ser/Plas:Qn:: 0.3

## 2018-04-11 LAB — BILIRUBIN DIRECT: Bilirubin.glucuronidated:MCnc:Pt:Ser/Plas:Qn:: <0.1

## 2018-04-11 NOTE — Unmapped (Signed)
Endocrinology Consult Follow Up Note    Requesting Attending Physician :  Blair Dolphin, *  Service Requesting Consult : Pulmonology (MDG)  Primary Care Provider: Dellis Filbert, MD  Outpatient Endocrinologist: N/A    Assessment/Recommendations:    Walter [name] is a 37 y.o. 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 I have been asked to see in consultation at the request of [name] MD for evaluation of CFRD.    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 Complicated by infection and noncompliance. Presented in DKA and now off IV insulin infusion. Glycemic control difficult to achieve in the setting of late night eating/snacking and not asking for insulin coverage. Patient encouraged to ask for insulin prior to meals/snacks.   Recommendations are as follows:  -Increase to Lantus 34 units qhs  -Continue Lispro 16 units but increase frequency to up to 5x per day to cover for late night meals  -Continue standard correction scale 4:50>150    The patient was seen and discussed with Dr. Marcello Fennel.    We will continue to follow patient as needed. Please call/page [phone] with questions or concerns.     Farrel Conners, MD  [facility] Endocrinology Fellow    I saw and evaluated patient, participating in the critical and key portions of the service.  I discussed the findings, assessment, and plan with the resident/fellow, and agree with the resident/fellow's findings and plan as documented in the their note.  I was immediately available for the entirety of the of the history taking and examination, and present for key and critical portion thereof.    Thane Edu, MD, MPH  Attending - Endocrinology and Metabolism    ------------------------------------------------------------    History of Present Illness:     Reason for Consult: Hyperglycemia    Walter [name] is a 37 y.o. male admitted for Cystic fibrosis with pulmonary exacerbation (CMS-HCC). I have been asked to evaluate [facility] for CFRD. He has been started on IV antibiotics. He is not on any steroids for this exacerbation.     Interval history: Still having late night snacking without asking for insulin coverage.     History from initial encounter:  Patient is well known to the consult service from prior admissions. He was last seen by our team on 02/03/18 at which point he was receiving Lantus 30 units qhs, Lispro 14 units with meals and on a standard correction scale. His most recent A1c is 9.6% (01/12/18). Patient reports DM medication noncompliance as he was focusing on trying to control his CF. He acknowledged that he takes up to 18 units of lispro with meals and 30 units of lantus at home but is not consistent. He presented with BG >1000 and an elevated anion gap. He received 10 units of regular insulin overnight and 15 units of NOH this morning. He was briefly placed on IV insulin gtt and his anion gap closed. He reports good appetite. Thus, we will restart a prior known in-hospital regimen that worked.    Past Medical History:    Medical History:    Past Medical History:   Diagnosis Date   ??? Alcohol abuse    ??? 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 Bipolar disorder (CMS-HCC)    ??? Chronic pancreatitis (CMS-HCC)    ??? Chronic sinusitis    ??? 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 Cystic fibrosis (CMS-HCC)    ???  Diabetes mellitus (CMS-HCC)     dx in-house 2015   ??? Disease of thyroid gland    ??? Hypertension    ??? Kidney stones    ??? Portal hypertension (CMS-HCC)        Surgical History:    Past Surgical History:   Procedure Laterality Date   ??? CHOLECYSTECTOMY  2008   ??? sinus surgery     ??? TONSILLECTOMY       Allergies:  Patient has no known allergies.    All Medications:   Current Facility-Administered Medications   Medication Dose Route Frequency Provider Last Rate Last Dose   ??? acetaminophen (TYLENOL) tablet 500 mg  500 mg Oral Q6H PRN Azucena Freed, MD       ??? albuterol 2.5 mg /3 mL (0.083 %) nebulizer solution 2.5 mg  2.5 mg Nebulization 4x Daily (RT) Mackey Birchwood, MD   2.5 mg at 04/11/18 1114   ??? busPIRone (BUSPAR) tablet 15 mg  15 mg Oral TID Mackey Birchwood, MD   15 mg at 04/11/18 0981   ??? ceftaroline (TEFLARO) 600 mg in sodium chloride (NS) 0.9 % 50 mL IVPB  600 mg Intravenous Q12H Mackey Birchwood, MD 308 mL/hr at 04/11/18 0336 600 mg at 04/11/18 0336   ??? cholecalciferol (vitamin D3) tablet 7,500 Units  7,500 Units Oral Daily Mackey Birchwood, MD   7,500 Units at 04/11/18 1914   ??? ciprofloxacin HCl (CIPRO) tablet 750 mg  750 mg Oral Q12H [facility] Amit Ringel, MD   750 mg at 04/11/18 7829   ??? dextrose (D10W) 10% bolus 250 mL  25 g Intravenous Q30 Min PRN Wyatt Mage, MD       ??? dornase alfa (PULMOZYME) 1 mg/mL nebulizer solution 2.5 mg  2.5 mg Inhalation Daily (RT) Mackey Birchwood, MD   2.5 mg at 04/11/18 1114   ??? folic acid (FOLVITE) tablet 1 mg  1 mg Oral Daily Mackey Birchwood, MD   1 mg at 04/11/18 1031   ??? glucose chewable tablet 16 g  16 g Oral Q10 Min PRN Wyatt Mage, MD       ??? heparin (porcine) injection 5,000 Units  5,000 Units Subcutaneous [facility] Mackey Birchwood, MD   5,000 Units at 04/10/18 2025   ??? hydrocortisone 1 % cream   Topical Daily PRN Mackey Birchwood, MD       ??? insulin glargine (LANTUS) injection 34 Units  34 Units Subcutaneous Nightly Chinelo Philip Aspen, MD       ??? insulin lispro (HumaLOG) injection 0-24 Units  0-24 Units Subcutaneous ACHS Lovie Chol, MD       ??? insulin lispro (HumaLOG) injection 16 Units  16 Units Subcutaneous 5XD insulin Lovie Chol, MD   16 Units at 04/11/18 0923   ??? ketoconazole (NIZORAL) 2 % cream 1 application  1 application Topical Daily Mackey Birchwood, MD   1 application at 04/11/18 0925   ??? lactulose (CHRONULAC) oral solution (30 mL cup)  30 g Oral TID Wyatt Mage, MD   30 g at 04/11/18 0920   ??? lactulose (CHRONULAC) oral solution (30 mL cup)  20 g Oral Once PRN Wyatt Mage, MD       ??? lidocaine (LIDODERM) 5 % patch 2 patch  2 patch Transdermal Q24H Mackey Birchwood, MD   Stopped at 04/07/18 0941   ??? lipase-protease-amylase (ZENPEP) 20,000-63,000- 84,000 unit capsule, delayed release 240,000 units of lipase  12 capsule Oral 3xd Meals Mackey Birchwood, MD  240,000 units of lipase at 04/11/18 0924   ??? magnesium oxide (MAG-OX) tablet 800 mg  800 mg Oral BID Azucena Freed, MD   800 mg at 04/10/18 1726   ??? melatonin tablet 3 mg  3 mg Oral QPM Mackey Birchwood, MD   3 mg at 04/10/18 2022   ??? meropenem (MERREM) 2 g in sodium chloride (NS) 0.9 % 100 mL IVPB  2 g Intravenous Q12H Azucena Freed, MD 150 mL/hr at 04/11/18 0455 2 g at 04/11/18 0455   ??? mirtazapine (REMERON) tablet 45 mg  45 mg Oral Nightly Mackey Birchwood, MD   45 mg at 04/10/18 2022   ??? MVW Complete (pediatric multivit 61-D3-vit K) 1,500-800 unit-mcg 2 capsule  2 capsule Oral Daily Mackey Birchwood, MD   2 capsule at 04/10/18 1147   ??? OLANZapine (ZYPREXA) tablet 15 mg  15 mg Oral Nightly Mackey Birchwood, MD   15 mg at 04/10/18 2022   ??? OLANZapine (ZYPREXA) tablet 2.5 mg  2.5 mg Oral BID PRN Azucena Freed, MD       ??? oxyCODONE (ROXICODONE) 5 MG immediate release tablet            ??? oxyCODONE (ROXICODONE) immediate release tablet 5 mg  5 mg Oral Q8H PRN Azucena Freed, MD       ??? pantoprazole (PROTONIX) EC tablet 40 mg  40 mg Oral BID with meals Mackey Birchwood, MD   40 mg at 04/11/18 4401   ??? phytonadione (vitamin K1) (MEPHYTON) tablet 5 mg  5 mg Oral Q W and Sa Mackey Birchwood, MD   5 mg at 04/09/18 0272   ??? polyethylene glycol (MIRALAX) packet 17 g  17 g Oral BID Mackey Birchwood, MD   17 g at 04/06/18 5366   ??? polyethylene glycol (MIRALAX) packet 17 g  17 g Oral TID PRN Mackey Birchwood, MD       ??? sodium chloride 7% nebulizer solution 4 mL  4 mL Nebulization 4x Daily (RT) Mackey Birchwood, MD   4 mL at 04/11/18 1114   ??? sodium polystyrene sulfonate (SPS) oral suspension  15 g Oral Once Wyatt Mage, MD       ??? thiamine (B-1) tablet 200 mg  200 mg Oral Daily Mackey Birchwood, MD   200 mg at 04/11/18 4403   ??? traZODone (DESYREL) tablet 50 mg  50 mg Oral Nightly Mackey Birchwood, MD   50 mg at 04/10/18 2022       Social History:     Social History     Socioeconomic History   ??? Marital status: Single     Spouse name: None   ??? Number of children: 0   ??? Years of education: 12+   ??? Highest education level: Bachelor's degree (e.g., BA, AB, BS)   Occupational History   ??? Occupation: Restaurant manager, fast food     Comment: works at [employer]   ??? Financial resource strain: Very hard   ??? Food insecurity:     Worry: Often true     Inability: Often true   ??? Transportation needs:     Medical: No     Non-medical: No   Tobacco Use   ??? Smoking status: Former Smoker     Packs/day: 1.00     Years: 3.00     Pack years: 3.00     Types: Cigarettes   ??? Smokeless tobacco: Never Used   ??? Tobacco comment: reports quit prior to  last admission   Substance and Sexual Activity   ??? Alcohol use: Yes     Alcohol/week: 0.0 standard drinks     Comment: A fifth of [place] per day    ??? Drug use: Not Currently     Comment: hx of alcohol and prescription pain medication abuse   ??? Sexual activity: Not Currently     Partners: Female   Lifestyle   ??? Physical activity:     Days per week: 0 days     Minutes per session: 0 min   ??? Stress: Very much   Relationships   ??? Social connections:     Talks on phone: Twice a week     Gets together: Once a week     Attends religious service: Never     Active member of club or organization: No     Attends meetings of clubs or organizations: Never     Relationship status: Never married   Other Topics Concern   ??? 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 Hillard Danker with [facility] in [place]    Current therapist: Nehemiah Settle with CF team        SUBSTANCE ABUSE HX:     # MJ     -Current use: once in awhile    # Alcohol     -Current use: Was sober from 2016 until two weeks ago. Admits to drinking enough to get drunk and admits to passing out. Denies history of complicated withdrawal. Admits to withdrawal symptoms of tremors, anxiety, headache, and tactile hallucinations at times.     # Gabapentin misuse    --Current use - Admits to overusing his currently prescribed gabapentin (3600mg  at a time) to get a euphoric feeling        SOCIAL HX:     -Current living environment: Lives in a sober house in [place] with two roommates    -Relationship Status: Single    -Children: None, states he is infertile due to CF    -Education: Admits to obtaining a bachelor's degree from 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    Code Status:  Full Code    Review of Systems:  A 12 system review of systems was  negative except as noted in HPI.    Objective:     BP 150/89  - Pulse 79  - Temp 36.6 ??C (Oral)  - Resp 22  - Ht 182.9 cm (6')  - Wt 76.2 kg (167 lb 14.4 oz)  - SpO2 94%  - BMI 22.77 kg/m??     Physical Exam: Constitutional: alert, no acute distress  ENT: eyes closed, mucous membranes moist  CVS: RRR no MRG, S1S2 distinct  Resp: clear to auscultation, symmetric air entry  GI: soft, nontender, nondistended  MSK: no obvious joint deformity, feet-no obvious cuts, scabs, bruises, sensation intact  Neurological: awake, oriented to person, place, and time, normal speech.  Lymph: peripheral pulses normal, no pedal edema  Skin: normal coloration and turgor, no rashes  Psych: normal affect      Test Results    Data Review  Lab Results   Component Value Date    POCGLU 199 (H) 04/11/2018    POCGLU 349 (H) 04/11/2018    POCGLU 370 (H) 04/10/2018    POCGLU 207 (H) 04/10/2018    POCGLU 162 04/10/2018    POCGLU 110 04/10/2018    POCGLU 484 (HH) 04/10/2018       Lab Results   Component Value Date    A1C 13.3 (H) 04/06/2018    A1C 9.6 (H) 01/12/2018    A1C 11.9 (H) 11/17/2017     Lab Results   Component Value Date    GFR >= 60 10/31/2010    CREATININE 1.63 (H) 04/11/2018     Lab Results   Component Value Date    CHOL 116 01/29/2018     Lab Results   Component Value Date    HDL 61 (H) 01/29/2018     Lab Results   Component Value Date    LDL 35 (L) 01/29/2018     Lab Results   Component Value Date    TRIG 102 01/29/2018

## 2018-04-11 NOTE — Unmapped (Signed)
Diabetes education consultation: Consulted for assistance with providing instruction on diabetes self-management skills. Visit with Walter [name] at the bedside. Provided 30 minutes teaching diabetes education.     Assessment: Walter [name] with h/o CF, CFRD admitted for Cystic Fibrosis with pulmonary exacerbation. Walter [name] is currently taking LANTUS 34 units nightly, LISPRO Resistant SSI and LISPRO 16 units 5 times a day.    A1C:  Reviewed A1C and daily average blood sugar. Discussed how elevated blood sugars affect the body.   Lab Results   Component Value Date    A1C 13.3 (H) 04/06/2018       Insulin administration & safety: Discussed current regimen, explaining long-acting vs rapid acting action, timing of administration & described correctional sliding scale & connection to glucose monitoring. Reviewed verbally insulin administration with pens.  Daeton expressed he does not use a new needle with every new injection. Shared it is recommended he use a new needle if he is able. Walter [name] he felt confident with ability.    Instructed on insulin safety - including injection site selection & rotation, insulin storage, sharps disposal & discard/expiration date. Walter [name] verbalized when he opens a new insulin pen he keeps his insulin pen in use out of the refrigerator.   Requested that clinical nursing staff have Walter [name] inject insulin doses, so he can get practice with self-injection.  Also asked that they review correctional scale as well.     Problem-solving: Hypoglycemia:    Reveiwed hypoglycemia recognition & treatment, including 15-15 rule, listing several examples of appropriate fast carb sources, emphasizing importance of having something readily available. Described possible causes and prevention.  Instructed to contact provider for unexplained episode, an episode requiring more than 1 treatment, or 2 episodes within a 2-3 day period, for possible dose adjustment. Provided printed material: Hypoglycemia in Diabetes as a resource.    Risk Reduction- Foot Care:  Discussed principles of good foot care such as daily cleansing, and inspection, avoiding walking barefoot, having well fitting shoes and no bathroom surgery on feet.  Provided education material Diabetes Foot Health: Care Instructions as a resource.    Healthy Eating:  Discussed with patient meal planning principles for glucose control including the plate method, which foods are carbs and portion control. Reviewed avoiding sugary beverages/concentrated sweet. Reviewed dietary guidelines/ plate method/ avoiding concentrated sweets/eating at consistent times.     Diabetes Resources: Provided a copy of the plate, from the ADA Living with Type 2 Diabetes Where Do I Begin, Certified Diabetes Educator Referral A1C, [facility], Diabetes Care Checklist, and Diabetes: Sick Care, information as resources.      Insurance: Walter [name] has [employer]. There are not finanical barriers to glucose monitoring.     Monitoring: Walter [name] verbalized he has a working meter at home to check his blood sugars. He stated he does not record his blood sugars and does not check his blood sugars as often as he should. Provided with copies of large print log sheets to maintain a record of readings & instructed to bring to follow-up visits with provider for evaluation of treatment plan.      Recommendations: At f/u visit with PCP request referral for DSMES (Diabetes Self Management Education and Support) after discharge.     Plan:Please reconsult as necessary.   Thank You,   Remonia Richter, BSN, RN Diabetes Nurse Educator - pager: [phone]

## 2018-04-11 NOTE — Unmapped (Signed)
Meds given per [facility], although pt refused Miralax and 1 dose of Heparin.. VSS. SpO2 WNL on RA. No s/sx of respiratory distress noted. Potassium rechecked and was 5.1. No further interventions. No c/o pain. Pt was compliant with notifying nurse that meal tray was delivered and need for insulin. NPO at [facility] for possible line placement in VIR. POC continues     Problem: Adult Inpatient Plan of Care  Goal: Plan of Care Review  Outcome: Progressing  Flowsheets  Taken 04/11/2018 0622 by Julieta Gutting, RN  Progress: improving  Taken 04/08/2018 0612 by Leda Min, RN  Plan of Care Reviewed With: patient  Goal: Patient-Specific Goal (Individualization)  Outcome: Progressing  Flowsheets  Taken 04/10/2018 1507 by Velora Mediate, RN  Patient-Specific Goals (Include Timeframe): Patient will call RN for insulin for all meals/snacks by 10/31.  Taken 04/08/2018 0612 by Leda Min, RN  Individualized Care Needs: Patient prefers to be called [place]  Goal: Absence of Hospital-Acquired Illness or Injury  Outcome: Progressing  Intervention: Identify and Manage Fall Risk  Flowsheets (Taken 04/10/2018 2000)  Safety Interventions: isolation precautions;lighting adjusted for tasks/safety;low bed;nonskid shoes/slippers when out of bed  Intervention: Prevent Skin Injury  Flowsheets (Taken 04/10/2018 0800 by Velora Mediate, RN)  Pressure Reduction Techniques: frequent weight shift encouraged  Intervention: Prevent VTE (venous thromboembolism)  Flowsheets (Taken 04/10/2018 0834 by Velora Mediate, RN)  VTE Prevention/Management: ambulation promoted;fluids promoted  Intervention: Prevent Infection  Flowsheets (Taken 04/10/2018 0800 by Velora Mediate, RN)  Infection Prevention: handwashing promoted;rest/sleep promoted;single patient room provided  Goal: Optimal Comfort and Wellbeing  Outcome: Progressing  Intervention: Monitor Pain and Promote Comfort  Flowsheets (Taken 04/11/2018 1308)  Pain Management Interventions: care clustered; pain management plan reviewed with patient/caregiver; relaxation techniques promoted  Intervention: Provide Person-Centered Care  Flowsheets (Taken 04/08/2018 0612 by Leda Min, RN)  Trust Relationship/Rapport: care explained;empathic listening provided;choices provided  Goal: Readiness for Transition of Care  Outcome: Progressing  Goal: Rounds/Family Conference  Outcome: Progressing     Problem: Infection  Goal: Infection Symptom Resolution  Outcome: Progressing  Intervention: Prevent or Manage Infection  Flowsheets  Taken 04/09/2018 0800 by Johnsie Cancel, RN  Infection Management: aseptic technique maintained  Taken 04/09/2018 2350 by Karna Christmas, RN  Fever Reduction/Comfort Measures: medication administered  Taken 04/10/2018 2000 by Julieta Gutting, RN  Isolation Precautions: contact precautions maintained     Problem: Infection (Cystic Fibrosis)  Goal: Absence of Infection Signs/Symptoms  Outcome: Progressing  Intervention: Manage Infection and Prevent Transmission  Flowsheets  Taken 04/09/2018 0800 by Johnsie Cancel, RN  Infection Management: aseptic technique maintained  Taken 04/09/2018 2350 by Karna Christmas, RN  Fever Reduction/Comfort Measures: medication administered  Taken 04/10/2018 2000 by Julieta Gutting, RN  Isolation Precautions: contact precautions maintained     Problem: Respiratory Compromise (Cystic Fibrosis)  Goal: Effective Oxygenation and Ventilation  Outcome: Progressing     Problem: Diabetes Comorbidity  Goal: Blood Glucose Level Within Desired Range  Outcome: Progressing  Intervention: Maintain Glycemic Control  Flowsheets (Taken 04/10/2018 0800 by Velora Mediate, RN)  Glycemic Management: blood glucose monitoring;oral hydration promoted

## 2018-04-11 NOTE — Unmapped (Signed)
Patient did well with doing all of their treatments today, but refused all airway clearance.

## 2018-04-11 NOTE — Unmapped (Signed)
Daily Progress Note    24hr Events/Interval History:   Patient hyperkalemic this morning to 5.9 with EKG changes. Given insulin and calcium gluconate. Patient otherwise asymptomatic. Still struggling with airway clearance and insulin compliance overnight. Working towards PICC hopefully tomorrow with VIR. PFTs this morning with FEV1 of 29%pre. Discussed with patient the possibility of starting triple therapy combo in clinic, however cautioned him with importance of strict compliance and avoidance of alcohol with his liver function abnormalities.    Assessment/Plan:    Walter [name] is a 37 y.o. male who presented to [facility] with Cystic fibrosis with pulmonary exacerbation (CMS-HCC) and hyperglycemia without DKA.    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 *      CF with acute pulmonary exacerbation:  Prior cultures with mucoid and smooth PsA, H flu, achromobacter. CF sputum cultures positive with mucoid PsA, achromobacter. PFTs 10/28 with FEV%pre of 29. Discussed with patient possibility of starting triple therapy as outpatient ,however emphasized importance of compliance and avoidance of alcohol.  - airway clearance with vest, HTS, albuterol,pulmozyme  - Continue meropenem/ceftaroline/cipro (10/22-11/5)  - PICC placement with VIR 10/29    Hyperkalemia  Potassium trending upwards over last several days. Likely in setting of his diabetes, renal insufficiency, and high potassium diet. EKG this morning with concern for peaking T waves, given calcium gluconate. Will treat with kayexalate and lactulose today and monitor K q4h.  - Potassium checks q4h  - Kayexalate + lactulose  - Continue scheduled lactulose  - EKG, calcium gluconate as needed  - Will discuss with CF dietician about options for low potassium diet    CF related DM, hyperglycemia: A1C 10/19 13.3%, glucose is better controlled. Remains difficult to control due to compliance and late night snacking. Appreciate endo assistance.  -endo recs appreciated  ??  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 He has not experienced any change in swelling lately. Initial AST (37) ALT (21), elevated ALP to 353 and INR within normal limits. May need dose adjustments to antibiotics if persists.  - LFTs daily  - CTM, abdominal ultrasound if changes in abdominal symptoms  - HCC and variceal screening on discharge  ??  AKI: likely has component of CKD at baseline with Cr fluctuating around 1.5. Stable today.  -CTM  -encourage po intake  - Per pharmacy, no dose adjustment to meropenem at this time  ??  Substance abuse: Concern for alcohol withdrawals requiring ativan and became drowsy. Holding sedating medications. Concern previous episode was exaggerated so plan to monitor with vitals.   - CTM closely  - MVI, thiamine, folate  ??  Anxiety: struggles with anxiety and has a history of suicide attempt. We are continuing home meds which include Buspar and Trazodone. Psych consult with recs appreciated.   - holding gabapentin 2/2 asterixis and  lethargy  ??  CF related pancreatic insufficiency: Zen pep regimen, vitamin regimen, vitamin K twice a week.  ??    Daily checklist:   Diet: high protien, high calorie, will recommend low potassium  VTE ppx: heparin  GI ppx: PPI  Lytes: replete PRN to keep K>4 and Mag>2 IV Access: PIV  Code status: Full Code   Dispo: Likely inpatient for course of IV antibiotics  ___________________________________________________________________      Labs/Studies:  Labs and Studies from the last 24hrs per EMR and Reviewed                   Objective:  Temp:  [36.6 ??C-37.6 ??C] 36.6 ??C  Heart Rate:  [79-86] 79  Resp:  [16-22] 22  BP: (109-150)/(67-91) 150/89  SpO2:  [93 %-95 %] 94 %    GENERAL: Chronically ill appearing gentleman sitting up in bed, NAD  HEENT: EOMI  CV: RRR. Normal S1, S2. No murmurs, rubs, or gallops.  PULM: Clear to auscultation bilaterally, normal work of breathing.  ABD: +bowel sounds. Soft, non-distended, non-tender. No guarding or rebound.  EXT: No edema. Distal pulses intact.  NEURO: Grossly intact    Wyatt Mage, MD  Internal Medicine  PGY-1

## 2018-04-11 NOTE — Unmapped (Signed)
VASCULAR INTERVENTIONAL RADIOLOGY INPATIENT CVC CONSULTATION     Requesting Attending Physician: Blair Dolphin, *  Service Requesting Consult: Pulmonology (MDG)    Date of Service: 04/11/2018  Consulting Interventional Radiologist: Dr. Ammie Dalton     HPI:     Reason for consult: PICC     History of Present Illness:   Rosendo Couser is a 37 y.o. male with Cystic Fibrosis admitted with acute pulmonary exacerbation.     Review of Systems:  Pertinent items are noted in HPI.    Medical History:     Past Medical History:  Past Medical History:   Diagnosis Date   ??? Alcohol abuse    ??? 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 Bipolar disorder (CMS-HCC)    ??? Chronic pancreatitis (CMS-HCC)    ??? Chronic sinusitis    ??? 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 Cystic fibrosis (CMS-HCC)    ??? Diabetes mellitus (CMS-HCC)     dx in-house 2015   ??? Disease of thyroid gland    ??? Hypertension    ??? Kidney stones    ??? Portal hypertension (CMS-HCC)        Surgical History:  Past Surgical History:   Procedure Laterality Date   ??? CHOLECYSTECTOMY  2008   ??? sinus surgery     ??? TONSILLECTOMY         Family History:  Family History   Problem Relation Age of Onset   ??? Diabetes Maternal Grandmother    ??? Diabetes Maternal Grandfather    ??? No Known Problems Mother    ??? Cancer Father    ??? No Known Problems Sister        Medications:   Current Facility-Administered Medications   Medication Dose Route Frequency Provider Last Rate Last Dose   ??? acetaminophen (TYLENOL) tablet 500 mg  500 mg Oral Q6H PRN Azucena Freed, MD       ??? albuterol 2.5 mg /3 mL (0.083 %) nebulizer solution 2.5 mg  2.5 mg Nebulization 4x Daily (RT) Mackey Birchwood, MD   2.5 mg at 04/10/18 1528   ??? busPIRone (BUSPAR) tablet 15 mg  15 mg Oral TID Mackey Birchwood, MD   15 mg at 04/10/18 2022   ??? calcium gluconate 2 g in sodium chloride (NS) 0.9 % 100 mL IVPB  2 g Intravenous Once Wyatt Mage, MD       ??? ceftaroline (TEFLARO) 600 mg in sodium chloride (NS) 0.9 % 50 mL IVPB  600 mg Intravenous Q12H Mackey Birchwood, MD 308 mL/hr at 04/11/18 0336 600 mg at 04/11/18 0336   ??? cholecalciferol (vitamin D3) tablet 7,500 Units  7,500 Units Oral Daily Mackey Birchwood, MD   7,500 Units at 04/10/18 0827   ??? ciprofloxacin HCl (CIPRO) tablet 750 mg  750 mg Oral Q12H [facility] Mackey Birchwood, MD   750 mg at 04/10/18 2022   ??? dextrose (D10W) 10% bolus 125 mL  12.5 g Intravenous Once Sharyon Medicus, MD       ??? dextrose (D10W) 10% bolus 250 mL  25 g Intravenous Q30 Min PRN Wyatt Mage, MD       ??? dornase alfa (PULMOZYME) 1 mg/mL nebulizer solution 2.5 mg  2.5 mg Inhalation Daily (RT) Mackey Birchwood, MD   2.5 mg at 04/10/18 1106   ??? folic acid (FOLVITE) tablet 1 mg  1 mg Oral Daily Mackey Birchwood, MD   1 mg at 04/10/18  0827   ??? glucose chewable tablet 16 g  16 g Oral Q10 Min PRN Wyatt Mage, MD       ??? heparin (porcine) injection 5,000 Units  5,000 Units Subcutaneous [facility] Mackey Birchwood, MD   5,000 Units at 04/10/18 2025   ??? hydrocortisone 1 % cream   Topical Daily PRN Mackey Birchwood, MD       ??? insulin glargine (LANTUS) injection 34 Units  34 Units Subcutaneous Nightly Chinelo Philip Aspen, MD       ??? insulin lispro (HumaLOG) injection 0-24 Units  0-24 Units Subcutaneous ACHS Lovie Chol, MD       ??? insulin lispro (HumaLOG) injection 16 Units  16 Units Subcutaneous 5XD insulin Lovie Chol, MD   16 Units at 04/10/18 2023   ??? insulin regular (HumuLIN,NovoLIN) injection 10 Units  10 Units Intravenous Once Wyatt Mage, MD       ??? ketoconazole (NIZORAL) 2 % cream 1 application  1 application Topical Daily Mackey Birchwood, MD   1 application at 04/10/18 0828   ??? lactulose (CHRONULAC) oral solution (30 mL cup)  30 g Oral TID Wyatt Mage, MD       ??? lidocaine (LIDODERM) 5 % patch 2 patch  2 patch Transdermal Q24H Mackey Birchwood, MD   Stopped at 04/07/18 0941   ??? lipase-protease-amylase (ZENPEP) 20,000-63,000- 84,000 unit capsule, delayed release 240,000 units of lipase  12 capsule Oral 3xd Meals Mackey Birchwood, MD   240,000 units of lipase at 04/10/18 1720   ??? magnesium oxide (MAG-OX) tablet 800 mg  800 mg Oral BID Azucena Freed, MD   800 mg at 04/10/18 1726   ??? melatonin tablet 3 mg  3 mg Oral QPM Mackey Birchwood, MD   3 mg at 04/10/18 2022   ??? meropenem (MERREM) 2 g in sodium chloride (NS) 0.9 % 100 mL IVPB  2 g Intravenous Q12H Azucena Freed, MD 150 mL/hr at 04/11/18 0455 2 g at 04/11/18 0455   ??? mirtazapine (REMERON) tablet 45 mg  45 mg Oral Nightly Mackey Birchwood, MD   45 mg at 04/10/18 2022   ??? MVW Complete (pediatric multivit 61-D3-vit K) 1,500-800 unit-mcg 2 capsule  2 capsule Oral Daily Mackey Birchwood, MD   2 capsule at 04/10/18 1147   ??? OLANZapine (ZYPREXA) tablet 15 mg  15 mg Oral Nightly Mackey Birchwood, MD   15 mg at 04/10/18 2022   ??? OLANZapine (ZYPREXA) tablet 2.5 mg  2.5 mg Oral BID PRN Azucena Freed, MD       ??? oxyCODONE (ROXICODONE) 5 MG immediate release tablet            ??? oxyCODONE (ROXICODONE) immediate release tablet 5 mg  5 mg Oral Q8H PRN Azucena Freed, MD       ??? pantoprazole (PROTONIX) EC tablet 40 mg  40 mg Oral BID with meals Mackey Birchwood, MD   40 mg at 04/10/18 1726   ??? phytonadione (vitamin K1) (MEPHYTON) tablet 5 mg  5 mg Oral Q W and Sa Mackey Birchwood, MD   5 mg at 04/09/18 4540   ??? polyethylene glycol (MIRALAX) packet 17 g  17 g Oral BID Mackey Birchwood, MD   17 g at 04/06/18 9811   ??? polyethylene glycol (MIRALAX) packet 17 g  17 g Oral TID PRN Mackey Birchwood, MD       ??? sodium chloride 7% nebulizer solution 4 mL  4 mL Nebulization 4x  Daily (RT) Mackey Birchwood, MD   4 mL at 04/10/18 1529   ??? thiamine (B-1) tablet 200 mg  200 mg Oral Daily Mackey Birchwood, MD       ??? traZODone (DESYREL) tablet 50 mg  50 mg Oral Nightly Mackey Birchwood, MD   50 mg at 04/10/18 2022       Allergies:  Patient has no known allergies.    Social History:  Social History     Tobacco Use   ??? Smoking status: Former Smoker     Packs/day: 1.00     Years: 3.00     Pack years: 3.00     Types: Cigarettes   ??? Smokeless tobacco: Never Used   ??? Tobacco comment: reports quit prior to last admission   Substance Use Topics   ??? Alcohol use: Yes     Alcohol/week: 0.0 standard drinks     Comment: A fifth of [place] per day    ??? Drug use: Not Currently     Comment: hx of alcohol and prescription pain medication abuse       Objective:      Vital Signs:  Temp:  [36.6 ??C (97.9 ??F)-37.6 ??C (99.7 ??F)] 36.6 ??C (97.9 ??F)  Heart Rate:  [79-86] 79  Resp:  [16-22] 22  BP: (109-150)/(67-91) 150/89  MAP (mmHg):  [75-103] 103  FiO2 (%):  [21 %] 21 %  SpO2:  [93 %-95 %] 94 %    Physical Exam:  Physical Exam:    Vitals:    04/11/18 0623   BP: 150/89   Pulse: 79   Resp: 22   Temp: 36.6 ??C (97.9 ??F)   SpO2: 94%     ASA Grade: ASA 2 - Patient with mild systemic disease with no functional limitations  General: No apparent distress.  Lungs: Breathing comfortably on room air.  Heart:  Regular rate and rhythm.   Neuro: No obvious focal deficits.    Airway assessment: Class 2 - Can visualize soft palate and fauces, tip of uvula is obscured    Diagnostic Studies:  None Relavent    Labs:    Recent Labs     04/10/18  0553 04/11/18  0603   WBC 5.5 4.8   HGB 10.1* 9.8*   HCT 31.2* 31.0*   PLT 137* 155     Recent Labs     04/09/18  0527 04/10/18  0553 04/10/18  1546 04/10/18  2207 04/11/18  0603   NA 137 135  --   --  134*   K 4.7 5.6* 5.3* 5.1* 5.9*   CL 102 98  --   --  101   BUN 14 17  --   --  21   CREATININE 1.44* 1.60*  --   --  1.63*   GLU 372* 399*  --   --  380*     Recent Labs     04/10/18  0553 04/10/18  2207 04/11/18  0603   PROT 5.6* 5.8* 5.7*   ALBUMIN 2.8* 2.9* 2.9*   AST 59* 128* 109*   ALT 27 51 55   ALKPHOS 375* 424* 426*   BILITOT 0.2 0.2 0.3     Recent Labs     04/09/18  0527 04/10/18  0553 04/11/18  0602   INR 0.92 0.92 0.91       Blood Cultures Pending:  No.  Does Anticoagulation need to be held:  No.    Assessment and Recommendations:  Mr. Cherry is a 37 y.o. male with CF exacerbation who requires abx until 11/5.    Recommendations:  - Left SL PICC placement given stenotic right axillary vein s/p angioplasty. Pt adamant that no angioplasty to be performed on Left side. Non tunneled TLC to be placed if stenosis present.   - Anticipated procedure date: 04/12/18 pending emergencies; if new IV access needed sooner recommend ultrasound guided IV with VAT  - Please ensure recent CBC, Creatinine, and INR are available    Informed Consent:  This procedure has been fully reviewed with the patient/patient???s authorized representative. The risks, benefits and alternatives have been explained, and the patient/patient???s authorized representative has consented to the procedure.  --The patient will accept blood products in an emergent situation.  --The patient does not have a Do Not Resuscitate order in effect.    The patient was discussed with  Dr. Ammie Dalton.     Thank you for involving [place] in the care of this patient. Please page the VIR consult pager [phone]) with further questions, concerns, or if new issues arise.    Suzy Bouchard MMSc, PA-C  Interventional Radiology  04/11/2018 11:22 AM

## 2018-04-11 NOTE — Unmapped (Signed)
[facility]      [facility]     Walter [name] is a 37 y.o. male followed by [facility] for his CF. CF SW met with Walter [name] this morning as he is currently hospitalized on 6BT for treatment of a pulmonary exacerbation. Walter [name] was in positive spirits this morning. He did share about his recent job loss, but reports that he left on good terms (contract up and employer unable to offer him a full-time job).  Walter [name] would like to look for part-time work and shared that his prior boss will give him a good reference. Walter [name] also requested assistance applying for disability income. SW reminded him of CF Legal support as long as he has been able to maintain three months sobriety. Walter [name] plans to reach out to [facility] [employer] soon. He also reports plan to come to [facility] clinic after speaking with Dr. Lurena Nida about trikafta. Walter [name] expressed hopefulness for improvement in overall health. He is still disinterested in counseling support, but states that he is continuing to see his psychiatrist as scheduled and take his psychotropic medication. SW noted Psychiatry consult this admission with med changes (restart Gabapentin and add Zyprexa BID PRN) as well as Endocrine involvement for poorly controlled diabetes.      As part of [facility] CF [employer], SW provided a $50 gift card for food due to financial difficulties and associated food insecurity.    Walter [name] declined need for groceries from [facility] clinic food pantry, and reports getting rent support this month through [place]. Denies need for other financial assistance at this time. His insurance is through the [facility] [employer] and is not employer based.    Jerl Mina, LCSW  April 11, 2018 1:09 PM

## 2018-04-11 NOTE — Unmapped (Signed)
Patient refused all treatments today.

## 2018-04-12 DIAGNOSIS — R0602 Shortness of breath: Principal | ICD-10-CM

## 2018-04-12 LAB — COMPREHENSIVE METABOLIC PANEL
ALBUMIN: 3.3 g/dL — ABNORMAL LOW (ref 3.5–5.0)
ALKALINE PHOSPHATASE: 582 U/L — ABNORMAL HIGH (ref 38–126)
ANION GAP: 8 mmol/L (ref 7–15)
AST (SGOT): 152 U/L — ABNORMAL HIGH (ref 19–55)
BILIRUBIN TOTAL: 0.4 mg/dL (ref 0.0–1.2)
BLOOD UREA NITROGEN: 18 mg/dL (ref 7–21)
BUN / CREAT RATIO: 12
CALCIUM: 9 mg/dL (ref 8.5–10.2)
CHLORIDE: 104 mmol/L (ref 98–107)
CO2: 27 mmol/L (ref 22.0–30.0)
CREATININE: 1.5 mg/dL — ABNORMAL HIGH (ref 0.70–1.30)
EGFR CKD-EPI NON-AA MALE: 59 mL/min/{1.73_m2} — ABNORMAL LOW (ref >=60)
GLUCOSE RANDOM: 79 mg/dL (ref 65–179)
POTASSIUM: 5.1 mmol/L — ABNORMAL HIGH (ref 3.5–5.0)
PROTEIN TOTAL: 6.5 g/dL (ref 6.5–8.3)
SODIUM: 139 mmol/L (ref 135–145)

## 2018-04-12 LAB — INR: Lab: 0.94

## 2018-04-12 LAB — POTASSIUM: Potassium:SCnc:Pt:Ser/Plas:Qn:: 4.8

## 2018-04-12 LAB — MAGNESIUM: Magnesium:MCnc:Pt:Ser/Plas:Qn:: 1.7

## 2018-04-12 LAB — CBC
HEMATOCRIT: 35.2 % — ABNORMAL LOW (ref 41.0–53.0)
HEMOGLOBIN: 11.3 g/dL — ABNORMAL LOW (ref 13.5–17.5)
MEAN CORPUSCULAR HEMOGLOBIN CONC: 32.2 g/dL (ref 31.0–37.0)
MEAN CORPUSCULAR HEMOGLOBIN: 28.8 pg (ref 26.0–34.0)
PLATELET COUNT: 144 10*9/L — ABNORMAL LOW (ref 150–440)
RED BLOOD CELL COUNT: 3.94 10*12/L — ABNORMAL LOW (ref 4.50–5.90)
RED CELL DISTRIBUTION WIDTH: 14.4 % (ref 12.0–15.0)
WBC ADJUSTED: 6.5 10*9/L (ref 4.5–11.0)

## 2018-04-12 LAB — PROTIME-INR: PROTIME: 10.8 s (ref 10.2–13.1)

## 2018-04-12 LAB — CORTISOL TOTAL: Cortisol:MCnc:Pt:Ser/Plas:Qn:: 14.1

## 2018-04-12 LAB — PLATELET COUNT: Lab: 144 — ABNORMAL LOW

## 2018-04-12 LAB — PROTEIN TOTAL: Protein:MCnc:Pt:Ser/Plas:Qn:: 6.5

## 2018-04-12 NOTE — Unmapped (Signed)
Patient has been compliant with inhaled treatments but refusing airway clearance this shift. Patient also refused to allow me to auscultate while assessing. Will continue to monitor.

## 2018-04-12 NOTE — Unmapped (Signed)
Endocrinology Consult Follow Up Note    Requesting Attending Physician :  Blair Dolphin, *  Service Requesting Consult : Pulmonology (MDG)  Primary Care Provider: Dellis Filbert, MD  Outpatient Endocrinologist: N/A    Assessment/Recommendations:    Walter [name] is a 37 y.o. 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 I have been asked to see in consultation at the request of [name] MD for evaluation of CFRD.    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 Complicated by infection and noncompliance. Presented in DKA and now off IV insulin infusion. Has had variable glucoses due to late night eating/snacking and not asking for insulin coverage. Patient encouraged to ask for insulin prior to meals/snacks. Glycemic control is better today.  Recommendations are as follows:  -Continue Lantus 34 units qhs  -Continue Lispro 16 units with meals up to 5x per day to cover for late night meals  -Continue standard correction scale 2:50>150    The patient was seen and discussed with Dr. Marcello Fennel.    We will continue to follow patient as needed. Please call/page [phone] with questions or concerns.     Farrel Conners, MD  [name] Endocrinology Fellow    I saw and evaluated patient, participating in the critical and key portions of the service.  I discussed the findings, assessment, and plan with the resident/fellow, and agree with the resident/fellow's findings and plan as documented in the their note.  I was immediately available for the entirety of the of the history taking and examination, and present for key and critical portion thereof.    Thane Edu, MD, MPH  Attending - Endocrinology and Metabolism    ------------------------------------------------------------    History of Present Illness:     Reason for Consult: Hyperglycemia    Walter [name] is a 38 y.o. male admitted for Cystic fibrosis with pulmonary exacerbation (CMS-HCC). I have been asked to evaluate Walter [name] for CFRD. He has been started on IV antibiotics. He is not on any steroids for this exacerbation.     Interval history: Patient denies any symptoms this morning. He was NPO overnight for a PICC line this morning and his glycemic control was better. This further supports that the glycemic control variabilities noted so far in the [name] is due to his late night eating.    History from initial encounter:  Patient is well known to the consult service from prior admissions. He was last seen by our team on 02/03/18 at which point he was receiving Lantus 30 units qhs, Lispro 14 units with meals and on a standard correction scale. His most recent A1c is 9.6% (01/12/18). Patient reports DM medication noncompliance as he was focusing on trying to control his CF. He acknowledged that he takes up to 18 units of lispro with meals and 30 units of lantus at home but is not consistent. He presented with BG >1000 and an elevated anion gap. He received 10 units of regular insulin overnight and 15 units of NOH this morning. He was briefly placed on IV insulin gtt and his anion gap closed. He reports Walter appetite. Thus, we will restart a prior known in-[name] regimen that worked.    Past Medical History:    Medical History:    Past Medical History:   Diagnosis Date   ??? Alcohol abuse    ??? 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 Bipolar disorder (CMS-HCC)    ???  Chronic pancreatitis (CMS-HCC)    ??? Chronic sinusitis    ??? 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 Cystic fibrosis (CMS-HCC)    ??? Diabetes mellitus (CMS-HCC)     dx in-house 2015   ??? Disease of thyroid gland    ??? Hypertension    ??? Kidney stones    ??? Portal hypertension (CMS-HCC)        Surgical History:    Past Surgical History:   Procedure Laterality Date   ??? CHOLECYSTECTOMY  2008   ??? sinus surgery     ??? TONSILLECTOMY       Allergies:  Patient has no known allergies.    All Medications:   Current Facility-Administered Medications   Medication Dose Route Frequency Provider Last Rate Last Dose   ??? acetaminophen (TYLENOL) tablet 500 mg  500 mg Oral Q6H PRN Azucena Freed, MD       ??? albuterol 2.5 mg /3 mL (0.083 %) nebulizer solution 2.5 mg  2.5 mg Nebulization 4x Daily (RT) Mackey Birchwood, MD   2.5 mg at 04/11/18 1515   ??? busPIRone (BUSPAR) tablet 15 mg  15 mg Oral TID Mackey Birchwood, MD   15 mg at 04/12/18 1032   ??? ceftaroline (TEFLARO) 600 mg in sodium chloride (NS) 0.9 % 50 mL IVPB  600 mg Intravenous Q12H Mackey Birchwood, MD 308 mL/hr at 04/12/18 0641 600 mg at 04/12/18 0641   ??? cholecalciferol (vitamin D3) tablet 7,500 Units  7,500 Units Oral Daily Mackey Birchwood, MD   7,500 Units at 04/12/18 1032   ??? ciprofloxacin HCl (CIPRO) tablet 750 mg  750 mg Oral Q12H [facility] Amit Ringel, MD   750 mg at 04/12/18 1031   ??? dextrose (D10W) 10% bolus 250 mL  25 g Intravenous Q30 Min PRN Wyatt Mage, MD       ??? dornase alfa (PULMOZYME) 1 mg/mL nebulizer solution 2.5 mg  2.5 mg Inhalation Daily (RT) Mackey Birchwood, MD   2.5 mg at 04/11/18 1114   ??? folic acid (FOLVITE) tablet 1 mg  1 mg Oral Daily Mackey Birchwood, MD   1 mg at 04/12/18 1032   ??? heparin (porcine) injection 5,000 Units  5,000 Units Subcutaneous [facility] Mackey Birchwood, MD   5,000 Units at 04/10/18 2025   ??? hydrocortisone 1 % cream   Topical Daily PRN Mackey Birchwood, MD       ??? insulin glargine (LANTUS) injection 34 Units  34 Units Subcutaneous Nightly Lovie Chol, MD   34 Units at 04/11/18 2057   ??? insulin lispro (HumaLOG) injection 0-12 Units  0-12 Units Subcutaneous ACHS Wyatt Mage, MD   4 Units at 04/11/18 2056   ??? insulin lispro (HumaLOG) injection 16 Units  16 Units Subcutaneous 5XD insulin Lovie Chol, MD   16 Units at 04/12/18 1048   ??? ketoconazole (NIZORAL) 2 % cream 1 application  1 application Topical Daily Mackey Birchwood, MD   1 application at 04/11/18 0925   ??? lactulose (CHRONULAC) oral solution (30 mL cup)  30 g Oral TID Wyatt Mage, MD   30 g at 04/12/18 1030   ??? lactulose (CHRONULAC) oral solution (30 mL cup)  20 g Oral Once PRN Wyatt Mage, MD       ??? lidocaine (LIDODERM) 5 % patch 2 patch  2 patch Transdermal Q24H Mackey Birchwood, MD   Stopped at 04/07/18 0941   ??? lipase-protease-amylase (ZENPEP) 20,000-63,000- 84,000 unit capsule, delayed release  240,000 units of lipase  12 capsule Oral 3xd Meals Mackey Birchwood, MD   240,000 units of lipase at 04/12/18 1035   ??? magnesium oxide (MAG-OX) tablet 800 mg  800 mg Oral BID Azucena Freed, MD   800 mg at 04/11/18 1740   ??? melatonin tablet 3 mg  3 mg Oral QPM Mackey Birchwood, MD   3 mg at 04/11/18 2052   ??? meropenem (MERREM) 2 g in sodium chloride (NS) 0.9 % 100 mL IVPB  2 g Intravenous Q12H Azucena Freed, MD 150 mL/hr at 04/12/18 0302 2 g at 04/12/18 0302   ??? mirtazapine (REMERON) tablet 45 mg  45 mg Oral Nightly Mackey Birchwood, MD   45 mg at 04/11/18 2052   ??? MVW Complete (pediatric multivit 61-D3-vit K) 1,500-800 unit-mcg 2 capsule  2 capsule Oral Daily Mackey Birchwood, MD   2 capsule at 04/11/18 1142   ??? OLANZapine (ZYPREXA) tablet 15 mg  15 mg Oral Nightly Mackey Birchwood, MD   15 mg at 04/11/18 2051   ??? OLANZapine (ZYPREXA) tablet 2.5 mg  2.5 mg Oral BID PRN Azucena Freed, MD       ??? oxyCODONE (ROXICODONE) 5 MG immediate release tablet            ??? oxyCODONE (ROXICODONE) immediate release tablet 5 mg  5 mg Oral Q8H PRN Azucena Freed, MD       ??? pantoprazole (PROTONIX) EC tablet 40 mg  40 mg Oral BID with meals Mackey Birchwood, MD   40 mg at 04/12/18 1031   ??? phytonadione (vitamin K1) (MEPHYTON) tablet 5 mg  5 mg Oral Q W and Sa Mackey Birchwood, MD   5 mg at 04/09/18 1027   ??? polyethylene glycol (MIRALAX) packet 17 g  17 g Oral BID Mackey Birchwood, MD   17 g at 04/06/18 2536   ??? polyethylene glycol (MIRALAX) packet 17 g  17 g Oral TID PRN Mackey Birchwood, MD       ??? sodium chloride 7% nebulizer solution 4 mL  4 mL Nebulization 4x Daily (RT) Mackey Birchwood, MD   4 mL at 04/11/18 1515   ??? thiamine (B-1) tablet 200 mg  200 mg Oral Daily Mackey Birchwood, MD 200 mg at 04/12/18 1031   ??? traZODone (DESYREL) tablet 50 mg  50 mg Oral Nightly Mackey Birchwood, MD   50 mg at 04/11/18 2052       Social History:     Social History     Socioeconomic History   ??? Marital status: Single     Spouse name: None   ??? Number of children: 0   ??? Years of education: 12+   ??? Highest education level: Bachelor's degree (e.g., BA, AB, BS)   Occupational History   ??? Occupation: Restaurant manager, fast food     Comment: works at [employer]   ??? Financial resource strain: Very hard   ??? Food insecurity:     Worry: Often true     Inability: Often true   ??? Transportation needs:     Medical: No     Non-medical: No   Tobacco Use   ??? Smoking status: Former Smoker     Packs/day: 1.00     Years: 3.00     Pack years: 3.00     Types: Cigarettes   ??? Smokeless tobacco: Never Used   ??? Tobacco comment: reports quit prior to last admission   Substance and Sexual Activity   ???  Alcohol use: Yes     Alcohol/week: 0.0 standard drinks     Comment: A fifth of [place] per day    ??? Drug use: Not Currently     Comment: hx of alcohol and prescription pain medication abuse   ??? Sexual activity: Not Currently     Partners: Female   Lifestyle   ??? Physical activity:     Days per week: 0 days     Minutes per session: 0 min   ??? Stress: Very much   Relationships   ??? Social connections:     Talks on phone: Twice a week     Gets together: Once a week     Attends religious service: Never     Active member of club or organization: No     Attends meetings of clubs or organizations: Never     Relationship status: Never married   Other Topics Concern   ??? 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 Hillard Danker with [facility] in [place]    Current therapist: Nehemiah Settle with CF team        SUBSTANCE ABUSE HX:     # MJ     -Current use: once in awhile    # Alcohol     -Current use: Was sober from 2016 until two weeks ago. Admits to drinking enough to get drunk and admits to passing out. Denies history of complicated withdrawal. Admits to withdrawal symptoms of tremors, anxiety, headache, and tactile hallucinations at times.     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    Code Status:  Full Code    Review of Systems:  A 12 system review of systems was negative except as noted in HPI.    Objective:  BP 109/59  - Pulse 69  - Temp 36.6 ??C (Oral)  - Resp 13  - Ht 182.9 cm (6')  - Wt 76.2 kg (167 lb 14.4 oz)  - SpO2 94% - BMI 22.77 kg/m??     Physical Exam:  Constitutional: alert, no acute distress  ENT: eyes closed, mucous membranes moist  CVS: RRR no MRG, S1S2 distinct  Resp: clear to auscultation, symmetric air entry  GI: soft, nontender, nondistended  MSK: no obvious joint deformity, feet-no obvious cuts, scabs, bruises, sensation intact  Neurological: awake, oriented to person, place, and time, normal speech.  Lymph: peripheral pulses normal, no pedal edema  Skin: normal coloration and turgor, no rashes  Psych: normal affect      Test Results    Data Review  Lab Results   Component Value Date    POCGLU 136 04/12/2018    POCGLU 74 04/12/2018    POCGLU 78 04/12/2018    POCGLU 230 (H) 04/11/2018    POCGLU 311 (H) 04/11/2018    POCGLU 288 (H) 04/11/2018    POCGLU 199 (H) 04/11/2018       Lab Results   Component Value Date    A1C 13.3 (H) 04/06/2018    A1C 9.6 (H) 01/12/2018    A1C 11.9 (H) 11/17/2017     Lab Results   Component Value Date    GFR >= 60 10/31/2010    CREATININE 1.50 (H) 04/12/2018     Lab Results   Component Value Date    CHOL 116 01/29/2018     Lab Results   Component Value Date    HDL 61 (H) 01/29/2018     Lab Results   Component Value Date    LDL 35 (L) 01/29/2018     Lab Results   Component Value Date    TRIG 102 01/29/2018

## 2018-04-12 NOTE — Unmapped (Signed)
Daily Progress Note    24hr Events/Interval History:   Patient hyperkalemic yesterday that downtrended, this morning K of 5.1 and has been NPO for PICC line this morning to continue IV Abx. He will need to stay in hospital to complete appropriate course due to difficulties at home that prevent him from completing his full antibiotic course.    Assessment/Plan:    Walter [name] is a 37 y.o. male who presented to [facility] with Cystic fibrosis with pulmonary exacerbation (CMS-HCC) and hyperglycemia without DKA.    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 *      CF with acute pulmonary exacerbation:  Prior cultures with mucoid and smooth PsA, H flu, achromobacter. CF sputum cultures positive with mucoid PsA, achromobacter. Mucoid PsA (S:Aztreonam, Ceftaz, Cipro, Zosyn, Tobra, I:Levo, R:Amikacin). Continue current antibiotics as they are sensitive and will await any changes until more sensitivities result. PFTs 10/28 with FEV%pre of 29. Discussed with patient possibility of starting triple therapy as outpatient , however emphasized importance of compliance and avoidance of alcohol.  - airway clearance with vest, HTS, albuterol,pulmozyme  - Continue meropenem/ceftaroline/cipro (10/22-11/5)   - PICC line for continued IV Abx to be completed in house as he has multiple life obstacles that prevent him from getting adequate care outside the hospital.    Hyperkalemia  Potassium trending upwards over last several days. Likely in setting of his diabetes, renal insufficiency, and high potassium diet. This morning elevated to 5.1, will repeat K check and start low K diet.   - AM Cortisol    CF related DM, hyperglycemia: A1C 10/19 13.3%, glucose is better controlled. Remains difficult to control due to compliance and late night snacking. Appreciate endo assistance.  -endo recs appreciated  ??  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 He has not experienced any change in swelling lately. Liver enzymes uptrending. He is being treated with Meropenem. Will continue to monitor and may need Meropenem dose adjustment.   - LFTs daily  - CTM, abdominal ultrasound if changes in abdominal symptoms  - HCC and variceal screening on discharge  ??  AKI: likely has component of CKD at baseline with Cr fluctuating around 1.5. Stable today.  -CTM  - encourage po intake  - Per pharmacy, no dose adjustment to meropenem at this time  ??  Substance abuse: No recent concern for withdrawal. Holding sedating medications.   - CTM closely  - MVI, thiamine, folate  ??  Anxiety: struggles with anxiety and has a history of suicide attempt. We are continuing home meds which include Buspar and Trazodone. Psych consult with recs appreciated.   - holding gabapentin 2/2 asterixis and lethargy  ??  CF related pancreatic insufficiency: Zen pep regimen, vitamin regimen, vitamin K twice a week.  ??    Daily checklist:  Diet: high protien, high calorie, will recommend low potassium  VTE ppx: heparin  GI ppx: PPI  Lytes: replete PRN to keep K>4 and Mag>2  IV Access: PIV  Code status: Full Code   Dispo: Likely inpatient for course of IV antibiotics  ___________________________________________________________________      Labs/Studies:  Labs and Studies from the last 24hrs per EMR and Reviewed Objective:  Temp:  [36.6 ??C-37.2 ??C] 36.6 ??C  Heart Rate:  [68-86] 69  Resp:  [11-20] 13  BP: (104-138)/(59-93) 109/59  SpO2:  [93 %-97 %] 94 %    GENERAL: Chronically ill appearing gentleman sitting up in bed, NAD, eating breakfast  HEENT: EOMI  PULM: normal work of breathing.  EXT: LUE PICC line intact  NEURO: Grossly intact    Azucena Freed, MD  Internal Medicine  PGY-1

## 2018-04-12 NOTE — Unmapped (Signed)
[facility] Interventional Radiology  Post-procedure Note    Patient: Walter [name]    DOB: July 28, 1980  Medical Record Number: [id number]   Note Date/Time: April 12, 2018 9:23 AM     Procedure: PICC line- single lumen     Diagnosis:  IV Antibotics    Attending: Dr. Maree Erie     Fellow/Resident: Dr. Barrett Shell. Erin Sons    Time out: Prior to the procedure, a time out was performed with all team members present.  During the time out, the patient, procedure and procedure site when applicable were verbally verified by the team members including Dr. Liam Rogers.      Anesthesia:  Conscious Sedation    Complications: NONE    Estimated Blood Loss:  Minimal     Specimens:  None     Major Findings:  Successful placement of catheter in the Right Brachial Vein with its tip at the Cavoatrial Junction    Plan: Catheter ready for use    The patient tolerated the procedure well without incident or complication.     See detailed procedure note with images in PACS.    L.C. Erin Sons, MD  April 12, 2018 9:23 AM

## 2018-04-12 NOTE — Unmapped (Signed)
Pt A&O x 4. Pt independent. Pt ambulated off the unit. Pt reported having 2 BMs during this shift. PFT's completed. Meds given per [facility], pt refused miralax. VS WNL. SpO2 WNL on RA. Pt did not complain of any pain this shift. Pt compliant with care. Pt place on 3gm K+ restricted diet and pt educated on new diet order. Pt has not had any falls or injuries this shift. Pt will be NPO at midnight for VIR procedure. POC continues.      Problem: Adult Inpatient Plan of Care  Goal: Plan of Care Review  Outcome: Progressing  Goal: Patient-Specific Goal (Individualization)  Outcome: Progressing  Goal: Absence of Hospital-Acquired Illness or Injury  Outcome: Progressing  Goal: Optimal Comfort and Wellbeing  Outcome: Progressing  Goal: Readiness for Transition of Care  Outcome: Progressing  Goal: Rounds/Family Conference  Outcome: Progressing     Problem: Infection  Goal: Infection Symptom Resolution  Outcome: Progressing     Problem: Infection (Cystic Fibrosis)  Goal: Absence of Infection Signs/Symptoms  Outcome: Progressing     Problem: Respiratory Compromise (Cystic Fibrosis)  Goal: Effective Oxygenation and Ventilation  Outcome: Progressing     Problem: Diabetes Comorbidity  Goal: Blood Glucose Level Within Desired Range  Outcome: Progressing

## 2018-04-12 NOTE — Unmapped (Signed)
Cystic Fibrosis Nutrition Assessment    Inpatient: MD Consult this admission and related follow up   Primary Pulmonary Provider: Dr. Lurena Nida  ==================================================================  Walter [name] is a 37 y.o. 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 Currently admitted with elevated glucose levels (1190 on presentation), was on IV insulin infusion (now off). Also treated for CF exacerbation with IV antibiotics.   - Endocrinology following.   - Upon RD visit today, Ervan sleeping soundly.  ===================================================================  INTERVENTION:  1. RD to continue to follow up with [name] re: elevated K+ this admit. Continue current diet. - High Calorie High Protein with potassium restriction.  - Note during previous admit Roxy had elevated K+ levels. During that admit RD provided verbal/written education re: potassium content of foods available on [facility] menu as well as education for home.  - If warranted could consider starting PO supplement - 1 Ensure Plus daily to help meet increased nutritional needs but with lower potassium & carbohydrate content than SuperShakes.  (1 Ensure Plus provides 350 kcals, 13 grams protein, 50 grams carbohydrate, 400 mg potassium where as 1 SuperShake provides ~ 630 kcals, 14 grams protein, 81 grams carbohydrate, 729 mg potassium)    2. [name] home enzyme regimen Zenpep 40,000 is not available on inpatient drug formulary, typically substitute with Zenpep 20,000 during admissions.  - Recommend decrease Zenpep 20,000 to  (9 at meals, 5 at snacks) provides 2,545 units lipase/kg/meal & 11,881 units lipase/kg/day.    3. Continue CF vitamin regimen:  - MVW Complete Formulation capsules 2 daily - during admission  - vitamin D3/cholecalciferol 7,500 units daily - during admission    4. PT WNL on admit - agree with 5mg  phytonadione twice weekly only during admission.    5. Weigh patient twice weekly this admit. 6. Continue remainder of nutrition regimen:  - acid reducer    - insulin regimen per MD team  - bowel regimen   - remeron    Inpatient:   Will follow up with patient per protocol: 1-2 times per week (and more frequent as indicated)  ===================================================================  ASSESSMENT:  Cystic Fibrosis Nutrition Category = Urgent Need     Current diet is appropriate for CF. Current PO intake is not adequate to meet estimated CF needs. Patient would benefit from adjustment in enzyme regimen. Vitamin prescription is appropriate to reach/maintain optimal fat soluble vitamin levels. Patient may benefit from vitamin K supplementation while on IV antibiotics. Bowel regimen is appropriate. Acid reducer appropriate for GERD and enzyme activation. Insulin as appropriate per Endocrinology for glucose management.. Suspect weight changes this year could be multifactorial - poor glucose management (HGB A1Cs have ranged from 8.4% to 11.9% since November 2018) , variable PO intake, increase needs related to pulmonary status (FEV1 35% in April 2019, noting occasional need for supplemental oxygen).    Malnutrition Assessment using AND/ASPEN Clinical Characteristics:  Deferred NFPE.     Goals:  1. Meet estimated daily needs: 3314 kcals while inpatient considering lower activity factor, recommend increase to 4141 kcals when outpatient considering higher activity factor; 84-112 gm pro; 2514 mL free water      Calories estimated using: Cystic Fibrosis Conference Formula, fluid per Holiday Segar, protein per DRI x 1.5-2  2. Reach/maintain established goals for CF:                Adult - BMI 22kg/m2 for CF females and 23kg/m2 for CF males    Pediatric - BMI or wt/ht ratio > 50%ile  for age  55. Normal fat-soluble vitamin levels: Vitamin A, Vitamin E and PT per lab range; Vitamin D 25OH total >30  4. Maintain glucose control. Carbohydrate content of diet should comprise 40-50% of total calorie needs, but carbohydrates are not restricted in this population.    5. Meet sodium needs for CF  ===================================================================  INPATIENT:  Current Nutrition Orders (inpatient):       Nutrition Orders   (From admission, onward)             Start     Ordered    04/12/18 1322  Nutrition Therapy High Calorie High Protein; Potassium Restricted (2 gm K)  Effective now     Question Answer Comment   Nutrition Therapy (T): High Calorie High Protein    Electrolytes (T): Potassium Restricted (2 gm K)        04/12/18 1322              Nutrition Hx:  lanuts, lispro; home Zenpep 40,000 (5 at meals, 3 at snacks - provides 2828 units lipase/kg/meal & 42595 units lipase/kg/day) substitute while inpatient with Zenpep 20,000, did not tolerate Creon;  2 MVW complete formulation D30000 chewables daily; miralax, protonix, ursodisol; Hx of drinking chocolate Ensure Plus during admits; remeron for appetite. Hx Alcohol Abuse noted.    CF Nutrition related medications (inpatient): Nutritionally relevant medications reviewed.   7500 International units vitamin D3 daily  Folic acid  Thiamine   lantus  HumaLOG  Lactulose TID & PRN  Zenpep 20,000 (12 at meals)  Magnesium oxide  Remeron 45 mg nightly  2 MVW Complete Formulation capsules daily  protonix  5 mg phytonadione twice weekly  miralax 17 grams BID  thiamine  miralax PRN    CF Nutrition related labs (inpatient):   04-12-18: K+ 5.1 - elevated, Na+ WNL, Glu 79, PT WNL  04-11-18: K+ ranged from 4.7 to 6 - elevated, Glu 380 - elevated  Recent Labs   Lab Units 04/12/18  1045 04/12/18  0734 04/12/18  0624 04/11/18  2056 04/11/18  1705   POC GLUCOSE mg/dL 638 74 78 756* 433*   29-51-88: Na+/K+ WNL, Glu 102, PT WNL  04-06-18: Glu 1,190  - elevated, Na+ 123 - low, K+ 5.6 - elevated, CRP 53 - elevated, PT 12 - WNL  ==================================================================  CLINICAL DATA:  Past Medical History:   Diagnosis Date   ??? Alcohol abuse    ??? 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 Bipolar disorder (CMS-HCC)    ??? Chronic pancreatitis (CMS-HCC)    ??? Chronic sinusitis    ??? 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 Cystic fibrosis (CMS-HCC)    ??? Diabetes mellitus (CMS-HCC)     dx in-house 2015   ??? Disease of thyroid gland    ??? Hypertension    ??? Kidney stones    ??? Portal hypertension (CMS-HCC)      Anthroprometric Evaluation:  Weight changes: Weight is up since 10-14.    Last 5 Recorded Weights    04/07/18 2125   Weight: 76.2 kg (167 lb 14.4 oz)     BMI Readings from Last 3 Encounters:   04/07/18 22.77 kg/m??   03/28/18 20.56 kg/m??   02/28/18 21.93 kg/m??     Wt Readings from Last 12 Encounters:   04/07/18 76.2 kg (167 lb 14.4 oz)   03/28/18 70.7 kg (155 lb 12.8 oz)   02/28/18 75.4 kg (166 lb 3.2 oz)   02/01/18  76.2 kg (167 lb 15.9 oz)   01/05/18 67.9 kg (149 lb 9.6 oz)   11/21/17 65.4 kg (144 lb 2.9 oz)   09/29/17 67.6 kg (149 lb)   09/07/17 70.4 kg (155 lb 4.8 oz)   07/30/17 72.6 kg (160 lb)   07/11/17 80.9 kg (178 lb 4.8 oz)   06/30/17 74.8 kg (165 lb)   06/20/17 75.2 kg (165 lb 12.6 oz)     Ht Readings from Last 3 Encounters:   04/07/18 182.9 cm (6')   03/28/18 185.4 cm (6' 0.99)   02/28/18 185.4 cm (6' 0.99)   ==================================================================  Fat-soluble vitamin levels: low related to chronic non-compliance  Lab Results   Component Value Date/Time    VITAMINA 22.4 (L) 03/19/2016 1529     Lab Results   Component Value Date/Time    VITDTOTAL 23.2 01/29/2018 2048    VITDTOTAL 20.7 11/17/2017 1054    VITDTOTAL 9.5 (L) 04/20/2017 0531    VITDTOTAL 23.1 02/13/2017 0558    VITDTOTAL 8.8 (L) 02/02/2017 0534    VITDTOTAL 20 03/19/2016 1529     Lab Results   Component Value Date/Time    VITAME 4.5 (L) 03/19/2016 1529     Lab Results   Component Value Date/Time    PT 10.8 04/12/2018 0605    PT 10.5 04/11/2018 0602    PT 10.6 04/10/2018 0553    PT 10.6 04/09/2018 0527    PT 11.9 04/08/2018 0558    PT 11.1 10/31/2010 1115     Bone Health: Abnormal vitamin D level, being treated. 2011 DEXA showed normal bone density.    CF Related Diabetes: Yes. Prescribed insulin regimen, however note non-compliance.  Lab Results   Component Value Date/Time    A1C 13.3 (H) 04/06/2018 0117    A1C 8.8 (H) 11/25/2015 1426    A1C 9.3 (H) 04/16/2014 1604

## 2018-04-12 NOTE — Unmapped (Signed)
Patient admitted and using IP supply of Pulmozyme.  Moved call out based on EDD and will follow-up with patient in 10 days.     Baker Janus  [facility]   [phone] opt 4

## 2018-04-12 NOTE — Unmapped (Addendum)
Meds given per MAR. VSS. SpO2 WNL on RA. No s/sx of respiratory distress noted. Potassium rechecked and was 4.8. No further interventions. Although pt vocalized frustration with potassium restricted diet. No c/o pain. NPO at MN for line placement in VIR. POC continues     Problem: Adult Inpatient Plan of Care  Goal: Plan of Care Review  Outcome: Progressing  Flowsheets  Taken 04/12/2018 0526 by Julieta Gutting, RN  Progress: improving  Taken 04/08/2018 0612 by Leda Min, RN  Plan of Care Reviewed With: patient  Goal: Patient-Specific Goal (Individualization)  Outcome: Progressing  Flowsheets  Taken 04/10/2018 1507 by Velora Mediate, RN  Patient-Specific Goals (Include Timeframe): Patient will call RN for insulin for all meals/snacks by 10/31.  Taken 04/08/2018 0612 by Leda Min, RN  Individualized Care Needs: Patient prefers to be called Walter [name]  Goal: Absence of Hospital-Acquired Illness or Injury  Outcome: Progressing  Intervention: Identify and Manage Fall Risk  Flowsheets (Taken 04/12/2018 0526)  Safety Interventions: isolation precautions; lighting adjusted for tasks/safety; low bed; nonskid shoes/slippers when out of bed  Intervention: Prevent Skin Injury  Flowsheets (Taken 04/11/2018 0900 by Adolm Joseph, CNA)  Pressure Reduction Techniques: frequent weight shift encouraged  Intervention: Prevent VTE (venous thromboembolism)  Flowsheets (Taken 04/10/2018 0834 by Velora Mediate, RN)  VTE Prevention/Management: ambulation promoted;fluids promoted  Intervention: Prevent Infection  Flowsheets (Taken 04/11/2018 0800 by Kandice Robinsons, RN)  Infection Prevention: handwashing promoted;personal protective equipment utilized;rest/sleep promoted;single patient room provided  Goal: Optimal Comfort and Wellbeing  Outcome: Progressing  Intervention: Monitor Pain and Promote Comfort  Flowsheets (Taken 04/11/2018 4540)  Pain Management Interventions: care clustered;pain management plan reviewed with patient/caregiver;relaxation techniques promoted  Intervention: Provide Person-Centered Care  Flowsheets (Taken 04/08/2018 0612 by Leda Min, RN)  Trust Relationship/Rapport: care explained;empathic listening provided;choices provided  Goal: Readiness for Transition of Care  Outcome: Progressing  Goal: Rounds/Family Conference  Outcome: Progressing     Problem: Infection  Goal: Infection Symptom Resolution  Outcome: Progressing  Intervention: Prevent or Manage Infection  Flowsheets  Taken 04/11/2018 0800 by Kandice Robinsons, RN  Infection Management: aseptic technique maintained  Taken 04/09/2018 2350 by Karna Christmas, RN  Fever Reduction/Comfort Measures: medication administered  Taken 04/11/2018 2100 by Bevelyn Buckles, CNA  Isolation Precautions: contact precautions maintained     Problem: Infection (Cystic Fibrosis)  Goal: Absence of Infection Signs/Symptoms  Outcome: Progressing  Intervention: Manage Infection and Prevent Transmission  Flowsheets  Taken 04/11/2018 0800 by Kandice Robinsons, RN  Infection Management: aseptic technique maintained  Taken 04/09/2018 2350 by Karna Christmas, RN  Fever Reduction/Comfort Measures: medication administered  Taken 04/11/2018 2100 by Bevelyn Buckles, CNA  Isolation Precautions: contact precautions maintained     Problem: Respiratory Compromise (Cystic Fibrosis)  Goal: Effective Oxygenation and Ventilation  Outcome: Progressing  Intervention: Optimize Oxygenation and Ventilation  Flowsheets (Taken 04/11/2018 2000)  Head of Bed [facility]): HOB elevated     Problem: Diabetes Comorbidity  Goal: Blood Glucose Level Within Desired Range  Outcome: Progressing  Intervention: Maintain Glycemic Control  Flowsheets (Taken 04/12/2018 0526)  Glycemic Management: blood glucose monitoring; supplemental insulin given

## 2018-04-12 NOTE — Unmapped (Signed)
Procedure complete. Dressing applied to left PICC. Report called to floor RN. Pt returned to stretcher and transport request placed.

## 2018-04-13 LAB — CORTISOL TOTAL
Cortisol:MCnc:Pt:Ser/Plas:Qn:: 18.7
Cortisol:MCnc:Pt:Ser/Plas:Qn:: 22.8
Cortisol:MCnc:Pt:Ser/Plas:Qn:: 5.3

## 2018-04-13 LAB — COMPREHENSIVE METABOLIC PANEL
ALBUMIN: 3.1 g/dL — ABNORMAL LOW (ref 3.5–5.0)
ALKALINE PHOSPHATASE: 583 U/L — ABNORMAL HIGH (ref 38–126)
ALT (SGPT): 77 U/L — ABNORMAL HIGH (ref 19–72)
ANION GAP: 7 mmol/L (ref 7–15)
AST (SGOT): 96 U/L — ABNORMAL HIGH (ref 19–55)
BILIRUBIN TOTAL: 0.3 mg/dL (ref 0.0–1.2)
BLOOD UREA NITROGEN: 21 mg/dL (ref 7–21)
BUN / CREAT RATIO: 14
CALCIUM: 8.4 mg/dL — ABNORMAL LOW (ref 8.5–10.2)
CHLORIDE: 98 mmol/L (ref 98–107)
CO2: 28 mmol/L (ref 22.0–30.0)
CREATININE: 1.55 mg/dL — ABNORMAL HIGH (ref 0.70–1.30)
EGFR CKD-EPI AA MALE: 66 mL/min/{1.73_m2} (ref >=60)
EGFR CKD-EPI NON-AA MALE: 57 mL/min/{1.73_m2} — ABNORMAL LOW (ref >=60)
GLUCOSE RANDOM: 384 mg/dL — ABNORMAL HIGH (ref 65–179)
POTASSIUM: 6.1 mmol/L (ref 3.5–5.0)

## 2018-04-13 LAB — CBC
HEMATOCRIT: 32.1 % — ABNORMAL LOW (ref 41.0–53.0)
HEMOGLOBIN: 10 g/dL — ABNORMAL LOW (ref 13.5–17.5)
MEAN CORPUSCULAR HEMOGLOBIN CONC: 31.1 g/dL (ref 31.0–37.0)
MEAN PLATELET VOLUME: 8.4 fL (ref 7.0–10.0)
PLATELET COUNT: 138 10*9/L — ABNORMAL LOW (ref 150–440)
RED BLOOD CELL COUNT: 3.48 10*12/L — ABNORMAL LOW (ref 4.50–5.90)
RED CELL DISTRIBUTION WIDTH: 14.1 % (ref 12.0–15.0)
WBC ADJUSTED: 6.9 10*9/L (ref 4.5–11.0)

## 2018-04-13 LAB — BASIC METABOLIC PANEL
ANION GAP: 9 mmol/L (ref 7–15)
BUN / CREAT RATIO: 14
CALCIUM: 8.3 mg/dL — ABNORMAL LOW (ref 8.5–10.2)
CO2: 28 mmol/L (ref 22.0–30.0)
CREATININE: 1.56 mg/dL — ABNORMAL HIGH (ref 0.70–1.30)
EGFR CKD-EPI AA MALE: 66 mL/min/{1.73_m2} (ref >=60)
EGFR CKD-EPI NON-AA MALE: 57 mL/min/{1.73_m2} — ABNORMAL LOW (ref >=60)
GLUCOSE RANDOM: 384 mg/dL — ABNORMAL HIGH (ref 65–179)
POTASSIUM: 6.9 mmol/L (ref 3.5–5.0)
SODIUM: 133 mmol/L — ABNORMAL LOW (ref 135–145)

## 2018-04-13 LAB — TROPONIN I: Troponin I.cardiac:MCnc:Pt:Ser/Plas:Qn:: <0.034

## 2018-04-13 LAB — PROTIME-INR: PROTIME: 10.7 s (ref 10.2–13.1)

## 2018-04-13 LAB — PROTIME: Lab: 10.7

## 2018-04-13 LAB — POTASSIUM
Potassium:SCnc:Pt:Ser/Plas:Qn:: 4.7
Potassium:SCnc:Pt:Ser/Plas:Qn:: 5.1 — ABNORMAL HIGH
Potassium:SCnc:Pt:Ser/Plas:Qn:: 5.1 — ABNORMAL HIGH
Potassium:SCnc:Pt:Ser/Plas:Qn:: 5.2 — ABNORMAL HIGH
Potassium:SCnc:Pt:Ser/Plas:Qn:: 5.3 — ABNORMAL HIGH
Potassium:SCnc:Pt:Ser/Plas:Qn:: 6.1

## 2018-04-13 LAB — CHLORIDE: Chloride:SCnc:Pt:Ser/Plas:Qn:: 96 — ABNORMAL LOW

## 2018-04-13 LAB — LACTATE DEHYDROGENASE: Lactate dehydrogenase:CCnc:Pt:Ser/Plas:Qn:: 321 — ABNORMAL LOW

## 2018-04-13 LAB — MAGNESIUM: Magnesium:MCnc:Pt:Ser/Plas:Qn:: 1.6

## 2018-04-13 LAB — HEMATOCRIT: Lab: 32.1 — ABNORMAL LOW

## 2018-04-13 LAB — CORTISOL: CORTISOL TOTAL: 22.8 ug/dL

## 2018-04-13 NOTE — Unmapped (Signed)
Discussed with Dr. Glenard Haring -    Isolated cortisol level of 14.2 mg/dL neither suggests normality nor insufficiency.  Please perform Cortrosyn Stimulation test with 250 mcg iv slow push (30-60 seconds).    Maximino Greenland MD MPH  Attending - Endocrinology, Diabetes, and Metabolism

## 2018-04-13 NOTE — Unmapped (Signed)
Patient A&O x4. VSS. On room air. Meds given as ordered. Denies pain. No acute changes.   Problem: Adult Inpatient Plan of Care  Goal: Plan of Care Review  Outcome: Progressing  Goal: Patient-Specific Goal (Individualization)  Outcome: Progressing  Goal: Absence of Hospital-Acquired Illness or Injury  Outcome: Progressing  Goal: Optimal Comfort and Wellbeing  Outcome: Progressing  Goal: Readiness for Transition of Care  Outcome: Progressing  Goal: Rounds/Family Conference  Outcome: Progressing     Problem: Infection  Goal: Infection Symptom Resolution  Outcome: Progressing     Problem: Infection (Cystic Fibrosis)  Goal: Absence of Infection Signs/Symptoms  Outcome: Progressing     Problem: Respiratory Compromise (Cystic Fibrosis)  Goal: Effective Oxygenation and Ventilation  Outcome: Progressing     Problem: Diabetes Comorbidity  Goal: Blood Glucose Level Within Desired Range  Outcome: Progressing

## 2018-04-13 NOTE — Unmapped (Addendum)
Meds given per MAR. VSS. SpO2 WNL on RA. No s/sx of respiratory distress noted. Pt complained he was updated regarding the lowered potassium restriction diet. Pt reports he was unable to order dinner and was only able to eat 4 icee cups. MDG paged and allowed pt to have dinner but 2g potassium restricted diet to resume in the morning.  BG was 548. MDG notified. Supplemental insulin and mealtime coverage given per MAR and BG improved to 294. POC continues     Problem: Adult Inpatient Plan of Care  Goal: Plan of Care Review  Outcome: Progressing  Flowsheets  Taken 04/13/2018 0629 by Julieta Gutting, RN  Progress: no change  Taken 04/08/2018 0612 by Leda Min, RN  Plan of Care Reviewed With: patient  Goal: Patient-Specific Goal (Individualization)  Outcome: Progressing  Flowsheets  Taken 04/08/2018 0612 by Leda Min, RN  Individualized Care Needs: Patient prefers to be called Walter [name]  Taken 04/13/2018 1610 by Julieta Gutting, RN  Anxieties, Fears or Concerns: Patient concerned about Potassium restricted diet  Goal: Absence of Hospital-Acquired Illness or Injury  Outcome: Progressing  Intervention: Identify and Manage Fall Risk  Flowsheets (Taken 04/12/2018 2100 by Bevelyn Buckles, CNA)  Safety Interventions: low bed  Intervention: Prevent Skin Injury  Flowsheets (Taken 04/11/2018 0900 by Adolm Joseph, CNA)  Pressure Reduction Techniques: frequent weight shift encouraged  Intervention: Prevent VTE (venous thromboembolism)  Flowsheets (Taken 04/10/2018 0834 by Velora Mediate, RN)  VTE Prevention/Management: ambulation promoted;fluids promoted  Intervention: Prevent Infection  Flowsheets (Taken 04/11/2018 0800 by Kandice Robinsons, RN)  Infection Prevention: handwashing promoted;personal protective equipment utilized;rest/sleep promoted;single patient room provided  Goal: Optimal Comfort and Wellbeing  Outcome: Progressing  Intervention: Monitor Pain and Promote Comfort  Flowsheets (Taken 04/11/2018 9604)  Pain Management Interventions: care clustered;pain management plan reviewed with patient/caregiver;relaxation techniques promoted  Intervention: Provide Person-Centered Care  Flowsheets (Taken 04/08/2018 0612 by Leda Min, RN)  Trust Relationship/Rapport: care explained;empathic listening provided;choices provided  Goal: Readiness for Transition of Care  Outcome: Progressing  Goal: Rounds/Family Conference  Outcome: Progressing     Problem: Infection  Goal: Infection Symptom Resolution  Outcome: Progressing  Intervention: Prevent or Manage Infection  Flowsheets  Taken 04/11/2018 0800 by Kandice Robinsons, RN  Infection Management: aseptic technique maintained  Taken 04/09/2018 2350 by Karna Christmas, RN  Fever Reduction/Comfort Measures: medication administered  Taken 04/12/2018 2000 by Julieta Gutting, RN  Isolation Precautions: contact precautions maintained     Problem: Infection (Cystic Fibrosis)  Goal: Absence of Infection Signs/Symptoms  Outcome: Progressing  Intervention: Manage Infection and Prevent Transmission  Flowsheets  Taken 04/11/2018 0800 by Kandice Robinsons, RN  Infection Management: aseptic technique maintained  Taken 04/12/2018 2000 by Julieta Gutting, RN  Isolation Precautions: contact precautions maintained     Problem: Respiratory Compromise (Cystic Fibrosis)  Goal: Effective Oxygenation and Ventilation  Outcome: Progressing     Problem: Diabetes Comorbidity  Goal: Blood Glucose Level Within Desired Range  Outcome: Progressing  Variance Patient/family refused or delayed decision  Impact: High  Note:   Pt continues to consume sugary, high carb food without coverage

## 2018-04-13 NOTE — Unmapped (Signed)
Patient refused all treatments today.

## 2018-04-13 NOTE — Unmapped (Signed)
Cystic Fibrosis - Brief Nutrition Note    Inpatient: MD Consult this admission and related follow up  Primary Pulmonologist/Referring Provider: Dr. Lurena Nida  ===================================================================  PLAN/INTERVENTION:  1. Continue potassium restricted diet.  2. Encouraged [name] to continue to use [facility] Diet Companion Guide for meal ordering during admission. Currently on 2 gm K+ restriction, discussed goal of ~600 mg potassium per meal (3 meals per day). He is agreeable.  - RD placed another copy of [place] Diet Companion Guide in paper chart should he or staff need an additional copy.  - Do not recommend PO supplement at this time.   3. Provided AND Potassium diet education from Diet Manual should he need to restrict potassium after discharge.     Inpatient:   Will continue to follow up with patient per protocol: 1-2 times per week (and more frequent as indicated)  ==================================================================   CLINICAL DATA:  Walter [name] is a 37 y.o. male seen today for review of potassium restricted diet education.   - [place] resting in bed upon RD visit this morning. He has been using Diet Companion Guide to order meals.   - Note hyperkalemia this morning, potassium of 6.9. Per RN notes ARR called, patient evaluated.    Diet prescription:    Active Orders   Diet    Nutrition Therapy High Calorie High Protein; Potassium Restricted (2 gm K)

## 2018-04-13 NOTE — Unmapped (Signed)
Endocrinology Consult Follow Up Note    Requesting Attending Physician :  Blair Dolphin, *  Service Requesting Consult : Pulmonology (MDG)  Primary Care Provider: Dellis Filbert, MD  Outpatient Endocrinologist: N/A    Assessment/Recommendations:    Walter [name] is a 37 y.o. 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 I have been asked to see in consultation at the request of [name] MD for evaluation of CFRD.    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 Complicated by infection and noncompliance. Presented in DKA and now off IV insulin infusion. Has had variable glucoses due to late night eating/snacking and not asking for insulin coverage. Patient encouraged to ask for insulin prior to meals/snacks. We went over how liquid carbohydrates like multiple 24g CHO powerades are going to cause extreme excursions, but he seemed indifferent to this.    Recommendations are as follows:  -Continue Lantus 34 units qhs  -Continue Lispro 16 units with meals up to 5x per day to cover for late night meals  -Continue standard correction scale 2:50>150    Hyperkalemia- Differential is broad. 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 Would rule out adrenal insufficiency as well.    - consider ACTH stim test     The patient was seen and discussed with Dr. Marcello Fennel.    We will continue to follow patient as needed. Please call/page [phone] with questions or concerns.     Elpidio Galea, MD  PGY4 Endocrinology Fellow    I saw and evaluated patient, participating in the critical and key portions of the service.  I discussed the findings, assessment, and plan with the resident/fellow, and agree with the resident/fellow's findings and plan as documented in the their note.  I was immediately available for the entirety of the of the history taking and examination, and present for key and critical portion thereof. Thane Edu, MD, MPH  Attending - Endocrinology and Metabolism      ------------------------------------------------------------    History of Present Illness:     Reason for Consult: Hyperglycemia    Walter [name] is a 37 y.o. male admitted for Cystic fibrosis with pulmonary exacerbation (CMS-HCC). I have been asked to evaluate [facility] for CFRD. He has been started on IV antibiotics. He is not on any steroids for this exacerbation.     Interval history: Glucose extremely variable and elevated. He had a potassium of 6.9 this morning and a rapid response. He got 34U Glargine and 60U lispro yesterday. He had multiple full sugar powerades in his room and full sugar syrup on his breakfast.    History from initial encounter:  Patient is well known to the consult service from prior admissions. He was last seen by our team on 02/03/18 at which point he was receiving Lantus 30 units qhs, Lispro 14 units with meals and on a standard correction scale. His most recent A1c is 9.6% (01/12/18). Patient reports DM medication noncompliance as he was focusing on trying to control his CF. He acknowledged that he takes up to 18 units of lispro with meals and 30 units of lantus at home but is not consistent. He presented with BG >1000 and an elevated anion gap. He received 10 units of regular insulin overnight and 15 units of NOH this morning. He was briefly placed on IV insulin gtt and his anion gap closed. He reports good appetite. Thus, we will  restart a prior known in-hospital regimen that worked.    Past Medical History:    Medical History:    Past Medical History:   Diagnosis Date   ??? Alcohol abuse    ??? 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 Bipolar disorder (CMS-HCC)    ??? Chronic pancreatitis (CMS-HCC)    ??? Chronic sinusitis    ??? 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 Cystic fibrosis (CMS-HCC)    ??? Diabetes mellitus (CMS-HCC)     dx in-house 2015   ??? Disease of thyroid gland    ??? Hypertension    ??? Kidney stones    ??? Portal hypertension (CMS-HCC)        Surgical History:    Past Surgical History:   Procedure Laterality Date   ??? CHOLECYSTECTOMY  2008   ??? sinus surgery     ??? TONSILLECTOMY       Allergies:  Patient has no known allergies.    All Medications:   Current Facility-Administered Medications   Medication Dose Route Frequency Provider Last Rate Last Dose   ??? acetaminophen (TYLENOL) tablet 500 mg  500 mg Oral Q6H PRN Azucena Freed, MD       ??? albuterol 2.5 mg /3 mL (0.083 %) nebulizer solution 2.5 mg  2.5 mg Nebulization 4x Daily (RT) Mackey Birchwood, MD   2.5 mg at 04/12/18 1829   ??? busPIRone (BUSPAR) tablet 15 mg  15 mg Oral TID Mackey Birchwood, MD   15 mg at 04/12/18 2039   ??? ceftaroline (TEFLARO) 600 mg in sodium chloride (NS) 0.9 % 50 mL IVPB  600 mg Intravenous Q12H Mackey Birchwood, MD 308 mL/hr at 04/13/18 0342 600 mg at 04/13/18 0342   ??? cholecalciferol (vitamin D3) tablet 7,500 Units  7,500 Units Oral Daily Mackey Birchwood, MD   7,500 Units at 04/12/18 1032   ??? ciprofloxacin HCl (CIPRO) tablet 750 mg  750 mg Oral Q12H SCH Azucena Freed, MD   750 mg at 04/12/18 2039   ??? insulin regular (HumuLIN,NovoLIN) injection 10 Units  10 Units Intravenous Once Darliss Cheney Nugent, MD        Followed by   ??? dextrose (D10W) 10% bolus 125 mL  12.5 g Intravenous Once Darliss Cheney Nugent, MD       ??? dextrose (D10W) 10% bolus 250 mL  25 g Intravenous Q30 Min PRN Wyatt Mage, MD       ??? dornase alfa (PULMOZYME) 1 mg/mL nebulizer solution 2.5 mg  2.5 mg Inhalation Daily (RT) Mackey Birchwood, MD   2.5 mg at 04/12/18 1133   ??? folic acid (FOLVITE) tablet 1 mg  1 mg Oral Daily Mackey Birchwood, MD   1 mg at 04/12/18 1032   ??? heparin (porcine) injection 5,000 Units  5,000 Units Subcutaneous [facility] Mackey Birchwood, MD   5,000 Units at 04/10/18 2025   ??? heparin, porcine (PF) 100 unit/mL injection 200 Units  200 Units Intravenous Daily PRN Azucena Freed, MD       ??? hydrocortisone 1 % cream   Topical Daily PRN Mackey Birchwood, MD ??? insulin glargine (LANTUS) injection 34 Units  34 Units Subcutaneous Nightly Lovie Chol, MD   34 Units at 04/12/18 2041   ??? insulin lispro (HumaLOG) injection 0-12 Units  0-12 Units Subcutaneous ACHS Wyatt Mage, MD   12 Units at 04/12/18 2040   ??? insulin lispro (HumaLOG) injection 16 Units  16 Units  Subcutaneous 5XD insulin Lovie Chol, MD   16 Units at 04/12/18 2040   ??? ketoconazole (NIZORAL) 2 % cream 1 application  1 application Topical Daily Mackey Birchwood, MD   1 application at 04/11/18 0925   ??? lactulose (CHRONULAC) oral solution (30 mL cup)  30 g Oral TID Wyatt Mage, MD   30 g at 04/12/18 2040   ??? lactulose (CHRONULAC) oral solution (30 mL cup)  20 g Oral Once PRN Wyatt Mage, MD       ??? lidocaine (LIDODERM) 5 % patch 2 patch  2 patch Transdermal Q24H Mackey Birchwood, MD   Stopped at 04/07/18 0941   ??? lipase-protease-amylase (ZENPEP) 20,000-63,000- 84,000 unit capsule, delayed release 240,000 units of lipase  12 capsule Oral 3xd Meals Mackey Birchwood, MD   240,000 units of lipase at 04/12/18 2039   ??? magnesium oxide (MAG-OX) tablet 800 mg  800 mg Oral BID Azucena Freed, MD   800 mg at 04/12/18 1909   ??? melatonin tablet 3 mg  3 mg Oral QPM Mackey Birchwood, MD   3 mg at 04/12/18 2039   ??? meropenem (MERREM) 2 g in sodium chloride (NS) 0.9 % 100 mL IVPB  2 g Intravenous Q12H Azucena Freed, MD 150 mL/hr at 04/13/18 0524 2 g at 04/13/18 0524   ??? mirtazapine (REMERON) tablet 45 mg  45 mg Oral Nightly Mackey Birchwood, MD   45 mg at 04/12/18 2039   ??? MVW Complete (pediatric multivit 61-D3-vit K) 1,500-800 unit-mcg 2 capsule  2 capsule Oral Daily Mackey Birchwood, MD   2 capsule at 04/12/18 1233   ??? OLANZapine (ZYPREXA) tablet 15 mg  15 mg Oral Nightly Mackey Birchwood, MD   15 mg at 04/12/18 2039   ??? OLANZapine (ZYPREXA) tablet 2.5 mg  2.5 mg Oral BID PRN Azucena Freed, MD       ??? oxyCODONE (ROXICODONE) 5 MG immediate release tablet            ??? oxyCODONE (ROXICODONE) immediate release tablet 5 mg  5 mg Oral Q8H PRN Azucena Freed, MD       ??? pantoprazole (PROTONIX) EC tablet 40 mg  40 mg Oral BID with meals Mackey Birchwood, MD   40 mg at 04/12/18 1909   ??? phytonadione (vitamin K1) (MEPHYTON) tablet 5 mg  5 mg Oral Q W and Sa Mackey Birchwood, MD   5 mg at 04/09/18 1610   ??? polyethylene glycol (MIRALAX) packet 17 g  17 g Oral BID Mackey Birchwood, MD   17 g at 04/06/18 9604   ??? polyethylene glycol (MIRALAX) packet 17 g  17 g Oral TID PRN Mackey Birchwood, MD       ??? sodium chloride 7% nebulizer solution 4 mL  4 mL Nebulization 4x Daily (RT) Mackey Birchwood, MD   4 mL at 04/12/18 1829   ??? sodium polystyrene sulfonate (SPS) oral suspension  15 g Oral Once Azucena Freed, MD       ??? thiamine (B-1) tablet 200 mg  200 mg Oral Daily Mackey Birchwood, MD   200 mg at 04/12/18 1031   ??? traZODone (DESYREL) tablet 50 mg  50 mg Oral Nightly Mackey Birchwood, MD   50 mg at 04/12/18 2039       Social History:     Social History     Socioeconomic History   ??? Marital status: Single     Spouse name: None   ??? Number of  children: 0   ??? Years of education: 12+   ??? Highest education level: Bachelor's degree (e.g., BA, AB, BS)   Occupational History   ??? Occupation: Restaurant manager, fast food     Comment: works at [employer]   ??? Financial resource strain: Very hard   ??? Food insecurity:     Worry: Often true     Inability: Often true   ??? Transportation needs:     Medical: No     Non-medical: No   Tobacco Use   ??? Smoking status: Former Smoker     Packs/day: 1.00     Years: 3.00     Pack years: 3.00     Types: Cigarettes   ??? Smokeless tobacco: Never Used   ??? Tobacco comment: reports quit prior to last admission   Substance and Sexual Activity   ??? Alcohol use: Yes     Alcohol/week: 0.0 standard drinks     Comment: A fifth of [place] per day    ??? Drug use: Not Currently     Comment: hx of alcohol and prescription pain medication abuse   ??? Sexual activity: Not Currently     Partners: Female   Lifestyle   ??? Physical activity:     Days per week: 0 days     Minutes per session: 0 min ??? Stress: Very much   Relationships   ??? Social connections:     Talks on phone: Twice a week     Gets together: Once a week     Attends religious service: Never     Active member of club or organization: No     Attends meetings of clubs or organizations: Never     Relationship status: Never married   Other Topics Concern   ??? 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 Hillard Danker with [facility] in [place]    Current therapist: Nehemiah Settle with CF team        SUBSTANCE ABUSE HX:     # MJ     -Current use: once in awhile    # Alcohol     -Current use: Was sober from 2016 until two weeks ago. Admits to drinking enough to get drunk and admits to passing out. Denies history of complicated withdrawal. Admits to withdrawal symptoms of tremors, anxiety, headache, and tactile hallucinations at times.     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     Physical Exam:  Constitutional: alert, no acute distress, sleepy but arouses.  ENT: eyes closed, mucous membranes moist  Resp: clear to auscultation, symmetric air entry  MSK: no obvious joint deformity, feet-no obvious cuts, scabs, bruises, sensation intact  Neurological: awake, oriented to person, place, and time, normal speech.  Lymph:no pedal edema  Skin: normal coloration and turgor, no rashes  Psych: flat affect    Test Results    Data Review  Lab Results   Component Value Date    POCGLU 377 (H) 04/13/2018    POCGLU 294 (H) 04/12/2018    POCGLU 548 (HH) 04/12/2018    POCGLU 136 04/12/2018    POCGLU 74 04/12/2018    POCGLU 78 04/12/2018    POCGLU 230 (H) 04/11/2018       Lab Results   Component Value Date    A1C 13.3 (H) 04/06/2018    A1C 9.6 (H) 01/12/2018    A1C 11.9 (H) 11/17/2017     Lab Results   Component Value Date    GFR >= 60 10/31/2010    CREATININE 1.56 (H) 04/13/2018     Lab Results   Component Value Date    CHOL 116 01/29/2018     Lab Results   Component Value Date    HDL 61 (H) 01/29/2018     Lab Results   Component Value Date    LDL 35 (L) 01/29/2018     Lab Results   Component Value Date    TRIG 102 01/29/2018

## 2018-04-13 NOTE — Unmapped (Signed)
Daily Progress Note    24hr Events/Interval History:   Patient hyperkalemic yesterday that down trended after starting low K diet. However he ran out of his potassium rationing so was given a one time normal diet at  a repeat K this morning of 6.1 so EKG, Ca gluconate, insulin/dextrose, kaexylate and lactulose ordered. EKG read as acute STEMI. Upon further review with cardiology fellow appeared most consistent with electrolyte abnormalities. Repeat K prior to medications given was 6.9. Given above medications and repeat EKG showed improvement. Denies any chest pain, nausea, shortness of breath, upper extremity pain, palpitations, or sweating. A repeat K of 5.2 a few hours later.       Assessment/Plan:    Spyridon Hornstein is a 37 y.o. male who presented to [facility] with Cystic fibrosis with pulmonary exacerbation (CMS-HCC) and hyperglycemia without DKA.    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 *      CF with acute pulmonary exacerbation:  Prior cultures with mucoid and smooth PsA, H flu, achromobacter. CF sputum cultures positive with mucoid PsA, achromobacter. Mucoid PsA (S:Aztreonam, Ceftaz, Cipro, Zosyn, Tobra, I:Levo, R:Amikacin). Continue current antibiotics as they are sensitive. PICC line in place.  - airway clearance with vest, HTS, albuterol,pulmozyme  - Continue meropenem/ceftaroline/cipro (10/22-11/5)     Hyperkalemia- etiology thought to be 2/2 recurrent episodes of hyperglycemia and CKD (baseline Cr around 1.5). However, K continues to trend upwards and was as high as 6.9 with EKG changes. Looking into other causes of hyperkalemia such as adrenal insufficiciency, RTA, cell lysis. It is possible he has a RTA however, this morning his bicarb is 28 which is not consistent with NAGMA, . Has a history of previous IV tobramycin that has been documented to cause RTA like hyperkalemia in patient. His other medications are not known to cause hyperkalemia.  -ACTH stim test  -frequent K checks  -strict low K diet     CF related DM, hyperglycemia: A1C 10/19 13.3%, persistent recurrences of hyperglycemia. Frequent snacking seems to play a role.   -endo recs appreciated  ??  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 He has not experienced any change in swelling lately. Liver enzymes uptrending. He is being treated with Meropenem. Will continue to monitor and may need Meropenem dose adjustment.   - LFTs daily  - CTM, abdominal ultrasound if changes in abdominal symptoms  - HCC and variceal screening on discharge  ??  CKD: likely has component of CKD at baseline with Cr fluctuating around 1.5. Stable today.  - CTM  - encourage po intake  - Per pharmacy, no dose adjustment to meropenem at this time  ??  Substance abuse: No recent concern for withdrawal. Holding sedating medications.   - CTM closely  - MVI, thiamine, folate  ??  Anxiety: struggles with anxiety  and has a history of suicide attempt. We are continuing home meds which include Buspar and Trazodone. Psych consult with recs appreciated.   - holding gabapentin 2/2 asterixis and lethargy  ??  CF related pancreatic insufficiency: Zen pep regimen, vitamin regimen, vitamin K twice a week.  ??    Daily checklist:   Diet: low potassium  VTE ppx: heparin  GI ppx: PPI  Lytes: replete PRN to keep K>4 and Mag>2  IV Access: PIV  Code status: Full Code   Dispo: Likely inpatient for course of IV antibiotics  ___________________________________________________________________      Labs/Studies:  Labs and Studies from the last 24hrs per EMR and Reviewed          Objective:  Temp:  [36.3 ??C-36.7 ??C] 36.7 ??C  Heart Rate:  [68-79] 75  Resp:  [11-20] 20  BP: (104-148)/(59-96) 143/96  SpO2:  [93 %-97 %] 95 %    GENERAL: Chronically ill appearing gentleman sitting up in bed, NAD, resting comfortably  HEENT: EOMI  PULM: normal work of breathing, moving air bilaterally  EXT: LUE PICC line intact  NEURO: Grossly intact    Azucena Freed, MD  Internal Medicine  PGY-1

## 2018-04-14 LAB — COMPREHENSIVE METABOLIC PANEL
ALBUMIN: 3 g/dL — ABNORMAL LOW (ref 3.5–5.0)
ALKALINE PHOSPHATASE: 587 U/L — ABNORMAL HIGH (ref 38–126)
ALT (SGPT): 68 U/L (ref 19–72)
ANION GAP: 8 mmol/L (ref 7–15)
AST (SGOT): 77 U/L — ABNORMAL HIGH (ref 19–55)
BILIRUBIN TOTAL: 0.3 mg/dL (ref 0.0–1.2)
BLOOD UREA NITROGEN: 16 mg/dL (ref 7–21)
CALCIUM: 8.3 mg/dL — ABNORMAL LOW (ref 8.5–10.2)
CHLORIDE: 100 mmol/L (ref 98–107)
CO2: 27 mmol/L (ref 22.0–30.0)
CREATININE: 1.38 mg/dL — ABNORMAL HIGH (ref 0.70–1.30)
EGFR CKD-EPI AA MALE: 76 mL/min/{1.73_m2} (ref >=60)
EGFR CKD-EPI NON-AA MALE: 66 mL/min/{1.73_m2} (ref >=60)
GLUCOSE RANDOM: 464 mg/dL (ref 65–179)
POTASSIUM: 5.3 mmol/L — ABNORMAL HIGH (ref 3.5–5.0)
SODIUM: 135 mmol/L (ref 135–145)

## 2018-04-14 LAB — PROTIME-INR
INR: 0.91
PROTIME: 10.4 s (ref 10.2–13.1)

## 2018-04-14 LAB — CBC
HEMATOCRIT: 31.9 % — ABNORMAL LOW (ref 41.0–53.0)
HEMOGLOBIN: 9.8 g/dL — ABNORMAL LOW (ref 13.5–17.5)
MEAN CORPUSCULAR HEMOGLOBIN CONC: 30.8 g/dL — ABNORMAL LOW (ref 31.0–37.0)
MEAN CORPUSCULAR HEMOGLOBIN: 28.3 pg (ref 26.0–34.0)
MEAN CORPUSCULAR VOLUME: 92 fL (ref 80.0–100.0)
MEAN PLATELET VOLUME: 7.8 fL (ref 7.0–10.0)
RED BLOOD CELL COUNT: 3.47 10*12/L — ABNORMAL LOW (ref 4.50–5.90)
RED CELL DISTRIBUTION WIDTH: 14.3 % (ref 12.0–15.0)
WBC ADJUSTED: 6.2 10*9/L (ref 4.5–11.0)

## 2018-04-14 LAB — INR: Lab: 0.91

## 2018-04-14 LAB — GLUCOSE RANDOM: Glucose:MCnc:Pt:Ser/Plas:Qn:: 464

## 2018-04-14 LAB — MAGNESIUM: Magnesium:MCnc:Pt:Ser/Plas:Qn:: 1.6

## 2018-04-14 LAB — PLATELET COUNT: Lab: 163

## 2018-04-14 LAB — POTASSIUM: Potassium:SCnc:Pt:Ser/Plas:Qn:: 4.1

## 2018-04-14 NOTE — Unmapped (Signed)
Endocrinology Consult Follow Up Note    Requesting Attending Physician :  Walter [name], *  Service Requesting Consult : Pulmonology (MDG)  Primary Care Provider: Dellis Filbert, MD  Outpatient Endocrinologist: N/A    Assessment/Recommendations:    Walter [name] is a 37 y.o. 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 I have been asked to see in consultation at the request of Walter Hight MD for evaluation of CFRD.    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 Complicated by infection and noncompliance. Presented in DKA and now off IV insulin infusion. Has had variable glucoses due to late night eating/snacking and not asking for insulin coverage. Patient encouraged to ask for insulin prior to meals/snacks. It is best if we don't overcorrect- try to get on top of sugars with mealtime insulin pre-ingestion of the carbs. We talked about good food choices today- with the recognition that his room is full of powerade, chips, and dessert sweets- I don't anticipate he will improve his intake of simple sugars so we can increase insulin to cover this    Recommendations are as follows:    -Lantus 40 units qhs  -Lispro 20 units with meals up to 5x per day to cover for late night meals. Hold if NPO, half dose if small meal  -Continue standard correction scale 2:50>150    Hyperkalemia- Differential is broad. 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 Adrenal insufficiency has been ruled out with ACTH stim from 5.3 to 22.8    The patient was seen and discussed with Dr. Marcello [name].    We will continue to follow patient as needed. Please call/page [phone] with questions or concerns.     Walter Galea, MD  PGY4 Endocrinology Fellow    Coordinated care with Dr. Reggie [name], attending on Pulmonary service.    I saw and evaluated patient, participating in the critical and key portions of the service.  I discussed the findings, assessment, and plan with the resident/fellow, and agree with the resident/fellow's findings and plan as documented in the their note.  I was immediately available for the entirety of the of the history taking and examination, and present for key and critical portion thereof.    Walter Edu, MD, MPH  Attending - Endocrinology and Metabolism      ------------------------------------------------------------    History of Present Illness:     Reason for Consult: Hyperglycemia    Walter [name] is a 37 y.o. male admitted for Cystic fibrosis with pulmonary exacerbation (CMS-HCC). I have been asked to evaluate Walter [name] for CFRD. He has been started on IV antibiotics. He is not on any steroids for this exacerbation.     Interval history: Glucose extremely variable and elevated. No hypoglycemia. He corrected from 450 to 237 with 12U lispro yesterday which is an insulin sensitivity of 1:18. Yesterday he required 34U long acting and 78U rapid acting. He tells me he had several sherberts last night.    History from initial encounter:  Patient is well known to the consult service from prior admissions. He was last seen by our team on 02/03/18 at which point he was receiving Lantus 30 units qhs, Lispro 14 units with meals and on a standard correction scale. His most recent A1c is 9.6% (01/12/18). Patient reports DM medication noncompliance as he was focusing on trying to control his CF. He acknowledged that he takes up to 18  units of lispro with meals and 30 units of lantus at home but is not consistent. He presented with BG >1000 and an elevated anion gap. He received 10 units of regular insulin overnight and 15 units of NOH this morning. He was briefly placed on IV insulin gtt and his anion gap closed. He reports good appetite. Thus, we will restart a prior known in-[name] regimen that worked.    Past Medical History:    Medical History:    Past Medical History:   Diagnosis Date   ??? Alcohol abuse    ??? 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 Bipolar disorder (CMS-HCC)    ??? Chronic pancreatitis (CMS-HCC)    ??? Chronic sinusitis    ??? 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 Cystic fibrosis (CMS-HCC)    ??? Diabetes mellitus (CMS-HCC)     dx in-house 2015   ??? Disease of thyroid gland    ??? Hypertension    ??? Kidney stones    ??? Portal hypertension (CMS-HCC)        Surgical History:    Past Surgical History:   Procedure Laterality Date   ??? CHOLECYSTECTOMY  2008   ??? sinus surgery     ??? TONSILLECTOMY       Allergies:  Patient has no known allergies.    All Medications:   Current Facility-Administered Medications   Medication Dose Route Frequency Provider Last Rate Last Dose   ??? acetaminophen (TYLENOL) tablet 500 mg  500 mg Oral Q6H PRN Azucena Freed, MD       ??? albuterol 2.5 mg /3 mL (0.083 %) nebulizer solution 2.5 mg  2.5 mg Nebulization 4x Daily (RT) Mackey Birchwood, MD   2.5 mg at 04/13/18 1835   ??? busPIRone (BUSPAR) tablet 15 mg  15 mg Oral TID Mackey Birchwood, MD   15 mg at 04/13/18 2045   ??? ceftaroline (TEFLARO) 600 mg in sodium chloride (NS) 0.9 % 50 mL IVPB  600 mg Intravenous Q12H Mackey Birchwood, MD   Stopped at 04/14/18 0243   ??? cholecalciferol (vitamin D3) tablet 7,500 Units  7,500 Units Oral Daily Mackey Birchwood, MD   7,500 Units at 04/13/18 1027   ??? ciprofloxacin HCl (CIPRO) tablet 750 mg  750 mg Oral Q12H SCH Azucena Freed, MD   750 mg at 04/13/18 2045   ??? insulin regular (HumuLIN,NovoLIN) injection 10 Units  10 Units Intravenous Once Azucena Freed, MD   Stopped at 04/13/18 0920    Followed by   ??? dextrose (D10W) 10% bolus 125 mL  12.5 g Intravenous Once Azucena Freed, MD   Stopped at 04/13/18 0920   ??? dextrose (D10W) 10% bolus 250 mL  25 g Intravenous Q30 Min PRN Wyatt Mage, MD       ??? dornase alfa (PULMOZYME) 1 mg/mL nebulizer solution 2.5 mg  2.5 mg Inhalation Daily (RT) Mackey Birchwood, MD   2.5 mg at 04/13/18 1120   ??? folic acid (FOLVITE) tablet 1 mg  1 mg Oral Daily Mackey Birchwood, MD   1 mg at 04/13/18 1027   ??? heparin (porcine) injection 5,000 Units  5,000 Units Subcutaneous [facility] Mackey Birchwood, MD   5,000 Units at 04/10/18 2025   ??? heparin, porcine (PF) 100 unit/mL injection 200 Units  200 Units Intravenous Daily PRN Azucena Freed, MD       ??? hydrocortisone 1 % cream   Topical Daily PRN Amit Ringel,  MD       ??? insulin glargine (LANTUS) injection 34 Units  34 Units Subcutaneous Nightly Lovie Chol, MD   34 Units at 04/13/18 2045   ??? insulin lispro (HumaLOG) injection 0-12 Units  0-12 Units Subcutaneous ACHS Wyatt Mage, MD   4 Units at 04/13/18 2054   ??? insulin lispro (HumaLOG) injection 16 Units  16 Units Subcutaneous 5XD insulin Lovie Chol, MD   10 Units at 04/14/18 0651   ??? ketoconazole (NIZORAL) 2 % cream 1 application  1 application Topical Daily Mackey Birchwood, MD   1 application at 04/11/18 0925   ??? lactulose (CHRONULAC) oral solution (30 mL cup)  30 g Oral TID Wyatt Mage, MD   30 g at 04/13/18 2045   ??? lactulose (CHRONULAC) oral solution (30 mL cup)  20 g Oral Once PRN Wyatt Mage, MD       ??? lidocaine (LIDODERM) 5 % patch 2 patch  2 patch Transdermal Q24H Mackey Birchwood, MD   Stopped at 04/07/18 0941   ??? lipase-protease-amylase (ZENPEP) 20,000-63,000- 84,000 unit capsule, delayed release 240,000 units of lipase  12 capsule Oral 3xd Meals Mackey Birchwood, MD   240,000 units of lipase at 04/13/18 1550   ??? magnesium oxide (MAG-OX) tablet 800 mg  800 mg Oral BID Azucena Freed, MD   800 mg at 04/13/18 1827   ??? melatonin tablet 3 mg  3 mg Oral QPM Mackey Birchwood, MD   3 mg at 04/13/18 2045   ??? meropenem (MERREM) 2 g in sodium chloride (NS) 0.9 % 100 mL IVPB  2 g Intravenous Q12H Azucena Freed, MD   Stopped at 04/14/18 0422   ??? mirtazapine (REMERON) tablet 45 mg  45 mg Oral Nightly Mackey Birchwood, MD   45 mg at 04/13/18 2044   ??? MVW Complete (pediatric multivit 61-D3-vit K) 1,500-800 unit-mcg 2 capsule  2 capsule Oral Daily Mackey Birchwood, MD   2 capsule at 04/13/18 1201   ??? OLANZapine (ZYPREXA) tablet 15 mg  15 mg Oral Nightly Mackey Birchwood, MD   15 mg at 04/13/18 2053   ??? OLANZapine (ZYPREXA) tablet 2.5 mg  2.5 mg Oral BID PRN Azucena Freed, MD       ??? oxyCODONE (ROXICODONE) 5 MG immediate release tablet            ??? pantoprazole (PROTONIX) EC tablet 40 mg  40 mg Oral BID with meals Mackey Birchwood, MD   40 mg at 04/13/18 1828   ??? phytonadione (vitamin K1) (MEPHYTON) tablet 5 mg  5 mg Oral Q W and Sa Mackey Birchwood, MD   5 mg at 04/13/18 1026   ??? polyethylene glycol (MIRALAX) packet 17 g  17 g Oral BID Mackey Birchwood, MD   17 g at 04/06/18 1610   ??? polyethylene glycol (MIRALAX) packet 17 g  17 g Oral TID PRN Mackey Birchwood, MD       ??? sodium chloride 7% nebulizer solution 4 mL  4 mL Nebulization 4x Daily (RT) Mackey Birchwood, MD   4 mL at 04/13/18 1835   ??? thiamine (B-1) tablet 200 mg  200 mg Oral Daily Mackey Birchwood, MD   200 mg at 04/13/18 1026   ??? traZODone (DESYREL) tablet 50 mg  50 mg Oral Nightly Mackey Birchwood, MD   50 mg at 04/13/18 2045       Social History:     Social History     Socioeconomic History   ???  Marital status: Single     Spouse name: None   ??? Number of children: 0   ??? Years of education: 12+   ??? Highest education level: Bachelor's degree (e.g., BA, AB, BS)   Occupational History   ??? Occupation: Restaurant manager, fast food     Comment: works at [employer]   ??? Financial resource strain: Very hard   ??? Food insecurity:     Worry: Often true     Inability: Often true   ??? Transportation needs:     Medical: No     Non-medical: No   Tobacco Use   ??? Smoking status: Former Smoker     Packs/day: 1.00     Years: 3.00     Pack years: 3.00     Types: Cigarettes   ??? Smokeless tobacco: Never Used   ??? Tobacco comment: reports quit prior to last admission   Substance and Sexual Activity   ??? Alcohol use: Yes     Alcohol/week: 0.0 standard drinks     Comment: A fifth of [place] per day    ??? Drug use: Not Currently     Comment: hx of alcohol and prescription pain medication abuse   ??? Sexual activity: Not Currently     Partners: Female   Lifestyle   ??? Physical activity:     Days per week: 0 days     Minutes per session: 0 min   ??? Stress: Very much   Relationships   ??? Social connections:     Talks on phone: Twice a week     Gets together: Once a week     Attends religious service: Never     Active member of club or organization: No     Attends meetings of clubs or organizations: Never     Relationship status: Never married   Other Topics Concern   ??? 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 Hillard Danker with [facility] in [place]    Current therapist: Nehemiah Settle with CF team        SUBSTANCE ABUSE HX:     # MJ     -Current use: once in awhile    # Alcohol     -Current use: Was sober from 2016 until two weeks ago. Admits to drinking enough to get drunk and admits to passing out. Denies history of complicated withdrawal. Admits to withdrawal symptoms of tremors, anxiety, headache, and tactile hallucinations at times.     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     Physical Exam:  Constitutional: alert, no acute distress, sleepy but arouses.  ENT: eyes closed, mucous membranes moist  Resp: clear to auscultation, symmetric air entry  MSK: no obvious joint deformity, feet-no obvious cuts, scabs, bruises, sensation intact  Neurological: awake, oriented to person, place, and time, normal speech.  Lymph:no pedal edema  Skin: normal coloration and turgor, no rashes  Psych: flat affect    Test Results    Data Review  Lab Results   Component Value Date    POCGLU 237 (H) 04/13/2018    POCGLU 450 (HH) 04/13/2018    POCGLU 209 (H) 04/13/2018    POCGLU 195 (H) 04/13/2018    POCGLU 377 (H) 04/13/2018    POCGLU 294 (H) 04/12/2018    POCGLU 548 (HH) 04/12/2018       Lab Results   Component Value Date    A1C 13.3 (H) 04/06/2018    A1C 9.6 (H) 01/12/2018    A1C 11.9 (H) 11/17/2017     Lab Results   Component Value Date    GFR >= 60 10/31/2010    CREATININE 1.38 (H) 04/14/2018     Lab Results   Component Value Date    CHOL 116 01/29/2018     Lab Results   Component Value Date    HDL 61 (H) 01/29/2018     Lab Results   Component Value Date    LDL 35 (L) 01/29/2018     Lab Results   Component Value Date    TRIG 102 01/29/2018

## 2018-04-14 NOTE — Unmapped (Signed)
Patient AOX4, VSS throughout shift. All meds given on time as ordered. Pt denied pain and SOB. Pt ambulated in room and ate one dinner tray last night. Insulin lispro sliding scale and meal dose coverage given. Pt sleeping in bed. POC maintained. Will continue to monitor.    Problem: Adult Inpatient Plan of Care  Goal: Plan of Care Review  Outcome: Progressing  Goal: Patient-Specific Goal (Individualization)  Outcome: Progressing  Goal: Absence of Hospital-Acquired Illness or Injury  Outcome: Progressing  Goal: Optimal Comfort and Wellbeing  Outcome: Progressing  Goal: Readiness for Transition of Care  Outcome: Progressing  Goal: Rounds/Family Conference  Outcome: Progressing     Problem: Infection  Goal: Infection Symptom Resolution  Outcome: Progressing     Problem: Infection (Cystic Fibrosis)  Goal: Absence of Infection Signs/Symptoms  Outcome: Progressing     Problem: Respiratory Compromise (Cystic Fibrosis)  Goal: Effective Oxygenation and Ventilation  Outcome: Progressing     Problem: Diabetes Comorbidity  Goal: Blood Glucose Level Within Desired Range  Outcome: Progressing

## 2018-04-14 NOTE — Unmapped (Signed)
Patient had a rapid response during shift change this morning. EKG showed ST elevation related to high potassium.  See rapid documentation. Patient was asymptomatic and potassium was monitored throughout the shift. Patient A&O x4. VSS. On room air. Meds given as ordered. Patient refused assessment. Denies pain. Patient reported a BM today.     Problem: Adult Inpatient Plan of Care  Goal: Plan of Care Review  Outcome: Ongoing - Unchanged  Goal: Patient-Specific Goal (Individualization)  Outcome: Ongoing - Unchanged  Goal: Absence of Hospital-Acquired Illness or Injury  Outcome: Ongoing - Unchanged  Goal: Optimal Comfort and Wellbeing  Outcome: Ongoing - Unchanged  Goal: Readiness for Transition of Care  Outcome: Ongoing - Unchanged  Goal: Rounds/Family Conference  Outcome: Ongoing - Unchanged     Problem: Infection  Goal: Infection Symptom Resolution  Outcome: Ongoing - Unchanged     Problem: Infection (Cystic Fibrosis)  Goal: Absence of Infection Signs/Symptoms  Outcome: Ongoing - Unchanged     Problem: Respiratory Compromise (Cystic Fibrosis)  Goal: Effective Oxygenation and Ventilation  Outcome: Ongoing - Unchanged     Problem: Diabetes Comorbidity  Goal: Blood Glucose Level Within Desired Range  Outcome: Ongoing - Unchanged

## 2018-04-14 NOTE — Unmapped (Signed)
Daily Progress Note    24hr Events/Interval History:     NAEON, has been trying to tell nursing when he snacks so he gets insulin coverages but states he missed getting meal time coverage once overnight. Confirms ordering from low K diet. He is feeling well and no difficulty breathing. AM K of 5.3 and BS >400s.    Assessment/Plan:    Shiheem Corporan is a 37 y.o. male who presented to [facility] with Cystic fibrosis with pulmonary exacerbation (CMS-HCC) and hyperglycemia without DKA.    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 *      CF with acute pulmonary exacerbation:  CF sputum cultures positive with mucoid PsA, achromobacter. Mucoid PsA (S:Aztreonam, Ceftaz, Cipro, Zosyn, Tobra, I:Levo, R:Amikacin). PICC line in place.  - airway clearance with vest, HTS, albuterol,pulmozyme  - Continue meropenem/ceftaroline/cipro (10/22-11/5)     Hyperkalemia- etiology thought to be 2/2 recurrent episodes of hyperglycemia and CKD (baseline Cr around 1.5). A stim test was performed and he demonstrated appropriate response with cortisol of 18 and 22 s/p cosyntropin. It is possible he has RTA in setting of CF and hx of IV tobramycin, however bicarb is elevated at 27 not consistent with NAGMA.  -frequent K checks  -strict low K diet   -endocrine consult, appreciate recs    CF related DM, hyperglycemia: A1C 10/19 13.3%, persistent recurrences of hyperglycemia. Frequent snacking seems to play a role.   -increasing Lantus and mealtime insulin today  -endo recs appreciated  ??  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  - LFTs daily  - CTM, abdominal ultrasound if changes in abdominal symptoms  - HCC and variceal screening on discharge  ??  CKD: likely has component of CKD at baseline with Cr fluctuating around 1.5. Stable today.  - CTM  - encourage po intake  - Per pharmacy, no dose adjustment to meropenem at this time  ??  Substance abuse: No recent concern for withdrawal. Holding sedating medications.   - CTM closely  - MVI, thiamine, folate  ??  Anxiety: struggles with anxiety and has a history of suicide attempt. We are continuing home meds which include Buspar and Trazodone. Psych consult with recs appreciated.   - holding gabapentin 2/2 asterixis and lethargy  ??  CF related pancreatic insufficiency: Zen pep regimen, vitamin regimen, vitamin K twice a week.  ??    Daily checklist:   Diet: low potassium  VTE ppx: heparin  GI ppx: PPI  Lytes: replete PRN to keep K>4 and Mag>2  IV Access: PIV  Code status: Full Code   Dispo: Likely inpatient for course of IV antibiotics  ___________________________________________________________________      Labs/Studies:  Labs and Studies from the last 24hrs per EMR and Reviewed          Objective:  Temp:  [  36.6 ??C-37.1 ??C] 36.6 ??C  Heart Rate:  [63-81] 64  Resp:  [16-20] 18  BP: (117-140)/(73-97) 140/97  SpO2:  [94 %-100 %] 96 %    GENERAL: Chronically ill appearing gentleman sitting up in bed, NAD, resting comfortably  HEENT: EOMI  Cardiovascular: RRR, no murmurs  PULM: normal work of breathing, moving air bilaterally, no crackles, wheezing or rhonchi  ABDOMINAL: Soft, NT, ND  EXT: LUE PICC line intact  NEURO: Grossly intact    Azucena Freed, MD  Internal Medicine  PGY-1

## 2018-04-15 LAB — CBC
HEMOGLOBIN: 9.3 g/dL — ABNORMAL LOW (ref 13.5–17.5)
MEAN CORPUSCULAR HEMOGLOBIN CONC: 32.9 g/dL (ref 31.0–37.0)
MEAN CORPUSCULAR HEMOGLOBIN: 29.2 pg (ref 26.0–34.0)
MEAN CORPUSCULAR VOLUME: 89 fL (ref 80.0–100.0)
MEAN PLATELET VOLUME: 8 fL (ref 7.0–10.0)
PLATELET COUNT: 143 10*9/L — ABNORMAL LOW (ref 150–440)
RED BLOOD CELL COUNT: 3.2 10*12/L — ABNORMAL LOW (ref 4.50–5.90)
RED CELL DISTRIBUTION WIDTH: 14.1 % (ref 12.0–15.0)
WBC ADJUSTED: 6.4 10*9/L (ref 4.5–11.0)

## 2018-04-15 LAB — COMPREHENSIVE METABOLIC PANEL
ALBUMIN: 2.8 g/dL — ABNORMAL LOW (ref 3.5–5.0)
ALKALINE PHOSPHATASE: 502 U/L — ABNORMAL HIGH (ref 38–126)
ALT (SGPT): 59 U/L — ABNORMAL HIGH (ref <50)
AST (SGOT): 68 U/L — ABNORMAL HIGH (ref 19–55)
BILIRUBIN TOTAL: 0.2 mg/dL (ref 0.0–1.2)
BLOOD UREA NITROGEN: 14 mg/dL (ref 7–21)
CALCIUM: 8.3 mg/dL — ABNORMAL LOW (ref 8.5–10.2)
CHLORIDE: 103 mmol/L (ref 98–107)
CO2: 28 mmol/L (ref 22.0–30.0)
CREATININE: 1.51 mg/dL — ABNORMAL HIGH (ref 0.70–1.30)
EGFR CKD-EPI AA MALE: 68 mL/min/{1.73_m2} (ref >=60)
EGFR CKD-EPI NON-AA MALE: 59 mL/min/{1.73_m2} — ABNORMAL LOW (ref >=60)
GLUCOSE RANDOM: 93 mg/dL (ref 65–179)
POTASSIUM: 4.5 mmol/L (ref 3.5–5.0)
SODIUM: 136 mmol/L (ref 135–145)

## 2018-04-15 LAB — PROTIME: Lab: 10.2

## 2018-04-15 LAB — GLUCOSE RANDOM: Glucose:MCnc:Pt:Ser/Plas:Qn:: 93

## 2018-04-15 LAB — MAGNESIUM: Magnesium:MCnc:Pt:Ser/Plas:Qn:: 1.6

## 2018-04-15 LAB — RED CELL DISTRIBUTION WIDTH: Lab: 14.1

## 2018-04-15 LAB — PROTIME-INR
INR: 0.89
PROTIME: 10.2 s (ref 10.2–13.1)

## 2018-04-15 LAB — ADRENOCORTICOTROPIC HORMONE: Corticotropin:MCnc:Pt:Plas:Qn:: 9.3

## 2018-04-15 NOTE — Unmapped (Signed)
Endocrinology Consult Follow Up Note    Requesting Attending Physician :  Blair Dolphin, *  Service Requesting Consult : Pulmonology (MDG)  Primary Care Provider: Dellis Filbert, MD  Outpatient Endocrinologist: N/A    Assessment/Recommendations:    Walter [name] is a 37 y.o. 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 I have been asked to see in consultation at the request of [name] MD for evaluation of CFRD.    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 Complicated by infection and noncompliance. Presented in DKA and now off IV insulin infusion. Has had variable glucoses due to late night eating/snacking and not asking for insulin coverage. Patient encouraged to ask for insulin prior to meals/snacks.    Recommendations are as follows given tighter control- reductions today.    -Lantus 36 units qhs  -Lispro 18 units with meals up to 5x per day to cover for late night meals. Hold if NPO, half dose if small meal  -Continue standard correction scale 2:50>150    Hyperkalemia- Differential is broad. 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 Adrenal insufficiency has been ruled out with ACTH stim from 5.3 to 22.8    - fludrocortisone empirically started to try and help. K is 4.5 now and was 4.1 before starting fludro- may not actually need this now that glucose controlled.    The patient was seen and discussed with Dr. Marcello Fennel.    We will continue to follow patient as needed. Please call/page [phone] with questions or concerns.     Elpidio Galea, MD  PGY4 Endocrinology Fellow    I saw and evaluated patient, participating in the critical and key portions of the service.  I discussed the findings, assessment, and plan with the resident/fellow, and agree with the resident/fellow's findings and plan as documented in the their note.  I was immediately available for the entirety of the of the history taking and examination, and present for key and critical portion thereof.    Thane Edu, MD, MPH  Attending - Endocrinology and Metabolism    ------------------------------------------------------------    History of Present Illness:     Reason for Consult: Hyperglycemia    Walter [name] is a 37 y.o. male admitted for Cystic fibrosis with pulmonary exacerbation (CMS-HCC). I have been asked to evaluate Walter [name] for CFRD. He has been started on IV antibiotics. He is not on any steroids for this exacerbation.     Interval history: Glucose well controlled, too tight actually. Received 40U lantus at 66U lispro. Felt low this morning and drank juice    History from initial encounter:  Patient is well known to the consult service from prior admissions. He was last seen by our team on 02/03/18 at which point he was receiving Lantus 30 units qhs, Lispro 14 units with meals and on a standard correction scale. His most recent A1c is 9.6% (01/12/18). Patient reports DM medication noncompliance as he was focusing on trying to control his CF. He acknowledged that he takes up to 18 units of lispro with meals and 30 units of lantus at home but is not consistent. He presented with BG >1000 and an elevated anion gap. He received 10 units of regular insulin overnight and 15 units of NOH this morning. He was briefly placed on IV insulin gtt and his anion gap closed. He reports good appetite. Thus, we will restart a prior known  in-hospital regimen that worked.    Past Medical History:    Medical History:    Past Medical History:   Diagnosis Date   ??? Alcohol abuse    ??? 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 Bipolar disorder (CMS-HCC)    ??? Chronic pancreatitis (CMS-HCC)    ??? Chronic sinusitis    ??? 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 Cystic fibrosis (CMS-HCC)    ??? Diabetes mellitus (CMS-HCC)     dx in-house 2015   ??? Disease of thyroid gland    ??? Hypertension    ??? Kidney stones    ??? Portal hypertension (CMS-HCC)        Surgical History:    Past Surgical History:   Procedure Laterality Date   ??? CHOLECYSTECTOMY  2008   ??? sinus surgery     ??? TONSILLECTOMY       Allergies:  Patient has no known allergies.    All Medications:   Current Facility-Administered Medications   Medication Dose Route Frequency Provider Last Rate Last Dose   ??? acetaminophen (TYLENOL) tablet 500 mg  500 mg Oral Q6H PRN Azucena Freed, MD       ??? albuterol 2.5 mg /3 mL (0.083 %) nebulizer solution 2.5 mg  2.5 mg Nebulization 4x Daily (RT) Mackey Birchwood, MD   2.5 mg at 04/14/18 1815   ??? busPIRone (BUSPAR) tablet 15 mg  15 mg Oral TID Mackey Birchwood, MD   15 mg at 04/14/18 2046   ??? ceftaroline (TEFLARO) 600 mg in sodium chloride (NS) 0.9 % 50 mL IVPB  600 mg Intravenous Q12H Mackey Birchwood, MD   Stopped at 04/15/18 0336   ??? cholecalciferol (vitamin D3) tablet 7,500 Units  7,500 Units Oral Daily Mackey Birchwood, MD   7,500 Units at 04/14/18 0347   ??? ciprofloxacin HCl (CIPRO) tablet 750 mg  750 mg Oral Q12H SCH Azucena Freed, MD   750 mg at 04/14/18 2046   ??? insulin regular (HumuLIN,NovoLIN) injection 10 Units  10 Units Intravenous Once Azucena Freed, MD   Stopped at 04/13/18 0920    Followed by   ??? dextrose (D10W) 10% bolus 125 mL  12.5 g Intravenous Once Azucena Freed, MD   Stopped at 04/13/18 0920   ??? dextrose (D10W) 10% bolus 250 mL  25 g Intravenous Q30 Min PRN Wyatt Mage, MD       ??? dornase alfa (PULMOZYME) 1 mg/mL nebulizer solution 2.5 mg  2.5 mg Inhalation Daily (RT) Mackey Birchwood, MD   2.5 mg at 04/14/18 1106   ??? fludrocortisone (FLORINEF) tablet 100 mcg  100 mcg Oral Daily Sharyon Medicus, MD   100 mcg at 04/14/18 1836   ??? folic acid (FOLVITE) tablet 1 mg  1 mg Oral Daily Mackey Birchwood, MD   1 mg at 04/14/18 0921   ??? heparin (porcine) injection 5,000 Units  5,000 Units Subcutaneous [facility] Mackey Birchwood, MD   5,000 Units at 04/10/18 2025   ??? heparin, porcine (PF) 100 unit/mL injection 200 Units  200 Units Intravenous Daily PRN Azucena Freed, MD       ??? hydrocortisone 1 % cream   Topical Daily PRN Mackey Birchwood, MD       ??? insulin glargine (LANTUS) injection 40 Units  40 Units Subcutaneous Nightly Azucena Freed, MD   40 Units at 04/14/18 2252   ??? insulin lispro (HumaLOG) injection 0-12 Units  0-12  Units Subcutaneous ACHS Wyatt Mage, MD   10 Units at 04/14/18 0917   ??? insulin lispro (HumaLOG) injection 20 Units  20 Units Subcutaneous 5XD insulin Wyatt Mage, MD   20 Units at 04/14/18 1842   ??? ketoconazole (NIZORAL) 2 % cream 1 application  1 application Topical Daily Mackey Birchwood, MD   1 application at 04/11/18 0925   ??? lactulose (CHRONULAC) oral solution (30 mL cup)  30 g Oral TID Wyatt Mage, MD   30 g at 04/14/18 2045   ??? lactulose (CHRONULAC) oral solution (30 mL cup)  20 g Oral Once PRN Wyatt Mage, MD       ??? lidocaine (LIDODERM) 5 % patch 2 patch  2 patch Transdermal Q24H Mackey Birchwood, MD   Stopped at 04/07/18 0941   ??? lipase-protease-amylase (ZENPEP) 20,000-63,000- 84,000 unit capsule, delayed release 240,000 units of lipase  12 capsule Oral 3xd Meals Mackey Birchwood, MD   240,000 units of lipase at 04/14/18 1842   ??? magnesium oxide (MAG-OX) tablet 800 mg  800 mg Oral BID Azucena Freed, MD   800 mg at 04/14/18 1836   ??? melatonin tablet 3 mg  3 mg Oral QPM Mackey Birchwood, MD   3 mg at 04/14/18 2046   ??? meropenem (MERREM) 2 g in sodium chloride (NS) 0.9 % 100 mL IVPB  2 g Intravenous Q12H Azucena Freed, MD   Stopped at 04/15/18 0456   ??? mirtazapine (REMERON) tablet 45 mg  45 mg Oral Nightly Mackey Birchwood, MD   45 mg at 04/14/18 2045   ??? MVW Complete (pediatric multivit 61-D3-vit K) 1,500-800 unit-mcg 2 capsule  2 capsule Oral Daily Mackey Birchwood, MD   2 capsule at 04/14/18 1143   ??? OLANZapine (ZYPREXA) tablet 15 mg  15 mg Oral Nightly Mackey Birchwood, MD   15 mg at 04/14/18 2045   ??? OLANZapine (ZYPREXA) tablet 2.5 mg  2.5 mg Oral BID PRN Azucena Freed, MD       ??? oxyCODONE (ROXICODONE) 5 MG immediate release tablet ??? pantoprazole (PROTONIX) EC tablet 40 mg  40 mg Oral BID with meals Mackey Birchwood, MD   40 mg at 04/14/18 1836   ??? phytonadione (vitamin K1) (MEPHYTON) tablet 5 mg  5 mg Oral Q W and Sa Mackey Birchwood, MD   5 mg at 04/13/18 1026   ??? polyethylene glycol (MIRALAX) packet 17 g  17 g Oral BID Mackey Birchwood, MD   17 g at 04/06/18 1610   ??? polyethylene glycol (MIRALAX) packet 17 g  17 g Oral TID PRN Mackey Birchwood, MD       ??? sodium chloride 7% nebulizer solution 4 mL  4 mL Nebulization 4x Daily (RT) Mackey Birchwood, MD   4 mL at 04/14/18 1815   ??? thiamine (B-1) tablet 200 mg  200 mg Oral Daily Mackey Birchwood, MD   200 mg at 04/14/18 0920   ??? traZODone (DESYREL) tablet 50 mg  50 mg Oral Nightly Mackey Birchwood, MD   50 mg at 04/14/18 2046       Social History:     Social History     Socioeconomic History   ??? Marital status: Single     Spouse name: None   ??? Number of children: 0   ??? Years of education: 12+   ??? Highest education level: Bachelor's degree (e.g., BA, AB, BS)   Occupational History   ??? Occupation: Restaurant manager, fast food  Comment: works at [employer]   ??? Financial resource strain: Very hard   ??? Food insecurity:     Worry: Often true     Inability: Often true   ??? Transportation needs:     Medical: No     Non-medical: No   Tobacco Use   ??? Smoking status: Former Smoker     Packs/day: 1.00     Years: 3.00     Pack years: 3.00     Types: Cigarettes   ??? Smokeless tobacco: Never Used   ??? Tobacco comment: reports quit prior to last admission   Substance and Sexual Activity   ??? Alcohol use: Yes     Alcohol/week: 0.0 standard drinks     Comment: A fifth of [place] per day    ??? Drug use: Not Currently     Comment: hx of alcohol and prescription pain medication abuse   ??? Sexual activity: Not Currently     Partners: Female   Lifestyle   ??? Physical activity:     Days per week: 0 days     Minutes per session: 0 min   ??? Stress: Very much   Relationships   ??? Social connections:     Talks on phone: Twice a week     Gets together: Once a week     Attends religious service: Never     Active member of club or organization: No     Attends meetings of clubs or organizations: Never     Relationship status: Never married   Other Topics Concern   ??? 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 Hillard Danker with [facility] in [place]    Current therapist: Nehemiah Settle with CF team        SUBSTANCE ABUSE HX:     # MJ     -Current use: once in awhile    # Alcohol     -Current use: Was sober from 2016 until two weeks ago. Admits to drinking enough to get drunk and admits to passing out. Denies history of complicated withdrawal. Admits to withdrawal symptoms of tremors, anxiety, headache, and tactile hallucinations at times.     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     Physical Exam:  Constitutional: alert, no acute distress, sleepy but arouses.  ENT: eyes closed, mucous membranes moist  Resp: clear to auscultation, symmetric air entry  MSK: no obvious joint deformity  Neurological: awake, oriented to person, place, and time, normal speech.  Lymph:no pedal edema  Skin: normal coloration and turgor, no rashes  Psych: flat affect    Test Results    Data Review  Lab Results   Component Value Date    POCGLU 133 04/15/2018    POCGLU 95 04/14/2018    POCGLU 135 04/14/2018    POCGLU 152 04/14/2018    POCGLU 62 (L) 04/14/2018    POCGLU 377 (H) 04/14/2018    POCGLU 237 (H) 04/13/2018       Lab Results   Component Value Date    A1C 13.3 (H) 04/06/2018    A1C 9.6 (H) 01/12/2018    A1C 11.9 (H) 11/17/2017     Lab Results   Component Value Date    GFR >= 60 10/31/2010    CREATININE 1.51 (H) 04/15/2018     Lab Results   Component Value Date    CHOL 116 01/29/2018     Lab Results   Component Value Date    HDL 61 (H) 01/29/2018     Lab Results   Component Value Date    LDL 35 (L) 01/29/2018     Lab Results   Component Value Date    TRIG 102 01/29/2018

## 2018-04-15 NOTE — Unmapped (Signed)
Patient AOX4, VSS throughout shift. All meds given on time as ordered. Pt denied pain and SOB. Pt ambulated in room and ate one dinner tray last night. Insulin lispro sliding scale not needed this shift. Early this morning pt called out for orange juice b/c he felt like his BG levels were low. Post OJ, BG 133. Pt said he felt better. Pt sleeping in bed. POC maintained. Will continue to monitor.    Problem: Adult Inpatient Plan of Care  Goal: Plan of Care Review  Outcome: Progressing  Goal: Patient-Specific Goal (Individualization)  Outcome: Progressing  Goal: Absence of Hospital-Acquired Illness or Injury  Outcome: Progressing  Goal: Optimal Comfort and Wellbeing  Outcome: Progressing  Goal: Readiness for Transition of Care  Outcome: Progressing  Goal: Rounds/Family Conference  Outcome: Progressing     Problem: Infection  Goal: Infection Symptom Resolution  Outcome: Progressing     Problem: Infection (Cystic Fibrosis)  Goal: Absence of Infection Signs/Symptoms  Outcome: Progressing     Problem: Respiratory Compromise (Cystic Fibrosis)  Goal: Effective Oxygenation and Ventilation  Outcome: Progressing     Problem: Diabetes Comorbidity  Goal: Blood Glucose Level Within Desired Range  Outcome: Progressing

## 2018-04-15 NOTE — Unmapped (Signed)
Daily Progress Note    24hr Events/Interval History:     NAEON, blood glucose much better controlled overnight after insulin titrations. Potassium is also normal today at 4.5. He has no complaints.     Assessment/Plan:    Walter [name] is a 37 y.o. male who presented to [facility] with Cystic fibrosis with pulmonary exacerbation (CMS-HCC) and hyperglycemia without DKA.    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 *      CF with acute pulmonary exacerbation:  CF sputum cultures positive with mucoid PsA, achromobacter. Mucoid PsA (S:Aztreonam, Ceftaz, Cipro, Zosyn, Tobra, I:Levo, R:Amikacin). PICC line in place.  - airway clearance with vest, HTS, albuterol,pulmozyme  - Continue meropenem/ceftaroline/cipro (10/22-11/5)     Hyperkalemia- etiology thought to be 2/2 recurrent episodes of hyperglycemia and CKD (baseline Cr around 1.5) vs type IV RTA. A stim test was performed and he demonstrated appropriate response with cortisol of 18 and 22 s/p cosyntropin. It is possible he has RTA in setting of CF and hx of IV tobramycin.  - Start florinef 50 mcg daily  -strict low K diet   -endocrine consult, appreciate recs    CF related DM, hyperglycemia: A1C 10/19 13.3%, persistent recurrences of hyperglycemia. Frequent snacking seems to play a role.   - decrease lantus to 36 units and lispro to 18 units TIDAC  -endo recs appreciated  ??  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  - LFTs daily  - CTM, abdominal ultrasound if changes in abdominal symptoms  - HCC and variceal screening on discharge  ??  CKD: likely has component of CKD at baseline with Cr fluctuating around 1.5. Stable today.  - CTM  - encourage po intake  - Per pharmacy, no dose adjustment to meropenem at this time  ??  Substance abuse: No recent concern for withdrawal. Holding sedating medications.   - CTM closely  - MVI, thiamine, folate  ??  Anxiety: struggles with anxiety and has a history of suicide attempt. We are continuing home meds which include Buspar and Trazodone. Psych consult with recs appreciated.   - holding gabapentin 2/2 asterixis and lethargy  ??  CF related pancreatic insufficiency: Zen pep regimen, vitamin regimen, vitamin K twice a week.  ??    Daily checklist:   Diet: low potassium  VTE ppx: heparin  GI ppx: PPI  Lytes: replete PRN to keep K>4 and Mag>2  IV Access: PIV  Code status: Full Code   Dispo: Likely inpatient for course of IV antibiotics  ___________________________________________________________________      Labs/Studies:  Labs and Studies from the last 24hrs per EMR and Reviewed          Objective:  Temp:  [36.6 ??C-37.2 ??C] 36.6 ??C  Heart Rate:  [76-89] 76  Resp:  [16-18] 18  BP: (137-161)/(75-105) 158/93  SpO2:  [93 %-96 %] 96 %    GENERAL: Chronically ill appearing gentleman sitting up in bed, NAD, resting comfortably  HEENT: EOMI  Cardiovascular: RRR, no murmurs  PULM: normal work of breathing, moving air bilaterally, no crackles, wheezing or rhonchi  ABDOMINAL: Soft, NT, ND  EXT: LUE PICC line intact  NEURO: Grossly intact    Walter Angelica, MD  Internal Medicine  PGY-2

## 2018-04-15 NOTE — Unmapped (Signed)
Patient A&O x4. VSS. On room air. Meds given as ordered. Denies pain. Patient had couple BM today. Family visited. No acute changes.   Problem: Adult Inpatient Plan of Care  Goal: Plan of Care Review  Outcome: Progressing  Goal: Patient-Specific Goal (Individualization)  Outcome: Progressing  Goal: Absence of Hospital-Acquired Illness or Injury  Outcome: Progressing  Goal: Optimal Comfort and Wellbeing  Outcome: Progressing  Goal: Readiness for Transition of Care  Outcome: Progressing  Goal: Rounds/Family Conference  Outcome: Progressing     Problem: Infection  Goal: Infection Symptom Resolution  Outcome: Progressing     Problem: Infection (Cystic Fibrosis)  Goal: Absence of Infection Signs/Symptoms  Outcome: Progressing     Problem: Respiratory Compromise (Cystic Fibrosis)  Goal: Effective Oxygenation and Ventilation  Outcome: Progressing     Problem: Diabetes Comorbidity  Goal: Blood Glucose Level Within Desired Range  Outcome: Progressing

## 2018-04-15 NOTE — Unmapped (Signed)
Patient did not get any of his respiratory treatments this shift, patient did not take his day shift breathing treatment, and did not wanted to wake up despite taking to him (pt. Very sleepy), will continue to monitor

## 2018-04-16 LAB — COMPREHENSIVE METABOLIC PANEL
ALBUMIN: 3.3 g/dL — ABNORMAL LOW (ref 3.5–5.0)
ALT (SGPT): 64 U/L — ABNORMAL HIGH (ref <50)
ANION GAP: 10 mmol/L (ref 7–15)
AST (SGOT): 71 U/L — ABNORMAL HIGH (ref 19–55)
BILIRUBIN TOTAL: 0.3 mg/dL (ref 0.0–1.2)
BLOOD UREA NITROGEN: 13 mg/dL (ref 7–21)
BUN / CREAT RATIO: 8
CHLORIDE: 103 mmol/L (ref 98–107)
CO2: 29 mmol/L (ref 22.0–30.0)
CREATININE: 1.54 mg/dL — ABNORMAL HIGH (ref 0.70–1.30)
EGFR CKD-EPI AA MALE: 67 mL/min/{1.73_m2} (ref >=60)
EGFR CKD-EPI NON-AA MALE: 58 mL/min/{1.73_m2} — ABNORMAL LOW (ref >=60)
GLUCOSE RANDOM: 91 mg/dL (ref 65–179)
POTASSIUM: 4.9 mmol/L (ref 3.5–5.0)
PROTEIN TOTAL: 6.4 g/dL — ABNORMAL LOW (ref 6.5–8.3)
SODIUM: 142 mmol/L (ref 135–145)

## 2018-04-16 LAB — CBC
HEMATOCRIT: 32 % — ABNORMAL LOW (ref 41.0–53.0)
MEAN CORPUSCULAR HEMOGLOBIN CONC: 32.1 g/dL (ref 31.0–37.0)
MEAN CORPUSCULAR HEMOGLOBIN: 28.7 pg (ref 26.0–34.0)
MEAN CORPUSCULAR VOLUME: 89.3 fL (ref 80.0–100.0)
MEAN PLATELET VOLUME: 7.5 fL (ref 7.0–10.0)
PLATELET COUNT: 176 10*9/L (ref 150–440)
RED BLOOD CELL COUNT: 3.58 10*12/L — ABNORMAL LOW (ref 4.50–5.90)
WBC ADJUSTED: 7.1 10*9/L (ref 4.5–11.0)

## 2018-04-16 LAB — MEAN CORPUSCULAR HEMOGLOBIN CONC: Lab: 32.1

## 2018-04-16 LAB — PROTIME-INR: INR: 0.93

## 2018-04-16 LAB — BLOOD UREA NITROGEN: Urea nitrogen:MCnc:Pt:Ser/Plas:Qn:: 13

## 2018-04-16 LAB — MAGNESIUM: Magnesium:MCnc:Pt:Ser/Plas:Qn:: 1.9

## 2018-04-16 LAB — INR: Lab: 0.93

## 2018-04-16 NOTE — Unmapped (Signed)
Endocrinology Consult Follow Up Note    Requesting Attending Physician :  Blair Dolphin, *  Service Requesting Consult : Pulmonology (MDG)  Primary Care Provider: Dellis Filbert, MD  Outpatient Endocrinologist: N/A    Assessment/Recommendations:    Walter [name] is a 37 y.o. 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 I have been asked to see in consultation at the request of [name] MD for evaluation of CFRD.    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 Complicated by infection and noncompliance. Presented in DKA and now off IV insulin infusion. Has had variable glucoses due to late night eating/snacking and not asking for insulin coverage. Patient encouraged to ask for insulin prior to meals/snacks.    Recommendations are as follows given tighter control- reductions today.    -Lantus 32 units qhs  -Lispro 14 units with meals up to 5x per day to cover for late night meals. Hold if NPO, half dose if small meal  -Continue standard correction scale 2:50>150    Hyperkalemia- Differential is broad. 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 Adrenal insufficiency has been ruled out with ACTH stim from 5.3 to 22.8    - fludrocortisone empirically started to try and help. K is 4.5 now and was 4.1 before starting fludro- may not actually need this now that glucose controlled.    The patient was seen and discussed with Dr. Cory Roughen    We will continue to follow patient as needed. Please call/page [phone] with questions or concerns.     Elpidio Galea, MD  PGY4 Endocrinology Fellow    I saw and evaluated patient, participating in the critical and key portions of the service.  I discussed the findings, assessment, and plan with the resident/fellow, and agree with the resident/fellow's findings and plan as documented in the their note.  I was immediately available for the entirety of the of the history taking and examination, and present for key and critical portion thereof.    Thane Edu, MD, MPH  Attending - Endocrinology and Metabolism    ------------------------------------------------------------    History of Present Illness:     Reason for Consult: Hyperglycemia    Walter [name] is a 37 y.o. male admitted for Cystic fibrosis with pulmonary exacerbation (CMS-HCC). I have been asked to evaluate [facility] for CFRD. He has been started on IV antibiotics. He is not on any steroids for this exacerbation.     Interval history: Glucose control still too tight. He got 36U glargine and 18U lispro yesterday. AM fasting today was 60. He felt the low. Eating much better he says    History from initial encounter:  Patient is well known to the consult service from prior admissions. He was last seen by our team on 02/03/18 at which point he was receiving Lantus 30 units qhs, Lispro 14 units with meals and on a standard correction scale. His most recent A1c is 9.6% (01/12/18). Patient reports DM medication noncompliance as he was focusing on trying to control his CF. He acknowledged that he takes up to 18 units of lispro with meals and 30 units of lantus at home but is not consistent. He presented with BG >1000 and an elevated anion gap. He received 10 units of regular insulin overnight and 15 units of NOH this morning. He was briefly placed on IV insulin gtt and his anion gap closed. He reports good  appetite. Thus, we will restart a prior known in-hospital regimen that worked.    Past Medical History:    Medical History:    Past Medical History:   Diagnosis Date   ??? Alcohol abuse    ??? 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 Bipolar disorder (CMS-HCC)    ??? Chronic pancreatitis (CMS-HCC)    ??? Chronic sinusitis    ??? 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 Cystic fibrosis (CMS-HCC)    ??? Diabetes mellitus (CMS-HCC)     dx in-house 2015   ??? Disease of thyroid gland    ??? Hypertension    ??? Kidney stones    ??? Portal hypertension (CMS-HCC)        Surgical History:    Past Surgical History:   Procedure Laterality Date   ??? CHOLECYSTECTOMY  2008   ??? sinus surgery     ??? TONSILLECTOMY       Allergies:  Patient has no known allergies.    All Medications:   Current Facility-Administered Medications   Medication Dose Route Frequency Provider Last Rate Last Dose   ??? acetaminophen (TYLENOL) tablet 500 mg  500 mg Oral Q6H PRN Azucena Freed, MD       ??? albuterol 2.5 mg /3 mL (0.083 %) nebulizer solution 2.5 mg  2.5 mg Nebulization 4x Daily (RT) Mackey Birchwood, MD   2.5 mg at 04/15/18 1516   ??? busPIRone (BUSPAR) tablet 15 mg  15 mg Oral TID Mackey Birchwood, MD   15 mg at 04/16/18 0826   ??? ceftaroline (TEFLARO) 600 mg in sodium chloride (NS) 0.9 % 50 mL IVPB  600 mg Intravenous Q12H Mackey Birchwood, MD   Stopped at 04/16/18 0332   ??? cholecalciferol (vitamin D3) tablet 7,500 Units  7,500 Units Oral Daily Mackey Birchwood, MD   7,500 Units at 04/16/18 0826   ??? ciprofloxacin HCl (CIPRO) tablet 750 mg  750 mg Oral Q12H SCH Darliss Cheney Nugent, MD   750 mg at 04/16/18 0826   ??? insulin regular (HumuLIN,NovoLIN) injection 10 Units  10 Units Intravenous Once Azucena Freed, MD   Stopped at 04/13/18 0920    Followed by   ??? dextrose (D10W) 10% bolus 125 mL  12.5 g Intravenous Once Azucena Freed, MD   Stopped at 04/13/18 0920   ??? dextrose (D10W) 10% bolus 250 mL  25 g Intravenous Q30 Min PRN Wyatt Mage, MD       ??? dornase alfa (PULMOZYME) 1 mg/mL nebulizer solution 2.5 mg  2.5 mg Inhalation Daily (RT) Mackey Birchwood, MD   2.5 mg at 04/15/18 1129   ??? fludrocortisone (FLORINEF) tablet 50 mcg  50 mcg Oral Daily Wyatt Mage, MD   50 mcg at 04/16/18 0826   ??? folic acid (FOLVITE) tablet 1 mg  1 mg Oral Daily Mackey Birchwood, MD   1 mg at 04/16/18 0826   ??? heparin (porcine) injection 5,000 Units  5,000 Units Subcutaneous [facility] Mackey Birchwood, MD   5,000 Units at 04/10/18 2025   ??? heparin, porcine (PF) 100 unit/mL injection 200 Units  200 Units Intravenous Daily PRN Azucena Freed, MD       ??? hydrocortisone 1 % cream   Topical Daily PRN Mackey Birchwood, MD       ??? insulin glargine (LANTUS) injection 36 Units  36 Units Subcutaneous Nightly Sharyon Medicus, MD   36 Units at 04/15/18 2054   ???  insulin lispro (HumaLOG) injection 0-12 Units  0-12 Units Subcutaneous ACHS Wyatt Mage, MD   10 Units at 04/14/18 0917   ??? insulin lispro (HumaLOG) injection 18 Units  18 Units Subcutaneous 5XD insulin Sharyon Medicus, MD   Stopped at 04/15/18 2100   ??? ketoconazole (NIZORAL) 2 % cream 1 application  1 application Topical Daily Mackey Birchwood, MD   1 application at 04/11/18 0925   ??? lactulose (CHRONULAC) oral solution (30 mL cup)  30 g Oral TID Wyatt Mage, MD   30 g at 04/16/18 0825   ??? lactulose (CHRONULAC) oral solution (30 mL cup)  20 g Oral Once PRN Wyatt Mage, MD       ??? lidocaine (LIDODERM) 5 % patch 2 patch  2 patch Transdermal Q24H Mackey Birchwood, MD   Stopped at 04/07/18 0941   ??? lipase-protease-amylase (ZENPEP) 20,000-63,000- 84,000 unit capsule, delayed release 240,000 units of lipase  12 capsule Oral 3xd Meals Mackey Birchwood, MD   240,000 units of lipase at 04/15/18 1336   ??? magnesium oxide (MAG-OX) tablet 800 mg  800 mg Oral BID Azucena Freed, MD   800 mg at 04/15/18 1831   ??? melatonin tablet 3 mg  3 mg Oral QPM Mackey Birchwood, MD   3 mg at 04/15/18 2100   ??? meropenem (MERREM) 2 g in sodium chloride (NS) 0.9 % 100 mL IVPB  2 g Intravenous Q12H Azucena Freed, MD 150 mL/hr at 04/16/18 0406 2 g at 04/16/18 0406   ??? mirtazapine (REMERON) tablet 45 mg  45 mg Oral Nightly Mackey Birchwood, MD   45 mg at 04/15/18 2043   ??? MVW Complete (pediatric multivit 61-D3-vit K) 1,500-800 unit-mcg 2 capsule  2 capsule Oral Daily Mackey Birchwood, MD   2 capsule at 04/15/18 1114   ??? OLANZapine (ZYPREXA) tablet 15 mg  15 mg Oral Nightly Mackey Birchwood, MD   15 mg at 04/15/18 2044   ??? OLANZapine (ZYPREXA) tablet 2.5 mg  2.5 mg Oral BID PRN Azucena Freed, MD ??? oxyCODONE (ROXICODONE) 5 MG immediate release tablet            ??? pantoprazole (PROTONIX) EC tablet 40 mg  40 mg Oral BID with meals Mackey Birchwood, MD   40 mg at 04/16/18 0826   ??? phytonadione (vitamin K1) (MEPHYTON) tablet 5 mg  5 mg Oral Q W and Sa Mackey Birchwood, MD   5 mg at 04/16/18 0825   ??? polyethylene glycol (MIRALAX) packet 17 g  17 g Oral BID Mackey Birchwood, MD   17 g at 04/06/18 1610   ??? polyethylene glycol (MIRALAX) packet 17 g  17 g Oral TID PRN Mackey Birchwood, MD       ??? sodium chloride 7% nebulizer solution 4 mL  4 mL Nebulization 4x Daily (RT) Mackey Birchwood, MD   4 mL at 04/15/18 1516   ??? thiamine (B-1) tablet 200 mg  200 mg Oral Daily Mackey Birchwood, MD   200 mg at 04/16/18 0825   ??? traZODone (DESYREL) tablet 50 mg  50 mg Oral Nightly Mackey Birchwood, MD   50 mg at 04/15/18 2043       Social History:     Social History     Socioeconomic History   ??? Marital status: Single     Spouse name: None   ??? Number of children: 0   ??? Years of education: 12+   ??? Highest education level: Bachelor's degree (e.g., BA,  AB, BS)   Occupational History   ??? Occupation: Restaurant manager, fast food     Comment: works at [employer]   ??? Financial resource strain: Very hard   ??? Food insecurity:     Worry: Often true     Inability: Often true   ??? Transportation needs:     Medical: No     Non-medical: No   Tobacco Use   ??? Smoking status: Former Smoker     Packs/day: 1.00     Years: 3.00     Pack years: 3.00     Types: Cigarettes   ??? Smokeless tobacco: Never Used   ??? Tobacco comment: reports quit prior to last admission   Substance and Sexual Activity   ??? Alcohol use: Yes     Alcohol/week: 0.0 standard drinks     Comment: A fifth of [place] per day    ??? Drug use: Not Currently     Comment: hx of alcohol and prescription pain medication abuse   ??? Sexual activity: Not Currently     Partners: Female   Lifestyle   ??? Physical activity:     Days per week: 0 days     Minutes per session: 0 min   ??? Stress: Very much   Relationships   ??? Social connections: Talks on phone: Twice a week     Gets together: Once a week     Attends religious service: Never     Active member of club or organization: No     Attends meetings of clubs or organizations: Never     Relationship status: Never married   Other Topics Concern   ??? 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 Hillard Danker with [facility] in [place]    Current therapist: Nehemiah Settle with CF team        SUBSTANCE ABUSE HX:     # MJ     -Current use: once in awhile    # Alcohol     -Current use: Was sober from 2016 until two weeks ago. Admits to drinking enough to get drunk and admits to passing out. Denies history of complicated withdrawal. Admits to withdrawal symptoms of tremors, anxiety, headache, and tactile hallucinations at times.     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     Physical Exam:  Constitutional: alert, no acute distress, sleepy but arouses.  ENT: eyes closed, mucous membranes moist  Resp: clear to auscultation, symmetric air entry  MSK: no obvious joint deformity  Neurological: awake, oriented to person, place, and time, normal speech.  Lymph:no pedal edema  Skin: normal coloration and turgor, no rashes  Psych: flat affect    Test Results    Data Review  Lab Results   Component Value Date    POCGLU 60 (L) 04/16/2018    POCGLU 130 04/15/2018    POCGLU 111 04/15/2018    POCGLU 137 04/15/2018    POCGLU 104 04/15/2018    POCGLU 68 04/15/2018    POCGLU 133 04/15/2018       Lab Results   Component Value Date    A1C 13.3 (H) 04/06/2018    A1C 9.6 (H) 01/12/2018    A1C 11.9 (H) 11/17/2017     Lab Results   Component Value Date    GFR >= 60 10/31/2010    CREATININE 1.54 (H) 04/16/2018     Lab Results   Component Value Date    CHOL 116 01/29/2018     Lab Results   Component Value Date    HDL 61 (H) 01/29/2018     Lab Results   Component Value Date    LDL 35 (L) 01/29/2018     Lab Results   Component Value Date    TRIG 102 01/29/2018

## 2018-04-16 NOTE — Unmapped (Signed)
Pt A&Ox4. VSS. Pt was given all medications and abx per MAR. Pt tolerated well. No coverage for BG needed during shift. Pt reported having multiple BM's during shift. Pt up ad lib in room. No further needs assessed. POC continues.   Problem: Adult Inpatient Plan of Care  Goal: Plan of Care Review  Outcome: Progressing  Goal: Patient-Specific Goal (Individualization)  Outcome: Progressing  Goal: Absence of Hospital-Acquired Illness or Injury  Outcome: Progressing  Goal: Optimal Comfort and Wellbeing  Outcome: Progressing  Goal: Readiness for Transition of Care  Outcome: Progressing  Goal: Rounds/Family Conference  Outcome: Progressing     Problem: Infection  Goal: Infection Symptom Resolution  Outcome: Progressing     Problem: Infection (Cystic Fibrosis)  Goal: Absence of Infection Signs/Symptoms  Outcome: Progressing     Problem: Respiratory Compromise (Cystic Fibrosis)  Goal: Effective Oxygenation and Ventilation  Outcome: Progressing     Problem: Diabetes Comorbidity  Goal: Blood Glucose Level Within Desired Range  Outcome: Progressing

## 2018-04-16 NOTE — Unmapped (Signed)
VSS, BSGC's have been stable. Patient tolerated oral medications well. Patient tolerated IV medication well without issue. Patient ate dinner before this shift and did not eat another meal the remainder of the shift.  Patient has been asleep most of the shift, remains easily awakened. He has not voiced any concerns or complaints at this time. Will continue to monitor patient and continue POC.   Problem: Adult Inpatient Plan of Care  Goal: Plan of Care Review  Outcome: Ongoing - Unchanged  Goal: Patient-Specific Goal (Individualization)  Outcome: Ongoing - Unchanged  Goal: Absence of Hospital-Acquired Illness or Injury  Outcome: Ongoing - Unchanged  Goal: Optimal Comfort and Wellbeing  Outcome: Ongoing - Unchanged  Goal: Readiness for Transition of Care  Outcome: Ongoing - Unchanged  Goal: Rounds/Family Conference  Outcome: Ongoing - Unchanged     Problem: Infection  Goal: Infection Symptom Resolution  Outcome: Ongoing - Unchanged     Problem: Infection (Cystic Fibrosis)  Goal: Absence of Infection Signs/Symptoms  Outcome: Ongoing - Unchanged     Problem: Respiratory Compromise (Cystic Fibrosis)  Goal: Effective Oxygenation and Ventilation  Outcome: Ongoing - Unchanged     Problem: Diabetes Comorbidity  Goal: Blood Glucose Level Within Desired Range  Outcome: Ongoing - Unchanged

## 2018-04-16 NOTE — Unmapped (Signed)
Daily Progress Note    24hr Events/Interval History:     NAEON. Fe is feeling sleep his morning. His blood glucoses are better controlled in the 100s, this morning episode of BS of 60. Potassium this morning of 4.9.    Assessment/Plan:    Walter [name] is a 37 y.o. male who presented to [facility] with Cystic fibrosis with pulmonary exacerbation (CMS-HCC) and hyperglycemia without DKA.    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 *      CF with acute pulmonary exacerbation:  CF sputum cultures positive with mucoid PsA, achromobacter. Mucoid PsA (S:Aztreonam, Ceftaz, Cipro, Zosyn, Tobra, I:Levo, R:Amikacin). PICC line in place.  - airway clearance with vest, HTS, albuterol,pulmozyme  - Continue meropenem/ceftaroline/cipro (10/22-11/5)   - repeat PFT 11/4 to determine if needs additional abx course    Hyperkalemia- etiology thought to be 2/2 recurrent episodes of hyperglycemia and CKD (baseline Cr around 1.5) vs type IV RTA. A stim test was performed and he demonstrated appropriate response with cortisol of 18 and 22 s/p cosyntropin. It is possible he has RTA in setting of CF and hx of IV tobramycin. On Florinef low dose, K of 4.9 with glucose control so opt to continue Florinef for now. If potassium is decreased and remains wnl will discuss trial off Florinef.  -strict low K diet -endocrine consult, appreciate recs    CF related DM, hyperglycemia: A1C 10/19 13.3%, persistent recurrences of hyperglycemia. Frequent snacking seems to play a role, with adjustments glucose better controlled however low this morning in 60-70s.  - decrease lantus to 32 units and lispro to 14 units TIDAC  - endo recs appreciated  ??  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  - LFTs daily  - CTM, abdominal ultrasound if changes in abdominal symptoms  - HCC and variceal screening on discharge  ??  CKD: likely has component of CKD at baseline with Cr fluctuating around 1.5. Stable today.  - CTM  - encourage po intake  - Per pharmacy, no dose adjustment to meropenem at this time  ??  Substance abuse: No recent concern for withdrawal. Holding sedating medications.   - CTM closely  - MVI, thiamine, folate  ??  Anxiety: struggles with anxiety and has a history of suicide attempt. We are continuing home meds which include Buspar and Trazodone. Psych consult with recs appreciated.   - holding gabapentin 2/2 asterixis and lethargy  ??  CF related pancreatic insufficiency: Zen pep regimen, vitamin regimen, vitamin K twice a week.  ??    Daily checklist:   Diet: low potassium  VTE ppx: heparin  GI ppx: PPI  Lytes: replete PRN to keep K>4 and Mag>2  IV Access: PIV  Code status: Full Code   Dispo: Likely inpatient for course of IV antibiotics  ___________________________________________________________________  Labs/Studies:  Labs and Studies from the last 24hrs per EMR and Reviewed          Objective:  Temp:  [36.1 ??C-36.8 ??C] 36.7 ??C  Heart Rate:  [67-86] 67  Resp:  [18] 18  BP: (152-156)/(82-97) 156/89  SpO2:  [94 %-98 %] 98 %    GENERAL: Chronically ill appearing gentleman laying in bed, NAD, resting comfortably  Cardiovascular: RRR, no murmurs  PULM: normal work of breathing, moving air bilaterally, no crackles, wheezing or rhonchi  ABDOMINAL: Soft, NT, ND  EXT: LUE PICC line intact  NEURO: Grossly intact    Azucena Freed, MD

## 2018-04-16 NOTE — Unmapped (Signed)
Problem: Respiratory Compromise (Cystic Fibrosis)  Goal: Effective Oxygenation and Ventilation  Outcome: Not Progressing   Patient refused all of his respiratory treatments as well as his airway clearance this shift, neb cup was still full from day shift, will continue to monitor

## 2018-04-16 NOTE — Unmapped (Signed)
Pt A&Ox4. VSS. Pt was given all medications per [facility] and tolerated well. Pt was educated on going off the unit, and was told to wear a mask when walking around the unit. Pt non compliant, and walked off the unit to [employer]. Pt returned safely. No further needs assessed during shift. POC continues.   Problem: Adult Inpatient Plan of Care  Goal: Plan of Care Review  Outcome: Progressing  Goal: Patient-Specific Goal (Individualization)  Outcome: Progressing  Goal: Absence of Hospital-Acquired Illness or Injury  Outcome: Progressing  Goal: Optimal Comfort and Wellbeing  Outcome: Progressing  Goal: Readiness for Transition of Care  Outcome: Progressing  Goal: Rounds/Family Conference  Outcome: Progressing     Problem: Infection  Goal: Infection Symptom Resolution  Outcome: Progressing     Problem: Infection (Cystic Fibrosis)  Goal: Absence of Infection Signs/Symptoms  Outcome: Progressing     Problem: Respiratory Compromise (Cystic Fibrosis)  Goal: Effective Oxygenation and Ventilation  Outcome: Progressing     Problem: Diabetes Comorbidity  Goal: Blood Glucose Level Within Desired Range  Outcome: Progressing

## 2018-04-16 NOTE — Unmapped (Signed)
Patient refused assessment today and states will self administer medications and [place].  At each scheduled treatment time the prior dose was observed remaining in nebs.  Fresh med was supplied at 1500 with assurance from patient he would self administer after finishing phone call.  At 1830, patient stated he had not completed the 1500 treatment, declined 1900, and would finish 1500 dose.  No breathing treatments or airway clearance were observed.  Will continue to monitor.

## 2018-04-17 LAB — COMPREHENSIVE METABOLIC PANEL
ALBUMIN: 3.1 g/dL — ABNORMAL LOW (ref 3.5–5.0)
ALKALINE PHOSPHATASE: 471 U/L — ABNORMAL HIGH (ref 38–126)
ALT (SGPT): 57 U/L — ABNORMAL HIGH (ref <50)
ANION GAP: 8 mmol/L (ref 7–15)
BILIRUBIN TOTAL: 0.2 mg/dL (ref 0.0–1.2)
BLOOD UREA NITROGEN: 14 mg/dL (ref 7–21)
BUN / CREAT RATIO: 8
CALCIUM: 8.5 mg/dL (ref 8.5–10.2)
CHLORIDE: 102 mmol/L (ref 98–107)
CO2: 29 mmol/L (ref 22.0–30.0)
CREATININE: 1.65 mg/dL — ABNORMAL HIGH (ref 0.70–1.30)
EGFR CKD-EPI AA MALE: 61 mL/min/{1.73_m2} (ref >=60)
EGFR CKD-EPI NON-AA MALE: 53 mL/min/{1.73_m2} — ABNORMAL LOW (ref >=60)
GLUCOSE RANDOM: 59 mg/dL — ABNORMAL LOW (ref 65–179)
POTASSIUM: 5 mmol/L (ref 3.5–5.0)
PROTEIN TOTAL: 6.1 g/dL — ABNORMAL LOW (ref 6.5–8.3)
SODIUM: 139 mmol/L (ref 135–145)

## 2018-04-17 LAB — CBC
HEMATOCRIT: 30.2 % — ABNORMAL LOW (ref 41.0–53.0)
MEAN CORPUSCULAR HEMOGLOBIN CONC: 32.9 g/dL (ref 31.0–37.0)
MEAN CORPUSCULAR HEMOGLOBIN: 29.1 pg (ref 26.0–34.0)
MEAN CORPUSCULAR VOLUME: 88.3 fL (ref 80.0–100.0)
MEAN PLATELET VOLUME: 8.2 fL (ref 7.0–10.0)
PLATELET COUNT: 177 10*9/L (ref 150–440)
RED BLOOD CELL COUNT: 3.42 10*12/L — ABNORMAL LOW (ref 4.50–5.90)
RED CELL DISTRIBUTION WIDTH: 14.1 % (ref 12.0–15.0)
WBC ADJUSTED: 6.3 10*9/L (ref 4.5–11.0)

## 2018-04-17 LAB — PROTIME-INR: INR: 1.01

## 2018-04-17 LAB — HEMOGLOBIN: Lab: 9.9 — ABNORMAL LOW

## 2018-04-17 LAB — BLOOD UREA NITROGEN: Urea nitrogen:MCnc:Pt:Ser/Plas:Qn:: 14

## 2018-04-17 LAB — MAGNESIUM: Magnesium:MCnc:Pt:Ser/Plas:Qn:: 1.7

## 2018-04-17 LAB — INR: Lab: 1.01

## 2018-04-17 NOTE — Unmapped (Signed)
VSS, HS-BSGC stable. Patient has been in room entire shift. At beginning of shift, went over the plan for the shift with Patient. Asked Patient to call/alert staff if he received another meal during the evening/night prior to eating so that he can receive his proper dose of insulin. Patient agreed to do this initially, but ultimately did not comply as an eaten meal tray was observed in room. Patient currently asleep in bed. Will continue to monitor patient and continue POC.   Problem: Adult Inpatient Plan of Care  Goal: Plan of Care Review  Outcome: Ongoing - Unchanged  Goal: Patient-Specific Goal (Individualization)  Outcome: Ongoing - Unchanged  Goal: Absence of Hospital-Acquired Illness or Injury  Outcome: Ongoing - Unchanged  Goal: Optimal Comfort and Wellbeing  Outcome: Ongoing - Unchanged  Goal: Readiness for Transition of Care  Outcome: Ongoing - Unchanged  Goal: Rounds/Family Conference  Outcome: Ongoing - Unchanged     Problem: Infection  Goal: Infection Symptom Resolution  Outcome: Ongoing - Unchanged     Problem: Infection (Cystic Fibrosis)  Goal: Absence of Infection Signs/Symptoms  Outcome: Ongoing - Unchanged     Problem: Respiratory Compromise (Cystic Fibrosis)  Goal: Effective Oxygenation and Ventilation  Outcome: Ongoing - Unchanged     Problem: Diabetes Comorbidity  Goal: Blood Glucose Level Within Desired Range  Outcome: Ongoing - Unchanged

## 2018-04-17 NOTE — Unmapped (Signed)
Endocrinology Consult Follow Up Note    Requesting Attending Physician :  Blair Dolphin, *  Service Requesting Consult : Pulmonology (MDG)  Primary Care Provider: Dellis Filbert, MD  Outpatient Endocrinologist: N/A    Assessment/Recommendations:    Walter [name] is a 37 y.o. 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 I have been asked to see in consultation at the request of [name] MD for evaluation of CFRD.    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 Complicated by infection and noncompliance. Presented in DKA and now off IV insulin infusion. Has had variable glucoses due to late night eating/snacking and not asking for insulin coverage. Patient encouraged to ask for insulin prior to meals/snacks.    Recommendations are as follows given tighter control- reductions today.    -Lantus 28 units qhs  -Lispro 14 units with meals up to 5x per day to cover for late night meals. Hold if NPO, half dose if small meal  -Continue standard correction scale 2:50>150    Hyperkalemia- Differential is broad. 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 Adrenal insufficiency has been ruled out with ACTH stim from 5.3 to 22.8    - fludrocortisone empirically started to try and help. K is 5 now and was 4.1 before starting fludro- may not actually need this now that glucose controlled.    The patient was seen and discussed with Dr. Cory Roughen    We will continue to follow patient as needed. Please call/page [phone] with questions or concerns.     Elpidio Galea, MD  PGY4 Endocrinology Fellow    ------------------------------------------------------------    History of Present Illness:     Reason for Consult: Hyperglycemia    Walter [name] is a 37 y.o. male admitted for Cystic fibrosis with pulmonary exacerbation (CMS-HCC). I have been asked to evaluate [facility] for CFRD. He has been started on IV antibiotics. He is not on any steroids for this exacerbation.     Interval history: Glucose control still too tight. He got 32U glargine and 24U lispro yesterday. AM fasting today was 59.  He did have a fairly large prandial excursion where he did not get insulin in the afternoon but with dinner it appropriately corrected.    History from initial encounter:  Patient is well known to the consult service from prior admissions. He was last seen by our team on 02/03/18 at which point he was receiving Lantus 30 units qhs, Lispro 14 units with meals and on a standard correction scale. His most recent A1c is 9.6% (01/12/18). Patient reports DM medication noncompliance as he was focusing on trying to control his CF. He acknowledged that he takes up to 18 units of lispro with meals and 30 units of lantus at home but is not consistent. He presented with BG >1000 and an elevated anion gap. He received 10 units of regular insulin overnight and 15 units of NOH this morning. He was briefly placed on IV insulin gtt and his anion gap closed. He reports good appetite. Thus, we will restart a prior known in-hospital regimen that worked.    Past Medical History:    Medical History:    Past Medical History:   Diagnosis Date   ??? Alcohol abuse    ??? 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 Bipolar disorder (CMS-HCC)    ??? Chronic pancreatitis (CMS-HCC)    ???  Chronic sinusitis    ??? 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 Cystic fibrosis (CMS-HCC)    ??? Diabetes mellitus (CMS-HCC)     dx in-house 2015   ??? Disease of thyroid gland    ??? Hypertension    ??? Kidney stones    ??? Portal hypertension (CMS-HCC)        Surgical History:    Past Surgical History:   Procedure Laterality Date   ??? CHOLECYSTECTOMY  2008   ??? sinus surgery     ??? TONSILLECTOMY       Allergies:  Patient has no known allergies.    All Medications:   Current Facility-Administered Medications   Medication Dose Route Frequency Provider Last Rate Last Dose   ??? acetaminophen (TYLENOL) tablet 500 mg  500 mg Oral Q6H PRN Azucena Freed, MD       ??? albuterol 2.5 mg /3 mL (0.083 %) nebulizer solution 2.5 mg  2.5 mg Nebulization 4x Daily (RT) Mackey Birchwood, MD   2.5 mg at 04/16/18 1849   ??? busPIRone (BUSPAR) tablet 15 mg  15 mg Oral TID Mackey Birchwood, MD   15 mg at 04/16/18 2040   ??? ceftaroline (TEFLARO) 600 mg in sodium chloride (NS) 0.9 % 50 mL IVPB  600 mg Intravenous Q12H Mackey Birchwood, MD 308 mL/hr at 04/17/18 0330 600 mg at 04/17/18 0330   ??? cholecalciferol (vitamin D3) tablet 7,500 Units  7,500 Units Oral Daily Mackey Birchwood, MD   7,500 Units at 04/16/18 0826   ??? ciprofloxacin HCl (CIPRO) tablet 750 mg  750 mg Oral Q12H SCH Azucena Freed, MD   750 mg at 04/16/18 2040   ??? insulin regular (HumuLIN,NovoLIN) injection 10 Units  10 Units Intravenous Once Azucena Freed, MD   Stopped at 04/13/18 0920    Followed by   ??? dextrose (D10W) 10% bolus 125 mL  12.5 g Intravenous Once Azucena Freed, MD   Stopped at 04/13/18 0920   ??? dextrose (D10W) 10% bolus 250 mL  25 g Intravenous Q30 Min PRN Wyatt Mage, MD       ??? dornase alfa (PULMOZYME) 1 mg/mL nebulizer solution 2.5 mg  2.5 mg Inhalation Daily (RT) Mackey Birchwood, MD   2.5 mg at 04/16/18 1057   ??? fludrocortisone (FLORINEF) tablet 50 mcg  50 mcg Oral Daily Wyatt Mage, MD   50 mcg at 04/16/18 0826   ??? folic acid (FOLVITE) tablet 1 mg  1 mg Oral Daily Mackey Birchwood, MD   1 mg at 04/16/18 0826   ??? heparin (porcine) injection 5,000 Units  5,000 Units Subcutaneous [facility] Mackey Birchwood, MD   5,000 Units at 04/10/18 2025   ??? heparin, porcine (PF) 100 unit/mL injection 200 Units  200 Units Intravenous Daily PRN Azucena Freed, MD       ??? hydrocortisone 1 % cream   Topical Daily PRN Mackey Birchwood, MD       ??? insulin glargine (LANTUS) injection 32 Units  32 Units Subcutaneous Nightly Azucena Freed, MD   32 Units at 04/16/18 2040   ??? insulin lispro (HumaLOG) injection 0-12 Units  0-12 Units Subcutaneous ACHS Wyatt Mage, MD   2 Units at 04/16/18 2100   ??? insulin lispro (HumaLOG) injection 14 Units  14 Units Subcutaneous 5XD insulin Azucena Freed, MD   Stopped at 04/16/18 2301   ??? ketoconazole (NIZORAL) 2 % cream 1 application  1 application Topical Daily  Mackey Birchwood, MD   1 application at 04/11/18 0925   ??? lactulose (CHRONULAC) oral solution (30 mL cup)  20 g Oral Once PRN Wyatt Mage, MD       ??? lidocaine (LIDODERM) 5 % patch 2 patch  2 patch Transdermal Q24H Mackey Birchwood, MD   Stopped at 04/07/18 0941   ??? lipase-protease-amylase (ZENPEP) 20,000-63,000- 84,000 unit capsule, delayed release 240,000 units of lipase  12 capsule Oral 3xd Meals Mackey Birchwood, MD   240,000 units of lipase at 04/16/18 1838   ??? magnesium oxide (MAG-OX) tablet 800 mg  800 mg Oral BID Azucena Freed, MD   800 mg at 04/16/18 1719   ??? melatonin tablet 3 mg  3 mg Oral QPM Mackey Birchwood, MD   3 mg at 04/16/18 2040   ??? meropenem (MERREM) 2 g in sodium chloride (NS) 0.9 % 100 mL IVPB  2 g Intravenous Q12H Azucena Freed, MD 150 mL/hr at 04/17/18 0428 2 g at 04/17/18 0428   ??? mirtazapine (REMERON) tablet 45 mg  45 mg Oral Nightly Mackey Birchwood, MD   45 mg at 04/16/18 2040   ??? MVW Complete (pediatric multivit 61-D3-vit K) 1,500-800 unit-mcg 2 capsule  2 capsule Oral Daily Mackey Birchwood, MD   2 capsule at 04/16/18 1416   ??? OLANZapine (ZYPREXA) tablet 15 mg  15 mg Oral Nightly Mackey Birchwood, MD   15 mg at 04/16/18 2039   ??? OLANZapine (ZYPREXA) tablet 2.5 mg  2.5 mg Oral BID PRN Azucena Freed, MD       ??? oxyCODONE (ROXICODONE) 5 MG immediate release tablet            ??? pantoprazole (PROTONIX) EC tablet 40 mg  40 mg Oral BID with meals Mackey Birchwood, MD   40 mg at 04/16/18 1719   ??? phytonadione (vitamin K1) (MEPHYTON) tablet 5 mg  5 mg Oral Q W and Sa Mackey Birchwood, MD   5 mg at 04/16/18 0825   ??? polyethylene glycol (MIRALAX) packet 17 g  17 g Oral BID Mackey Birchwood, MD   17 g at 04/06/18 1610   ??? polyethylene glycol (MIRALAX) packet 17 g  17 g Oral TID PRN Mackey Birchwood, MD       ??? senna (SENOKOT) tablet 1 tablet  1 tablet Oral Nightly Darliss Cheney Nugent, MD       ??? sodium chloride 7% nebulizer solution 4 mL  4 mL Nebulization 4x Daily (RT) Mackey Birchwood, MD   4 mL at 04/16/18 1850   ??? thiamine (B-1) tablet 200 mg  200 mg Oral Daily Mackey Birchwood, MD   200 mg at 04/16/18 0825   ??? traZODone (DESYREL) tablet 50 mg  50 mg Oral Nightly Mackey Birchwood, MD   50 mg at 04/16/18 2040       Social History:     Social History     Socioeconomic History   ??? Marital status: Single     Spouse name: None   ??? Number of children: 0   ??? Years of education: 12+   ??? Highest education level: Bachelor's degree (e.g., BA, AB, BS)   Occupational History   ??? Occupation: Restaurant manager, fast food     Comment: works at [employer]   ??? Financial resource strain: Very hard   ??? Food insecurity:     Worry: Often true     Inability: Often true   ??? Transportation needs:     Medical:  No     Non-medical: No   Tobacco Use   ??? Smoking status: Former Smoker     Packs/day: 1.00     Years: 3.00     Pack years: 3.00     Types: Cigarettes   ??? Smokeless tobacco: Never Used   ??? Tobacco comment: reports quit prior to last admission   Substance and Sexual Activity   ??? Alcohol use: Yes     Alcohol/week: 0.0 standard drinks     Comment: A fifth of [place] per day    ??? Drug use: Not Currently     Comment: hx of alcohol and prescription pain medication abuse   ??? Sexual activity: Not Currently     Partners: Female   Lifestyle   ??? Physical activity:     Days per week: 0 days     Minutes per session: 0 min   ??? Stress: Very much   Relationships   ??? Social connections:     Talks on phone: Twice a week     Gets together: Once a week     Attends religious service: Never     Active member of club or organization: No     Attends meetings of clubs or organizations: Never     Relationship status: Never married   Other Topics Concern   ??? 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 Hillard Danker with [facility] in [place]    Current therapist: Nehemiah Settle with CF team        SUBSTANCE ABUSE HX:     # MJ     -Current use: once in awhile    # Alcohol     -Current use: Was sober from 2016 until two weeks ago. Admits to drinking enough to get drunk and admits to passing out. Denies history of complicated withdrawal. Admits to withdrawal symptoms of tremors, anxiety, headache, and tactile hallucinations at times.     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     Physical Exam:  Constitutional: alert, no acute distress, sleepy but arouses.  ENT: eyes closed, mucous membranes moist  Resp: clear to auscultation, symmetric air entry  MSK: no obvious joint deformity  Neurological: awake, oriented to person, place, and time, normal speech.  Lymph:no pedal edema  Skin: normal coloration and turgor, no rashes  Psych: flat affect    Test Results    Data Review  Lab Results   Component Value Date    POCGLU 87 04/17/2018    POCGLU 179 04/16/2018    POCGLU 340 (H) 04/16/2018    POCGLU 72 04/16/2018    POCGLU 78 04/16/2018    POCGLU 60 (L) 04/16/2018    POCGLU 130 04/15/2018       Lab Results   Component Value Date    A1C 13.3 (H) 04/06/2018    A1C 9.6 (H) 01/12/2018    A1C 11.9 (H) 11/17/2017     Lab Results   Component Value Date    GFR >= 60 10/31/2010    CREATININE 1.65 (H) 04/17/2018     Lab Results   Component Value Date    CHOL 116 01/29/2018     Lab Results   Component Value Date    HDL 61 (H) 01/29/2018     Lab Results   Component Value Date    LDL 35 (L) 01/29/2018     Lab Results   Component Value Date    TRIG 102 01/29/2018

## 2018-04-17 NOTE — Unmapped (Signed)
Daily Progress Note    24hr Events/Interval History:     NAEON. Feeling sleepy and did not get much sleep last night. Glucoses in he 100s throughout the day yesterday, one recording in 300s. This mornin glucose of 60 on labs. He denies feeling like his sugars are low. Feels that his breathing is close to baseline, does not want to get PFTs performed again.     Assessment/Plan:    Walter [name] is a 37 y.o. male who presented to [facility] with Cystic fibrosis with pulmonary exacerbation (CMS-HCC) and hyperglycemia without DKA.    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 *      CF with acute pulmonary exacerbation:  CF sputum cultures positive with mucoid PsA, achromobacter, smooth PsA on meropenem/ceftaroline/cipro (10/22-11/5). Will get repeat PFTs to determine if needs abx course extended.   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 Stim test with normal response. Started on Flurinef for probable RTA and remains at higher end of normal for K level despite improved glucose control. Will lower K diet restriction due to decreased options for meals,however will continue Flurinef and check K tomorrow. Pending K value tomorrow may increase Flurinef.  -low K diet (2g-->3g)    CF related DM, hyperglycemia: A1C 10/19 13.3%, recent insulin adjustments with better controlled glucoses however is hypoglycemic in the morning  - decrease current Lantus dose  - endo recs appreciated  ??  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  - LFTs daily  - CTM, abdominal ultrasound if changes in abdominal symptoms  - HCC and variceal screening on discharge  ??  CKD: likely has component of CKD at baseline with Cr fluctuating around 1.5. Stable today and will continue to monitor.   ??  Substance abuse: No recent concern for withdrawal. Holding sedating medications.   - CTM closely  - MVI, thiamine, folate  ??  Anxiety: struggles with anxiety and has a history of suicide attempt. We are continuing home meds which include Buspar and Trazodone. Have held home Gabapentin due to lethargy and asterixis.   ??  CF related pancreatic insufficiency: Zen pep regimen, vitamin regimen, vitamin K twice a week.  ??    Daily checklist:   Diet: low potassium  VTE ppx: heparin  GI ppx: PPI  Lytes: replete PRN to keep K>4 and Mag>2  IV Access: PIV  Code status: Full Code   Dispo: Likely inpatient for course of IV antibiotics  ___________________________________________________________________      Labs/Studies:  Labs and Studies from the last 24hrs per EMR and Reviewed          Objective:  Temp:  [35.9 ??C-36.7 ??C] 36.7 ??C  Heart Rate:  [  70-83] 76  Resp:  [16-18] 18  BP: (126-157)/(77-95) 157/95  SpO2:  [94 %-97 %] 94 %    GENERAL: Chronically ill appearing gentleman laying in bed, NAD, resting comfortably  Cardiovascular: RRR, no murmurs  PULM: normal work of breathing, moving air bilaterally, no crackles, wheezing or rhonchi  ABDOMINAL: Soft, NT, ND  EXT: LUE PICC line intact  NEURO: Grossly intact    Azucena Freed, MD

## 2018-04-17 NOTE — Unmapped (Signed)
Patient refused all respiratory treatments and airway clearance this shift. Will continue to monitor.

## 2018-04-17 NOTE — Unmapped (Signed)
Patient was compliant with inhaled medications this shift. Patient refused auscultation and airway clearance. Will continue to monitor.

## 2018-04-18 LAB — MEAN CORPUSCULAR HEMOGLOBIN: Lab: 28.8

## 2018-04-18 LAB — COMPREHENSIVE METABOLIC PANEL
ALBUMIN: 3.1 g/dL — ABNORMAL LOW (ref 3.5–5.0)
ALKALINE PHOSPHATASE: 467 U/L — ABNORMAL HIGH (ref 38–126)
ALT (SGPT): 54 U/L — ABNORMAL HIGH (ref <50)
ANION GAP: 7 mmol/L (ref 7–15)
AST (SGOT): 54 U/L (ref 19–55)
BILIRUBIN TOTAL: 0.3 mg/dL (ref 0.0–1.2)
BLOOD UREA NITROGEN: 17 mg/dL (ref 7–21)
BUN / CREAT RATIO: 11
CALCIUM: 8.3 mg/dL — ABNORMAL LOW (ref 8.5–10.2)
CO2: 28 mmol/L (ref 22.0–30.0)
CREATININE: 1.59 mg/dL — ABNORMAL HIGH (ref 0.70–1.30)
EGFR CKD-EPI AA MALE: 64 mL/min/{1.73_m2} (ref >=60)
EGFR CKD-EPI NON-AA MALE: 55 mL/min/{1.73_m2} — ABNORMAL LOW (ref >=60)
GLUCOSE RANDOM: 260 mg/dL — ABNORMAL HIGH (ref 65–179)
POTASSIUM: 5.5 mmol/L — ABNORMAL HIGH (ref 3.5–5.0)
PROTEIN TOTAL: 6.2 g/dL — ABNORMAL LOW (ref 6.5–8.3)
SODIUM: 135 mmol/L (ref 135–145)

## 2018-04-18 LAB — CBC
HEMATOCRIT: 30.6 % — ABNORMAL LOW (ref 41.0–53.0)
HEMOGLOBIN: 9.9 g/dL — ABNORMAL LOW (ref 13.5–17.5)
MEAN CORPUSCULAR HEMOGLOBIN CONC: 32.2 g/dL (ref 31.0–37.0)
MEAN CORPUSCULAR HEMOGLOBIN: 28.8 pg (ref 26.0–34.0)
MEAN CORPUSCULAR VOLUME: 89.3 fL (ref 80.0–100.0)
RED BLOOD CELL COUNT: 3.43 10*12/L — ABNORMAL LOW (ref 4.50–5.90)
WBC ADJUSTED: 4.8 10*9/L (ref 4.5–11.0)

## 2018-04-18 LAB — POTASSIUM
Potassium:SCnc:Pt:Ser/Plas:Qn:: 4.3
Potassium:SCnc:Pt:Ser/Plas:Qn:: 5

## 2018-04-18 LAB — MAGNESIUM
MAGNESIUM: 1.8 mg/dL (ref 1.6–2.2)
Magnesium:MCnc:Pt:Ser/Plas:Qn:: 1.8

## 2018-04-18 LAB — INR: Lab: 0.91

## 2018-04-18 LAB — BILIRUBIN TOTAL: Bilirubin:MCnc:Pt:Ser/Plas:Qn:: 0.3

## 2018-04-18 LAB — C-REACTIVE PROTEIN: C reactive protein:MCnc:Pt:Ser/Plas:Qn:: 12.2 — ABNORMAL HIGH

## 2018-04-18 NOTE — Unmapped (Signed)
Patient was compliant with doing treatment due at 1900, but due to late treatments in the evening, requested not to do 2300 treatment.  Inhaled medications given with no adverse reaction.  Patient does [place] independently.

## 2018-04-18 NOTE — Unmapped (Signed)
Patient has been alert and oriented x4. Patient has denied shortness of breath. Patient remains on room air. Patient did disconnect himself from his PICC line during infusion of Merrem and walked down to [place] without permission. MD made aware. Patient educated on the importance of not disconnecting himself from PICC line. Patient verbalized understanding. Patient did call out for insulin prior to meals this shift. Patient stated he did have a BM this shift.  Continue with plan of care.    Problem: Adult Inpatient Plan of Care  Goal: Plan of Care Review  Outcome: Ongoing - Unchanged     Problem: Infection  Goal: Infection Symptom Resolution  Outcome: Ongoing - Unchanged     Problem: Infection (Cystic Fibrosis)  Goal: Absence of Infection Signs/Symptoms  Outcome: Ongoing - Unchanged     Problem: Respiratory Compromise (Cystic Fibrosis)  Goal: Effective Oxygenation and Ventilation  Outcome: Ongoing - Unchanged     Problem: Diabetes Comorbidity  Goal: Blood Glucose Level Within Desired Range  Outcome: Progressing

## 2018-04-18 NOTE — Unmapped (Signed)
Endocrinology Consult Follow Up Note    Requesting Attending Physician :  Blair Dolphin, *  Service Requesting Consult : Pulmonology (MDG)  Primary Care Provider: Dellis Filbert, MD  Outpatient Endocrinologist: N/A    Assessment/Recommendations:    Walter [name] is a 37 y.o. 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 I have been asked to see in consultation at the request of [name] MD for evaluation of CFRD.    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 Complicated by infection and noncompliance. Presented in DKA and now off IV insulin infusion. Has had variable glucoses due to late night eating/snacking and not asking for insulin coverage. Patient encouraged to ask for insulin prior to meals/snacks.    Recommendations are as follows- issue yesterday was no mealtime coverage late    -Lantus 28 units qhs  -Lispro 14 units with meals up to 5x per day to cover for late night meals. Hold if NPO, half dose if small meal  -Continue standard correction scale 2:50>150    Hyperkalemia- Differential is broad. 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 Adrenal insufficiency has been ruled out with ACTH stim from 5.3 to 22.8    - fludrocortisone empirically started to try and help  - K clearly fluctuates with glucose    The patient was seen and discussed with Dr.Harris    We will continue to follow patient as needed. Please call/page [phone] with questions or concerns.     Elpidio Galea, MD  PGY4 Endocrinology Fellow    ------------------------------------------------------------    History of Present Illness:     Reason for Consult: Hyperglycemia    Walter [name] is a 36 y.o. male admitted for Cystic fibrosis with pulmonary exacerbation (CMS-HCC). I have been asked to evaluate Walter [name] for CFRD. He has been started on IV antibiotics. He is not on any steroids for this exacerbation. Interval history: Glucose control better (less lows), starting having postprandial high after dinner and overnight. Does not appear he got a dinner dose of lispro.    History from initial encounter:  Patient is well known to the consult service from prior admissions. He was last seen by our team on 02/03/18 at which point he was receiving Lantus 30 units qhs, Lispro 14 units with meals and on a standard correction scale. His most recent A1c is 9.6% (01/12/18). Patient reports DM medication noncompliance as he was focusing on trying to control his CF. He acknowledged that he takes up to 18 units of lispro with meals and 30 units of lantus at home but is not consistent. He presented with BG >1000 and an elevated anion gap. He received 10 units of regular insulin overnight and 15 units of NOH this morning. He was briefly placed on IV insulin gtt and his anion gap closed. He reports good appetite. Thus, we will restart a prior known in-hospital regimen that worked.    Past Medical History:    Medical History:    Past Medical History:   Diagnosis Date   ??? Alcohol abuse    ??? 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 Bipolar disorder (CMS-HCC)    ??? Chronic pancreatitis (CMS-HCC)    ??? Chronic sinusitis    ??? 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 Cystic fibrosis (CMS-HCC)    ??? Diabetes mellitus (CMS-HCC)     dx in-house 2015   ???  Disease of thyroid gland    ??? Hypertension    ??? Kidney stones    ??? Portal hypertension (CMS-HCC)        Surgical History:    Past Surgical History:   Procedure Laterality Date   ??? CHOLECYSTECTOMY  2008   ??? sinus surgery     ??? TONSILLECTOMY       Allergies:  Patient has no known allergies.    All Medications:   Current Facility-Administered Medications   Medication Dose Route Frequency Provider Last Rate Last Dose   ??? acetaminophen (TYLENOL) tablet 500 mg  500 mg Oral Q6H PRN Azucena Freed, MD       ??? albuterol 2.5 mg /3 mL (0.083 %) nebulizer solution 2.5 mg  2.5 mg Nebulization 4x Daily (RT) Mackey Birchwood, MD   2.5 mg at 04/17/18 2009   ??? busPIRone (BUSPAR) tablet 15 mg  15 mg Oral TID Mackey Birchwood, MD   15 mg at 04/17/18 2037   ??? cholecalciferol (vitamin D3) tablet 7,500 Units  7,500 Units Oral Daily Mackey Birchwood, MD   7,500 Units at 04/17/18 1119   ??? ciprofloxacin HCl (CIPRO) tablet 750 mg  750 mg Oral Q12H SCH Azucena Freed, MD   750 mg at 04/17/18 2037   ??? insulin regular (HumuLIN,NovoLIN) injection 10 Units  10 Units Intravenous Once Azucena Freed, MD   Stopped at 04/13/18 0920    Followed by   ??? dextrose (D10W) 10% bolus 125 mL  12.5 g Intravenous Once Azucena Freed, MD   Stopped at 04/13/18 0920   ??? dextrose (D10W) 10% bolus 250 mL  25 g Intravenous Q30 Min PRN Wyatt Mage, MD       ??? dornase alfa (PULMOZYME) 1 mg/mL nebulizer solution 2.5 mg  2.5 mg Inhalation Daily (RT) Mackey Birchwood, MD   2.5 mg at 04/17/18 1210   ??? fludrocortisone (FLORINEF) tablet 50 mcg  50 mcg Oral Daily Wyatt Mage, MD   50 mcg at 04/17/18 1119   ??? folic acid (FOLVITE) tablet 1 mg  1 mg Oral Daily Mackey Birchwood, MD   1 mg at 04/17/18 1120   ??? heparin (porcine) injection 5,000 Units  5,000 Units Subcutaneous [facility] Mackey Birchwood, MD   5,000 Units at 04/10/18 2025   ??? heparin, porcine (PF) 100 unit/mL injection 200 Units  200 Units Intravenous Daily PRN Azucena Freed, MD       ??? hydrocortisone 1 % cream   Topical Daily PRN Mackey Birchwood, MD       ??? insulin glargine (LANTUS) injection 28 Units  28 Units Subcutaneous Nightly Azucena Freed, MD   28 Units at 04/17/18 2034   ??? insulin lispro (HumaLOG) injection 0-12 Units  0-12 Units Subcutaneous ACHS Wyatt Mage, MD   4 Units at 04/17/18 2208   ??? insulin lispro (HumaLOG) injection 14 Units  14 Units Subcutaneous 5XD insulin Azucena Freed, MD   14 Units at 04/17/18 1523   ??? ketoconazole (NIZORAL) 2 % cream 1 application  1 application Topical Daily Mackey Birchwood, MD   1 application at 04/11/18 0925   ??? lactulose (CHRONULAC) oral solution (30 mL cup)  20 g Oral Once PRN Wyatt Mage, MD       ??? lidocaine (LIDODERM) 5 % patch 2 patch  2 patch Transdermal Q24H Mackey Birchwood, MD   Stopped at 04/07/18 0941   ??? lipase-protease-amylase (ZENPEP) 20,000-63,000- 84,000 unit capsule, delayed release  240,000 units of lipase  12 capsule Oral 3xd Meals Mackey Birchwood, MD   240,000 units of lipase at 04/17/18 1925   ??? magnesium oxide (MAG-OX) tablet 800 mg  800 mg Oral BID Azucena Freed, MD   800 mg at 04/17/18 1924   ??? melatonin tablet 3 mg  3 mg Oral QPM Mackey Birchwood, MD   3 mg at 04/17/18 2038   ??? meropenem (MERREM) 2 g in sodium chloride (NS) 0.9 % 100 mL IVPB  2 g Intravenous Q12H Azucena Freed, MD   Stopped at 04/18/18 0403   ??? mirtazapine (REMERON) tablet 45 mg  45 mg Oral Nightly Mackey Birchwood, MD   45 mg at 04/17/18 2037   ??? MVW Complete (pediatric multivit 61-D3-vit K) 1,500-800 unit-mcg 2 capsule  2 capsule Oral Daily Mackey Birchwood, MD   2 capsule at 04/17/18 1119   ??? OLANZapine (ZYPREXA) tablet 15 mg  15 mg Oral Nightly Mackey Birchwood, MD   15 mg at 04/17/18 2039   ??? OLANZapine (ZYPREXA) tablet 2.5 mg  2.5 mg Oral BID PRN Azucena Freed, MD       ??? oxyCODONE (ROXICODONE) 5 MG immediate release tablet            ??? pantoprazole (PROTONIX) EC tablet 40 mg  40 mg Oral BID with meals Mackey Birchwood, MD   40 mg at 04/17/18 1924   ??? phytonadione (vitamin K1) (MEPHYTON) tablet 5 mg  5 mg Oral Q W and Sa Mackey Birchwood, MD   5 mg at 04/16/18 0825   ??? polyethylene glycol (MIRALAX) packet 17 g  17 g Oral BID Mackey Birchwood, MD   17 g at 04/06/18 1610   ??? polyethylene glycol (MIRALAX) packet 17 g  17 g Oral TID PRN Mackey Birchwood, MD       ??? senna (SENOKOT) tablet 1 tablet  1 tablet Oral Nightly Darliss Cheney Nugent, MD       ??? sodium chloride 7% nebulizer solution 4 mL  4 mL Nebulization 4x Daily (RT) Mackey Birchwood, MD   4 mL at 04/17/18 2010   ??? sodium polystyrene sulfonate (SPS) oral suspension  15 g Oral Once Azucena Freed, MD       ??? thiamine (B-1) tablet 200 mg  200 mg Oral Daily Mackey Birchwood, MD   200 mg at 04/17/18 1120   ??? traZODone (DESYREL) tablet 50 mg  50 mg Oral Nightly Mackey Birchwood, MD   50 mg at 04/17/18 2038       Social History:     Social History     Socioeconomic History   ??? Marital status: Single     Spouse name: None   ??? Number of children: 0   ??? Years of education: 12+   ??? Highest education level: Bachelor's degree (e.g., BA, AB, BS)   Occupational History   ??? Occupation: Restaurant manager, fast food     Comment: works at [employer]   ??? Financial resource strain: Very hard   ??? Food insecurity:     Worry: Often true     Inability: Often true   ??? Transportation needs:     Medical: No     Non-medical: No   Tobacco Use   ??? Smoking status: Former Smoker     Packs/day: 1.00     Years: 3.00     Pack years: 3.00     Types: Cigarettes   ??? Smokeless tobacco: Never Used   ???  Tobacco comment: reports quit prior to last admission   Substance and Sexual Activity   ??? Alcohol use: Yes     Alcohol/week: 0.0 standard drinks     Comment: A fifth of [place] per day    ??? Drug use: Not Currently     Comment: hx of alcohol and prescription pain medication abuse   ??? Sexual activity: Not Currently     Partners: Female   Lifestyle   ??? Physical activity:     Days per week: 0 days     Minutes per session: 0 min   ??? Stress: Very much   Relationships   ??? Social connections:     Talks on phone: Twice a week     Gets together: Once a week     Attends religious service: Never     Active member of club or organization: No     Attends meetings of clubs or organizations: Never     Relationship status: Never married   Other Topics Concern   ??? 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 Hillard Danker with [facility] in [place]    Current therapist: Nehemiah Settle with CF team        SUBSTANCE ABUSE HX:     # MJ     -Current use: once in awhile    # Alcohol     -Current use: Was sober from 2016 until two weeks ago. Admits to drinking enough to get drunk and admits to passing out. Denies history of complicated withdrawal. Admits to withdrawal symptoms of tremors, anxiety, headache, and tactile hallucinations at times.     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     Physical Exam:  Constitutional: alert, no acute distress, sleepy but arouses.  ENT: eyes closed, mucous membranes moist  Resp: clear to auscultation, symmetric air entry  MSK: no obvious joint deformity  Neurological: awake, oriented to person, place, and time, normal speech.  Lymph:no pedal edema  Skin: normal coloration and turgor, no rashes  Psych: flat affect    Test Results    Data Review  Lab Results   Component Value Date    POCGLU 290 (H) 04/18/2018    POCGLU 216 (H) 04/17/2018    POCGLU 71 04/17/2018    POCGLU 93 04/17/2018    POCGLU 119 04/17/2018    POCGLU 87 04/17/2018    POCGLU 179 04/16/2018       Lab Results   Component Value Date    A1C 13.3 (H) 04/06/2018    A1C 9.6 (H) 01/12/2018    A1C 11.9 (H) 11/17/2017     Lab Results   Component Value Date    GFR >= 60 10/31/2010    CREATININE 1.59 (H) 04/18/2018     Lab Results   Component Value Date    CHOL 116 01/29/2018     Lab Results   Component Value Date    HDL 61 (H) 01/29/2018     Lab Results   Component Value Date    LDL 35 (L) 01/29/2018     Lab Results   Component Value Date    TRIG 102 01/29/2018

## 2018-04-18 NOTE — Unmapped (Signed)
Patient refused PFT testing today.  Will check tomorrow (04/19/2018).

## 2018-04-18 NOTE — Unmapped (Signed)
Daily Progress Note    24hr Events/Interval History:     NAEON. Went off the floor without face mask and has been refusing meal time insulin.     Assessment/Plan:    Walter [name] is a 37 y.o. male who presented to [facility] with Cystic fibrosis with pulmonary exacerbation (CMS-HCC) and hyperglycemia without DKA.    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 *      CF with acute pulmonary exacerbation:  CF sputum cultures positive with mucoid PsA, achromobacter, smooth PsA on meropenem/cipro (10/22-11/5). DC Cefaroline 11/3 due to no MRSA. Will try to get PFTs however he is currently refusing. Can also discuss with Dr. Lurena Nida if he would like him to stay for prolonged treatment as he is nearing 2 week mark.   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 Stim test with normal response. Started on Flurinef for probable RTA and remains at higher end of normal for K level despite improved glucose control. Potassium elevated this morning after episodes of hyperglycemia and increase in K in diet to 3 grams  -low K diet changed back to 2 g  - frequent K checks  -increased florinef    CF related DM, hyperglycemia: A1C 10/19 13.3%, recent insulin adjustments with better controlled glucoses however is hypoglycemic in the morning  - continue current insulin regimen, 28 U Lantus, 14 U  Meal time, SSI  - endo recs appreciated  ??  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  - LFTs daily  - CTM, abdominal ultrasound if changes in abdominal symptoms  - HCC and variceal screening on discharge  ??  CKD: likely has component of CKD at baseline with Cr fluctuating around 1.5. Stable today and will continue to monitor.   ??  Anxiety: struggles with anxiety and has a history of suicide attempt. We are continuing home meds which include Buspar and Trazodone. Have held home Gabapentin due to lethargy and asterixis.   ??  CF related pancreatic insufficiency: Zen pep regimen, vitamin regimen, vitamin K twice a week.  ??    Daily checklist:   Diet: low potassium  VTE ppx: heparin  GI ppx: PPI  Lytes: replete PRN to keep K>4 and Mag>2  IV Access: PIV  Code status: Full Code   Dispo: Likely inpatient for course of IV antibiotics  ___________________________________________________________________      Labs/Studies:  Labs and Studies from the last 24hrs per EMR and Reviewed          Objective:  Temp:  [35.9 ??C-36.9 ??C] 36 ??C  Heart Rate:  [70-97] 70  Resp:  [16-18] 18  BP: (110-124)/(69-78) 110/69  SpO2:  [94 %-98 %] 98 %  GENERAL: Chronically ill appearing gentleman laying in bed, NAD, resting comfortably  PULM: normal work of breathing, moving air bilaterally  EXT: LUE PICC line intact  NEURO: Grossly intact    Azucena Freed, MD

## 2018-04-18 NOTE — Unmapped (Signed)
Patient AOX4, VSS throughout shift. All meds given on time as ordered. Abx tolerated well. Pt refused meal dose insulin for fear of BG dropping too low overnight. Provider notified for discussion of rather meal dose insulin can be lowered. BG 290 this morning which is the highest it has been this shift. Pt refused insulin coverage for this meal dose. Sliding scale insulin coverage was given last night. Pt sleeping in bed. POC maintained. Will continue to monitor.    Problem: Adult Inpatient Plan of Care  Goal: Plan of Care Review  Outcome: Progressing  Goal: Patient-Specific Goal (Individualization)  Outcome: Progressing  Goal: Absence of Hospital-Acquired Illness or Injury  Outcome: Progressing  Goal: Optimal Comfort and Wellbeing  Outcome: Progressing  Goal: Readiness for Transition of Care  Outcome: Progressing  Goal: Rounds/Family Conference  Outcome: Progressing     Problem: Infection  Goal: Infection Symptom Resolution  Outcome: Progressing     Problem: Infection (Cystic Fibrosis)  Goal: Absence of Infection Signs/Symptoms  Outcome: Progressing     Problem: Respiratory Compromise (Cystic Fibrosis)  Goal: Effective Oxygenation and Ventilation  Outcome: Progressing     Problem: Diabetes Comorbidity  Goal: Blood Glucose Level Within Desired Range  Outcome: Progressing

## 2018-04-19 LAB — COMPREHENSIVE METABOLIC PANEL
ALBUMIN: 3.3 g/dL — ABNORMAL LOW (ref 3.5–5.0)
ALKALINE PHOSPHATASE: 469 U/L — ABNORMAL HIGH (ref 38–126)
ALT (SGPT): 45 U/L (ref <50)
ANION GAP: 9 mmol/L (ref 7–15)
AST (SGOT): 47 U/L (ref 19–55)
BILIRUBIN TOTAL: 0.2 mg/dL (ref 0.0–1.2)
BLOOD UREA NITROGEN: 15 mg/dL (ref 7–21)
BUN / CREAT RATIO: 8
CHLORIDE: 101 mmol/L (ref 98–107)
CO2: 30 mmol/L (ref 22.0–30.0)
CREATININE: 1.77 mg/dL — ABNORMAL HIGH (ref 0.70–1.30)
EGFR CKD-EPI AA MALE: 56 mL/min/{1.73_m2} — ABNORMAL LOW (ref >=60)
EGFR CKD-EPI NON-AA MALE: 49 mL/min/{1.73_m2} — ABNORMAL LOW (ref >=60)
GLUCOSE RANDOM: 60 mg/dL — ABNORMAL LOW (ref 65–179)
POTASSIUM: 4.6 mmol/L (ref 3.5–5.0)
PROTEIN TOTAL: 6.4 g/dL — ABNORMAL LOW (ref 6.5–8.3)
SODIUM: 140 mmol/L (ref 135–145)

## 2018-04-19 LAB — CBC
HEMATOCRIT: 31.1 % — ABNORMAL LOW (ref 41.0–53.0)
HEMOGLOBIN: 10.1 g/dL — ABNORMAL LOW (ref 13.5–17.5)
MEAN CORPUSCULAR HEMOGLOBIN CONC: 32.5 g/dL (ref 31.0–37.0)
MEAN CORPUSCULAR HEMOGLOBIN: 28.8 pg (ref 26.0–34.0)
MEAN CORPUSCULAR VOLUME: 88.5 fL (ref 80.0–100.0)
MEAN PLATELET VOLUME: 7.4 fL (ref 7.0–10.0)
RED BLOOD CELL COUNT: 3.51 10*12/L — ABNORMAL LOW (ref 4.50–5.90)
RED CELL DISTRIBUTION WIDTH: 14.1 % (ref 12.0–15.0)
WBC ADJUSTED: 7.1 10*9/L (ref 4.5–11.0)

## 2018-04-19 LAB — PROTIME: Lab: 10.5

## 2018-04-19 LAB — ALT (SGPT): Alanine aminotransferase:CCnc:Pt:Ser/Plas:Qn:: 45

## 2018-04-19 LAB — MAGNESIUM: Magnesium:MCnc:Pt:Ser/Plas:Qn:: 1.8

## 2018-04-19 LAB — MEAN PLATELET VOLUME: Lab: 7.4

## 2018-04-19 NOTE — Unmapped (Signed)
Endocrinology Consult Follow Up Note    Requesting Attending Physician :  Nyra Jabs, MD  Service Requesting Consult : Pulmonology (MDG)  Primary Care Provider: Dellis Filbert, MD  Outpatient Endocrinologist: N/A    Assessment/Recommendations:    Walter [name] is a 37 y.o. 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 I have been asked to see in consultation at the request of [name] MD for evaluation of CFRD.    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 Complicated by infection and noncompliance. Presented in DKA and now off IV insulin infusion. Has had variable glucoses due to late night eating/snacking and not asking for insulin coverage. Patient encouraged to ask for insulin prior to meals/snacks.    Recommendations are as follows- issue yesterday was no mealtime coverage in the afternoon, but still probably has too much correction and mealtime when he does get it. Discussed extensively with patient and nursing- meal/snacks need to be covered preprandially.     -Lantus 28units qhs  -Lispro 12 units with meals up to 5x per day to cover for late night meals. Hold if NPO, half dose if small meal  -Continue standard correction scale 2:50>150    Hyperkalemia- Differential is broad. 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 Adrenal insufficiency has been ruled out with ACTH stim from 5.3 to 22.8    - fludrocortisone empirically started to try and help  - K clearly fluctuates with glucose    The patient was seen and discussed with Dr.Harris    We will continue to follow patient as needed. Please call/page [phone] with questions or concerns.     Elpidio Galea, MD  PGY4 Endocrinology Fellow    ------------------------------------------------------------    History of Present Illness:     Reason for Consult: Hyperglycemia    Walter [name] is a 37 y.o. male admitted for Cystic fibrosis with pulmonary exacerbation (CMS-HCC). I have been asked to evaluate [facility] for CFRD. He has been started on IV antibiotics. He is not on any steroids for this exacerbation.     Interval history: He is having excursions frequently again now that he is eating poorly. Clearly did not have coverage for afternoon food and then was corrected too much.     History from initial encounter:  Patient is well known to the consult service from prior admissions. He was last seen by our team on 02/03/18 at which point he was receiving Lantus 30 units qhs, Lispro 14 units with meals and on a standard correction scale. His most recent A1c is 9.6% (01/12/18). Patient reports DM medication noncompliance as he was focusing on trying to control his CF. He acknowledged that he takes up to 18 units of lispro with meals and 30 units of lantus at home but is not consistent. He presented with BG >1000 and an elevated anion gap. He received 10 units of regular insulin overnight and 15 units of NOH this morning. He was briefly placed on IV insulin gtt and his anion gap closed. He reports good appetite. Thus, we will restart a prior known in-hospital regimen that worked.    Past Medical History:    Medical History:    Past Medical History:   Diagnosis Date   ??? Alcohol abuse    ??? 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 Bipolar disorder (CMS-HCC)    ??? Chronic pancreatitis (CMS-HCC)    ???  Chronic sinusitis    ??? 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 Cystic fibrosis (CMS-HCC)    ??? Diabetes mellitus (CMS-HCC)     dx in-house 2015   ??? Disease of thyroid gland    ??? Hypertension    ??? Kidney stones    ??? Portal hypertension (CMS-HCC)        Surgical History:    Past Surgical History:   Procedure Laterality Date   ??? CHOLECYSTECTOMY  2008   ??? sinus surgery     ??? TONSILLECTOMY       Allergies:  Patient has no known allergies.    All Medications:   Current Facility-Administered Medications   Medication Dose Route Frequency Provider Last Rate Last Dose   ??? acetaminophen (TYLENOL) tablet 500 mg  500 mg Oral Q6H PRN Azucena Freed, MD       ??? albuterol 2.5 mg /3 mL (0.083 %) nebulizer solution 2.5 mg  2.5 mg Nebulization 4x Daily (RT) Mackey Birchwood, MD   2.5 mg at 04/18/18 1822   ??? busPIRone (BUSPAR) tablet 15 mg  15 mg Oral TID Mackey Birchwood, MD   15 mg at 04/18/18 2100   ??? cholecalciferol (vitamin D3) tablet 7,500 Units  7,500 Units Oral Daily Mackey Birchwood, MD   7,500 Units at 04/18/18 0901   ??? ciprofloxacin HCl (CIPRO) tablet 750 mg  750 mg Oral Q12H SCH Azucena Freed, MD   750 mg at 04/18/18 2101   ??? insulin regular (HumuLIN,NovoLIN) injection 10 Units  10 Units Intravenous Once Azucena Freed, MD   Stopped at 04/13/18 0920    Followed by   ??? dextrose (D10W) 10% bolus 125 mL  12.5 g Intravenous Once Azucena Freed, MD   Stopped at 04/13/18 0920   ??? dextrose (D10W) 10% bolus 250 mL  25 g Intravenous Q30 Min PRN Wyatt Mage, MD   Stopped at 04/19/18 [phone]   ??? dornase alfa (PULMOZYME) 1 mg/mL nebulizer solution 2.5 mg  2.5 mg Inhalation Daily (RT) Mackey Birchwood, MD   2.5 mg at 04/18/18 1107   ??? fludrocortisone (FLORINEF) tablet 100 mcg  100 mcg Oral Daily Sharyon Medicus, MD   100 mcg at 04/18/18 0859   ??? folic acid (FOLVITE) tablet 1 mg  1 mg Oral Daily Mackey Birchwood, MD   1 mg at 04/18/18 0900   ??? heparin (porcine) injection 5,000 Units  5,000 Units Subcutaneous [facility] Mackey Birchwood, MD   5,000 Units at 04/10/18 2025   ??? heparin, porcine (PF) 100 unit/mL injection 200 Units  200 Units Intravenous Daily PRN Azucena Freed, MD   200 Units at 04/18/18 1407   ??? hydrocortisone 1 % cream   Topical Daily PRN Mackey Birchwood, MD       ??? insulin glargine (LANTUS) injection 28 Units  28 Units Subcutaneous Nightly Azucena Freed, MD   28 Units at 04/18/18 2101   ??? insulin lispro (HumaLOG) injection 0-12 Units  0-12 Units Subcutaneous ACHS Wyatt Mage, MD   12 Units at 04/18/18 2121   ??? insulin lispro (HumaLOG) injection 14 Units  14 Units Subcutaneous 5XD insulin Azucena Freed, MD   14 Units at 04/18/18 2120   ??? ketoconazole (NIZORAL) 2 % cream 1 application  1 application Topical Daily Mackey Birchwood, MD   1 application at 04/18/18 0906   ??? lactulose (CHRONULAC) oral solution (30 mL cup)  20 g Oral Once PRN Albertina Senegal  Marzetta Board, MD       ??? lidocaine (LIDODERM) 5 % patch 2 patch  2 patch Transdermal Q24H Mackey Birchwood, MD   Stopped at 04/07/18 0941   ??? lipase-protease-amylase (ZENPEP) 20,000-63,000- 84,000 unit capsule, delayed release 240,000 units of lipase  12 capsule Oral 3xd Meals Mackey Birchwood, MD   240,000 units of lipase at 04/18/18 1250   ??? magnesium oxide (MAG-OX) tablet 800 mg  800 mg Oral BID Azucena Freed, MD   800 mg at 04/18/18 1801   ??? melatonin tablet 3 mg  3 mg Oral QPM Mackey Birchwood, MD   3 mg at 04/18/18 2101   ??? meropenem (MERREM) 2 g in sodium chloride (NS) 0.9 % 100 mL IVPB  2 g Intravenous Q12H Azucena Freed, MD   Stopped at 04/19/18 0425   ??? mirtazapine (REMERON) tablet 45 mg  45 mg Oral Nightly Mackey Birchwood, MD   45 mg at 04/18/18 2101   ??? MVW Complete (pediatric multivit 61-D3-vit K) 1,500-800 unit-mcg 2 capsule  2 capsule Oral Daily Mackey Birchwood, MD   2 capsule at 04/18/18 1249   ??? OLANZapine (ZYPREXA) tablet 15 mg  15 mg Oral Nightly Mackey Birchwood, MD   15 mg at 04/18/18 2105   ??? OLANZapine (ZYPREXA) tablet 2.5 mg  2.5 mg Oral BID PRN Azucena Freed, MD       ??? oxyCODONE (ROXICODONE) 5 MG immediate release tablet            ??? pantoprazole (PROTONIX) EC tablet 40 mg  40 mg Oral BID with meals Mackey Birchwood, MD   40 mg at 04/18/18 1801   ??? phytonadione (vitamin K1) (MEPHYTON) tablet 5 mg  5 mg Oral Q W and Sa Mackey Birchwood, MD   5 mg at 04/16/18 0825   ??? polyethylene glycol (MIRALAX) packet 17 g  17 g Oral BID Mackey Birchwood, MD   17 g at 04/06/18 0981   ??? polyethylene glycol (MIRALAX) packet 17 g  17 g Oral TID PRN Mackey Birchwood, MD       ??? senna (SENOKOT) tablet 1 tablet  1 tablet Oral Nightly Darliss Cheney Nugent, MD       ??? sodium chloride 7% nebulizer solution 4 mL  4 mL Nebulization 4x Daily (RT) Mackey Birchwood, MD   4 mL at 04/18/18 1822   ??? thiamine (B-1) tablet 200 mg  200 mg Oral Daily Mackey Birchwood, MD   200 mg at 04/18/18 0859   ??? traZODone (DESYREL) tablet 50 mg  50 mg Oral Nightly Mackey Birchwood, MD   50 mg at 04/18/18 2101       Social History:     Social History     Socioeconomic History   ??? Marital status: Single     Spouse name: None   ??? Number of children: 0   ??? Years of education: 12+   ??? Highest education level: Bachelor's degree (e.g., BA, AB, BS)   Occupational History   ??? Occupation: Restaurant manager, fast food     Comment: works at [employer]   ??? Financial resource strain: Very hard   ??? Food insecurity:     Worry: Often true     Inability: Often true   ??? Transportation needs:     Medical: No     Non-medical: No   Tobacco Use   ??? Smoking status: Former Smoker     Packs/day: 1.00     Years: 3.00  Pack years: 3.00     Types: Cigarettes   ??? Smokeless tobacco: Never Used   ??? Tobacco comment: reports quit prior to last admission   Substance and Sexual Activity   ??? Alcohol use: Yes     Alcohol/week: 0.0 standard drinks     Comment: A fifth of [place] per day    ??? Drug use: Not Currently     Comment: hx of alcohol and prescription pain medication abuse   ??? Sexual activity: Not Currently     Partners: Female   Lifestyle   ??? Physical activity:     Days per week: 0 days     Minutes per session: 0 min   ??? Stress: Very much   Relationships   ??? Social connections:     Talks on phone: Twice a week     Gets together: Once a week     Attends religious service: Never     Active member of club or organization: No     Attends meetings of clubs or organizations: Never     Relationship status: Never married   Other Topics Concern   ??? 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 Hillard Danker with [facility] in [place]    Current therapist: Nehemiah Settle with CF team        SUBSTANCE ABUSE HX:     # MJ     -Current use: once in awhile    # Alcohol     -Current use: Was sober from 2016 until two weeks ago. Admits to drinking enough to get drunk and admits to passing out. Denies history of complicated withdrawal. Admits to withdrawal symptoms of tremors, anxiety, headache, and tactile hallucinations at times.     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     Physical Exam:  Constitutional: alert, no acute distress, avoidant today  ENT: anicteric sclera  Resp: clear to auscultation, symmetric air entry  MSK: no obvious joint deformity  Neurological: awake, oriented to person, place, and time, normal speech.  Lymph:no pedal edema  Skin: normal coloration and turgor, no rashes  Psych: flat affect    Test Results    Data Review  Lab Results   Component Value Date    POCGLU 67 04/19/2018    POCGLU 184 (H) 04/19/2018    POCGLU 75 04/19/2018    POCGLU 87 04/19/2018    POCGLU 462 (HH) 04/18/2018    POCGLU 145 04/18/2018    POCGLU 290 (H) 04/18/2018       Lab Results   Component Value Date    A1C 13.3 (H) 04/06/2018    A1C 9.6 (H) 01/12/2018    A1C 11.9 (H) 11/17/2017     Lab Results   Component Value Date    GFR >= 60 10/31/2010    CREATININE 1.77 (H) 04/19/2018     Lab Results   Component Value Date    CHOL 116 01/29/2018     Lab Results   Component Value Date    HDL 61 (H) 01/29/2018     Lab Results   Component Value Date    LDL 35 (L) 01/29/2018     Lab Results   Component Value Date    TRIG 102 01/29/2018

## 2018-04-19 NOTE — Unmapped (Signed)
Pt remains on RA with stable O2 sats, no resp distress noted. IV abx continued as ordered, pt refused neb treatments overnight. Pt's blood glucose low overnight, prn dextrose given x 2 with some improvement. No c/o's pain/discomfort, will cont to monitor.

## 2018-04-19 NOTE — Unmapped (Signed)
Cystic Fibrosis Nutrition Assessment    Inpatient: MD Consult this admission and related follow up   Primary Pulmonary Provider: Dr. Lurena Nida  ==================================================================  Walter [name] is a 37 y.o. 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 ===================================================================  INTERVENTION:    1. Continue potassium restricted diet (2gm)  - potassium level normal  - pt reports more difficulty finding foods he likes and feeling full with this restriction     2. Consider decrease Zenpep 20,000 to 10/meal & 5/snacks = 2666 units lipase/kg/meal   - current rx is zenpep 20,0000 at 12/meal = 3200 units lipase/kg/meal    3. Continue nutrition regimen:  - MVW Complete Formulation capsules 2 daily during admission (at home, resume MVW-D3000 orange 2 chews daily)  - vitamin D3/cholecalciferol 7,500 units daily - during admission only  - Vitamin K 5mg  phytonadione twice weekly until d/c IV antibiotics  - acid reducer    - insulin regimen per MD team  - bowel regimen   - remeron    Inpatient:   Will follow up with patient per protocol: 1-2 times per week (and more frequent as indicated)  ===================================================================  ASSESSMENT:  Cystic Fibrosis Nutrition Category = Urgent Need     Current diet is appropriate for CF. Current PO intake is improving and closer to meeting estimated CF needs. Patient continues to work towards goals for weight management.   Patient would benefit from adjustment in enzyme regimen. Vitamin prescription is appropriate to reach/maintain optimal fat soluble vitamin levels. Patient may benefit from vitamin K supplementation while on IV antibiotics. Bowel regimen is appropriate. Acid reducer appropriate for GERD and enzyme activation.     Malnutrition Assessment using AND/ASPEN Clinical Characteristics:  Deferred NFPE.     Goals:  1. Meet estimated daily needs: 3314 kcals while inpatient considering lower activity factor, recommend increase to 4141 kcals when outpatient considering higher activity factor; 84-112 gm pro; 2514 mL free water      Calories estimated using: Cystic Fibrosis Conference Formula, fluid per Holiday Segar, protein per DRI x 1.5-2  2. Reach/maintain established goals for CF:                Adult - BMI 22kg/m2 for CF females and 23kg/m2 for CF males    Pediatric - BMI or wt/ht ratio > 50%ile for age  39. Normal fat-soluble vitamin levels: Vitamin A, Vitamin E and PT per lab range; Vitamin D 25OH total >30  4. Maintain glucose control. Carbohydrate content of diet should comprise 40-50% of total calorie needs, but carbohydrates are not restricted in this population.    5. Meet sodium needs for CF  ===================================================================  INPATIENT:  Current Nutrition Orders (inpatient):       Nutrition Orders   (From admission, onward)             Start     Ordered    04/18/18 0726  Nutrition Therapy High Calorie High Protein; Potassium Restricted (2 gm K)  Effective breakfast tomorrow     Question Answer Comment   Nutrition Therapy (T): High Calorie High Protein    Electrolytes (T): Potassium Restricted (2 gm K)        04/18/18 0725              Nutrition Hx:  lanuts, lispro; home Zenpep 20,000 (12/meal) reported this admission 04/2018 but h/o zenpep 40,000 (5/3), did not tolerate Creon;  2 MVW complete formulation D3000 chewables daily; miralax, protonix, ursodisol; Hx of drinking  chocolate Ensure Plus during admits; remeron for appetite. Hx Alcohol Abuse noted.    CF Nutrition related medications (inpatient):  Nutritionally relevant medications reviewed.   ???  cholecalciferol (vitamin D3) tablet 7,500 Units, 7,500 Units, Oral, Daily, Mackey Birchwood, MD, 7,500 Units at 04/19/18 0902  ???  folic acid (FOLVITE) tablet 1 mg, 1 mg, Oral, Daily, Mackey Birchwood, MD, 1 mg at 04/19/18 0903  ???  insulin glargine (LANTUS) injection 28 Units, 28 Units, Subcutaneous, Nightly, Azucena Freed, MD, 28 Units at 04/18/18 2101  ???  insulin lispro (HumaLOG) injection 0-12 Units, 0-12 Units, Subcutaneous, ACHS, Wyatt Mage, MD, 12 Units at 04/18/18 2121  ???  insulin lispro (HumaLOG) injection 14 Units, 14 Units, Subcutaneous, 5XD insulin, Azucena Freed, MD, 14 Units at 04/19/18 1100  ???  lactulose (CHRONULAC) oral solution (30 mL cup), 20 g, Oral, Once PRN, Wyatt Mage, MD  ???  lipase-protease-amylase (ZENPEP) 20,000-63,000- 84,000 unit capsule, delayed release 240,000 units of lipase, 12 capsule, Oral, 3xd Meals,   ???  magnesium oxide (MAG-OX) tablet 800 mg, 800 mg, Oral, BID, Azucena Freed, MD, 800 mg at 04/19/18 1104  ???  mirtazapine (REMERON) tablet 45 mg, 45 mg, Oral, Nightly, Mackey Birchwood, MD, 45 mg at 04/18/18 2101  ???  MVW Complete (pediatric multivit 61-D3-vit K) 1,500-800 unit-mcg 2 capsule, 2 capsule, Oral, Daily, Mackey Birchwood, MD, 2 capsule at 04/19/18   ???  pantoprazole (PROTONIX) EC tablet 40 mg, 40 mg, Oral, BID with meals, Mackey Birchwood, MD, 40 mg at 04/19/18 0903  ???  phytonadione (vitamin K1) (MEPHYTON) tablet 5 mg, 5 mg, Oral, Q W and Sa, Amit Ringel, MD, 5 mg at 04/16/18 0825  ???  polyethylene glycol (MIRALAX) packet 17 g, 17 g, Oral, BID, Mackey Birchwood, MD, 17 g at 04/06/18 1610  ???  polyethylene glycol (MIRALAX) packet 17 g, 17 g, Oral, TID PRN, Mackey Birchwood, MD  ???  senna (SENOKOT) tablet 1 tablet, 1 tablet, Oral, Nightly, Darliss Cheney Nugent, MD  ???  [COMPLETED] thiamine (B-1) tablet 200 mg, 200 mg, Oral, Q8H SCH, 200 mg at 04/10/18 1420 **FOLLOWED BY** thiamine (B-1) tablet 200 mg, 200 mg, Oral, Daily, Mackey Birchwood, MD, 200 mg at 04/19/18 0902    CF Nutrition related labs (inpatient): PT normal on admission, recent potassium normal with lower glucose and 2gm diet restriction  Potassium   Date Value Ref Range Status   04/19/2018 4.6 3.5 - 5.0 mmol/L Final   04/18/2018 4.3 3.5 - 5.0 mmol/L Final   04/18/2018 5.0 3.5 - 5.0 mmol/L Final     Glucose, POC   Date Value Ref Range Status   04/19/2018 87 65 - 179 mg/dL Final   96/09/5407 76 65 - 179 mg/dL Final   81/19/1478 67 65 - 179 mg/dL Final   29/56/2130 865 (H) 65 - 179 mg/dL Final   78/46/9629 75 65 - 179 mg/dL Final   52/84/1324 87 65 - 179 mg/dL Final   40/03/2724 366 (HH) 65 - 179 mg/dL Final   44/08/4740 595 65 - 179 mg/dL Final   63/87/5643 329 (H) 65 - 179 mg/dL Final   51/88/4166 063 (H) 65 - 179 mg/dL Final   01/60/1093 235 (H) 65 - 179 mg/dL Final     ==================================================================  CLINICAL DATA:  Past Medical History:   Diagnosis Date   ??? Alcohol abuse    ??? 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 Bipolar disorder (CMS-HCC)    ??? Chronic  pancreatitis (CMS-HCC)    ??? Chronic sinusitis    ??? 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 Cystic fibrosis (CMS-HCC)    ??? Diabetes mellitus (CMS-HCC)     dx in-house 2015   ??? Disease of thyroid gland    ??? Hypertension    ??? Kidney stones    ??? Portal hypertension (CMS-HCC)      Anthroprometric Evaluation:  Weight changes: no new weight this week  Last 5 Recorded Weights    04/07/18 2125 04/12/18 2013   Weight: 76.2 kg (167 lb 14.4 oz) 75 kg (165 lb 6.4 oz)     BMI Readings from Last 3 Encounters:   04/12/18 22.43 kg/m??   03/28/18 20.56 kg/m??   02/28/18 21.93 kg/m??     Wt Readings from Last 12 Encounters:   04/12/18 75 kg (165 lb 6.4 oz)   03/28/18 70.7 kg (155 lb 12.8 oz)   02/28/18 75.4 kg (166 lb 3.2 oz)   02/01/18 76.2 kg (167 lb 15.9 oz)   01/05/18 67.9 kg (149 lb 9.6 oz)   11/21/17 65.4 kg (144 lb 2.9 oz)   09/29/17 67.6 kg (149 lb)   09/07/17 70.4 kg (155 lb 4.8 oz)   07/30/17 72.6 kg (160 lb)   07/11/17 80.9 kg (178 lb 4.8 oz)   06/30/17 74.8 kg (165 lb)   06/20/17 75.2 kg (165 lb 12.6 oz)     Ht Readings from Last 3 Encounters:   04/07/18 182.9 cm (6')   03/28/18 185.4 cm (6' 0.99)   02/28/18 185.4 cm (6' 0.99)   ==================================================================  Fat-soluble vitamin levels: low related to chronic non-compliance  Lab Results   Component Value Date/Time    VITAMINA 22.4 (L) 03/19/2016 1529     Lab Results   Component Value Date/Time    VITDTOTAL 23.2 01/29/2018 2048    VITDTOTAL 20.7 11/17/2017 1054    VITDTOTAL 9.5 (L) 04/20/2017 0531    VITDTOTAL 23.1 02/13/2017 0558    VITDTOTAL 8.8 (L) 02/02/2017 0534    VITDTOTAL 20 03/19/2016 1529     Lab Results   Component Value Date/Time    VITAME 4.5 (L) 03/19/2016 1529     Lab Results   Component Value Date/Time    PT 10.5 04/19/2018 0520    PT 10.4 04/18/2018 0625    PT 11.6 04/17/2018 0556    PT 10.7 04/16/2018 0603    PT 10.2 04/15/2018 0631    PT 11.1 10/31/2010 1115     Bone Health: Abnormal vitamin D level, being treated. 2011 DEXA showed normal bone density.    CF Related Diabetes: Yes. Prescribed insulin regimen, poor compliance  Lab Results   Component Value Date/Time    A1C 13.3 (H) 04/06/2018 0117    A1C 8.8 (H) 11/25/2015 1426    A1C 9.3 (H) 04/16/2014 1604

## 2018-04-19 NOTE — Unmapped (Signed)
Daily Progress Note    24hr Events/Interval History:     NAEON. Currently refusing PFTs. No nausea or vomiting, fever or chills.    Assessment/Plan:    Walter [name] is a 37 y.o. male who presented to [facility] with Cystic fibrosis with pulmonary exacerbation (CMS-HCC) and hyperglycemia without DKA.    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 *      CF with acute pulmonary exacerbation:  CF sputum cultures positive with mucoid PsA, achromobacter, smooth PsA on meropenem/cipro (10/22-11/5). DC Cefaroline 11/3 due to no MRSA. Will try to get PFTs however he is currently refusing. Can also discuss with Dr. Lurena Nida if he would like him to stay for prolonged treatment as he is nearing 2 week mark.   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 Stim test with normal response. Started on Flurinef for probable RTA and remains at higher end of normal for K level despite improved glucose control.  -low K diet 2 g daily   - frequent K checks  -increased florinef    CF related DM, hyperglycemia: A1C 10/19 13.3%, recent insulin adjustments with better controlled glucoses however is hypoglycemic in the morning  - continue current insulin regimen, 28 U Lantus, 14 U  Meal time, SSI  - endo recs appreciated  ??  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  - LFTs daily, stable  - CTM, abdominal ultrasound if changes in abdominal symptoms  - HCC and variceal screening on discharge  ??  CKD: likely has component of CKD at baseline with Cr fluctuating around 1.5. Stable today and will continue to monitor.   ??  Anxiety: struggles with anxiety and has a history of suicide attempt. We are continuing home meds which include Buspar and Trazodone. Have held home Gabapentin due to lethargy and asterixis.   ??  CF related pancreatic insufficiency: Zen pep regimen, vitamin regimen, vitamin K twice a week.  ??    Daily checklist:   Diet: low potassium  VTE ppx: heparin  GI ppx: PPI  Lytes: replete PRN to keep K>4 and Mag>2  IV Access: PIV  Code status: Full Code   Dispo: Likely inpatient for course of IV antibiotics  ___________________________________________________________________      Labs/Studies:  Labs and Studies from the last 24hrs per EMR and Reviewed          Objective:  Temp:  [36.1 ??C-36.7 ??C] 36.7 ??C  Heart Rate:  [76-106] 76  Resp:  [17-18] 17  BP: (110-119)/(78-80) 110/78  SpO2:  [94 %-96 %] 94 %    GENERAL: Chronically ill appearing gentleman laying in bed, NAD, resting comfortably  PULM: normal work of breathing, moving air  bilaterally  EXT: LUE PICC line intact,  No pedal edema.  NEURO: Grossly intact

## 2018-04-19 NOTE — Unmapped (Signed)
Patient refused treatment and assessments this shift.

## 2018-04-19 NOTE — Unmapped (Signed)
Patient remained afebrile this shift, denies any pain nor difficulty of breathing on room air, IV antibiotic given as scheduled, no adverse reactions noted. Will continue to monitor.  Problem: Adult Inpatient Plan of Care  Goal: Plan of Care Review  Outcome: Progressing  Goal: Patient-Specific Goal (Individualization)  Outcome: Progressing  Goal: Absence of Hospital-Acquired Illness or Injury  Outcome: Progressing  Goal: Optimal Comfort and Wellbeing  Outcome: Progressing  Goal: Readiness for Transition of Care  Outcome: Progressing  Goal: Rounds/Family Conference  Outcome: Progressing     Problem: Infection  Goal: Infection Symptom Resolution  Outcome: Progressing     Problem: Infection (Cystic Fibrosis)  Goal: Absence of Infection Signs/Symptoms  Outcome: Progressing     Problem: Respiratory Compromise (Cystic Fibrosis)  Goal: Effective Oxygenation and Ventilation  Outcome: Progressing     Problem: Diabetes Comorbidity  Goal: Blood Glucose Level Within Desired Range  Outcome: Progressing

## 2018-04-20 LAB — COMPREHENSIVE METABOLIC PANEL
ALBUMIN: 3.1 g/dL — ABNORMAL LOW (ref 3.5–5.0)
ALKALINE PHOSPHATASE: 410 U/L — ABNORMAL HIGH (ref 38–126)
ANION GAP: 9 mmol/L (ref 7–15)
AST (SGOT): 50 U/L (ref 19–55)
BILIRUBIN TOTAL: 0.2 mg/dL (ref 0.0–1.2)
BLOOD UREA NITROGEN: 16 mg/dL (ref 7–21)
BUN / CREAT RATIO: 9
CALCIUM: 8.1 mg/dL — ABNORMAL LOW (ref 8.5–10.2)
CHLORIDE: 101 mmol/L (ref 98–107)
CO2: 30 mmol/L (ref 22.0–30.0)
CREATININE: 1.83 mg/dL — ABNORMAL HIGH (ref 0.70–1.30)
EGFR CKD-EPI AA MALE: 54 mL/min/{1.73_m2} — ABNORMAL LOW (ref >=60)
EGFR CKD-EPI NON-AA MALE: 47 mL/min/{1.73_m2} — ABNORMAL LOW (ref >=60)
GLUCOSE RANDOM: 148 mg/dL (ref 65–179)
POTASSIUM: 4.7 mmol/L (ref 3.5–5.0)
SODIUM: 140 mmol/L (ref 135–145)

## 2018-04-20 LAB — CBC
HEMATOCRIT: 28.2 % — ABNORMAL LOW (ref 41.0–53.0)
HEMOGLOBIN: 9.1 g/dL — ABNORMAL LOW (ref 13.5–17.5)
MEAN CORPUSCULAR HEMOGLOBIN CONC: 32.4 g/dL (ref 31.0–37.0)
MEAN CORPUSCULAR HEMOGLOBIN: 28.5 pg (ref 26.0–34.0)
MEAN PLATELET VOLUME: 7.4 fL (ref 7.0–10.0)
PLATELET COUNT: 172 10*9/L (ref 150–440)
RED BLOOD CELL COUNT: 3.21 10*12/L — ABNORMAL LOW (ref 4.50–5.90)
RED CELL DISTRIBUTION WIDTH: 13.8 % (ref 12.0–15.0)
WBC ADJUSTED: 5 10*9/L (ref 4.5–11.0)

## 2018-04-20 LAB — PROTIME: Lab: 11.1

## 2018-04-20 LAB — PROTIME-INR: PROTIME: 11.1 s (ref 10.2–13.1)

## 2018-04-20 LAB — MAGNESIUM: Magnesium:MCnc:Pt:Ser/Plas:Qn:: 1.8

## 2018-04-20 LAB — EGFR CKD-EPI AA MALE: Lab: 54 — ABNORMAL LOW

## 2018-04-20 LAB — MEAN CORPUSCULAR HEMOGLOBIN: Lab: 28.5

## 2018-04-20 NOTE — Unmapped (Signed)
Patient remains alert and oriented. 2 meals eaten this shift. BG remains stable.  Planning for d/c home later today, continues to refuse PICC line dressing change         Problem: Adult Inpatient Plan of Care  Goal: Plan of Care Review  Outcome: Ongoing - Unchanged  Goal: Patient-Specific Goal (Individualization)  Outcome: Ongoing - Unchanged  Goal: Absence of Hospital-Acquired Illness or Injury  Outcome: Ongoing - Unchanged  Goal: Optimal Comfort and Wellbeing  Outcome: Ongoing - Unchanged  Goal: Readiness for Transition of Care  Outcome: Ongoing - Unchanged  Goal: Rounds/Family Conference  Outcome: Ongoing - Unchanged     Problem: Infection  Goal: Infection Symptom Resolution  Outcome: Ongoing - Unchanged     Problem: Infection (Cystic Fibrosis)  Goal: Absence of Infection Signs/Symptoms  Outcome: Ongoing - Unchanged     Problem: Respiratory Compromise (Cystic Fibrosis)  Goal: Effective Oxygenation and Ventilation  Outcome: Ongoing - Unchanged     Problem: Diabetes Comorbidity  Goal: Blood Glucose Level Within Desired Range  Outcome: Ongoing - Unchanged

## 2018-04-20 NOTE — Unmapped (Signed)
PICC LINE REMOVAL PROCEDURE NOTE    PICC line was removed per protocol.  Hemostasis was achieved.  Patient tolerated procedure well.  Gauze and transparent dressing placed.  Verbal and written site care instructions were provided.  Patient verbalized understanding.      PICC tip intact.  Measurement upon removal corresponds with insertion record    PICC tip was not sent for culture.      Thank You,    Marylee Floras RN [facility] Access Team [phone]     Workup Time:  15 minutes

## 2018-04-20 NOTE — Unmapped (Signed)
Refused respiratory services this shift

## 2018-04-20 NOTE — Unmapped (Signed)
Physician Discharge Summary    Admit date: 04/06/2018    Discharge date and time: 04/20/2018    Discharge to: Home    Discharge Service: Pulmonology (MDG)    Discharge Attending Physician: Nyra Jabs, MD    Discharge Diagnoses: CHF Exacerbation & HHS    Procedures: PFT      Hospital Course:  Walter [name] is a 37 y.o. 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    HHS, CF-related diabetes mellitus: Follows with endocrinology. A1c 9.6% (12/2017). Reports very poor adherence to insulin regimen, occasionally takes lantus but doesn't take any other insulin. On presentation, glucose 1,190 with elevated anion gap 16 and elevated beta-hydroxybutyrate. A1C 10/19 13.3%, recent insulin adjustments with better controlled glucoses.Continue  28 U Lantus QHS at home.    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 Lurena Nida. FEV1 35% (09/2017). Prior cultures with mucoid and smooth PsA, H flu, achromobacter. CXR on admission without focal consolidation.  Cultures this admission grew achromobacter and Mucoid + Smooth Pseudomonas aeruginosa. Fluronef was increased. Pt had CRP of 40 on admission, downtrending to 12 prior to discharge.   PFTs preformed while inpatient on 10/28 and showed FEV1 of 29.3%, patient refused repeat PFT prior to discharge.  ??  CKD & Hyperkalemia: likely has component of CKD at baseline with Cr fluctuating around 1.5. Stable today and will continue to monitor. Hyperkalemia thought to be 2/2 recurrent episodes of hyperglycemia and CKD type IV RTA. Stim test with normal response. Started on Flurinef for probable RTA and remains at higher end of normal for K level despite improved glucose control.  ??  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 LFTs were stable throughout admission.     ??Anxiety:??struggles with anxiety and has a history of suicide attempt. We are continuing home meds which include Buspar and Trazodone. Have held home Gabapentin due to lethargy and asterixis.     CF related pancreatic insufficiency:??Zen pep regimen, vitamin regimen, vitamin K twice a week.    Prior to discharge patient was counseled that he had follow-up appointments with pulmonology on May 05, 2018, 10:15 for PFTs and 10:30 with the physician assistant.         Condition at Discharge: fair  Discharge Medications:      Your Medication List      START taking these medications    fludrocortisone 0.1 mg tablet  Commonly known as:  FLORINEF  Take 1 tablet (0.1 mg total) by mouth daily.  Start taking on:  April 21, 2018        CONTINUE taking these medications    albuterol 2.5 mg /3 mL (0.083 %) nebulizer solution  Inhale 2.5 mg by nebulization 2 (two) times a day.     busPIRone 15 MG tablet  Commonly known as:  BUSPAR  Take 1 tablet (15 mg total) by mouth Three (3) times a day.     gabapentin 300 MG capsule  Commonly known as:  NEURONTIN  Take 4 capsules (1200mg ) in the morning and 3 capsule (900mg ) at 12p and bedtime.     ibuprofen 200 MG tablet  Commonly known as:  ADVIL,MOTRIN  Take 800 mg by mouth 4 (four) times a day as needed.     insulin glargine 100 unit/mL  injection  Commonly known as:  LANTUS  Inject 24 Units under the skin nightly.     LC PLUS Misc  Generic drug:  nebulizers  Pari LC Plus Reusable Cup     lidocaine 5 % patch  Commonly known as:  LIDODERM  Place 1 patch on the skin daily. Apply to affected area for 12 hours only each day (then remove patch)     magnesium oxide 400 mg (241.3 mg magnesium) tablet  Commonly known as:  MAG-OX  Take 2 tablets (800 mg total) by mouth daily.     melatonin 3 mg Tab  Take 2 tablets (6 mg total) by mouth every evening.     mirtazapine 45 MG tablet Commonly known as:  REMERON  Take 1 tablet (45 mg total) by mouth nightly.     MVW Complete (pediatric multivit 61-D3-vit K) 1,500-800 unit-mcg Cap  Take 2 capsules by mouth daily.     OLANZapine 15 MG tablet  Commonly known as:  ZyPREXA  Take 1 tablet (15 mg total) by mouth nightly.     pantoprazole 40 MG tablet  Commonly known as:  PROTONIX  Take 1 tablet (40 mg total) by mouth Two (2) times a day.     PULMOZYME 1 mg/mL nebulizer solution  Generic drug:  dornase alfa  Inhale the contents of 1 vial (2.5 mg) via nebulizer once daily.     sodium chloride 7% 7 % Nebu  Inhale 4 mL by nebulization two (2) times a day.     traZODone 50 MG tablet  Commonly known as:  DESYREL  Take 1 tablet (50 mg total) by mouth nightly.     ZENPEP 20,000-63,000- 84,000 unit Cpdr capsule, delayed release  Generic drug:  lipase-protease-amylase  Take 12 capsules by mouth Three (3) times a day with a meal. 12 caps with meals and 3 caps with snacks            Pending Test Results:      Order Current Status    AFB culture Preliminary result    Fungal Culture Preliminary result          Discharge Instructions:   Activity Instructions     Activity as tolerated            Follow Up instructions and Outpatient Referrals     Call MD for:  persistent dizziness or light-headedness      Call MD for:  persistent nausea or vomiting      Call MD for:  redness, tenderness, or signs of infection (pain, swelling, redness, odor or green/yellow discharge around incision site)      Call MD for: Temperature > 38.5 Celsius ( > 101.3 Fahrenheit)      Discharge instructions      -You have follow-up appointments with pulmonology on May 05, 2018, at 10:15AM for PFTs and 10:30AM with the physician assistant.             Appointments which have been scheduled for you    May 05, 2018 10:15 AM EST  (Arrive by 10:00 AM)  RETURN PFT 15 with [facility] PFT 1  [facility] PULMONARY SPECIALTY FUNCT [place] [facility]) [address]  [address]  [place] [address]  [phone]      May 05, 2018 10:30 AM EST  (Arrive by 10:15 AM)  RETURN CYSTIC FIBROSIS with Durenda Hurt Rupcich, PA  [facility] PULMONARY SPECIALTY CL [place])  [address]  [address]  [place] [address]  [phone]      May 09, 2018 12:30 PM EST  (Arrive by 12:15 PM)  RETURN  STEP with Rosezetta Schlatter, PMHNP  [facility] PSYCHIATRY STEP [place] [facility]) [address] [place] [address]  [phone]              I spent greater than 30 minutes in the discharge of this patient.

## 2018-04-20 NOTE — Unmapped (Signed)
Endocrinology Consult Follow Up Note    Requesting Attending Physician :  Nyra Jabs, MD  Service Requesting Consult : Pulmonology (MDG)  Primary Care Provider: Dellis Filbert, MD  Outpatient Endocrinologist: N/A    Assessment/Recommendations:    Walter [name] is a 37 y.o. 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 I have been asked to see in consultation at the request of [name] MD for evaluation of CFRD.    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 Complicated by infection and noncompliance. Presented in DKA and now off IV insulin infusion. Has had variable glucoses due to late night eating/snacking and not asking for insulin coverage. Patient encouraged to ask for insulin prior to meals/snacks. Glycemic control improving. Recommendations are as follows:  -Continue Lantus 28units qhs  -Continue Lispro 12 units with meals up to 5x per day to cover for late night meals. Hold if NPO, half dose if small meal  -Continue standard correction scale 2:50>150    Hyperkalemia: Differential is broad. 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 Adrenal insufficiency has been ruled out with ACTH stim from 5.3 to 22.8. Fludrocortisone empirically started.    The patient was discussed with Dr.Harris    Please call/page [phone] with questions or concerns.     Farrel Conners, MD  [facility] Endocrinology Fellow    ------------------------------------------------------------    History of Present Illness:     Reason for Consult: Hyperglycemia    Walter [name] is a 37 y.o. male admitted for Cystic fibrosis with pulmonary exacerbation (CMS-HCC). I have been asked to evaluate [facility] for CFRD. He has been started on IV antibiotics. He is not on any steroids for this exacerbation.     Interval history: patient reports feeling okay. He denies any symptoms. Glycemic control is improving.     History from initial encounter:  Patient is well known to the consult service from prior admissions. He was last seen by our team on 02/03/18 at which point he was receiving Lantus 30 units qhs, Lispro 14 units with meals and on a standard correction scale. His most recent A1c is 9.6% (01/12/18). Patient reports DM medication noncompliance as he was focusing on trying to control his CF. He acknowledged that he takes up to 18 units of lispro with meals and 30 units of lantus at home but is not consistent. He presented with BG >1000 and an elevated anion gap. He received 10 units of regular insulin overnight and 15 units of NOH this morning. He was briefly placed on IV insulin gtt and his anion gap closed. He reports good appetite. Thus, we will restart a prior known in-hospital regimen that worked.    Past Medical History:    Medical History:    Past Medical History:   Diagnosis Date   ??? Alcohol abuse    ??? 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 Bipolar disorder (CMS-HCC)    ??? Chronic pancreatitis (CMS-HCC)    ??? Chronic sinusitis    ??? 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 Cystic fibrosis (CMS-HCC)    ??? Diabetes mellitus (CMS-HCC)     dx in-house 2015   ??? Disease of thyroid gland    ??? Hypertension    ??? Kidney stones    ??? Portal hypertension (CMS-HCC)        Surgical History:    Past Surgical History:  Procedure Laterality Date   ??? CHOLECYSTECTOMY  2008   ??? sinus surgery     ??? TONSILLECTOMY       Allergies:  Patient has no known allergies.    All Medications:   Current Facility-Administered Medications   Medication Dose Route Frequency Provider Last Rate Last Dose   ??? acetaminophen (TYLENOL) tablet 500 mg  500 mg Oral Q6H PRN Azucena Freed, MD       ??? albuterol 2.5 mg /3 mL (0.083 %) nebulizer solution 2.5 mg  2.5 mg Nebulization 4x Daily (RT) Mackey Birchwood, MD   2.5 mg at 04/20/18 1118   ??? busPIRone (BUSPAR) tablet 15 mg  15 mg Oral TID Mackey Birchwood, MD   15 mg at 04/20/18 0904   ??? cholecalciferol (vitamin D3) tablet 7,500 Units  7,500 Units Oral Daily Mackey Birchwood, MD   7,500 Units at 04/20/18 0905   ??? insulin regular (HumuLIN,NovoLIN) injection 10 Units  10 Units Intravenous Once Azucena Freed, MD   Stopped at 04/13/18 0920    Followed by   ??? dextrose (D10W) 10% bolus 125 mL  12.5 g Intravenous Once Azucena Freed, MD   Stopped at 04/13/18 0920   ??? dextrose (D10W) 10% bolus 250 mL  25 g Intravenous Q30 Min PRN Wyatt Mage, MD   Stopped at 04/19/18 [id number]   ??? dornase alfa (PULMOZYME) 1 mg/mL nebulizer solution 2.5 mg  2.5 mg Inhalation Daily (RT) Mackey Birchwood, MD   2.5 mg at 04/20/18 1118   ??? fludrocortisone (FLORINEF) tablet 100 mcg  100 mcg Oral Daily Sharyon Medicus, MD   100 mcg at 04/20/18 0904   ??? folic acid (FOLVITE) tablet 1 mg  1 mg Oral Daily Mackey Birchwood, MD   1 mg at 04/20/18 0905   ??? heparin (porcine) injection 5,000 Units  5,000 Units Subcutaneous [facility] Mackey Birchwood, MD   5,000 Units at 04/10/18 2025   ??? heparin, porcine (PF) 100 unit/mL injection 200 Units  200 Units Intravenous Daily PRN Azucena Freed, MD   200 Units at 04/19/18 1819   ??? hydrocortisone 1 % cream   Topical Daily PRN Mackey Birchwood, MD       ??? insulin glargine (LANTUS) injection 28 Units  28 Units Subcutaneous Nightly Azucena Freed, MD   28 Units at 04/19/18 2027   ??? insulin lispro (HumaLOG) injection 0-12 Units  0-12 Units Subcutaneous ACHS Wyatt Mage, MD   2 Units at 04/19/18 2019   ??? insulin lispro (HumaLOG) injection 14 Units  14 Units Subcutaneous 5XD insulin Azucena Freed, MD   14 Units at 04/20/18 0910   ??? ketoconazole (NIZORAL) 2 % cream 1 application  1 application Topical Daily Mackey Birchwood, MD   1 application at 04/19/18 0904   ??? lactulose (CHRONULAC) oral solution (30 mL cup)  20 g Oral Once PRN Wyatt Mage, MD       ??? lidocaine (LIDODERM) 5 % patch 2 patch  2 patch Transdermal Q24H Mackey Birchwood, MD   Stopped at 04/07/18 0941   ??? lipase-protease-amylase (ZENPEP) 20,000-63,000- 84,000 unit capsule, delayed release 240,000 units of lipase  12 capsule Oral 3xd Meals Mackey Birchwood, MD   240,000 units of lipase at 04/20/18 0910   ??? magnesium oxide (MAG-OX) tablet 800 mg  800 mg Oral BID Azucena Freed, MD   800 mg at 04/20/18 1203   ??? melatonin tablet 3 mg  3 mg Oral QPM Amit  Ringel, MD   3 mg at 04/19/18 2020   ??? mirtazapine (REMERON) tablet 45 mg  45 mg Oral Nightly Mackey Birchwood, MD   45 mg at 04/19/18 2020   ??? MVW Complete (pediatric multivit 61-D3-vit K) 1,500-800 unit-mcg 2 capsule  2 capsule Oral Daily Mackey Birchwood, MD   2 capsule at 04/20/18 1203   ??? OLANZapine (ZYPREXA) tablet 15 mg  15 mg Oral Nightly Mackey Birchwood, MD   15 mg at 04/19/18 2027   ??? OLANZapine (ZYPREXA) tablet 2.5 mg  2.5 mg Oral BID PRN Azucena Freed, MD       ??? oxyCODONE (ROXICODONE) 5 MG immediate release tablet            ??? pantoprazole (PROTONIX) EC tablet 40 mg  40 mg Oral BID with meals Mackey Birchwood, MD   40 mg at 04/20/18 0904   ??? phytonadione (vitamin K1) (MEPHYTON) tablet 5 mg  5 mg Oral Q W and Sa Mackey Birchwood, MD   5 mg at 04/20/18 0904   ??? polyethylene glycol (MIRALAX) packet 17 g  17 g Oral BID Mackey Birchwood, MD   17 g at 04/06/18 1610   ??? polyethylene glycol (MIRALAX) packet 17 g  17 g Oral TID PRN Mackey Birchwood, MD       ??? senna (SENOKOT) tablet 1 tablet  1 tablet Oral Nightly Darliss Cheney Nugent, MD       ??? sodium chloride 7% nebulizer solution 4 mL  4 mL Nebulization 4x Daily (RT) Mackey Birchwood, MD   4 mL at 04/20/18 1119   ??? thiamine (B-1) tablet 200 mg  200 mg Oral Daily Mackey Birchwood, MD   200 mg at 04/20/18 0904   ??? traZODone (DESYREL) tablet 50 mg  50 mg Oral Nightly Mackey Birchwood, MD   50 mg at 04/19/18 2020       Social History:     Social History     Socioeconomic History   ??? Marital status: Single     Spouse name: None   ??? Number of children: 0   ??? Years of education: 12+   ??? Highest education level: Bachelor's degree (e.g., BA, AB, BS)   Occupational History   ??? Occupation: Restaurant manager, fast food     Comment: works at [employer]   ??? Financial resource strain: Very hard   ??? Food insecurity:     Worry: Often true     Inability: Often true   ??? Transportation needs:     Medical: No     Non-medical: No   Tobacco Use   ??? Smoking status: Former Smoker     Packs/day: 1.00     Years: 3.00     Pack years: 3.00     Types: Cigarettes   ??? Smokeless tobacco: Never Used   ??? Tobacco comment: reports quit prior to last admission   Substance and Sexual Activity   ??? Alcohol use: Yes     Alcohol/week: 0.0 standard drinks     Comment: A fifth of Souther per day    ??? Drug use: Not Currently     Comment: hx of alcohol and prescription pain medication abuse   ??? Sexual activity: Not Currently     Partners: Female   Lifestyle   ??? Physical activity:     Days per week: 0 days     Minutes per session: 0 min   ??? Stress: Very much   Relationships   ???  Social connections:     Talks on phone: Twice a week     Gets together: Once a week     Attends religious service: Never     Active member of club or organization: No     Attends meetings of clubs or organizations: Never     Relationship status: Never married   Other Topics Concern   ??? 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 Hillard Danker with [facility] in [place]    Current therapist: Nehemiah Settle with CF team        SUBSTANCE ABUSE HX:     # MJ     -Current use: once in awhile    # Alcohol     -Current use: Was sober from 2016 until two weeks ago. Admits to drinking enough to get drunk and admits to passing out. Denies history of complicated withdrawal. Admits to withdrawal symptoms of tremors, anxiety, headache, and tactile hallucinations at times.     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     Physical Exam:  Constitutional: alert, no acute distress  ENT: anicteric sclera  Resp: clear to auscultation, symmetric air entry  MSK: no obvious joint deformity  Neurological: awake, oriented to person, place, and time, normal speech.  Lymph:no pedal edema  Skin: normal coloration and turgor, no rashes  Psych: flat affect    Test Results    Data Review  Lab Results   Component Value Date    POCGLU 217 (H) 04/20/2018    POCGLU 121 04/20/2018    POCGLU 128 04/20/2018    POCGLU 73 04/19/2018    POCGLU 190 (H) 04/19/2018  POCGLU 87 04/19/2018    POCGLU 76 04/19/2018       Lab Results   Component Value Date    A1C 13.3 (H) 04/06/2018    A1C 9.6 (H) 01/12/2018    A1C 11.9 (H) 11/17/2017     Lab Results   Component Value Date    GFR >= 60 10/31/2010    CREATININE 1.83 (H) 04/20/2018     Lab Results   Component Value Date    CHOL 116 01/29/2018     Lab Results   Component Value Date    HDL 61 (H) 01/29/2018     Lab Results   Component Value Date    LDL 35 (L) 01/29/2018     Lab Results   Component Value Date    TRIG 102 01/29/2018

## 2018-04-20 NOTE — Unmapped (Signed)
Patient has been discharged from the hospital.     Per MD Note:  Prior to discharge patient was counseled that he had follow-up appointments with pulmonology on May 05, 2018, 10:15 for PFTs and 10:30 with the physician assistant.

## 2018-04-20 NOTE — Unmapped (Signed)
Patient remained afebrile this shift, denies any pain nor difficulty of breathing on room air, IV antibiotic given as scheduled, no adverse reactions noted. Patient refused PICC dressing change.Will continue to monitor.  Problem: Adult Inpatient Plan of Care  Goal: Plan of Care Review  Outcome: Progressing  Goal: Patient-Specific Goal (Individualization)  Outcome: Progressing  Goal: Absence of Hospital-Acquired Illness or Injury  Outcome: Progressing  Goal: Optimal Comfort and Wellbeing  Outcome: Progressing  Goal: Readiness for Transition of Care  Outcome: Progressing  Goal: Rounds/Family Conference  Outcome: Progressing     Problem: Infection  Goal: Infection Symptom Resolution  Outcome: Progressing     Problem: Infection (Cystic Fibrosis)  Goal: Absence of Infection Signs/Symptoms  Outcome: Progressing     Problem: Respiratory Compromise (Cystic Fibrosis)  Goal: Effective Oxygenation and Ventilation  Outcome: Progressing     Problem: Diabetes Comorbidity  Goal: Blood Glucose Level Within Desired Range  Outcome: Progressing

## 2018-04-21 MED ORDER — FLUDROCORTISONE 0.1 MG TABLET
ORAL_TABLET | Freq: Every day | ORAL | 1 refills | 0.00000 days | Status: CP
Start: 2018-04-21 — End: 2018-04-29

## 2018-04-22 NOTE — Unmapped (Signed)
Left message with Romolo to discuss how we might be able to get a prescribed medication, fludrocortisone, to him. Sent MyChart message as well. Will await Dakai's reply.     [name], RN  CF Nurse Coordinator   [phone]

## 2018-04-26 ENCOUNTER — Encounter: Admit: 2018-04-26 | Discharge: 2018-04-27 | Payer: PRIVATE HEALTH INSURANCE

## 2018-04-26 DIAGNOSIS — R109 Unspecified abdominal pain: Principal | ICD-10-CM

## 2018-04-26 LAB — CBC W/ AUTO DIFF
BASOPHILS ABSOLUTE COUNT: 0 10*9/L (ref 0.0–0.1)
BASOPHILS RELATIVE PERCENT: 0.4 %
EOSINOPHILS RELATIVE PERCENT: 3.1 %
HEMOGLOBIN: 10.6 g/dL — ABNORMAL LOW (ref 13.5–17.5)
LARGE UNSTAINED CELLS: 2 % (ref 0–4)
LYMPHOCYTES ABSOLUTE COUNT: 1.5 10*9/L (ref 1.5–5.0)
LYMPHOCYTES RELATIVE PERCENT: 21.9 %
MEAN CORPUSCULAR HEMOGLOBIN CONC: 33.1 g/dL (ref 31.0–37.0)
MEAN CORPUSCULAR HEMOGLOBIN: 28.3 pg (ref 26.0–34.0)
MEAN CORPUSCULAR VOLUME: 85.6 fL (ref 80.0–100.0)
MEAN PLATELET VOLUME: 7.3 fL (ref 7.0–10.0)
MONOCYTES ABSOLUTE COUNT: 0.2 10*9/L (ref 0.2–0.8)
MONOCYTES RELATIVE PERCENT: 3.3 %
NEUTROPHILS RELATIVE PERCENT: 69.5 %
PLATELET COUNT: 233 10*9/L (ref 150–440)
RED BLOOD CELL COUNT: 3.73 10*12/L — ABNORMAL LOW (ref 4.50–5.90)
RED CELL DISTRIBUTION WIDTH: 13.8 % (ref 12.0–15.0)
WBC ADJUSTED: 7.1 10*9/L (ref 4.5–11.0)

## 2018-04-26 LAB — LIPASE
LIPASE: <10 U/L — ABNORMAL LOW (ref 44–232)
Triacylglycerol lipase:CCnc:Pt:Ser/Plas:Qn:: <10 — ABNORMAL LOW

## 2018-04-26 LAB — COMPREHENSIVE METABOLIC PANEL
ALKALINE PHOSPHATASE: 389 U/L — ABNORMAL HIGH (ref 38–126)
ALT (SGPT): 26 U/L (ref <50)
ANION GAP: 16 mmol/L — ABNORMAL HIGH (ref 7–15)
AST (SGOT): 37 U/L (ref 19–55)
BILIRUBIN TOTAL: 0.6 mg/dL (ref 0.0–1.2)
BLOOD UREA NITROGEN: 12 mg/dL (ref 7–21)
CALCIUM: 8.8 mg/dL (ref 8.5–10.2)
CHLORIDE: 102 mmol/L (ref 98–107)
CO2: 23 mmol/L (ref 22.0–30.0)
CREATININE: 1.48 mg/dL — ABNORMAL HIGH (ref 0.70–1.30)
EGFR CKD-EPI AA MALE: 70 mL/min/{1.73_m2} (ref >=60)
EGFR CKD-EPI NON-AA MALE: 60 mL/min/{1.73_m2} (ref >=60)
GLUCOSE RANDOM: 150 mg/dL (ref 65–179)
POTASSIUM: 3.8 mmol/L (ref 3.5–5.0)
SODIUM: 141 mmol/L (ref 135–145)

## 2018-04-26 LAB — PHOSPHORUS: Phosphate:MCnc:Pt:Ser/Plas:Qn:: 3.7

## 2018-04-26 LAB — PLATELET COUNT: Lab: 233

## 2018-04-26 LAB — ANION GAP: Anion gap 3:SCnc:Pt:Ser/Plas:Qn:: 16 — ABNORMAL HIGH

## 2018-04-26 LAB — MAGNESIUM: Magnesium:MCnc:Pt:Ser/Plas:Qn:: 1.6

## 2018-04-26 NOTE — Unmapped (Signed)
[facility]  Emergency Department Provider Note    ED Clinical Impression     Final diagnoses:   None       Initial Impression, ED Course, Assessment and Plan     Impression: this is a 37 yo w/ CF, [facility]:45 last night after his pain medicine was restricted who presents with nausea, vomiting, and epigastric pain. He states it feels like pancreatitis.    Vomiting in the room. Giving zofran and dilaudid while labs are pending.    Malingering vs pancreatitis vs gastritis.    On reevaluation, pt still in pain, requesting more dilaudid and reglan.    Signed out to oncoming resident, [employer].         Additional Medical Decision Making     I have reviewed the vital signs and the nursing notes. Labs and radiology results that were available during my care of the patient were independently reviewed by me and considered in my medical decision making.     I staffed the case with the ED attending, Dr. Matilde Haymaker.    I independently visualized the EKG tracing.   I independently visualized the radiology images.   I reviewed the patient's prior medical records.    Portions of this record have been created using Scientist, clinical (histocompatibility and immunogenetics). Dictation errors have been sought, but may not have been identified and corrected.  ____________________________________________       History     Chief Complaint  Abdominal Pain      HPI   Walter [name] is a 36 y.o. male with a history significant for alcohol use, CF, pancreatitis, and cirrhosis who presents with abdominal pain in the epigastric area with associated vomiting.    He was recently discharged for the same from [facility] last night.  After being denied pain medicines he was discharged and came to [facility].    He states that he usually feels like this after he drinks.  His last drink was 2 days ago.    He denies difficulty breathing, diarrhea, constipation, chest pain.      Past Medical History:   Diagnosis Date   ??? Alcohol abuse    ??? 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 Bipolar disorder (CMS-HCC)    ??? Chronic pancreatitis (CMS-HCC)    ??? Chronic sinusitis    ??? 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 Cystic fibrosis (CMS-HCC)    ??? Diabetes mellitus (CMS-HCC)     dx in-house 2015   ??? Disease of thyroid gland    ??? Hypertension    ??? Kidney stones    ??? Portal hypertension (CMS-HCC)        Patient Active Problem List   Diagnosis   ??? Cirrhosis (CMS-HCC)   ??? Essential hypertension (RAF-HCC)   ??? Internal hemorrhoids   ??? Chronic abdominal pain   ??? Chronic pancreatitis (CMS-HCC)   ??? Cystic fibrosis exacerbation (CMS-HCC)   ??? Dysthymic disorder   ??? 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 Intentional drug overdose (CMS-HCC)   ??? ETOH abuse   ??? Bipolar disorder, most recent episode depressed (CMS-HCC)   ??? Major depressive disorder   ??? Vitamin D deficiency   ??? Thyroid goiter (RAF-HCC)   ??? Tobacco use   ??? DIOS (distal intestinal obstruction syndrome) (CMS-HCC)   ??? Elevated blood pressure reading   ??? Abdominal pain   ??? Anxiety   ??? Cystic fibrosis  with pulmonary exacerbation (CMS-HCC)   ??? Suicidal ideation   ??? Alcoholic intoxication without complication (CMS-HCC)   ??? Opioid use disorder, severe, dependence (CMS-HCC)   ??? Cystic fibrosis with liver disease (CMS-HCC)   ??? Bronchiectasis with acute exacerbation (CMS-HCC)   ??? Bipolar affective disorder in remission (CMS-HCC)   ??? Adjustment disorder with mixed disturbance of emotions and conduct   ??? Malnutrition (CMS-HCC)   ??? 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 Depression   ??? Alcohol use disorder, severe, in early remission, in controlled environment (CMS-HCC)   ??? Anxiety disorder, unspecified (r/o generalized anxiety disorder)   ??? Insomnia   ??? Substance induced mood disorder (CMS-HCC)   ??? HHS (hypothenar hammer syndrome) (CMS-HCC)       Past Surgical History:   Procedure Laterality Date   ??? CHOLECYSTECTOMY  2008   ??? sinus surgery     ??? TONSILLECTOMY         No current facility-administered medications for this encounter.     Current Outpatient Medications:   ???  albuterol 2.5 mg /3 mL (0.083 %) nebulizer solution, Inhale 2.5 mg by nebulization 2 (two) times a day., Disp: , Rfl:   ???  busPIRone (BUSPAR) 15 MG tablet, Take 1 tablet (15 mg total) by mouth Three (3) times a day., Disp: 90 tablet, Rfl: 1  ???  dornase alfa (PULMOZYME) 1 mg/mL nebulizer solution, Inhale the contents of 1 vial (2.5 mg) via nebulizer once daily., Disp: 225 mL, Rfl: 3  ???  fludrocortisone (FLORINEF) 0.1 mg tablet, Take 1 tablet (0.1 mg total) by mouth daily., Disp: 30 tablet, Rfl: 1  ???  gabapentin (NEURONTIN) 300 MG capsule, Take 4 capsules (1200mg ) in the morning and 3 capsule (900mg ) at 12p and bedtime., Disp: 300 capsule, Rfl: 1  ???  ibuprofen (ADVIL,MOTRIN) 200 MG tablet, Take 800 mg by mouth 4 (four) times a day as needed. , Disp: , Rfl:   ???  insulin glargine (LANTUS) 100 unit/mL injection, Inject 24 Units under the skin nightly., Disp: , Rfl:   ???  lidocaine (LIDODERM) 5 % patch, Place 1 patch on the skin daily. Apply to affected area for 12 hours only each day (then remove patch), Disp: , Rfl:   ???  lipase-protease-amylase (ZENPEP) 20,000-63,000- 84,000 unit CpDR capsule, delayed release, Take 12 capsules by mouth Three (3) times a day with a meal. 12 caps with meals and 3 caps with snacks, Disp: , Rfl:   ???  magnesium oxide (MAG-OX) 400 mg (241.3 mg magnesium) tablet, Take 2 tablets (800 mg total) by mouth daily., Disp: 60 tablet, Rfl: 11  ???  melatonin 3 mg Tab, Take 2 tablets (6 mg total) by mouth every evening., Disp: 30 tablet, Rfl: 0  ???  mirtazapine (REMERON) 45 MG tablet, Take 1 tablet (45 mg total) by mouth nightly., Disp: 30 tablet, Rfl: 1  ???  MVW Complete, pediatric multivit 61-D3-vit K, 1,500-800 unit-mcg cap, Take 2 capsules by mouth daily., Disp: 60 each, Rfl: 1  ???  nebulizers (LC PLUS) Misc, Pari LC Plus Reusable Cup, Disp: 2 each, Rfl: 11  ???  OLANZapine (ZYPREXA) 15 MG tablet, Take 1 tablet (15 mg total) by mouth nightly., Disp: 30 tablet, Rfl: 1  ???  pantoprazole (PROTONIX) 40 MG tablet, Take 1 tablet (40 mg total) by mouth Two (2) times a day., Disp: 180 tablet, Rfl: 2  ???  sodium chloride 7% 7 % Nebu, Inhale 4 mL by nebulization two (2)  times a day., Disp: 720 mL, Rfl: 3  ???  traZODone (DESYREL) 50 MG tablet, Take 1 tablet (50 mg total) by mouth nightly., Disp: 30 tablet, Rfl: 1    Allergies  Patient has no known allergies.    Family History   Problem Relation Age of Onset   ??? Diabetes Maternal Grandmother    ??? Diabetes Maternal Grandfather    ??? No Known Problems Mother    ??? Cancer Father    ??? No Known Problems Sister        Social History  Social History     Tobacco Use   ??? Smoking status: Former Smoker     Packs/day: 1.00     Years: 3.00     Pack years: 3.00     Types: Cigarettes   ??? Smokeless tobacco: Never Used   ??? Tobacco comment: reports quit prior to last admission   Substance Use Topics   ??? Alcohol use: Yes     Alcohol/week: 0.0 standard drinks     Comment: A fifth of Souther per day    ??? Drug use: Not Currently     Comment: hx of alcohol and prescription pain medication abuse       Review of Systems  Constitutional: Negative for fever.  Eyes: Negative for visual changes.  ENT: Negative for sore throat.  CV: Negative for chest pain.  Resp: Negative for shortness of breath.  GI: Positive for abdominal pain.  GU: Negative for dysuria.  MSK: Negative for back pain.  Derm: Negative for rash.  Neuro: Negative for headaches.      Physical Exam     ED Triage Vitals [04/26/18 0443]   Enc Vitals Group      BP 148/87      Heart Rate 72      SpO2 Pulse       Resp 18      Temp 36.8 ??C (98.2 ??F)      Temp Source Oral      SpO2 96 %     Constitutional: Appears stated age.  HEENT: Normocephalic and atraumatic.Conjunctivae clear. No congestion. Moist mucous membranes.   Heme/Lymph/Immuno: No petechiae or bruising  CV: RRR, no murmurs. Symmetric pulses in all extremities.  Resp: Clear to auscultation bilaterally. No wheezes or rhonchii  GI: Tender in epigastric area, soft  GU: There is no CVA tenderness.   MSK: Normal range of motion in all extremities.  Neuro: Normal speech and language. No gross focal neurologic deficits appreciated.  Skin: Warm, dry and intact.  Psychiatric: Mood and affect are normal. Speech and behavior are normal.       Sanjuana Kava., MD  Resident  04/26/18 [phone]

## 2018-04-26 NOTE — Unmapped (Signed)
Patient rounds completed. The following patient needs were addressed:  Pain, Toileting, Personal Belongings, Plan of Care, Call Bell in Reach and Bed Position Low .

## 2018-04-26 NOTE — Unmapped (Signed)
Patient rounds completed. The following patient needs were addressed:  Pain, Toileting, Call Bell in Reach and Bed Position Low .  Pt with abdominal for the last 3 days with N&V. HX of pancreatitis. He says he has had abdominal pain since he had been drinking the other day.

## 2018-04-26 NOTE — Unmapped (Signed)
Pt was caught with his fingers down his throat making himself vomit.

## 2018-04-26 NOTE — Unmapped (Signed)
Abdominal pain x3 days, +N/V. Pt unable to keep anything down due to vomiting. Pt hx of pancreatitis, states feels similar

## 2018-04-26 NOTE — Unmapped (Signed)
Bed: 79-D  Expected date:   Expected time:   Means of arrival:   Comments:  69

## 2018-04-26 NOTE — Unmapped (Signed)
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      Ethanol Abuse:  -Episodes of binge drinking  -Has brief peroids of sobriety (lasting a few months max)  -Has attended Alcoholics Anonymous, but has not received psych evaluation  -Consult psychiatry.     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 Lurena Nida.  -FEV1 of 29.3% (04/11/2018).  - Prior cultures??with mucoid and smooth PsA, H flu, achromobacter.   -Continue airway clearance per home regimen     Diabetes Mellitus 2/2 Cystic Fibrosis:  -Previously seen by endocrine inpatient  -blood sugar 130 on admission.   -Fingersticks ACHS  -ISS, 28U lantus QHS, lispro 12 units with meals    Chronic Kidney Disease:  -Basline Cr ~1.5  -Hyperkalemic on previous admissions, continue flurinef    Cirrhosis:  -CF, EtOHm and hepatitis contributory.  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  He says this is happened to him before specifically after episodes of binge drinking.  His pain has been severe, he presented initially to [facility] where he was discharged from the emergency department twice, he returned and was admitted but left AMA after taper of Ativan.    Upon presentation to [facility] he has not been able to keep down any food for the past 3 days, he is nauseous and has been vomiting bilious material.  He reports blood sugars have been good and has been passing stool, no diarrhea.  He continues to have abdominal pain which is centered in the epigastrium but also present in the right upper quadrant as well as right lower quadrant.    Allergies:  Patient has no known allergies.    Medications:   (Not in a hospital admission)      Medical History:  Past Medical History:   Diagnosis Date   ??? Alcohol abuse    ??? 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 Bipolar disorder (CMS-HCC)    ??? Chronic pancreatitis (CMS-HCC)    ??? Chronic sinusitis    ??? 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 Cystic fibrosis (CMS-HCC)    ??? Diabetes mellitus (CMS-HCC)     dx in-house 2015   ??? Disease of thyroid gland    ??? Hypertension    ???  Kidney stones    ??? Portal hypertension (CMS-HCC)        Surgical History:  Past Surgical History:   Procedure Laterality Date   ??? CHOLECYSTECTOMY  2008   ??? sinus surgery     ??? TONSILLECTOMY         Social History:  Tobacco use:   reports that he has quit smoking. His smoking use included cigarettes. He has a 3.00 pack-year smoking history. He has never used smokeless tobacco.  Alcohol use:   reports current alcohol use.  Drug use:  reports previous drug use.  Living situation: the patient lives with roommate.    Family History:  family history includes Cancer in his father; Diabetes in his maternal grandfather and maternal grandmother; No Known Problems in his mother and sister.    Code Status:  Full Code      Review of Systems:  Review of Systems   Constitutional: Positive for appetite change. Negative for diaphoresis and fever.   HENT: Negative for ear pain, rhinorrhea, sinus pain and sneezing.    Eyes: Negative for pain and redness.   Respiratory: Negative for shortness of breath, wheezing and stridor.    Cardiovascular: Negative for chest pain and leg swelling.   Gastrointestinal: Positive for abdominal pain, nausea and vomiting. Negative for abdominal distention and blood in stool.   Genitourinary: Negative for dysuria and urgency.   Musculoskeletal: Negative for joint swelling.   Skin: Negative for rash and wound.   Neurological: Negative for tremors, syncope and light-headedness.       Objective:   Patient Vitals for the past 8 hrs:   BP Temp Temp src Pulse Resp SpO2   04/26/18 0906 136/67 37.1 ??C Oral 67 18 96 %   04/26/18 0536 161/89 36.7 ??C Oral 64 18 94 %   04/26/18 0443 148/87 36.8 ??C Oral 72 18 96 %     No intake/output data recorded.    Physical Exam  Physical Exam   Constitutional: He is oriented to person, place, and time. He appears well-developed.   HENT:   Head: Normocephalic and atraumatic.   Eyes: Pupils are equal, round, and reactive to light. Conjunctivae and EOM are normal. No scleral icterus.   Neck: Normal range of motion. Neck supple. No JVD present. No thyromegaly present.   Cardiovascular: Normal rate and regular rhythm. Exam reveals no gallop and no friction rub.   No murmur heard.  Abdominal: Soft. Bowel sounds are normal. There is tenderness. There is no rebound.   Musculoskeletal:         General: No edema.   Neurological: He is alert and oriented to person, place, and time. No cranial nerve deficit.   Skin: Skin is warm and dry. He is not diaphoretic.       Test Results  Data Review:    All lab results last 24 hours:    Recent Results (from the past 24 hour(s))   Comprehensive Metabolic Panel    Collection Time: 04/26/18  5:16 AM   Result Value Ref Range    Sodium 141 135 - 145 mmol/L    Potassium 3.8 3.5 - 5.0 mmol/L    Chloride 102 98 - 107 mmol/L    CO2 23.0 22.0 - 30.0 mmol/L    BUN 12 7 - 21 mg/dL    Creatinine 1.61 (H) 0.70 - 1.30 mg/dL    BUN/Creatinine Ratio 8     EGFR CKD-EPI Non-African American, Male 60 >=60 mL/min/1.86m2  EGFR CKD-EPI African American, Male 54 >=60 mL/min/1.82m2    Glucose 150 65 - 179 mg/dL    Calcium 8.8 8.5 - 16.1 mg/dL    Albumin 3.9 3.5 - 5.0 g/dL    Total Protein 7.1 6.5 - 8.3 g/dL    Total Bilirubin 0.6 0.0 - 1.2 mg/dL    AST 37 19 - 55 U/L    ALT 26 <50 U/L    Alkaline Phosphatase 389 (H) 38 - 126 U/L    Anion Gap 16 (H) 7 - 15 mmol/L   Lipase Level    Collection Time: 04/26/18  5:16 AM   Result Value Ref Range    Lipase <10 (L) 44 - 232 U/L   Magnesium Level    Collection Time: 04/26/18  5:16 AM   Result Value Ref Range    Magnesium 1.6 1.6 - 2.2 mg/dL   Phosphorus Level    Collection Time: 04/26/18  5:16 AM   Result Value Ref Range    Phosphorus 3.7 2.9 - 4.7 mg/dL   CBC w/ Differential    Collection Time: 04/26/18  5:16 AM   Result Value Ref Range    WBC 7.1 4.5 - 11.0 10*9/L    RBC 3.73 (L) 4.50 - 5.90 10*12/L    HGB 10.6 (L) 13.5 - 17.5 g/dL    HCT 09.6 (L) 04.5 - 53.0 %    MCV 85.6 80.0 - 100.0 fL    MCH 28.3 26.0 - 34.0 pg    MCHC 33.1 31.0 - 37.0 g/dL    RDW 40.9 81.1 - 91.4 %    MPV 7.3 7.0 - 10.0 fL    Platelet 233 150 - 440 10*9/L    Neutrophils % 69.5 %    Lymphocytes % 21.9 %    Monocytes % 3.3 %    Eosinophils % 3.1 %    Basophils % 0.4 %    Absolute Neutrophils 4.9 2.0 - 7.5 10*9/L    Absolute Lymphocytes 1.5 1.5 - 5.0 10*9/L    Absolute Monocytes 0.2 0.2 - 0.8 10*9/L    Absolute Eosinophils 0.2 0.0 - 0.4 10*9/L    Absolute Basophils 0.0 0.0 - 0.1 10*9/L    Large Unstained Cells 2 0 - 4 %   POCT Glucose    Collection Time: 04/26/18  9:59 AM   Result Value Ref Range    Glucose, POC 130 65 - 179 mg/dL     Imaging: None

## 2018-04-26 NOTE — Unmapped (Signed)
Called report to [phone]

## 2018-04-26 NOTE — Unmapped (Signed)
[facility]   Initial Psychiatry Consult Note     Service Date: April 26, 2018  LOS:  LOS: 0 days      Assessment:   Walter [name] is a 37 y.o. 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  Patient was seen in consultation by Psychiatry at the request of Victory Dakin,* with Pulmonology ([facility]) for evaluation of alcohol use disorder, repeated admissions.     The patient's current presentation is most consistent with alcohol use disorder as well as likely benzodiazepine and opiate use disorders given reports of drug seeking behavior (pt reportedly left [facility] yesterday when benzos were tapered). Historical diagnoses of anxiety and bipolar disorder may well be contributing to presentation. The patient is currently emphasizing that Xanax is the only thing that will help his anxiety and allow him to function well. Given this belief, treatment with other medications may not likely be overly successful. He is not interested in trying psychotherapy, which would likely be the most helpful intervention if he were able to engage in it.     Given the patient's repeated ED visits and admissions along with concerns for benzo and opiate seeking behaviors, would recommend that the patient be referred to/discussed by the Complex Care Committee to consider the development of a care plan for the patient. Otherwise, would recommend continuing current home medications. The patient is seen as an outpatient at [facility] clinic in [place]. PRN Zyprexa could be offered in the event that the patient becomes more anxious or agitated during his stay.    Please see below for detailed recommendations.    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 Risk factors for self-harm/suicide: previous suicide attempt(s), feelings of hopelessness, lack of social support, impulsive tendencies, agitation, unwillingness to seek help, current substance abuse, chronic severe medical condition, chronic mental illness > 5 years, chronic poor judgment and recently lost his job.  Protective factors against self-harm/suicide:  lack of active SI, no know access to weapons or firearms, no previous suicide attempts in the last 6 months, currently receiving mental health treatment, has access to clinical interventions and support and supportive friends.  Risk factors for harm to others: agitation, diminished economic activities, low level of community participation, current substance abuse and lack of insight.  Protective factors against harm to others: no known history of violence towards others, no active symptoms of psychosis and no active symptoms of mania.  While future psychiatric events cannot be accurately predicted, the patient is not currently at elevated imminent risk, and is at elevated chronic risk of harm to self and is not currently at elevated imminent risk, and is at elevated chronic risk of harm to others.       Recommendations:   ## Safety:   --  No acute safey concerns.  Please see safety assessment for further discussion.  -- If pt attempts to leave against medical advice and it is felt to be unsafe for them to leave, please call a Behavioral Response and page Psychiatry at [phone].    ## Medications:   -- Continue current home medications  -- START Zyprexa 2.5mg  bid prn for anxiety or agitation while hospitalized  -- While the patient is receiving medications (such as Zyprexa) that may prolong QTc and increase risk for torsades:      - MONITOR and KEEP Mg>2 and K>4      - MONITOR QTc regularly.  If QTc on tele strip >489ms, obtain 12-lead EKG.     ## Medical Decision Making Capacity:   -- A formal capacity assessement was not performed as a part of this evaluation.  If specific capacity questions arise, please contact our team as below.     ## Further Work-up:   -- Continue to work-up and treat possible medical conditions that may be contributing to current presentation.     ## Disposition:   -- There are no psychiatric contraindications to discharging this patient when medically appropriate.  -- The patient will follow-up with [facility] for mental health care at the time of discharge.    ## Behavioral / Environmental:   -- Complex Care Committee to develop care plan would be helpful given repeated admissions and ED presentations   -- Although not currently delirious, the patient is at an elevated risk for developing delirium given multiple medical conditions, infection, pain, and treatment with multiple medications. Implement delirium precautions:  -- Please order Delirium (prevention) protocol: the following can be copied into a single misc nursing order.        - RN to open blinds every morning        - To bedside: glasses, hearing aide, patient's own shoes. Make available to patient's when possible and encourage use        - RN to assess orientation (person, place, & time) qam and prn, with frequent reorientation (verbal & whiteboard) & introduction of caregivers. ??         - Recommend extended visiting hours with familiar family/friends as feasible        - Encourage normal sleep-wake cycle by promoting a dark, quiet environment at night and stimulating, light environment during the day. ??        - Turn the TV off when patient is asleep or not in use.    Thank you for this consult request. Recommendations have been communicated to the primary team.  We will follow as needed at this time. Please page [phone] or [phone] (after hours) for any questions or concerns.     Discussed with and seen by Attending, Breck Coons, MD, who agrees with the assessment and plan.    Nonnie Done, MD      I saw and evaluated the patient, participating in the key portions of the service.  I reviewed the resident???s note.  I agree with the resident???s findings and plan.     Cloria Spring, MD        History:   Relevant Aspects of Hospital Course:   Patient is being admitted  On 04/26/2018 due to N/V and inability to tolerate PO.     Patient was last seen by [facility] on 04/06/18 during admission for pulmonary exacerbation and hyperglycemic hyperosmotic state. At that time, he was thought to have an  unspecified anxiety disorder.     He was discharged from [facility] on 11/6 and then reportedly began several days of binge drinking, immediately followed by severe abdominal pain. He then presented to the [facility] ED twice but left AMA. Most recently, per primary team, patient was discharged AMA from [facility] at 9:45PM last night after Ativan was tapered. He presented to [facility] ED with N/V and epigastric pain. The patient reportedly said this is how he usually feels after he drinks. Last drink reported to be 11/10.     He has previously been given information on [facility]. He is seen by Robert Bellow, NP at [facility] for psychiatric care.     Patient Report:   The patient states that he is here because he drank three bottles of wine and needing to come to the hospital due to N/V and abdominal pain was a somewhat expected result. He reports that his mood is terrible.    The patient states that there was no triggering event for his drinking but then states that he did lose his job a few weeks ago. He denies that he is feeling depressed. He denies SI. He does endorse anxiety. He denies ever having experienced AVH. The patient reports that his roommate is an old friend and a good support. He reports that his mother lives nearby but they are not close.    When asked if he thought his drinking was a problem, he said What the f--k kind of question is that? and indicated that he was aware that drinking was a problem due to now being in the hospital. However, he was not interested in hearing about options for alcohol abuse treatment. He states that he has already done treatment and had long periods of sobriety.    He became more irritable and agitated saying that Xanax was the only thing that had ever worked for his anxiety, and he feels that following his OD several years ago he has been punished by providers who have not wanted to prescribe it. He denied that any of his current medications help his anxiety. He said you all nod like you understand but you don't do anything about it. He then said the interview was over.    ROS:   Unable to complete a full review of systems given patient's abrupt end to interview.    Collateral information:   Reviewed the following sources, including relevant findings.  - [place] Controlled Substance Reporting System: Librium prescriptions 06/2016, 03/2017 and 07/2017. Percocet, oxycodone, tramadol and morphine prescriptions from 09/2016-06/2017.   - Review of medical records in Epic.    Psychiatric History:   Per chart review and patient interview:  Prior psychiatric diagnoses:??Bipolar disorder (though he does not believe this is accurate), depression, anxiety,??alcohol abuse (sober from??Feb 2019-July 2019),??opioid use disorder, marijuana use disorder,??reported??benzodiazepine use disorder   Psychiatric hospitalizations:??5 prior psych hospitalizations:??[facility] 04/2016 for SI, mania,??[facility] and??then to??[facility] 2015 for suicide attempt by OD on??Xanax, [place] [facility] for depression  Substance abuse treatment:??Goes to [facility];??He has been to inpatient substance abuse treatment in 2016 in [place] for 6 months.??  Suicide attempts / Non-suicidal self-injury:??He has been admitted for overdose in 09/2016 (alcohol and tramadol) and 07/2013 (opiates and benzodiazepines??(Xanax)).??  Medication trials/compliance:??Subutex, Seroquel??(started to give SE--feeling disoriented), Gabapentin, Prozac, Zoloft??(too numb), Lithium (was horrible),??Lexapro??(too numb in 2006), trazodone (made me feel funny),??Celexa (the worst), Xanax (worked), buspar, mirtazapine, hydroxyzine (doesn't work), trazodone  Current psychiatrist:??Dr. Thurston Hole Ruminjo (Currently seeing NP Smolko at [facility] clinic while she is  on leave)  Current therapist:??Pt denies, he states he tried in past and not helpful  Endorses family history of??mother with bipolar disorder and paternal uncle with alcohol use disorder  ??    Medical History:  Past Medical History:   Diagnosis Date   ??? Alcohol abuse    ??? 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 Bipolar disorder (CMS-HCC)    ??? Chronic pancreatitis (CMS-HCC)    ??? Chronic sinusitis    ??? 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 Cystic fibrosis (CMS-HCC)    ??? Diabetes mellitus (CMS-HCC)     dx in-house 2015   ??? Disease of thyroid gland    ??? Hypertension    ??? Kidney stones    ??? Portal hypertension (CMS-HCC)        Surgical History:  Past Surgical History:   Procedure Laterality Date   ??? CHOLECYSTECTOMY  2008   ??? sinus surgery     ??? TONSILLECTOMY         Medications:     Current Facility-Administered Medications:   ???  acetaminophen (TYLENOL) tablet 1,000 mg, 1,000 mg, Oral, Q8H, Phillis Haggis, MD, 1,000 mg at 04/26/18 1014  ???  albuterol 2.5 mg /3 mL (0.083 %) nebulizer solution 2.5 mg, 2.5 mg, Nebulization, BID (RT), Phillis Haggis, MD, 2.5 mg at 04/26/18 1014  ???  busPIRone (BUSPAR) tablet 15 mg, 15 mg, Oral, TID, Phillis Haggis, MD  ???  dornase alfa (PULMOZYME) 1 mg/mL nebulizer solution 2.5 mg, 2.5 mg, Inhalation, Daily (RT), Phillis Haggis, MD  ???  enoxaparin (LOVENOX) syringe 40 mg, 40 mg, Subcutaneous, Q24H, Phillis Haggis, MD, 40 mg at 04/26/18 1013  ???  fludrocortisone (FLORINEF) tablet 0.1 mg, 0.1 mg, Oral, Daily, Phillis Haggis, MD, Stopped at 04/26/18 1015  ???  gabapentin (NEURONTIN) capsule 1,200 mg, 1,200 mg, Oral, Daily, Phillis Haggis, MD, Stopped at 04/26/18 1015  ???  gabapentin (NEURONTIN) capsule 900 mg, 900 mg, Oral, BID, Phillis Haggis, MD  ???  insulin glargine (LANTUS) injection 28 Units, 28 Units, Subcutaneous, Nightly, Phillis Haggis, MD  ???  insulin lispro (HumaLOG) injection 1-20 Units, 1-20 Units, Subcutaneous, ACHS, Phillis Haggis, MD, Stopped at 04/26/18 1030  ???  insulin lispro (HumaLOG) injection 12 Units, 12 Units, Subcutaneous, 5XD insulin, Phillis Haggis, MD, Stopped at 04/26/18 1219  ???  lidocaine (LIDODERM) 5 % patch 1 patch, 1 patch, Transdermal, Q24H, Phillis Haggis, MD, 1 patch at 04/26/18 1036  ???  lipase-protease-amylase (ZENPEP) 20,000-63,000- 84,000 unit capsule, delayed release 240,000 units of lipase, 12 capsule, Oral, 3xd Meals, Phillis Haggis, MD  ???  magnesium oxide (MAG-OX) tablet 800 mg, 800 mg, Oral, Daily, Phillis Haggis, MD, Stopped at 04/26/18 1015  ???  melatonin tablet 6 mg, 6 mg, Oral, QPM, Phillis Haggis, MD  ???  mirtazapine (REMERON) tablet 45 mg, 45 mg, Oral, Nightly, Phillis Haggis, MD  ???  MORPhine injection 1 mg, 1 mg, Intravenous, Q6H PRN, Phillis Haggis, MD, 1 mg at 04/26/18 1014  ???  [START ON 04/27/2018] MVW Complete (pediatric multivit 61-D3-vit K) 1,500-800 unit-mcg 2 capsule, 2 capsule, Oral, Daily, Phillis Haggis, MD  ???  OLANZapine (ZYPREXA) tablet 15 mg, 15 mg, Oral, Nightly, Phillis Haggis, MD  ???  oxyCODONE (ROXICODONE) immediate release tablet 5 mg, 5 mg, Oral, Q4H PRN **OR** oxyCODONE (ROXICODONE) immediate release tablet 10 mg, 10 mg, Oral, Q4H PRN, Viviann Spare  Martina Sinner, MD  ???  pantoprazole (PROTONIX) EC tablet 40 mg, 40 mg, Oral, BID, Phillis Haggis, MD, Stopped at 04/26/18 1016  ???  polyethylene glycol (MIRALAX) packet 17 g, 17 g, Oral, BID, Phillis Haggis, MD  ???  promethazine (PHENERGAN) tablet 12.5 mg, 12.5 mg, Oral, Q6H PRN, 12.5 mg at 04/26/18 1215 **OR** promethazine (PHENERGAN) 6.25 mg in sodium chloride (NS) 0.9 % 25 mL infusion, 6.25 mg, Intravenous, Q6H PRN **OR** promethazine (PHENERGAN) 12.5 mg in sodium chloride (NS) 0.9 % 25 mL infusion, 12.5 mg, Intravenous, Q6H PRN, 12.5 mg at 04/26/18 1843 **OR** promethazine (PHENERGAN) suppository 12.5 mg, 12.5 mg, Rectal, Q6H PRN, Phillis Haggis, MD  ???  senna (SENOKOT) tablet 2 tablet, 2 tablet, Oral, BID, Phillis Haggis, MD  ???  sodium chloride (NS) 0.9 % infusion, 100 mL/hr, Intravenous, Continuous, Phillis Haggis, MD, Last Rate: 100 mL/hr at 04/26/18 1016, 100 mL/hr at 04/26/18 1016  ???  sodium chloride 7% nebulizer solution 4 mL, 4 mL, Nebulization, BID, Phillis Haggis, MD  ???  traZODone (DESYREL) tablet 50 mg, 50 mg, Oral, Nightly, Phillis Haggis, MD    Allergies:  No Known Allergies    Social History:   Living situation: the patient with a roommate.  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  Drug use: did not discuss    Objective:   Vital signs:   Temp:  [36.7 ??C-37.2 ??C] 36.9 ??C  Heart Rate:  [64-72] 67  Resp:  [18] 18  BP: (136-175)/(67-103) 175/103  MAP (mmHg):  [133] 133  SpO2:  [94 %-98 %] 95 %    Physical Exam:  Gen: appears uncomfortable but in NAD  Pulm: Normal work of breathing.  Neuro/MSK: Very thin. Gait/station deferred given medical illness.  Skin: normal skin tone.    Mental Status Exam:  Appearance:  appears stated age, unkept, dissheveled, lying in bed and with bucket of vomit in front of him, visibly dirty clothing   Attitude:   hostile   Behavior/Psychomotor:  appropriate eye contact and no abnormal movements   Speech/Language:   normal rate, volume, tone, fluency   Mood:  ???terrible??? Affect:  angry, dysthymic and irritable    Thought process:  linear, clear, coherent, goal directed   Thought content:    denies thoughts of self-harm. Denies SI, plans, or intent. Denies HI.  No grandiose, self-referential, persecutory, or paranoid delusions noted.   Perceptual disturbances:   denies auditory and visual hallucinations   Attention:  able to fully attend without fluctuations in consciousness   Concentration:  Able to fully concentrate and attend   Orientation:  grossly oriented.   Memory:  not formally tested, but grossly intact   Fund of knowledge:   not formally assessed   Insight:    Impaired   Judgment:   Impaired   Impulse Control:  Impaired       Data Reviewed:  I reviewed labs from the last 24 hours.     Additional Psychometric Testing:  CSSRS No Risk

## 2018-04-26 NOTE — Unmapped (Signed)
Received sign out from Dr. Claretha Cooper    ED I-PASS Handoff  ?? Illness Severit: stable  ?? Patient Summary: Walter [name] is a 37 y.o. male with a past medical history of cystic fibrosis, insulin dep diabetes, hep C, alcohol abuse, cirrhosis, pancreatitis, bipolar disorder presenting with epigastric abdominal pain nausea vomiting.  He was evaluated twice over at [facility] for the same constellation of symptoms 3 days ago on Saturday the 9th and was admitted.  Yesterday morning he left AMA, then later on in the day he presented back to [facility] for same complaints and was discharged, then came here. reports his last drink was 2 days ago, not currently withdrawing.  He has had 2 doses of Dilaudid overnight as well as Zofran and Reglan.  Pain does not seem to be well-controlled, patient is still actively vomiting. Of note, he had an admission at [facility] recently for CF exacerbation and was discharged on Nov 6.   ?? Action List: Reassess patient, despite pending.  ?? Situation Awareness (Contingency Planning): Anticipate admission.  ?? Synthesis by Receiver    7:30 AM  On reassessment patient appears uncomfortable, complaining of 6 out of 10 epigastric abdominal pain mildly tender to palpation, complaining of ongoing nausea and vomiting.  Yellow emesis noted in basin.  Plan for admission.    7:40 AM  MAO paged.     8:20 AM  Spoke with med G resident, will come to evaluate for admission.

## 2018-04-27 LAB — RED BLOOD CELL COUNT: Lab: 3.21 — ABNORMAL LOW

## 2018-04-27 LAB — CBC
HEMOGLOBIN: 9.2 g/dL — ABNORMAL LOW (ref 13.5–17.5)
MEAN CORPUSCULAR HEMOGLOBIN CONC: 33.8 g/dL (ref 31.0–37.0)
MEAN CORPUSCULAR HEMOGLOBIN: 28.6 pg (ref 26.0–34.0)
MEAN CORPUSCULAR VOLUME: 84.6 fL (ref 80.0–100.0)
MEAN PLATELET VOLUME: 7 fL (ref 7.0–10.0)
PLATELET COUNT: 241 10*9/L (ref 150–440)
RED BLOOD CELL COUNT: 3.21 10*12/L — ABNORMAL LOW (ref 4.50–5.90)
WBC ADJUSTED: 6.4 10*9/L (ref 4.5–11.0)

## 2018-04-27 LAB — BASIC METABOLIC PANEL
ANION GAP: 7 mmol/L (ref 7–15)
BLOOD UREA NITROGEN: 9 mg/dL (ref 7–21)
BUN / CREAT RATIO: 7
CALCIUM: 8.2 mg/dL — ABNORMAL LOW (ref 8.5–10.2)
CHLORIDE: 104 mmol/L (ref 98–107)
CO2: 27 mmol/L (ref 22.0–30.0)
EGFR CKD-EPI AA MALE: 83 mL/min/{1.73_m2} (ref >=60)
EGFR CKD-EPI NON-AA MALE: 72 mL/min/{1.73_m2} (ref >=60)
POTASSIUM: 3.1 mmol/L — ABNORMAL LOW (ref 3.5–5.0)
SODIUM: 138 mmol/L (ref 135–145)

## 2018-04-27 LAB — MAGNESIUM: Magnesium:MCnc:Pt:Ser/Plas:Qn:: 1.5 — ABNORMAL LOW

## 2018-04-27 LAB — PHOSPHORUS: Phosphate:MCnc:Pt:Ser/Plas:Qn:: 3.4

## 2018-04-27 LAB — SODIUM: Sodium:SCnc:Pt:Ser/Plas:Qn:: 138

## 2018-04-27 MED ORDER — SENNOSIDES 8.6 MG TABLET
ORAL_TABLET | Freq: Two times a day (BID) | ORAL | 0 refills | 0.00000 days | Status: CP
Start: 2018-04-27 — End: 2018-05-09

## 2018-04-27 MED ORDER — POLYETHYLENE GLYCOL 3350 17 GRAM ORAL POWDER PACKET
PACK | Freq: Two times a day (BID) | ORAL | 0 refills | 0 days | Status: CP
Start: 2018-04-27 — End: 2018-05-09

## 2018-04-27 NOTE — Unmapped (Signed)
Cystic Fibrosis Nutrition Assessment    Inpatient: MD Consult this admission and related follow up   Primary Pulmonary Provider: Dr. Lurena Nida  ==================================================================  Walter [name] is a 37 y.o. male consulted for nutrition assessment. Admitted with intractable nausea, vomiting, recent episode of binge drinking PTA.   - KUB 04-26-18 showed obstructed bowel gas pattern with moderate/large stool burden.  ===================================================================  INTERVENTION:  RD unable to visit with patient today as he left unit and was DC'd by MD AMA.

## 2018-04-27 NOTE — Unmapped (Signed)
Pt. Is willing to do his morning tx. But not have his morning vitals done.

## 2018-04-27 NOTE — Unmapped (Signed)
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   -RUQ ultrasound today  ??  Ethanol Abuse:  -Episodes of binge drinking  -Has brief peroids of sobriety (lasting a few months max)  -Has attended Alcoholics Anonymous, but has not received psych evaluation  -Consult psychiatry, recs appreciated   ??  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 Lurena Nida.  -FEV1 of 29.3% (04/11/2018).  - Prior cultures??with mucoid and smooth PsA, H flu, achromobacter.   -Continue airway clearance per home regimen   ??  Diabetes Mellitus 2/2 Cystic Fibrosis:  -Previously seen by endocrine inpatient  -blood sugar 130 on admission.   -Fingersticks ACHS  -ISS, 28U lantus QHS, lispro 12 units with meals  -Consistent Carb diet   -Endocrine to see today  ??  Chronic Kidney Disease:  -Basline Cr ~1.5  -Hyperkalemic on previous admissions, continue flurinef  ??  Cirrhosis:  -CF, EtOHm and hepatitis contributory.  -LFTs wnl    ___________________________________________________________________ Labs/Studies:  Labs and Studies from the last 24hrs per EMR and Reviewed    Objective:  Temp:  [36.7 ??C-37.3 ??C] 36.7 ??C  Heart Rate:  [59-71] 71  Resp:  [18] 18  BP: (136-196)/(67-103) 160/77  SpO2:  [95 %-98 %] 97 %    Gen: WDWN male in NAD, alert, oriented, answers questions appropriately  HEENT: atraumatic, sclera anicteric, MMM. OP w/o erythema or exudate   Heart: RRR, S1, S2, no M/R/G, no chest wall tenderness  Lungs: CTAB, no wheezes, rhonci, or rales, no use of accessory muscles  Abdomen: Normoactive bowel sounds, soft, tenderness in epigastrium and right side without rebound/guarding  Extremities: no clubbing, cyanosis, or edema  Neuro: No focal deficits.  Skin:  No rashes, lesions    Medications:  Scheduled Meds:  ??? dextrose       ??? acetaminophen  1,000 mg Oral Q8H   ??? albuterol  2.5 mg Nebulization BID (RT)   ??? busPIRone  15 mg Oral TID   ??? dextrose  12.5 g Intravenous Once   ??? dornase alfa  2.5 mg Inhalation Daily (RT)   ??? enoxaparin (LOVENOX) injection  40 mg Subcutaneous Q24H   ??? fludrocortisone  0.1 mg Oral Daily   ??? gabapentin  1,200 mg Oral Daily   ??? gabapentin  900 mg Oral BID   ??? insulin glargine  28 Units Subcutaneous Nightly   ??? insulin lispro  1-20 Units Subcutaneous ACHS   ??? insulin lispro  12 Units Subcutaneous 5XD insulin   ??? lidocaine  1 patch Transdermal Q24H   ??? lipase-protease-amylase  12 capsule Oral 3xd Meals   ??? magnesium oxide  800 mg Oral Daily   ??? magnesium sulfate  2 g Intravenous Once   ??? melatonin  6 mg Oral QPM   ??? mirtazapine  45 mg Oral Nightly   ??? MVW Complete (pediatric multivit 61-D3-vit K)  2 capsule Oral Daily   ??? OLANZapine  15 mg Oral Nightly   ??? pantoprazole  40 mg Oral BID   ??? polyethylene glycol  17 g Oral BID   ??? potassium & sodium phosphates 250mg   2 packet Oral Once   ??? potassium chloride  20 mEq Oral Once   ??? senna  2 tablet Oral BID   ??? sodium chloride 7%  4 mL Nebulization BID   ??? traZODone  50 mg Oral Nightly     Continuous Infusions:  ??? sodium chloride Stopped (04/27/18 0220)     PRN Meds:.oxyCODONE **OR** oxyCODONE, promethazine **OR** promethazine **OR** promethazine **OR** promethazine

## 2018-04-27 NOTE — Unmapped (Signed)
??    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    Abdominal Pain with Emesis: Patient has history of chronic pancreatitis and alcohol abuse, reports recent episode of binge drinking prior to admission says he does not drink in 3 days but has had persistent abdominal pain.  He reports nausea and vomiting.  Was having regular bowel movements, most recent one few hours prior to admission.  Lipase was negative, ALT and AST were within normal limits, alk phos was elevated but chronically is elevated.  Patient was given IV and p.o. Phenergan for nausea control.  Was given Tylenol scheduled for pain control with as needed oxycodone.  KUB was performed which showed substantial stool burden patient was started on MiraLAX and senna.  Prior to discharge plan was to ultrasound patient's right upper quadrant, however patient left before this was preformed.    Ethanol Abuse: Patient has a history of binge drinking, has had brief periods of sobriety in the past.  He has attended Alcoholics Anonymous meetings but has not received psychiatric evaluation.  While inpatient psychiatry evaluated patient and recommended continuing pain regimen of gabapentin as well as outpatient follow-up.  There was no evidence of acute withdrawal.    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  Patient did have episodes of hypoglycemia on 11/13 a.m. given D10 2x.  Endocrinology was consulted, patient left before being evaluated by them.    Cystic Fibrosis with Bronchiectases: Patient had no pulmonary complaints on presentation. ?F-508/?I-507, primary pulmonologist is??Dr. Lurena Nida.  Home airway clearance regimen was continued.    Chronic Kidney Disease: Patient's baseline creatinine is approximately 1.5.  Was downtrending throughout hospitalization.    At 11:47 on 11/13 physician was notified that patient had left the floor approximately 30 minutes earlier had taken on his own IV and upon further investigation all of his siblings were gone.  Patient had eloped.  Medications were sent to patient's pharmacy and attempt was made to contact patient.

## 2018-04-27 NOTE — Unmapped (Addendum)
Meds given per MAR. VSS, although BP initially elevated this night. Pt did vomit a few times at the beginning of the shift. Pt c/o abdominal pain. PRN Morphine given x1 with minimal relief. Pt was very upset about only receiving ordered 1mg . Explained that was the dosage ordered. Pt requesting I page the doctor and ask for an increased dose. MDG notified. No changes made to pain regimen. Morphine discontinued this AM, so pulled dose wasn't administered. Pt asked nurse to administer it anyway. Explained that could not be done since the order was no longer active. Pt requesting to talk with team about pain management. MDG paged. POC continues     Problem: Adult Inpatient Plan of Care  Goal: Plan of Care Review  Outcome: Not Progressing  Flowsheets  Taken 04/27/2018 0650 by Julieta Gutting, RN  Progress: improving  Taken 04/26/2018 1926 by Enzo Bi, RN  Plan of Care Reviewed With: patient  Goal: Patient-Specific Goal (Individualization)  Outcome: Not Progressing  Flowsheets  Taken 04/26/2018 1926 by Enzo Bi, RN  Individualized Care Needs: prefers to be called Walter [name]  Taken 04/27/2018 0650 by Julieta Gutting, RN  Anxieties, Fears or Concerns: concerned about pain meds  Goal: Absence of Hospital-Acquired Illness or Injury  Outcome: Not Progressing  Intervention: Identify and Manage Fall Risk  Flowsheets (Taken 04/26/2018 2100 by Leander Rams, CNA)  Safety Interventions: lighting adjusted for tasks/safety;low bed;nonskid shoes/slippers when out of bed  Intervention: Prevent Skin Injury  Flowsheets (Taken 04/26/2018 2100 by Leander Rams, CNA)  Pressure Reduction Techniques: frequent weight shift encouraged  Intervention: Prevent VTE (venous thromboembolism)  Flowsheets (Taken 04/27/2018 0650)  VTE Prevention/Management: ambulation promoted; fluids promoted; intravenous hydration  Intervention: Prevent Infection  Flowsheets (Taken 04/27/2018 0650)  Infection Prevention: handwashing promoted; personal protective equipment utilized; rest/sleep promoted; single patient room provided  Goal: Optimal Comfort and Wellbeing  Outcome: Not Progressing  Intervention: Monitor Pain and Promote Comfort  Flowsheets (Taken 04/27/2018 0650)  Pain Management Interventions: pain management plan reviewed with patient/caregiver; care clustered; quiet environment facilitated  Intervention: Provide Person-Centered Care  Flowsheets (Taken 04/27/2018 0650)  Trust Relationship/Rapport: care explained; choices provided; questions encouraged; questions answered  Goal: Readiness for Transition of Care  Outcome: Not Progressing  Goal: Rounds/Family Conference  Outcome: Not Progressing     Problem: Infection  Goal: Infection Symptom Resolution  Outcome: Not Progressing  Intervention: Prevent or Manage Infection  Flowsheets  Taken 04/26/2018 2100 by Leander Rams, CNA  Infection Management: aseptic technique maintained  Isolation Precautions: contact precautions maintained  Taken 04/27/2018 0650 by Julieta Gutting, RN  Fever Reduction/Comfort Measures: lightweight bedding;lightweight clothing

## 2018-04-27 NOTE — Unmapped (Signed)
Physician Discharge Summary    Admit date: 04/26/2018    Discharge date and time: 04/27/2018; 12:40pm    Discharge to: Patient Eloped    Discharge Service: Pulmonology (MDG)    Discharge Attending Physician: Victory Dakin,*    Discharge Diagnoses: Abdominal Pain        Hospital Course:  ??  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    Abdominal Pain with Emesis: Patient has history of chronic pancreatitis and alcohol abuse, reports recent episode of binge drinking prior to admission says he does not drink in 3 days but has had persistent abdominal pain.  He reports nausea and vomiting.  Was having regular bowel movements, most recent one few hours prior to admission.  Lipase was negative, ALT and AST were within normal limits, alk phos was elevated but chronically is elevated.  Patient was given IV and p.o. Phenergan for nausea control.  Was given Tylenol scheduled for pain control with as needed oxycodone.  KUB was performed which showed substantial stool burden patient was started on MiraLAX and senna.  Prior to discharge plan was to ultrasound patient's right upper quadrant, however patient left before this was preformed.    Ethanol Abuse: Patient has a history of binge drinking, has had brief periods of sobriety in the past.  He has attended Alcoholics Anonymous meetings but has not received psychiatric evaluation.  While inpatient psychiatry evaluated patient and recommended continuing pain regimen of gabapentin as well as outpatient follow-up.  There was no evidence of acute withdrawal.    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  Patient did have episodes of hypoglycemia on 11/13 a.m. given D10 2x.  Endocrinology was consulted, patient left before being evaluated by them.    Cystic Fibrosis with Bronchiectases: Patient had no pulmonary complaints on presentation. ?F-508/?I-507, primary pulmonologist is??Dr. Lurena Nida.  Home airway clearance regimen was continued.    Chronic Kidney Disease: Patient's baseline creatinine is approximately 1.5.  Was downtrending throughout hospitalization.    At 11:47 on 11/13 physician was notified that patient had left the floor approximately 30 minutes earlier had taken on his own IV and upon further investigation all of his siblings were gone.  Patient had eloped.  Medications were sent to patient's pharmacy and attempt was made to contact patient.     Condition at Discharge: unknown.  Discharge Medications:      Your Medication List      START taking these medications    polyethylene glycol 17 gram packet  Commonly known as:  MIRALAX  Take 17 g by mouth Two (2) times a day.     senna 8.6 mg tablet  Commonly known as:  SENOKOT  Take 2 tablets by mouth Two (2) times a day.        CONTINUE taking these medications    albuterol 2.5 mg /3 mL (0.083 %) nebulizer solution  Inhale 2.5 mg by nebulization 2 (two) times a day.     busPIRone 15 MG tablet  Commonly known as:  BUSPAR  Take 1 tablet (15 mg total) by mouth Three (3) times a day.     fludrocortisone 0.1 mg tablet  Commonly known as:  FLORINEF  Take 1 tablet (0.1 mg total) by mouth daily.  gabapentin 300 MG capsule  Commonly known as:  NEURONTIN  Take 4 capsules (1200mg ) in the morning and 3 capsule (900mg ) at 12p and bedtime.     ibuprofen 200 MG tablet  Commonly known as:  ADVIL,MOTRIN  Take 800 mg by mouth 4 (four) times a day as needed.     insulin glargine 100 unit/mL injection  Commonly known as:  LANTUS  Inject 24 Units under the skin nightly.     LC PLUS Misc  Generic drug:  nebulizers  Pari LC Plus Reusable Cup     lidocaine 5 % patch  Commonly known as:  LIDODERM  Place 1 patch on the skin daily. Apply to affected area for 12 hours only each day (then remove patch)     magnesium oxide 400 mg (241.3 mg magnesium) tablet  Commonly known as:  MAG-OX  Take 2 tablets (800 mg total) by mouth daily.     melatonin 3 mg Tab  Take 2 tablets (6 mg total) by mouth every evening.     mirtazapine 45 MG tablet  Commonly known as:  REMERON  Take 1 tablet (45 mg total) by mouth nightly.     MVW Complete (pediatric multivit 61-D3-vit K) 1,500-800 unit-mcg Cap  Take 2 capsules by mouth daily.     OLANZapine 15 MG tablet  Commonly known as:  ZyPREXA  Take 1 tablet (15 mg total) by mouth nightly.     pantoprazole 40 MG tablet  Commonly known as:  PROTONIX  Take 1 tablet (40 mg total) by mouth Two (2) times a day.     promethazine 25 MG tablet  Commonly known as:  PHENERGAN  Take 25 mg by mouth every six (6) hours as needed for nausea.     PULMOZYME 1 mg/mL nebulizer solution  Generic drug:  dornase alfa  Inhale the contents of 1 vial (2.5 mg) via nebulizer once daily.     sodium chloride 7% 7 % Nebu  Inhale 4 mL by nebulization two (2) times a day.     traZODone 50 MG tablet  Commonly known as:  DESYREL  Take 1 tablet (50 mg total) by mouth nightly.     ZENPEP 20,000-63,000- 84,000 unit Cpdr capsule, delayed release  Generic drug:  lipase-protease-amylase  Take 12 capsules by mouth Three (3) times a day with a meal. 12 caps with meals and 3 caps with snacks              Appointments which have been scheduled for you    May 05, 2018 10:15 AM EST  (Arrive by 10:00 AM)  RETURN PFT 15 with [place] PFT 1  [facility] PULMONARY SPECIALTY FUNCT [place] [facility]) [address]  [address]  [place] [address]  [phone]      May 05, 2018 10:30 AM EST  (Arrive by 10:15 AM)  RETURN CYSTIC FIBROSIS with Verlin Fester, PA  [facility] PULMONARY SPECIALTY CL [place] [facility]) [address]  [address]  [place] [address] [phone]      May 09, 2018 12:30 PM EST  (Arrive by 12:15 PM)  RETURN  STEP with Rosezetta Schlatter, PMHNP  [facility] ST[facility] [place] [facility]) [address] [place] [address]  [phone]              I spent less than 30 minutes in the discharge of this patient.

## 2018-04-27 NOTE — Unmapped (Signed)
Alert and oriented x 4, independent with adls, gait steady, walked from stretcher to bed and to bathroom, pt has white basin and was belching as soon as he was settled on his bed, pt wanted to know schedule of his pain meds and his phenergan, reassured and offered emotional reassurance, offered a clean gown but he refused to take off his dirty street clothes, will cont plan of care.  Problem: Adult Inpatient Plan of Care  Goal: Plan of Care Review  04/26/2018 1926 by Enzo Bi, RN  Outcome: Ongoing - Unchanged  Flowsheets (Taken 04/26/2018 1926)  Plan of Care Reviewed With: patient  04/26/2018 1846 by Enzo Bi, RN  Outcome: Ongoing - Unchanged  Goal: Patient-Specific Goal (Individualization)  04/26/2018 1926 by Enzo Bi, RN  Outcome: Ongoing - Unchanged  Flowsheets (Taken 04/26/2018 1926)  Patient-Specific Goals (Include Timeframe): will verbalize decrease in pain an hr after intervention  Individualized Care Needs: prefers to be called Walter [name]  Anxieties, Fears or Concerns: concerned about getting iv meds and being on CIWA  04/26/2018 1846 by Enzo Bi, RN  Outcome: Ongoing - Unchanged  Goal: Absence of Hospital-Acquired Illness or Injury  04/26/2018 1926 by Enzo Bi, RN  Outcome: Ongoing - Unchanged  04/26/2018 1846 by Enzo Bi, RN  Outcome: Ongoing - Unchanged  Goal: Optimal Comfort and Wellbeing  04/26/2018 1926 by Enzo Bi, RN  Outcome: Ongoing - Unchanged  04/26/2018 1846 by Enzo Bi, RN  Outcome: Ongoing - Unchanged  Goal: Readiness for Transition of Care  04/26/2018 1926 by Enzo Bi, RN  Outcome: Ongoing - Unchanged  04/26/2018 1846 by Enzo Bi, RN  Outcome: Ongoing - Unchanged  Goal: Rounds/Family Conference  04/26/2018 1926 by Enzo Bi, RN  Outcome: Ongoing - Unchanged  04/26/2018 1846 by Enzo Bi, RN  Outcome: Ongoing - Unchanged     Problem: Infection  Goal: Infection Symptom Resolution  04/26/2018 1926 by Enzo Bi, RN  Outcome: Ongoing - Unchanged  04/26/2018 1846 by Enzo Bi, RN  Outcome: Ongoing - Unchanged

## 2018-04-28 LAB — COMPREHENSIVE METABOLIC PANEL
ALBUMIN: 3.1 g/dL — ABNORMAL LOW (ref 3.4–5.0)
ALKALINE PHOSPHATASE: 380 U/L — ABNORMAL HIGH (ref 46–116)
ALT (SGPT): 25 U/L (ref 9–40)
AST (SGOT): 32 U/L (ref 13–40)
BILIRUBIN TOTAL: 0.4 mg/dL (ref 0.3–1.2)
BLOOD UREA NITROGEN: 5 mg/dL — ABNORMAL LOW (ref 9–23)
BUN / CREAT RATIO: 4
CALCIUM: 7.9 mg/dL — ABNORMAL LOW (ref 8.7–10.4)
CHLORIDE: 106 mmol/L (ref 98–107)
CO2: 27 mmol/L (ref 20.0–31.0)
CREATININE: 1.26 mg/dL — ABNORMAL HIGH (ref 0.60–1.10)
EGFR CKD-EPI AA MALE: 85 mL/min/{1.73_m2}
EGFR CKD-EPI NON-AA MALE: 73 mL/min/{1.73_m2}
GLUCOSE RANDOM: 123 mg/dL — ABNORMAL HIGH (ref 70–99)
POTASSIUM: 3.1 mmol/L — ABNORMAL LOW (ref 3.5–5.1)
PROTEIN TOTAL: 6.1 g/dL (ref 5.7–8.2)
SODIUM: 143 mmol/L (ref 136–145)

## 2018-04-28 LAB — CBC W/ AUTO DIFF
BASOPHILS ABSOLUTE COUNT: 0.1 10*9/L (ref 0.0–0.1)
BASOPHILS RELATIVE PERCENT: 0.7 %
EOSINOPHILS ABSOLUTE COUNT: 0.3 10*9/L (ref 0.0–0.7)
EOSINOPHILS RELATIVE PERCENT: 4.1 %
HEMOGLOBIN: 10.3 g/dL — ABNORMAL LOW (ref 13.5–17.5)
LYMPHOCYTES ABSOLUTE COUNT: 1.5 10*9/L (ref 0.7–4.0)
LYMPHOCYTES RELATIVE PERCENT: 19.8 %
MEAN CORPUSCULAR HEMOGLOBIN CONC: 34.9 g/dL (ref 30.0–36.0)
MEAN CORPUSCULAR HEMOGLOBIN: 29.1 pg (ref 26.0–34.0)
MEAN CORPUSCULAR VOLUME: 83.3 fL (ref 81.0–95.0)
MEAN PLATELET VOLUME: 7.3 fL (ref 7.0–10.0)
MONOCYTES ABSOLUTE COUNT: 0.3 10*9/L (ref 0.1–1.0)
NEUTROPHILS ABSOLUTE COUNT: 5.2 10*9/L (ref 1.7–7.7)
NEUTROPHILS RELATIVE PERCENT: 70.9 %
RED BLOOD CELL COUNT: 3.53 10*12/L — ABNORMAL LOW (ref 4.32–5.72)
RED CELL DISTRIBUTION WIDTH: 13.3 % (ref 12.0–15.0)

## 2018-04-28 LAB — AST (SGOT): Chemistry studies:Cmplx:-:^Patient:Set:: 32

## 2018-04-28 LAB — EOSINOPHILS ABSOLUTE COUNT: Lab: 0.3

## 2018-04-28 LAB — LIPASE: Chemistry studies:Cmplx:-:^Patient:Set:: 15

## 2018-04-29 ENCOUNTER — Encounter: Admit: 2018-04-29 | Discharge: 2018-04-29 | Discharge status: Against Medical Advice | Payer: PRIVATE HEALTH INSURANCE

## 2018-04-29 MED ORDER — FLUDROCORTISONE 0.1 MG TABLET
ORAL_TABLET | Freq: Every day | ORAL | 1 refills | 0.00000 days | Status: CP
Start: 2018-04-29 — End: 2018-05-09

## 2018-04-29 NOTE — Unmapped (Signed)
Pt bib EMS c/o abdominal pain. Pt recently admitted to [facility] for pancreatitis. Pt states he hasn't had a drink in 3 days but is unable to keep any food down due to n/v.

## 2018-05-05 ENCOUNTER — Ambulatory Visit: Admit: 2018-05-05 | Discharge: 2018-05-06 | Payer: PRIVATE HEALTH INSURANCE

## 2018-05-05 ENCOUNTER — Ambulatory Visit
Admit: 2018-05-05 | Discharge: 2018-05-06 | Payer: PRIVATE HEALTH INSURANCE | Attending: Pharmacist | Primary: Pharmacist

## 2018-05-05 ENCOUNTER — Encounter: Admit: 2018-05-05 | Discharge: 2018-05-06 | Payer: PRIVATE HEALTH INSURANCE

## 2018-05-05 DIAGNOSIS — Z79899 Other long term (current) drug therapy: Secondary | ICD-10-CM

## 2018-05-05 MED ORDER — ELEXACAFTOR 100MG/TEZACAFTOR 50MG/IVACAFTOR 75MG (DAY) AND IVACAFTOR 150MG (NIGHT)
ORAL_TABLET | 1 refills | 0 days | Status: CP
Start: 2018-05-05 — End: 2018-06-06
  Filled 2018-05-09: qty 84, 28d supply, fill #0

## 2018-05-05 NOTE — Unmapped (Signed)
Adult Cystic Fibrosis Clinic Pharmacist Note      Primary pulmonologist: Barnie Alderman    Walter [name] is a 37 y.o. male with cystic fibrosis (genotype: F508del/i507) being seen for annual medication review. and hospital discharge follow-up.    Medication Review    Reviewed the indication, dose, and frequency of each medication with patient.   All medications, allergies, and preferred pharmacy list were updated in EPIC medication profile    Allergies:   No Known Allergies    INHALED MEDICATIONS     BRONCHODILATOR  ? Albuterol Nebulized Solution BID. Taking differently: once daily  MUCOLYTICS    ? Hypertonic Saline 7%: BID.  Taking differently: once daily  ? Pulmozyme: Daily.  Denies missed doses  INHALED ANTIBIOTICS  ? None.   OTHER  ? None     CHRONIC MEDICATIONS       CFTR MODULATOR   ? No previous modulator     ID/ABX   Chronic anti-inflammatory and/or suppressive antibiotics:  ? None   NTM:  ? Not applicable (AFB negative)     FEN/GI     Pancreatic Enzyme:   ? Zenpep 20,000 units: Take 12 capsules with meals and 3 capsules with snacks. Prescribed, takes differently. 12 caps with meals and 6 caps with snacks. Averages 2 meals/day and 2 snacks/day.   Reflux/GERD:  ? Pantoprazole 40mg  BID. Denies missed doses.   ? Phenergan 25 mg Q6h Prn. Taking for nausea, currently out of medication needing refill.   Appetite/Other:   ? Miralax 17g BID. Not taking.  ? Senna 8.6 mg, take 2 tabs BID Not taking.   Vitamins:   ? MVW Complete Softgels:2 capsule(s) daily   Not taking.   MagOx 400, take 2 tabs daily. Not taking.        ENDOCRINE/BONE     CFRD:   ? Lantus (glargine): 24 units SQ daily. Taking differently than prescribed: 26 Units.     NEURO/PSYCH      Anxiety/Depression:  ? Mirtazapine 45mg  nightly. Denies missed doses.  ? Olanzapine 15mg  nightly. Denies missed doses.  ? Trazodone 50mg  nightly. Denies missed doses.  ? Gabapentin 1200mg  QAM, 900mg  at noon, 900mg  in evening mg  . Denies missed doses.  ? Buspirone 15mg  3 times a day. Denies missed doses.     OTHER   Lidoderm Patch 5% *Not taking.   Percocet 5-325 Taking Q6h  Ibuprofen 200 mg: take 800 mg QID PRN. Not taking.  Fludrocortisone 0.1mg  daily. 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     Patient identified barriers to adherence:  Lack of perceived benefit  uninterested     Reported Side Effects: None     Objective Data     Height and Weight:  Ht: 183cm (6')  Wt: 73.5kg (162lb)    Lung function:   Date FEV1   05/05/18 23.9%   04/08/18 29.3%   09/13/17 35.2%     Sputum culture history:   04/07/18: Mucoid Pseudomonas aeruginosa, Smooth Pseudomonas aeruginosa, Achromobacter sp., OPF  01/12/18: Mucoid Pseudomonas aeruginosa, MRSA, Achromobacter sp.   11/17/17: Mucoid Pseudomonas aeruginosa, Smooth Pseudomonas aeruginosa, MRSA, Achromobacter sp., OPF    Last AFB completed:   04/07/18  Negative    Exacerbations:  Mon/Yr (Abx)  2019   1/19: Cefepime, Tobramycin 600mg  IV Q24H  3/19: Tobramycin 540mg  IV Q24H-->Q36H, Piperacillin-tazobactam 4.5g IV Q6H-->Ceftazidime then transitioned to ciprofloxacin, linezolid  6/19: Tobramycin 480mg  IV Q24H, vancomycin 1000mg  IV Q12H, cefepime (d/t rash)-->meropenem  8/19: meropenem, ceftaroline, ciprofloxacin  10-11/19: Ciprofloxacin, ceftaroline    Last Vitamin Levels:   01/19/18: D (25OH) 23.2 ng/mL  03/19/16 : A 22.4 mcg/dL;  E 4.5 mg/L    Last OGTT:   N/A, +CFRD    Last LFTs:  Lab Results   Component Value Date    ALKPHOS 380 (H) 04/28/2018    BILITOT 0.4 04/28/2018    BILIDIR <0.10 04/10/2018    PROT 6.1 04/28/2018    ALBUMIN 3.1 (L) 04/28/2018    ALT 25 04/28/2018    AST 32 04/28/2018       Assessment/Recommendations     CYSTIC FIBROSIS WITH PULMONARY INVOLVEMENT:   -Severe lung function (FEV1 <40%). Lung function has declined.  -Inhaled Therapies: Airway clearance medications as above. Recommended changes: BID instead of QDay use of airway clerance regimen (albuterol + hypertonic saline).  -CFTR Modulator: Not currently on modulator but is now eligible for TRIKAFTA. Recommended changes: TRIKAFTA initiation once medication is received. Counseled by CF provider on dosing/administration and potential side effects.     ID/ABX:   -Primary grows Mucoid Pseudomonas aeruginosa, Smooth Pseudomonas aeruginosa, MRSA, Achromobacter sp.Walter [name] Has frequent exacerbations (> 3 in the last year).   -Cycled Antibiotics: No cycled antibiotics. Recommended changes: Consider initiating cycled inhaled tobramycin. Likely considered in past but hard to initiated with compliance. Also could consider adding chronic azithromycin.  -NTM History: No history of NTM growth/infection. No changes, not on therapy.    FEN/GI:   -Pancreatic Enzymes: Current enzyme dose as above. Per patients reported amount of meals/snacks per day, within daily limits of units of lipase/kg/day. No GI complaints or changes in stool. No changes, continue. Could consider switching to ZENPEP 40,000 units and help decrease the pill burden to 6 caps with meals and 3 caps with snacks.  -GERD: Reflux symptoms controlled on Protonix 40mg  BID. Continue for GERD.  -Bowel Regimen: prescribed regimen as described above, not taking because patient reports he does not need. Has if he is constipated.    -Appetite Stimulant: Continue Remeron for appetite.  -Vitamins: Last vitamin levels not up to date, need to recheck. Should be on CF vitamin, but chooses not to take .  Recommended changes: Initation of CF Vitamin.    ENDOCRINE:  -CFRD:Reported BGs: not in goal, many hypoglycemic episodes. Using insulin regimen appropriately. Continue to monitor BGs at home (fasting, before meals, and at bedtime) and log with insulin requirements. At one point he was using both glargine and lispro. He currently has no lispro on his medication list.    NEURO/PSYCH:  -Insomnia: On medication therapy,Trazodone 50mg  nightly. Continue. Melatonin 3mg  nightly, use unknown.  -Zyprexa 15mg  QHS. Continue.   -Buspar 15mg  TID. Continue.     Pertinent drug Interactions noted: No   Recommended labs to be monitored: Yes-LFTs, at baseline, at minimum Q3 months for the first year, and annually thereafter once TRIKAFTA is initiated.      Medication management:  Adherence: Poor compliance: Misses many doses during the week; Encouraged increased adherence.  Patient chooses not to take many of his oral medications at all.  Understands how to refill medications and all assistance programs: Yes     Access of CF Medications   -Pulmozyme fills at [facility]  -Trikafta fills at [facility]    Eligible Copay Assistance Program(s):   - [employer] CF Treatment Grant through 03/08/18 (need to re-enroll)  -  ZenPep Live2Thrive  - Pulmozyme Copay Card  -[employer] GPS Program    Summary of Chronic Medication Changes:  - Initiate TRIKAFTA at full dose. Caution with liver function d/t cirrhosis.   - Consider: changing Zenpep to 40,000's to help with pill burden  - Consider: chronic azithromycin or cycled inhaled tobramycin (but low threshold to start at this point since his compliance is low)  - Endocrine follow-up: insulin regimen (just has glargine for basal but at one point requires 14 units of lispro with meals)    Time spent with patient: 10 minutes    Entered by Drenda Freeze, PharmD Candidate, acting as scribe for Donzetta Kohut, CPP.     The documentation recorded by the scribe accurately reflects the service I personally performed and the decisions made by me. We discussed the patient in detail and any interventions made to the providers.    Signature:  Anell Barr, PharmD, BCPS, CPP  Clinical Pharmacist Practitioner  [facility] Adult Cystic Fibrosis [facility]  [facility]  Pager: [phone]     May 09, 2018 12:24 PM.

## 2018-05-05 NOTE — Unmapped (Signed)
Thank you for allowing [place] to be a part of your care!     One goal I would like to achieve between today and my next visit:     1. We are working on getting Trikafta  2. Cont to keep appointments with psychiatry  3. Cont NOT to drink alcohol or use other substances not prescribed  4. Call us if you have any questions or concerns              To reach your CF nurse coordinators Harriett Sine & [place]) :  Phone: [phone]     For urgent issues after hours:  Hospital Operator: [phone]) [phone], ask for Pulmonary Fellow on-call     To make or change a clinic appointment:   [facility]: [phone]     --> When you should use MyChart:           - Order a prescription refill          - View test results          - Send a non-urgent message or update to the care team          - View after-visit summaries           - See or pay bills      --> When you should call (NOT use MyChart)           - Increase in cough          - Change in amount of mucus or mucus color           - Coughing up blood or blood-tinged mucus          - Chest pain          - Shortness of breath           - Lack of energy, feeling sick, or increase in tiredness     --> What phone number should you call?          - Office hours, Monday-Friday 9am-4pm: call [phone]         - After hours: call [phone], ask for the on-call Pulmonlogist.             There is someone available 24/7!      --> I don't have a MyChart. Why should I get one?           - It's encrypted, so your information is secure          - It's a quick, easy way to contact the care team, manage appointments, see test results, and more!      --> How do I sign-up for MyChart?            - Download the MyChart app from the [employer] or [employer] and sign-up in the app           - Sign-up online at [email or web address]            TRIKAFTA (elexacaftor/tezacaftor/ivacaftor and ivacaftor)     Used for the treatment of cystic fibrosis (CF) in patients aged 12 years and older who have at least 1 copy of the F508del mutation.    Medication & Administration     How Supplied:   ? Each month supply of TRIKAFTA comes as a 84-count tablet carton that contains 4 weekly wallets, each wallet contains 14 tablets of elexacaftor/tezacaftor/ivacaftor and 7 tablets of ivacaftor)  ? TRIKAFTA is co-packaged  as a fixed dose combination tablet of elexacaftor/tezacaftor/ivacaftor and a separate ivacaftor tablet  o 2 orange tablets (marked T100) as the combination of elexacaftor, tezacaftor and ivacaftor.   o The blue tablet (marked V150) contains only ivacaftor.     Dosing: Patients 12 years and older: 2 orange tablets (elexacaftor 100mg /tezacaftor 50mg /ivacaftor 75mg ) and 1 blue tablet (ivacaftor 150mg ) in the evening, 12 hours apart. Taken with fatty food.    Administration:   ? Take with food that contains high fat.  o Examples: eggs, butter, peanut butter, nuts, avocado, or whole-milk dairy products.     Missed dose instructions:  ? If AM dose missed (ORANGE tablets):  If less than 6 hours since your usual AM dose: Take missed AM dose as soon as you remember Take PM dose at the usual time   If more than 6 hours since your usual AM dose: Take missed AM dose as soon as you remember SKIP the PM dose that day     ? If you missed the PM dose (BLUE tablets):  If less than 6 hours since your usual PM dose: Take missed PM dose as soon as you remember Take tomorrow's AM dose at the usual time   If more than 6 hours since your usual PM dose: SKIP the PM dose that day Take tomorrow's AM dose at the usual time     Storage:   ? Store at room temperature    Common Side Effects     ? Rash   ? Abdominal pain and/or diarrhea  ? Headache, dizziness  ? Runny nose  ? Influenza     Warning and Precautions     Please note that some patients have experienced a temporary increase in their sputum production shortly after starting TRIKAFTA. This may be a sign that TRIKAFTA is working to help clear your secretions. If you feel sick, short of breath, have a fever, or blood in your sputum, please call the CF Clinic as you normally would.    Monitoring:  ? For adult patients: Monitor for hepatic impairment.    Hepatic Impairment:   ? Your team with check your liver function tests (LFTs) prior to starting treatment and 3 months the first year you are on TRIKAFTA to assess any changes that may occur.   ? If stable after the first year, your liver function tests will be checked annually.  ? Notify your physician if you start noticing any of the following symptoms:  o Yellowing of the skin or the white part of your eyes (jaundice)  o Nausea or vomiting, diarrhea, or abdominal pain  o Dark or amber-colored urine  o Bruising or bleeding, itching, rash  o Fever    Interactions:  ? If you start to take any new medications please check with your CF provider prior to use as it may interact with TRIKAFTA.  ? Most common medications that interact: Strong and Moderate CYP3A4 Inhibitors (such as the azole antifungals) and Strong CYP3A4 Inducers (such as rifampin).

## 2018-05-05 NOTE — Unmapped (Signed)
Per test claim for Trikafta at the [facility], patient needs Medication Assistance Program for Prior Authorization.

## 2018-05-06 NOTE — Unmapped (Signed)
-----   Message from Dellis Filbert, MD sent at 05/06/2018 11:52 AM EST -----  Regarding: RE: 2 PATIENTS NEEDED 2-4 WEEK F/U' S  Yikes... 8 is a lot of CF... is ok but no more.Marland KitchenS  ----- Message -----  From: Vicente Masson  Sent: 05/05/2018   2:48 PM EST  To: Dorna Bloom, RN, Cicero Duck, RN, #  Subject: 2 PATIENTS NEEDED 2-4 WEEK F/U' S                Please let me know if ok to add a patient on for 12pm on 06/02/18?    I have the other patient in the 1230 slot, hospital f/u J. Alexander Mt

## 2018-05-09 MED FILL — ELEXACAFTOR 100MG/TEZACAFTOR 50MG/IVACAFTOR 75MG (DAY) AND IVACAFTOR 150MG (NIGHT): 28 days supply | Qty: 84 | Fill #0 | Status: AC

## 2018-05-09 NOTE — Unmapped (Signed)
[facility] Specialty Medication Referral: [place] APPROVED    Medication (Brand/Generic): Trikafta    Initial Test Claim completed with resulted information below:    Patient ABLE to fill at [facility]  Insurance Company:  [employer] [place]  Anticipated Copay: $0  Is anticipated copay with a copay card or grant? No    Does patient's insurance plan only allow a 15 day supply for the first 6 fills in the Split Fill Program? No  If yes, inform patient they can request to dis-enroll from the [facility] by calling the patient help desk at N/A.      If the copay is under the $25 defined limit, per policy there will be no further investigation of need for financial assistance at this time unless patient requests. This referral has been communicated to the provider and handed off to the [facility] team for further processing and filling of prescribed medication.   ______________________________________________________________________  Please utilize this referral for viewing purposes as it will serve as the central location for all relevant documentation and updates.

## 2018-05-09 NOTE — Unmapped (Signed)
PT NO-SHOWED HIS APPT WITH JAMIE SMOLKO, NP ON 11/25.  ATTEMPTED CALLING PT TO RESCHEDULE.  NO ANSWER OR VM PICK-UP/COULD NOT LEAVE A MESSAGE.

## 2018-05-09 NOTE — Unmapped (Signed)
[name] Visit       Assessment and Plan:      1.  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 Hopefully will receive significant benefit from Trikafta - will start process today. Considered dosing, as he has some known liver disease,but without synthetic dysfunction or stigmata of portal htn.  WIll use full dose and monitor monthly. May need clean out if can remain stable/sober and willing.  2. Malnutrition:  Likely due to lung disease, uncontrolled diabetes and alcoholism. RD consult in hospital. Reassess closely/monthly  3.  Depression/anxiety, bipolar disorder, substance abuse: Cont psychiatric care (every 6 weeks).  4.  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 He has had an ongoing, unstable course with admissions for abd pain after binge drinking, ongoing significant anxiety, out of control CFRD, as well as severe lung  disease. He left [facility] recently AMA because they weren't going to give him treatment for abd pain.     Since leaving hospital 04/27/18, he states that he has not resumed drinking. Still living with same 2 roommates.  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     [name] is fairly limited from resp standpoint - get SOB fairly easily. Abd pain has not recurred - he clearly attributes this to gastritis from drinking. He is doing airway clearance 1-2X per day. He is checking blood sugars several time a day, and states it is reasonably well controlled.    Feels anxious - an ongoing issue. Came to clinic today for first time in nearly 2 years because he is very interested in starting Trikafta.     Medication:  Current Outpatient Medications   Medication Sig Dispense Refill   ??? albuterol 2.5 mg /3 mL (0.083 %) nebulizer solution Inhale 2.5 mg by nebulization 2 (two) times a day.     ??? dornase alfa (PULMOZYME) 1 mg/mL nebulizer solution Inhale the contents of 1 vial (2.5 mg) via nebulizer once daily. 225 mL 3   ??? elexacaftor 100mg /tezacaftor 50mg /ivacaftor 75mg  and ivacaftor 150mg  (TRIKAFTA) tablets Take 2 tablets (elexacaftor 100mg /tezacaftor 50mg /ivacaftor 75mg ) by mouth in the morning and 1 tablet (ivacaftor 150mg ) in the evening with fatty food. 84 tablet 1   ??? gabapentin (NEURONTIN) 300 MG capsule Take 4 capsules (1200mg ) in the morning and 3 capsule (900mg ) at 12p and bedtime. 300 capsule 1   ??? insulin glargine (LANTUS) 100 unit/mL injection Inject 24 Units under the skin nightly. Taking 26 units nightly     ??? lidocaine (LIDODERM) 5 % patch Place 1 patch on the skin daily. Apply to affected area for 12 hours only each day (then remove patch)     ??? lipase-protease-amylase (ZENPEP) 20,000-63,000- 84,000 unit CpDR capsule, delayed release 12 caps with meals and 6 caps with snacks     ??? melatonin 3 mg Tab Take 2 tablets (6 mg total) by mouth every evening. 30 tablet 0   ??? mirtazapine (REMERON) 45 MG tablet Take 1 tablet (45 mg total) by mouth nightly. 30 tablet 1   ??? MVW Complete, pediatric multivit 61-D3-vit K, 1,500-800 unit-mcg cap Take 2 capsules by mouth daily. (Patient not taking: Reported on 05/05/2018) 60 each 1   ??? nebulizers (LC PLUS) Misc Pari LC Plus Reusable Cup  2 each 11   ??? OLANZapine (ZYPREXA) 15 MG tablet Take 1 tablet (15 mg total) by mouth nightly. 30 tablet 1   ??? pantoprazole (PROTONIX) 40 MG tablet Take 1 tablet (40 mg total) by mouth Two (2) times a day. 180 tablet 2   ??? promethazine (PHENERGAN) 25 MG tablet Take 25 mg by mouth every six (6) hours as needed for nausea.     ??? sodium chloride 7% 7 % Nebu Inhale 4 mL by nebulization two (2) times a day. 720 mL 3   ??? traZODone (DESYREL) 50 MG tablet Take 1 tablet (50 mg total) by mouth nightly. 30 tablet 1     No current facility-administered medications for this visit.    ** STATES HE IS TAKING #10 ZENPEP 20,000 CAPSULES PER MEAL AT HOME    Allergies:  No Known Allergies    Problem List:  Patient Active Problem List   Diagnosis   ??? Cirrhosis (CMS-HCC)   ??? Essential hypertension (RAF-HCC)   ??? Internal hemorrhoids   ??? Chronic abdominal pain   ??? Chronic pancreatitis (CMS-HCC)   ??? Cystic fibrosis exacerbation (CMS-HCC)   ??? Dysthymic disorder   ??? 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 Intentional drug overdose (CMS-HCC)   ??? Alcohol use disorder, severe, in controlled environment (CMS-HCC)   ??? Bipolar disorder, most recent episode depressed (CMS-HCC)   ??? Major depressive disorder   ??? Vitamin D deficiency   ??? Thyroid goiter (RAF-HCC)   ??? Tobacco use   ??? DIOS (distal intestinal obstruction syndrome) (CMS-HCC)   ??? Elevated blood pressure reading   ??? Abdominal pain   ??? Anxiety   ??? Cystic fibrosis with pulmonary exacerbation (CMS-HCC)   ??? Suicidal ideation   ??? Alcoholic intoxication without complication (CMS-HCC)   ??? Opioid use disorder, severe, dependence (CMS-HCC)   ??? Cystic fibrosis with liver disease (CMS-HCC)   ??? Bronchiectasis with acute exacerbation (CMS-HCC)   ??? Bipolar affective disorder in remission (CMS-HCC)   ??? Adjustment disorder with mixed disturbance of emotions and conduct   ??? Malnutrition (CMS-HCC)   ??? 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 Depression   ??? Alcohol use disorder, severe, in early remission, in controlled environment (CMS-HCC)   ??? Anxiety disorder, unspecified (r/o generalized anxiety disorder)   ??? Insomnia   ??? 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 Living with other substance abuse program graduates. Not smoking    Review of Systems:  A 12 point review of systems was negative except for pertinent items noted in the HPI.        Objective:     On examination:   Vitals:    05/05/18 1203   BP: 135/101   Pulse: 92   Temp: 36.9 ??C (98.4 ??F)   SpO2: 94%     Body mass index is 21.94 kg/m??.    Gen: no acute distress; looks thinn; normal eye contact, speech  Resp: bilateral UL  crackles  No wheezing  CV: RRR without m/r/g  Abd: soft, NT,  without organomegaly   Ext: clubbed; without cyanosis or edema  Skin: no spiders, no palmar erythema    Spirometry        C/w severe obstruction, worse than baseline    CF Annual Labs  LFTs:  Lab Results   Component Value Date/Time  BILITOT 0.4 04/28/2018 07:40 PM    BILITOT 0.7 10/27/2013 06:23 AM    ALKPHOS 380 (H) 04/28/2018 07:40 PM    ALKPHOS 110 10/27/2013 06:23 AM    AST 32 04/28/2018 07:40 PM    AST 21 10/27/2013 06:23 AM    ALT 25 04/28/2018 07:40 PM    ALT 62 10/27/2013 06:23 AM    GGT 29 03/27/2016 05:40 PM    GGT 43 10/31/2010 11:15 AM    PROT 6.1 04/28/2018 07:40 PM    PROT 6.0 (L) 10/27/2013 06:23 AM    ALBUMIN 3.1 (L) 04/28/2018 07:40 PM    ALBUMIN 3.4 (L) 10/27/2013 06:23 AM

## 2018-05-09 NOTE — Unmapped (Signed)
[facility]   Patient Onboarding/Medication Counseling    Mr.Grennan is a 37 y.o. male with Cystic Fribrosis who I am counseling today on initiation of therapy.    Verified patient's date of birth / HIPAA.    Specialty medication(s) to be sent: CF/Pulmonary: -Trikafta      Non-specialty medications to be sent: n/a      Medications not needed at this time: denied Pulmozyme, HTS, ask next month         Denied all counseling as patient is comfortable with the medication     Comorbidities and Allergies     Reviewed and up to date in Epic.    Appropriateness of Therapy     Verified therapy is appropriate and should continue    Financial Information     Medication Assistance provided: Prior Authorization    Anticipated copay of $0 reviewed with patient. Verified delivery address.    Delivery Information     Scheduled Delivery Date: 05/10/18    Medication will be delivered via Next Day Courier to the home address in [place].  This shipment will not require a signature.      Explained the services we provide at [facility] and that each month we would call to set up refills.  Stressed importance of returning phone calls so that we could ensure they receive their medications in time each month.  Informed patient that we should be setting up refills 7-10 days prior to when they will run out of medication.  Informed patient that a welcome packet and a drug information handout will be sent.      Patient verbalized understanding of the above information as well as how to contact the pharmacy at [phone] option 4 with any questions/concerns.  The pharmacy is open Monday through Friday 8:30am-4:30pm.  A pharmacist is available 24/7 via pager to answer any clinical questions they may have.    Patient Specific Needs     ? Patient has no physical, cognitive, or cultural barriers.    ? Patient prefers to have medications discussed with  Patient     ? Patient is able to read and understand education materials at a high school level or above.    ? Patient's primary language is  English       Julianne Rice  [facility] Specialty Pharmacist                [facility] Refill Coordination Note  Medication: Pulmozyme 1mg /ml,  Neb Cup    Unable to reach patient to schedule shipment for medication being filled at [facility]. Does not have voicemail set-up.  As this is the 3rd unsuccessful attempt to reach the patient, no additional phone call attempts will be made at this time.      Phone numbers attempted:   [phone] (Cell)  [phone]   Last scheduled delivery: 03/16/18  Dates Contacted: 11/11, 11/20, 11/25    Please call the [facility] at [phone] (option 4) should you have any further questions.      Thanks,  Baker Janus  [facility]   [phone] opt 4

## 2018-05-16 NOTE — Unmapped (Signed)
I have reviewed the note by the Pharm D and agree with the assessment and plan.    Ellwyn Ergle H. Lurena Nida, MD  Professor of Medicine

## 2018-05-18 NOTE — Unmapped (Signed)
[facility]  Adult Cystic Fibrosis Clinic    As part of [facility] [employer], [place] mailed patient [phone] gift card for food due to financial difficulties and associated food insecurity.        Mercy Moore, LCSW

## 2018-05-30 MED ORDER — OLANZAPINE 15 MG TABLET
ORAL_TABLET | Freq: Every evening | ORAL | 0 refills | 0 days | Status: CP
Start: 2018-05-30 — End: 2018-06-02

## 2018-05-30 NOTE — Unmapped (Signed)
This is a patient of Dr. Guinevere Scarlet. This patient did not attend his last follow up appointment

## 2018-05-30 NOTE — Unmapped (Signed)
Called patient but there's no voicemail box set up to leave a message.

## 2018-05-31 NOTE — Unmapped (Signed)
Spoke with patient he agreed to schedule an appointment with Dr Thurston Hole Ruminjo.

## 2018-06-01 NOTE — Unmapped (Signed)
Dr. Sherri Rad:    Please see scanned [employer] [facility] Program letter for [name], MR# [id number], in Epic under the Media tab for review.  Thank you.

## 2018-06-01 NOTE — Unmapped (Signed)
[facility]  Psychiatry   Established Patient E&M Service - Outpatient     Assessment:  Walter [name] is a 37 y.o. male with a history of bipolar disorder, alcohol use disorder,  history of cystic fibrosis, cirrhosis, pancreatic insufficiency, and diabetes who presents for follow-up evaluation. Chart reviewed today.  He reports mood has been good, mild anxiety but denies panic attacks. Denies SI/AVH/PI.  He reports that he has been sober for the last 10 months and no longer attending AA meetings. No side effects from medications.  No medication changes today.     Risk Assessment:  A suicide and violence risk assessment was performed as part of this evaluation. There patient is deemed to be at chronic elevated risk for self-harm/suicide given the following factors: male age 19-35, chronic severe medical condition, chronic mental illness > 5 years and past substance abuse. The patient is deemed to be at chronic elevated risk for violence given the following factors: male gender and younger age. These risk factors are mitigated by the following factors:lack of active SI/HI, no know access to weapons or firearms, expresses purpose for living, safe housing and presence of a safety plan with follow-up care. There is no acute risk for suicide or violence at this time. The patient was educated about relevant modifiable risk factors including following recommendations for treatment of psychiatric illness and abstaining from substance abuse.    While future psychiatric events cannot be accurately predicted, the patient does not currently require acute inpatient psychiatric care and does not currently meet [facility] involuntary commitment criteria.      Stressors: chronic mental illness   Plan:  #Depression, unspecified (h/o bipolar disorder) -   -Continue with zyprexa 15mg  QHS   -Continue with mirtazapine 45mg  qHS   -continue with buspar 15mg  TID  -Not in OPT      # Metabolic Monitoring:labs reviewed Personal Medical History: Diabetes      Height: 182.9 cm (6')  Weight: 71.8 kg (158 lb 6.4 oz)  BMI: Body mass index is 21.48 kg/m??.  Waist circumference:      Lab Results   Component Value Date    A1C 13.3 (H) 04/06/2018     Lab Results   Component Value Date    CHOL 116 01/29/2018    CHOL 137 10/03/2016    CHOL 109 05/13/2016     Lab Results   Component Value Date    HDL 61 (H) 01/29/2018    HDL 59 10/03/2016    HDL 59 05/13/2016     No components found for: LDLCALC,  LDL,  LDLDIRECT  Lab Results   Component Value Date    TRIG 102 01/29/2018    TRIG 75 10/03/2016    TRIG 84 05/13/2016     No components found for: [facility]  Lab Results   Component Value Date    ALKPHOS 380 (H) 04/28/2018    BILITOT 0.4 04/28/2018    BILIDIR <0.10 04/10/2018    PROT 6.1 04/28/2018    ALBUMIN 3.1 (L) 04/28/2018    ALT 25 04/28/2018    AST 32 04/28/2018     Lab Results   Component Value Date    GGT 29 03/27/2016       Labs:  Lab Results   Component Value Date    NA 143 04/28/2018    K 3.1 (L) 04/28/2018    CL 106 04/28/2018    CO2 27.0 04/28/2018     Lab Results   Component  Value Date    WBC 7.4 04/28/2018    RBC 3.53 (L) 04/28/2018    HGB 10.3 (L) 04/28/2018    HCT 29.4 (L) 04/28/2018    MCV 83.3 04/28/2018    MCH 29.1 04/28/2018    MCHC 34.9 04/28/2018    RDW 13.3 04/28/2018    PLT 210 04/28/2018     Lab Results   Component Value Date    TSH 2.710 01/28/2018       No results found for: LITHIUM  No results found for: PHENYTOIN, PHENOBARB, VALPROATE, CBMZ  ??  #Anxiety, unspecified (consider GAD) - continue mirtazapine 45 mg QHS  -continue with gabapentin to 1200mg  QAM, 900mg  qPM and 900mg  QHS (previously 900mg  TID)- BUN/Creatinine ratio 1.83 on 04/20/18  -has been on hydroxyzine in the past and reports it doesn't work for him    #Insomnia -continue with trazodone 50mg  QHS  ??  ??  #Alcohol use disorder -encouraged continued abstinence (since 07/30/17) and encouraged attendance in AA meetings     Risks/benefits and indications for treatment with medications above were discussed with the patient. The patient asked appropriate questions, acknowledged understanding of answers, and provided informed consent to increasing doses of mirtazapine and gabapentin and continuing zyprexa and trazodone.     Psychotherapy:  No billable psychotherapy service provided.    Patient has been given this writer's contact information as well as the [facility] Psychiatry urgent line number. The patient has been instructed to call [phone] for emergencies.        Subjective:     Psychiatric Chief Concern:  Follow-up psychiatric evaluation for depression    Interval History:  Reports that has been doing well. Has a new job (11;30-8A) and coping well with that. Sleeping 6-8 hours now as he adjusts to new work schedule. Appetite had been good. Mood -has been good. No problems with energy or concentration. Denies SI/AVH/PI.   He denies any difficulty with panic attacks. Some mild anxiety but manageable. Denies agoraphobia.    Reports compliance with medications.  Denies substance abuse. No problems with ADLs. No new financial stressors. Reports positive relationships with others.  He reports that he has been sober since 07/30/17. He is living in apartment with 2 roommates.        Social History: reviewed; pertinents have been documented in the interval history section.    ROS:  As per Interval History and:  Constitutional:  fatigued  Neuro:  none / negative      Objective:    Mental Status Exam:  Appearance:    Appears stated age   Motor:   No abnormal movements   Speech/Language:    Normal rate, volume, tone, fluency   Mood:   good   Affect:   Anxious, Dysthymic and Irritable   Thought process and Associations:   Logical, linear, clear, coherent, goal directed   Abnormal/psychotic thought content:     Denies SI, HI, self harm, delusions, obsessions, paranoid ideation, or ideas of reference   Perceptual disturbances:     Denies auditory and visual hallucinations, behavior not concerning for response to internal stimuli     Orientation:   Oriented to person, place, time, and general circumstances   Attention and Concentration:   Able to fully concentrate and attend   Memory:   Immediate, short-term, long-term, and recall grossly intact    Fund of knowledge:    Consistent with level of education and development   Insight:     Fair   Judgment:  Fair   Impulse Control:   Fair       Medications: reviewed at today's visit    Vitals: There were no vitals taken for this visit.    PE:   Vital signs were reviewed.      Gait and station assessed with no abnormalities    Psychometrics:  Psych Scale Scores - Adult      Office Visit from 03/28/2018 in [facility] [place]   **PHQ-9: Severity Measure for DEPRESSION Total Score**  11 Collected on 03/28/2018 0000          Arlean Hopping, MD  06/01/2018

## 2018-06-02 ENCOUNTER — Encounter
Admit: 2018-06-02 | Discharge: 2018-06-03 | Payer: PRIVATE HEALTH INSURANCE | Attending: Psychosomatic Medicine | Primary: Psychosomatic Medicine

## 2018-06-02 ENCOUNTER — Encounter: Admit: 2018-06-02 | Discharge: 2018-06-02 | Payer: PRIVATE HEALTH INSURANCE

## 2018-06-02 DIAGNOSIS — R768 Other specified abnormal immunological findings in serum: Secondary | ICD-10-CM

## 2018-06-02 DIAGNOSIS — E089 Diabetes mellitus due to underlying condition without complications: Secondary | ICD-10-CM

## 2018-06-02 DIAGNOSIS — F329 Major depressive disorder, single episode, unspecified: Secondary | ICD-10-CM

## 2018-06-02 DIAGNOSIS — F3175 Bipolar disorder, in partial remission, most recent episode depressed: Secondary | ICD-10-CM

## 2018-06-02 DIAGNOSIS — F1021 Alcohol dependence, in remission: Principal | ICD-10-CM

## 2018-06-02 DIAGNOSIS — F411 Generalized anxiety disorder: Secondary | ICD-10-CM

## 2018-06-02 MED ORDER — PEN NEEDLE, DIABETIC 32 GAUGE X 5/32" (4 MM)
12 refills | 0 days | Status: CP
Start: 2018-06-02 — End: ?

## 2018-06-02 MED ORDER — GABAPENTIN 300 MG CAPSULE
ORAL_CAPSULE | 2 refills | 0 days | Status: CP
Start: 2018-06-02 — End: 2018-08-29

## 2018-06-02 MED ORDER — BUSPIRONE 15 MG TABLET
ORAL_TABLET | Freq: Three times a day (TID) | ORAL | 2 refills | 0 days | Status: CP
Start: 2018-06-02 — End: 2019-06-02

## 2018-06-02 MED ORDER — TRAZODONE 50 MG TABLET
ORAL_TABLET | Freq: Every evening | ORAL | 2 refills | 0 days | Status: CP
Start: 2018-06-02 — End: 2018-08-29

## 2018-06-02 MED ORDER — OLANZAPINE 15 MG TABLET
ORAL_TABLET | Freq: Every evening | ORAL | 2 refills | 0 days | Status: CP
Start: 2018-06-02 — End: 2018-08-29

## 2018-06-02 MED ORDER — MIRTAZAPINE 45 MG TABLET
ORAL_TABLET | Freq: Every evening | ORAL | 2 refills | 0.00000 days | Status: CP
Start: 2018-06-02 — End: 2018-08-29

## 2018-06-02 MED ORDER — BLOOD SUGAR DIAGNOSTIC STRIPS
ORAL_STRIP | 11 refills | 0 days | Status: CP
Start: 2018-06-02 — End: ?

## 2018-06-02 MED ORDER — PEDIATRIC MULTIVITAMIN NO.61-VIT D3 1,500 UNIT-VIT K 800 MCG CAPSULE
Freq: Every day | ORAL | 11 refills | 0.00000 days | Status: CP
Start: 2018-06-02 — End: ?

## 2018-06-02 NOTE — Unmapped (Addendum)
Thank you for allowing [place] to be a part of your care!     One goal I would like to achieve between today and my next visit:     1. Add HS with vest daily  2. Resume blood sugar testing around meals; use novolog with meals  3. Please have blood drawn today  4. Will ask diabetes clinic to make an appt with you              To reach your CF nurse coordinators [facility] [employer] [place]) :  Phone: [phone]     For urgent issues after hours:  Hospital Operator: [phone]) [phone], ask for Pulmonary Fellow on-call     To make or change a clinic appointment:   [facility]: [phone]     --> When you should use MyChart:           - Order a prescription refill          - View test results          - Send a non-urgent message or update to the care team          - View after-visit summaries           - See or pay bills      --> When you should call (NOT use MyChart)           - Increase in cough          - Change in amount of mucus or mucus color           - Coughing up blood or blood-tinged mucus          - Chest pain          - Shortness of breath           - Lack of energy, feeling sick, or increase in tiredness     --> What phone number should you call?          - Office hours, Monday-Friday 9am-4pm: call [phone]         - After hours: call [phone], ask for the on-call Pulmonlogist.             There is someone available 24/7!      --> I don't have a MyChart. Why should I get one?           - It's encrypted, so your information is secure          - It's a quick, easy way to contact the care team, manage appointments, see test results, and more!      --> How do I sign-up for MyChart?            - Download the MyChart app from the [employer] or [employer] and sign-up in the app           - Sign-up online at [email or web address]

## 2018-06-02 NOTE — Unmapped (Signed)
Attempted to see the patient today. He asked me to leave the room. He stated he did not wish to see anyone today other than Dr. Lurena Nida. Notified the nurse coordinator.

## 2018-06-02 NOTE — Unmapped (Signed)
[facility] Visit       Assessment and Plan:      1.  Cystic fibrosis: Marked improvement in lung function and symptoms and startingTrikafta. I encouraged him to resume daily hypertonic saline and vest and to get some exercise.  We will get repeat labs today, after 1 month to track after, given his underlying liver disease.  2. Malnutrition:  Likely due to lung disease, uncontrolled diabetes.  Encouraged much tighter diabetes control.  Twice a day.  3.  Depression/anxiety, bipolar disorder, substance abuse: Cont psychiatric care (every 6 weeks).  Currently at baseline on multiple medications.  4.  CF related diabetes: I refilled his glucose test strips and needles and requested a referral back to diabetes.  Encouraged him to use his NovoLog with meals as had been prescribed previously and check blood sugars at least 4 times a day.  5. 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  Check hepatitis C PCR given his antibody positivity in the past.  Checking liver function test today.    Subjective:      Karas comes in today for a routine visit.  We saw him 4 weeks ago at which time he was prescribed Trikafta.  Since then, he states that he feels like a new man.  Specifically, he states that breathing is much easier, he is able to do more, and he has less cough and sputum production.  From a psychiatric standpoint, Russell saw his psychiatrist today.  His bipolar disorder and anxiety appear to be stable.  He is sleeping at night, is eating very well, and has not resumed drinking.  He has been sober for 10 months.  He continues to work and is not using any unprescribed medications or substances.  On review of systems, his sinuses are stable, he has 2-3 stools per day which are occasionally greasy, his appetite is great but he has not gained any weight.  He ran out of glucose test strips a couple weeks ago but has not had symptoms of hypoglycemia.  Typically, he is very hyperglycemic and his last hemoglobin A1c was greater than 13.  He is taking 26 units of Lantus daily but only occasionally takes any NovoLog (16 units) after he has consumed a lot of soda.    Regarding his other CF therapies, Christia uses hypertonic saline a couple times per week.  He does not use any other inhaled medications.  He does take enzymes with each meal but does not take any vitamins.    Since leaving hospital 04/27/18, he states that he has not resumed drinking. Still living with same 2 roommates.  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       Medication:  Current Outpatient Medications   Medication Sig Dispense Refill   ??? albuterol 2.5 mg /3 mL (0.083 %) nebulizer solution Inhale 2.5 mg by nebulization 2 (two) times a day.     ??? busPIRone (BUSPAR) 15 MG tablet Take 1 tablet (15 mg total) by mouth Three (3) times a day. 90 tablet 2   ??? dornase alfa (PULMOZYME) 1 mg/mL nebulizer solution Inhale the contents of 1 vial (2.5 mg) via nebulizer once daily. 225 mL 3   ??? elexacaftor 100mg /tezacaftor 50mg /ivacaftor 75mg  and ivacaftor 150mg  (TRIKAFTA) tablets Take 2 tablets (elexacaftor 100mg /tezacaftor 50mg /ivacaftor 75mg ) by mouth in the morning and 1 tablet (ivacaftor 150mg ) in the evening with fatty food. 84 tablet 1   ??? fludrocortisone (FLORINEF) 0.1  mg tablet Take 0.1 mg by mouth daily.     ??? gabapentin (NEURONTIN) 300 MG capsule Take 4 capsules (1200mg ) in the morning and 3 capsule (900mg ) at 12p and bedtime. 300 capsule 2   ??? insulin ASPART (NOVOLOG) 100 unit/mL injection Inject 10 Units under the skin Three (3) times a day before meals. Sliding scale     ??? insulin glargine (LANTUS) 100 unit/mL injection Inject 26 Units under the skin nightly.      ??? lipase-protease-amylase (ZENPEP) 20,000-63,000- 84,000 unit CpDR capsule, delayed release 12 caps with meals and 6 caps with snacks     ??? mirtazapine (REMERON) 45 MG tablet Take 1 tablet (45 mg total) by mouth nightly. 30 tablet 2   ??? nebulizers (LC PLUS) Misc Pari LC Plus Reusable Cup 2 each 11   ??? OLANZapine (ZYPREXA) 15 MG tablet Take 1 tablet (15 mg total) by mouth nightly. 30 tablet 2   ??? pantoprazole (PROTONIX) 40 MG tablet Take 1 tablet (40 mg total) by mouth Two (2) times a day. 180 tablet 2   ??? sodium chloride 7% 7 % Nebu Inhale 4 mL by nebulization two (2) times a day. 720 mL 3   ??? traZODone (DESYREL) 50 MG tablet Take 1 tablet (50 mg total) by mouth nightly. 30 tablet 2   ??? blood sugar diagnostic (CONTOUR NEXT TEST STRIPS) Strp by Other route Five (5) times a day. 150 strip 11   ??? melatonin 3 mg Tab Take 2 tablets (6 mg total) by mouth every evening. (Patient not taking: Reported on 06/02/2018) 30 tablet 0   ??? MVW Complete, pediatric multivit 61-D3-vit K, 1,500-800 unit-mcg cap Take 2 capsules by mouth daily. 60 each 11   ??? pen needle, diabetic (ULTRACARE PEN NEEDLE) 32 gauge x 5/32 Ndle 150 each by Miscellaneous route Five (5) times a day. 150 each 12     No current facility-administered medications for this visit.    ** STATES HE IS TAKING #10 ZENPEP 20,000 CAPSULES PER MEAL AT HOME    Allergies:  No Known Allergies    Problem List:  Patient Active Problem List   Diagnosis   ??? Essential hypertension (RAF-HCC)   ??? Internal hemorrhoids   ??? 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 Vitamin D deficiency   ??? Thyroid goiter (RAF-HCC)   ??? DIOS (distal intestinal obstruction syndrome) (CMS-HCC)   ??? Cystic fibrosis with liver disease (CMS-HCC)   ??? Hepatitis C antibody positive in blood   ??? Malnutrition (CMS-HCC)   ??? 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 Alcohol use disorder, severe, in early remission, in controlled environment (CMS-HCC)   ??? Cystic fibrosis (CMS-HCC)   ??? 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 Living with other substance abuse program graduates. Not smoking    Review of Systems:  A 12 point review of systems was negative except for pertinent items noted in the HPI.        Objective:     On examination:   Vitals:    06/02/18 1047   BP: 127/90   Pulse: 77   SpO2: 96%     Body mass index is 21.56 kg/m??.    Gen: no acute distress; somewhat disheveled; reduced eye contact, speech normal  Resp: Clear to auscultation without crackles no wheezing  CV: RRR without m/r/g  Abd:  soft, NT,  without organomegaly   Ext: clubbed; without cyanosis; trace right lower extremity edema  Skin: no spiders, no palmar erythema    Spirometry            C/w severe obstruction, marked improvement over 4 weeks  CF Annual Labs  LFTs:  Lab Results   Component Value Date/Time    BILITOT 0.4 04/28/2018 07:40 PM    BILITOT 0.7 10/27/2013 06:23 AM    ALKPHOS 380 (H) 04/28/2018 07:40 PM    ALKPHOS 110 10/27/2013 06:23 AM    AST 32 04/28/2018 07:40 PM    AST 21 10/27/2013 06:23 AM    ALT 25 04/28/2018 07:40 PM    ALT 62 10/27/2013 06:23 AM    GGT 29 03/27/2016 05:40 PM    GGT 43 10/31/2010 11:15 AM    PROT 6.1 04/28/2018 07:40 PM    PROT 6.0 (L) 10/27/2013 06:23 AM    ALBUMIN 3.1 (L) 04/28/2018 07:40 PM    ALBUMIN 3.4 (L) 10/27/2013 06:23 AM

## 2018-06-06 MED ORDER — ELEXACAFTOR 100 MG-TEZACAF 50MG-IVACAF 75MG(D)/IVACAF 150MG(N) TABLETS
ORAL_TABLET | 0 refills | 0 days | Status: CP
Start: 2018-06-06 — End: 2018-06-22
  Filled 2018-06-06: qty 84, 28d supply, fill #0

## 2018-06-06 MED FILL — ELEXACAFTOR 100MG/TEZACAFTOR 50MG/IVACAFTOR 75MG (DAY) AND IVACAFTOR 150MG (NIGHT): 28 days supply | Qty: 84 | Fill #0 | Status: AC

## 2018-06-06 NOTE — Unmapped (Signed)
[facility] Refill and Clinical Coordination Note  Medication(s): Trikafta    Walter [name], DOB: 1981-05-28  Phone: [phone] (home) , Alternate phone contact: N/A  Shipping address: [address]  [address]  [place] [facility] [place] [address]  Phone or address changes today?: No  All above HIPAA information verified.  Insurance changes? No    Completed refill and clinical call assessment today to schedule patient's medication shipment from the [facility] [phone]).      MEDICATION RECONCILIATION    Confirmed the medication and dosage are correct and have not changed: Yes, regimen is correct and unchanged.    Were there any changes to your medication(s) in the past month:  No, there are no changes reported at this time.    ADHERENCE    Is this medicine transplant or covered by Medicare Part B? No.      Did you miss any doses in the past 4 weeks? No missed doses reported.  Adherence counseling provided? Not needed     SIDE EFFECT MANAGEMENT    Are you tolerating your medication?:  Walter [name] reports tolerating the medication.  Side effect management discussed: None      Therapy is appropriate and should be continued.    Evidence of clinical benefit: See Epic note from 06/02/18      FINANCIAL/SHIPPING    Delivery Scheduled: Yes, Expected medication delivery date: 06/06/18     Medication will be delivered via Same Day Courier to the home address in [place].    Additional medications refilled: No additional medications/refills needed at this time.    The patient will receive a drug information handout for each medication shipped and additional FDA Medication Guides as required.      Walter [name] did not have any additional questions at this time.    Delivery address confirmed in Epic.     We will follow up with patient monthly for standard refill processing and delivery.      Thank you,  Verdia Kuba   [facility] Specialty Pharmacist

## 2018-06-09 NOTE — Unmapped (Signed)
Thank you, Dr. Sherri Rad.

## 2018-06-13 NOTE — Unmapped (Deleted)
ASSESSMENT/PLAN:     1. 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 Marked deterioration in the last few months due to missed insulin doses, infrequent checking, and changes in dietary habits including more Red Bull consumption and less regular meals/food  ?? Increase Lantus to 14 units every morning.  ?? Take the Novolog at meals - 4 units for small meal, 6 for medium, 8 for large  ?? Take extra Novolog at the meal if sugar is high:  ?? 2 units if BG 201-250  ?? 4 units if BG 251-300  ?? 6 units if BG 301-350  ?? 8 units if BG 351-400  ?? 10 units if BG > 400  ?? Hypoglycemia precautions and treatment reviewed. Will obtain glucose tablets to keep at work, in car, at bed side. *He acknowledges that fear of hypoglycemia prevents regular insulin administration at times, so we have agreed to aim for BG values of below 200 to start. Notify me ASAP if lows occur.   ?? BP at goal,  MA/Cr UTD 08/2015  ?? Lipid panel UTD 06/2015    ?? Eye exam needs to be updated   ??  2. Slightly firm thyroid on exam  ?? No nodules on exam, no thyromegaly  ?? TSH normal in 06/2015, repeat every 1-2 years, or earlier if clinically indicated  ??  3. Alcohol Abuse, s/p rehab  ?? Commended abstinence over the last few months  ?? Encouraged regular follow up with AA and other resources                4. 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 He has been taking vitamin D supplementation in the last few months   ?? Follow up with pulmonary   ??  5. Tobacco use/smoking, new  ?? He knows he must quit given underlying CF  ?? Reviewed available quit resources                        No follow-ups on file.               PCP: Dellis Filbert, MD     Pulm: Dr. Jackquline Denmark    Reason for Visit: Follow up CFRD     SUBJECTIVE:      Walter [name] is a 37 y.o. male who presents for follow up of CFRD. Last seen in 11/2015. At that time, I asked him to increase Lantus and to take extra Novolog at meal if sugar is high. He had started smoking.    Denies any nausea, vomiting, or diarrhea.   ??    Currently taking:  ??  Lantus 14 units every morning.  ?? Novolog at meals - 4 units for small meal, 6 for medium, 8 for large  ?? Take extra Novolog at the meal if sugar is high:  ?? 2 units if BG 201-250  ?? 4 units if BG 251-300  ?? 6 units if BG 301-350  ?? 8 units if BG 351-400  ?? 10 units if BG > 400       Weight Trend:  Wt Readings from Last 3 Encounters:   06/02/18 72.1 kg (159 lb)   06/02/18 71.8 kg (158 lb 6.4 oz)   05/05/18 73.5 kg (162 lb)       A1C Trend:  Lab Results   Component Value Date    HGB A1C, POC 8.8 (H)  11/25/2015    HGB A1C, POC 7.5 (H) 08/15/2015    HGB A1C, POC 9.3 (H) 04/16/2014    Hemoglobin A1C 13.3 (H) 04/06/2018    Hemoglobin A1C 9.6 (H) 01/12/2018    Hemoglobin A1C 11.9 (H) 11/17/2017    Hemoglobin A1C 7.0 (H) 10/26/2013        Past Medical History:  Past Medical History:   Diagnosis Date   ??? Alcohol abuse    ??? 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 Bipolar disorder (CMS-HCC)    ??? Chronic pancreatitis (CMS-HCC)    ??? Chronic sinusitis    ??? 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 Cystic fibrosis (CMS-HCC)    ??? Disease of thyroid gland    ??? Hypertension    ??? Kidney stones    ??? Portal hypertension (CMS-HCC)        Past Surgical History:  Past Surgical History:   Procedure Laterality Date   ??? CHOLECYSTECTOMY  2008   ??? sinus surgery     ??? TONSILLECTOMY         Current Medications:    Current Outpatient Medications:   ???  albuterol 2.5 mg /3 mL (0.083 %) nebulizer solution, Inhale 2.5 mg by nebulization 2 (two) times a day., Disp: , Rfl:   ???  blood sugar diagnostic (CONTOUR NEXT TEST STRIPS) Strp, by Other route Five (5) times a day., Disp: 150 strip, Rfl: 11  ???  busPIRone (BUSPAR) 15 MG tablet, Take 1 tablet (15 mg total) by mouth Three (3) times a day., Disp: 90 tablet, Rfl: 2  ???  dornase alfa (PULMOZYME) 1 mg/mL nebulizer solution, Inhale the contents of 1 vial (2.5 mg) via nebulizer once daily., Disp: 225 mL, Rfl: 3  ???  elexacaftor-tezacaftor-ivacaft (TRIKAFTA) tablet, Take 2 tablets (elexacaftor 100mg /tezacaftor 50mg /ivacaftor 75mg ) by mouth in the morning and 1 tablet (ivacaftor 150mg ) in the evening with fatty food., Disp: 84 tablet, Rfl: 0  ???  fludrocortisone (FLORINEF) 0.1 mg tablet, Take 0.1 mg by mouth daily., Disp: , Rfl:   ???  gabapentin (NEURONTIN) 300 MG capsule, Take 4 capsules (1200mg ) in the morning and 3 capsule (900mg ) at 12p and bedtime., Disp: 300 capsule, Rfl: 2  ???  insulin ASPART (NOVOLOG) 100 unit/mL injection, Inject 10 Units under the skin Three (3) times a day before meals. Sliding scale, Disp: , Rfl:   ???  insulin glargine (LANTUS) 100 unit/mL injection, Inject 26 Units under the skin nightly. , Disp: , Rfl:   ???  lipase-protease-amylase (ZENPEP) 20,000-63,000- 84,000 unit CpDR capsule, delayed release, 12 caps with meals and 6 caps with snacks, Disp: , Rfl:   ???  melatonin 3 mg Tab, Take 2 tablets (6 mg total) by mouth every evening. (Patient not taking: Reported on 06/02/2018), Disp: 30 tablet, Rfl: 0  ???  mirtazapine (REMERON) 45 MG tablet, Take 1 tablet (45 mg total) by mouth nightly., Disp: 30 tablet, Rfl: 2  ???  MVW Complete, pediatric multivit 61-D3-vit K, 1,500-800 unit-mcg cap, Take 2 capsules by mouth daily., Disp: 60 each, Rfl: 11  ???  nebulizers (LC PLUS) Misc, Pari LC Plus Reusable Cup, Disp: 2 each, Rfl: 11  ???  OLANZapine (ZYPREXA) 15 MG tablet, Take 1 tablet (15 mg total) by mouth nightly., Disp: 30 tablet, Rfl: 2  ???  pen needle, diabetic (ULTRACARE PEN NEEDLE) 32 gauge x 5/32 Ndle, 150 each by Miscellaneous route Five (5) times a day., Disp: 150 each, Rfl: 12  ???  sodium chloride 7% 7 % Nebu, Inhale 4 mL by nebulization two (2) times a day., Disp: 720 mL, Rfl: 3  ???  traZODone (DESYREL) 50 MG tablet, Take 1 tablet (50 mg total) by mouth nightly., Disp: 30 tablet, Rfl: 2    Allergies:  No Known Allergies    Family History:  Family History   Problem Relation Age of Onset   ??? Diabetes Maternal Grandmother    ??? Diabetes Maternal Grandfather    ??? No Known Problems Mother    ??? Cancer Father    ??? 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       OBJECTIVE:     There were no vitals taken for this visit.  General appearance - alert, well appearing, and in no distress  Eyes -  No lid lag or stare, no proptosis, EOM's intact  Neck - supple, thyroid is normal in size without nodules or tenderness  Lymphatics - no cervical or supraclavicular adenopathy appreciated   Chest - clear to auscultation bilaterally  Heart - normal rate, regular rhythm  Neurological - no hand tremors, 2+ upper extremity DTRs  Extremities - peripheral pulses normal, no lower extremity edema  Skin - warm, dry, no visible rashes  MSK/Neuro - UE reflexes normal and symmetric, and gait steady; no spine deformity  Feet - 08/3015 sensation intact to the monofilament. No ulcers/sores, +clubbing        Laboratory Review:    Lab Results   Component Value Date    A1C 13.3 (H) 04/06/2018     Lab Results   Component Value Date    POCGLU 62 (L) 04/27/2018       Lab Results   Component Value Date    NA 143 04/28/2018    K 3.1 (L) 04/28/2018    CL 106 04/28/2018    CO2 27.0 04/28/2018    BUN 5 (L) 04/28/2018    CREATININE 1.26 (H) 04/28/2018    GFRNONAA >=60 12/04/2017    GFRAA >=60 12/04/2017    CALCIUM 7.9 (L) 04/28/2018    ALBUMIN 3.1 (L) 04/28/2018    PHOS 3.4 04/27/2018       Lab Results   Component Value Date    CHOL 116 01/29/2018     Lab Results   Component Value Date    LDL 35 (L) 01/29/2018     Lab Results   Component Value Date    HDL 61 (H) 01/29/2018     Lab Results   Component Value Date    TRIG 102 01/29/2018     Lab Results Component Value Date    ALT 25 04/28/2018       Lab Results   Component Value Date    Albumin Quantitative, Urine 1.2 08/15/2015    Albumin/Creatinine Ratio 5.9 08/15/2015     No results found for: LABCREA, MICROALBQTUR, MSHCGMOM, MALBCRERAT  No results found for: PROTEINUR, LABCREA, PCRATIOUR  No results found for: VITAMINB12  Lab Results   Component Value Date    VITDTOTAL 23.2 01/29/2018       Lab Results   Component Value Date  TSH 2.710 01/28/2018       Component      Latest Ref Rng 08/15/2015   Albumin/Creatinine Ratio      0.0-30.0 ug/mg 5.9   Albumin Quantitative, Urine       1.2   Creatinine, Urine      Undefined mg/dL 161.0   ??  ??  Labs from [place], obtained through Care Everywhere:  ??    ??    ??  Vitamin D was 13 [in setting of having stopped vit D supplements x few months], previously was >30 just ~ 6 months months prior.  ??  TSH 1.59  ??    Radiology:       Other Medical Data:              This note was entered by Ronne Binning, acting as scribe for T. Jackquline Denmark, MD.  SignatureShannan Harper  Date: 06/13/18  Time: 12:54 PM    I have reviewed the documentation provided by the scribe and confirm that it accurately reflects the service I personally performed and the decisions made by me.  Signature: ***  Date:06/13/18  Time: 12:54 PM

## 2018-06-22 MED ORDER — ELEXACAFTOR 100 MG-TEZACAF 50MG-IVACAF 75MG(D)/IVACAF 150MG(N) TABLETS
ORAL_TABLET | 0 refills | 0 days | Status: CP
Start: 2018-06-22 — End: 2018-08-08
  Filled 2018-07-01: qty 84, 28d supply, fill #0

## 2018-06-22 NOTE — Unmapped (Addendum)
[name] denied Pulmozyme and HTS due to cost. He is ok with $15 copay for Trikafta. So this is only one we are sending out this time.  Will ask for referral for healthwell grant so we can get for free next time    [facility] Refill Coordination Note    Specialty Medication(s) to be Shipped:   CF/Pulmonary: -PULMOZYME (dornase alfa) 1mg /mL inhalation solution  -Trikafta    Other medication(s) to be shipped: Neb cup, 7%NS     Walter [name], DOB: [birth year]  Phone: [phone] (home)       All above HIPAA information was verified with patient.     Completed refill call assessment today to schedule patient's medication shipment from the [facility] [phone]).       Specialty medication(s) and dose(s) confirmed: Regimen is correct and unchanged.   Changes to medications: Gilman reports no changes reported at this time.  Changes to insurance: No  Questions for the pharmacist: No    The patient will receive a drug information handout for each medication shipped and additional FDA Medication Guides as required.      DISEASE/MEDICATION-SPECIFIC INFORMATION        For CF patients: CF Healthwell Grant Active? Yes - active but swept - VS, tx expired    ADHERENCE     Medication Adherence    Patient reported X missed doses in the last month:  0  Specialty Medication:  trikafta  Patient is on additional specialty medications:  Yes  Additional Specialty Medications:  pulmozyme  Patient Reported Additional Medication X Missed Doses in the Last Month:  0  Patient is on more than two specialty medications:  No  Any gaps in refill history greater than 2 weeks in the last 3 months:  no                      Refill Coordination    Has the Patients' Contact Information Changed:  No         MEDICARE PART B DOCUMENTATION     Pulmozyme 1mg /ml: Patient has 1 week nebulizer vials on hand.  trikafta: Patient has 1 week on hand.    SHIPPING     Shipping address confirmed in Epic.     Delivery Scheduled: Yes, Expected medication delivery date: 06/24/2018 via UPS or courier.     Medication will be delivered via Same Day Courier to the home address in [place].    Khamil Lamica Perlie Gold   [facility] Specialty Technician

## 2018-06-24 NOTE — Unmapped (Signed)
Walter [name] 's TRIKAFTA shipment will be delayed due to Refill too soon until 1/13 We have contacted the patient and left a message We will call the patient to reschedule the delivery upon resolution. We have confirmed the delivery date as N/A .

## 2018-06-24 NOTE — Unmapped (Signed)
Walter [name] 's Pulmozyme shipment will be delayed due to Insufficient inventory We have contacted the patient and communicated the delivery change to patient/caregiver We will reschedule the medication for the delivery date that the patient agreed upon. We have confirmed the delivery date as 06/27/18 with all of his medication including Trikafta.

## 2018-06-27 NOTE — Unmapped (Signed)
Walter [name] 's Trikafta and Pulmozyme shipment will be delayed due to Cost Increase We have contacted the patient and left a message We will call the patient to reschedule the delivery upon resolution. We have confirmed the delivery date as n/a .

## 2018-07-01 MED FILL — ELEXACAFTOR 100MG/TEZACAFTOR 50MG/IVACAFTOR 75MG (DAY) AND IVACAFTOR 150MG (NIGHT): 28 days supply | Qty: 84 | Fill #0 | Status: AC

## 2018-07-25 NOTE — Unmapped (Signed)
[facility] Refill Coordination Note  Medication: Trikafta, Pulmozyme    Unable to reach patient to schedule shipment for medication being filled at [facility]. Left voicemail on phone.  As this is the 3rd unsuccessful attempt to reach the patient, no additional phone call attempts will be made at this time.      Phone numbers attempted: [phone], [phone], [phone]   Last scheduled delivery: 07/01/2018, 03/15/2018    Please call the [facility] at [phone] (option [phone]) should you have any further questions.      Thanks,  [facility] Specialty Team

## 2018-07-26 NOTE — Unmapped (Signed)
Left message with Iyad also asking for a return call.     [name], RN  CF Nurse Coordinator   [phone]

## 2018-08-08 MED ORDER — ELEXACAFTOR 100 MG-TEZACAF 50MG-IVACAF 75MG(D)/IVACAF 150MG(N) TABLETS
ORAL_TABLET | 0 refills | 0 days | Status: CP
Start: 2018-08-08 — End: ?
  Filled 2018-08-08: qty 84, 28d supply, fill #0

## 2018-08-08 MED FILL — ELEXACAFTOR 100MG/TEZACAFTOR 50MG/IVACAFTOR 75MG (DAY) AND IVACAFTOR 150MG (NIGHT): 28 days supply | Qty: 84 | Fill #0 | Status: AC

## 2018-08-08 NOTE — Unmapped (Addendum)
Received a call from shared [facility] pharmacy. They state that Karan ran out of Trikafta over the weekend. They are requesting a new prescription to be sent out today. One-month supply provided and will follow-up with [name] about returning to clinic for labs and evaluation.       Spoke briefly with [name]. Confirmed he could come on Thursday, March 5th at 3pm. Will message front desk to schedule the appointment with Dr. Lurena Nida.    [name], RN  CF Nurse Coordinator   [phone]

## 2018-08-08 NOTE — Unmapped (Signed)
[facility] Refill Coordination Note    Specialty Medication(s) to be Shipped:   CF/Pulmonary: -Trikafta    Other medication(s) to be shipped: n/a     Walter [name], DOB: Dec 27, 1980  Phone: [phone] (home)       All above HIPAA information was verified with patient.     Completed refill call assessment today to schedule patient's medication shipment from the [facility] [phone]).       Specialty medication(s) and dose(s) confirmed: Regimen is correct and unchanged.   Changes to medications: Walter [name] reports no changes reported at this time.  Changes to insurance: No  Questions for the pharmacist: No    The patient will receive a drug information handout for each medication shipped and additional FDA Medication Guides as required.      DISEASE/MEDICATION-SPECIFIC INFORMATION        For CF patients: CF Healthwell Grant Active? Yes    ADHERENCE              MEDICARE PART B DOCUMENTATION     Not Applicable    SHIPPING     Shipping address confirmed in Epic.     Delivery Scheduled: Yes, Expected medication delivery date: 08/08/18 via UPS or courier.     Medication will be delivered via Same Day Courier to the home address in [employer] [place].    Walter [name]   [facility] Specialty Technician

## 2018-08-10 MED ORDER — PANTOPRAZOLE 40 MG TABLET,DELAYED RELEASE
ORAL_TABLET | Freq: Two times a day (BID) | ORAL | 3 refills | 0 days | Status: CP
Start: 2018-08-10 — End: 2019-08-10

## 2018-08-29 MED ORDER — TRAZODONE 50 MG TABLET
ORAL_TABLET | Freq: Every evening | ORAL | 2 refills | 0 days | Status: CP
Start: 2018-08-29 — End: 2018-09-28

## 2018-08-29 MED ORDER — GABAPENTIN 300 MG CAPSULE
ORAL_CAPSULE | 2 refills | 0 days | Status: CP
Start: 2018-08-29 — End: ?

## 2018-08-29 MED ORDER — MIRTAZAPINE 45 MG TABLET
ORAL_TABLET | Freq: Every evening | ORAL | 2 refills | 0.00000 days | Status: CP
Start: 2018-08-29 — End: 2018-09-28

## 2018-08-29 MED ORDER — OLANZAPINE 15 MG TABLET
ORAL_TABLET | Freq: Every evening | ORAL | 2 refills | 0 days | Status: CP
Start: 2018-08-29 — End: ?

## 2018-08-29 NOTE — Unmapped (Signed)
Patient walked in for an appointment that was scheduled with Dr. Lucile Shutters that should have been cancelled. Patient states that he is out of pended medications.

## 2018-09-02 NOTE — Unmapped (Signed)
Notified Dr. Lurena Nida by phone of Blain's passing. Provided him with best number to reach [place]. Will work on Publishing rights manager of his passing.

## 2018-09-02 NOTE — Unmapped (Signed)
Returned call from mother, notifying us that Nadir had passed away - apparently yesterday. Spoke with sister.    Was found unresponsive by roomates who called EMS. Apparently dead on scene of unknown cause.  Family spoke with [name] on Tuesday, and he was doing well from [facility] and CFRD standpoint.    Medical examiner involved, but family hasn't heard anything yet.    Condolences expressed, and offer to assist as needed.

## 2018-09-02 NOTE — Unmapped (Signed)
Error

## 2018-09-02 NOTE — Unmapped (Signed)
Disenrolled from [facility] Special[facility] Services    [facility]  [facility]   [phone] opt [phone]

## 2018-09-02 NOTE — Unmapped (Signed)
Patient's mother phoned the clinic nurse line. She wanted to inform Dr. Lurena Nida that her son passed away last night.     I expressed our condolences to her and their entire family from our team.     She know that notified the CF RN Coordinator.

## 2018-09-14 DEATH — deceased
